# Patient Record
Sex: Female | Born: 1965 | Race: White | Hispanic: No | Marital: Married | Smoking: Former smoker
Health system: Southern US, Community
[De-identification: ages and names within clinical notes are randomized; demographics above are authoritative.]

## PROBLEM LIST (undated history)

## (undated) DIAGNOSIS — I2510 Atherosclerotic heart disease of native coronary artery without angina pectoris: Secondary | ICD-10-CM

## (undated) DIAGNOSIS — B019 Varicella without complication: Secondary | ICD-10-CM

## (undated) DIAGNOSIS — F329 Major depressive disorder, single episode, unspecified: Secondary | ICD-10-CM

## (undated) DIAGNOSIS — R6882 Decreased libido: Secondary | ICD-10-CM

## (undated) DIAGNOSIS — E041 Nontoxic single thyroid nodule: Secondary | ICD-10-CM

## (undated) DIAGNOSIS — C4490 Unspecified malignant neoplasm of skin, unspecified: Secondary | ICD-10-CM

## (undated) DIAGNOSIS — T8859XA Other complications of anesthesia, initial encounter: Secondary | ICD-10-CM

## (undated) DIAGNOSIS — N83202 Unspecified ovarian cyst, left side: Secondary | ICD-10-CM

## (undated) DIAGNOSIS — F419 Anxiety disorder, unspecified: Secondary | ICD-10-CM

## (undated) DIAGNOSIS — E785 Hyperlipidemia, unspecified: Secondary | ICD-10-CM

## (undated) DIAGNOSIS — K219 Gastro-esophageal reflux disease without esophagitis: Secondary | ICD-10-CM

## (undated) DIAGNOSIS — R87810 Cervical high risk human papillomavirus (HPV) DNA test positive: Secondary | ICD-10-CM

## (undated) DIAGNOSIS — G8929 Other chronic pain: Secondary | ICD-10-CM

## (undated) DIAGNOSIS — R87610 Atypical squamous cells of undetermined significance on cytologic smear of cervix (ASC-US): Secondary | ICD-10-CM

## (undated) DIAGNOSIS — Z8719 Personal history of other diseases of the digestive system: Secondary | ICD-10-CM

## (undated) DIAGNOSIS — F32A Depression, unspecified: Secondary | ICD-10-CM

## (undated) HISTORY — PX: WISDOM TOOTH EXTRACTION: SHX21

## (undated) HISTORY — DX: Major depressive disorder, single episode, unspecified: F32.9

## (undated) HISTORY — DX: Decreased libido: R68.82

## (undated) HISTORY — DX: Atypical squamous cells of undetermined significance on cytologic smear of cervix (ASC-US): R87.810

## (undated) HISTORY — DX: Atypical squamous cells of undetermined significance on cytologic smear of cervix (ASC-US): R87.610

## (undated) HISTORY — DX: Nontoxic single thyroid nodule: E04.1

## (undated) HISTORY — PX: BLADDER SURGERY: SHX569

## (undated) HISTORY — DX: Varicella without complication: B01.9

## (undated) HISTORY — DX: Other chronic pain: G89.29

## (undated) HISTORY — DX: Anxiety disorder, unspecified: F41.9

## (undated) HISTORY — PX: OOPHORECTOMY: SHX86

## (undated) HISTORY — PX: ABDOMINAL HYSTERECTOMY: SHX81

## (undated) HISTORY — DX: Gastro-esophageal reflux disease without esophagitis: K21.9

## (undated) HISTORY — DX: Unspecified ovarian cyst, left side: N83.202

## (undated) HISTORY — DX: Atherosclerotic heart disease of native coronary artery without angina pectoris: I25.10

## (undated) HISTORY — PX: COLONOSCOPY: SHX174

## (undated) HISTORY — DX: Depression, unspecified: F32.A

## (undated) HISTORY — DX: Personal history of other diseases of the digestive system: Z87.19

## (undated) HISTORY — DX: Unspecified malignant neoplasm of skin, unspecified: C44.90

## (undated) HISTORY — DX: Hyperlipidemia, unspecified: E78.5

## (undated) HISTORY — PX: BREAST BIOPSY: SHX20

---

## 2002-07-31 HISTORY — PX: APPENDECTOMY: SHX54

## 2004-07-27 ENCOUNTER — Ambulatory Visit: Payer: Self-pay | Admitting: Internal Medicine

## 2004-09-05 ENCOUNTER — Ambulatory Visit: Payer: Self-pay | Admitting: Internal Medicine

## 2004-09-16 ENCOUNTER — Ambulatory Visit: Payer: Self-pay | Admitting: Internal Medicine

## 2005-03-16 ENCOUNTER — Ambulatory Visit: Payer: Self-pay | Admitting: Unknown Physician Specialty

## 2005-04-20 ENCOUNTER — Ambulatory Visit: Payer: Self-pay | Admitting: Unknown Physician Specialty

## 2005-05-04 ENCOUNTER — Encounter: Payer: Self-pay | Admitting: Unknown Physician Specialty

## 2005-05-31 ENCOUNTER — Encounter: Payer: Self-pay | Admitting: Unknown Physician Specialty

## 2005-06-16 ENCOUNTER — Encounter: Payer: Self-pay | Admitting: Unknown Physician Specialty

## 2005-06-30 ENCOUNTER — Encounter: Payer: Self-pay | Admitting: Unknown Physician Specialty

## 2005-07-31 ENCOUNTER — Encounter: Payer: Self-pay | Admitting: Unknown Physician Specialty

## 2005-08-30 ENCOUNTER — Ambulatory Visit: Payer: Self-pay | Admitting: Unknown Physician Specialty

## 2005-08-31 ENCOUNTER — Encounter: Payer: Self-pay | Admitting: Unknown Physician Specialty

## 2005-09-28 ENCOUNTER — Encounter: Payer: Self-pay | Admitting: Unknown Physician Specialty

## 2006-01-11 ENCOUNTER — Ambulatory Visit: Payer: Self-pay | Admitting: Urology

## 2006-02-01 ENCOUNTER — Ambulatory Visit: Payer: Self-pay | Admitting: Unknown Physician Specialty

## 2006-02-12 ENCOUNTER — Ambulatory Visit: Payer: Self-pay | Admitting: Unknown Physician Specialty

## 2006-06-10 ENCOUNTER — Other Ambulatory Visit: Payer: Self-pay

## 2006-06-10 ENCOUNTER — Inpatient Hospital Stay: Payer: Self-pay | Admitting: Internal Medicine

## 2006-07-15 ENCOUNTER — Ambulatory Visit: Payer: Self-pay | Admitting: Internal Medicine

## 2006-10-04 ENCOUNTER — Encounter: Payer: Self-pay | Admitting: Unknown Physician Specialty

## 2006-10-30 ENCOUNTER — Encounter: Payer: Self-pay | Admitting: Unknown Physician Specialty

## 2006-11-20 ENCOUNTER — Ambulatory Visit: Payer: Self-pay | Admitting: Pain Medicine

## 2006-11-26 ENCOUNTER — Ambulatory Visit: Payer: Self-pay | Admitting: Pain Medicine

## 2006-11-29 ENCOUNTER — Encounter: Payer: Self-pay | Admitting: Unknown Physician Specialty

## 2006-12-25 ENCOUNTER — Ambulatory Visit: Payer: Self-pay | Admitting: Pain Medicine

## 2006-12-31 ENCOUNTER — Ambulatory Visit: Payer: Self-pay | Admitting: Pain Medicine

## 2007-01-31 ENCOUNTER — Ambulatory Visit: Payer: Self-pay | Admitting: Pain Medicine

## 2007-02-04 ENCOUNTER — Ambulatory Visit: Payer: Self-pay | Admitting: Pain Medicine

## 2007-02-22 ENCOUNTER — Emergency Department: Payer: Self-pay | Admitting: Emergency Medicine

## 2007-03-07 ENCOUNTER — Ambulatory Visit: Payer: Self-pay | Admitting: Unknown Physician Specialty

## 2007-03-08 ENCOUNTER — Ambulatory Visit: Payer: Self-pay | Admitting: Unknown Physician Specialty

## 2007-03-14 ENCOUNTER — Ambulatory Visit: Payer: Self-pay | Admitting: Pain Medicine

## 2007-03-18 ENCOUNTER — Ambulatory Visit: Payer: Self-pay | Admitting: Pain Medicine

## 2008-04-14 ENCOUNTER — Ambulatory Visit: Payer: Self-pay | Admitting: Unknown Physician Specialty

## 2008-04-17 ENCOUNTER — Ambulatory Visit: Payer: Self-pay | Admitting: Unknown Physician Specialty

## 2008-11-17 ENCOUNTER — Ambulatory Visit: Payer: Self-pay | Admitting: Unknown Physician Specialty

## 2009-04-01 ENCOUNTER — Ambulatory Visit: Payer: Self-pay | Admitting: Pain Medicine

## 2009-04-12 ENCOUNTER — Ambulatory Visit: Payer: Self-pay | Admitting: Pain Medicine

## 2009-05-13 ENCOUNTER — Ambulatory Visit: Payer: Self-pay | Admitting: Pain Medicine

## 2009-05-17 ENCOUNTER — Ambulatory Visit: Payer: Self-pay | Admitting: Pain Medicine

## 2009-06-10 ENCOUNTER — Ambulatory Visit: Payer: Self-pay | Admitting: Pain Medicine

## 2009-06-14 ENCOUNTER — Ambulatory Visit: Payer: Self-pay | Admitting: Internal Medicine

## 2009-06-16 ENCOUNTER — Ambulatory Visit: Payer: Self-pay | Admitting: Unknown Physician Specialty

## 2009-06-18 ENCOUNTER — Ambulatory Visit: Payer: Self-pay | Admitting: Unknown Physician Specialty

## 2009-06-30 ENCOUNTER — Encounter: Admission: RE | Admit: 2009-06-30 | Discharge: 2009-06-30 | Payer: Self-pay | Admitting: Unknown Physician Specialty

## 2009-06-30 ENCOUNTER — Ambulatory Visit: Payer: Self-pay | Admitting: Pain Medicine

## 2009-07-06 ENCOUNTER — Encounter: Admission: RE | Admit: 2009-07-06 | Discharge: 2009-07-06 | Payer: Self-pay | Admitting: Unknown Physician Specialty

## 2009-07-14 ENCOUNTER — Encounter: Admission: RE | Admit: 2009-07-14 | Discharge: 2009-07-14 | Payer: Self-pay | Admitting: Unknown Physician Specialty

## 2009-08-09 ENCOUNTER — Ambulatory Visit: Payer: Self-pay | Admitting: Pain Medicine

## 2009-09-02 ENCOUNTER — Ambulatory Visit: Payer: Self-pay | Admitting: Pain Medicine

## 2009-09-08 ENCOUNTER — Ambulatory Visit: Payer: Self-pay | Admitting: Pain Medicine

## 2009-10-05 ENCOUNTER — Ambulatory Visit: Payer: Self-pay | Admitting: Pain Medicine

## 2009-11-04 ENCOUNTER — Ambulatory Visit: Payer: Self-pay | Admitting: Pain Medicine

## 2009-11-24 ENCOUNTER — Ambulatory Visit: Payer: Self-pay | Admitting: Pain Medicine

## 2009-12-23 ENCOUNTER — Ambulatory Visit: Payer: Self-pay | Admitting: Pain Medicine

## 2010-02-01 ENCOUNTER — Ambulatory Visit: Payer: Self-pay | Admitting: Pain Medicine

## 2010-02-21 ENCOUNTER — Ambulatory Visit: Payer: Self-pay | Admitting: Pain Medicine

## 2010-03-24 ENCOUNTER — Ambulatory Visit: Payer: Self-pay | Admitting: Pain Medicine

## 2010-04-18 ENCOUNTER — Ambulatory Visit: Payer: Self-pay | Admitting: Pain Medicine

## 2010-05-31 ENCOUNTER — Ambulatory Visit: Payer: Self-pay | Admitting: Pain Medicine

## 2010-06-20 ENCOUNTER — Ambulatory Visit: Payer: Self-pay | Admitting: Unknown Physician Specialty

## 2010-07-20 ENCOUNTER — Ambulatory Visit: Payer: Self-pay | Admitting: Pain Medicine

## 2011-07-18 ENCOUNTER — Ambulatory Visit: Payer: Self-pay | Admitting: Internal Medicine

## 2011-07-19 ENCOUNTER — Ambulatory Visit: Payer: Self-pay | Admitting: Internal Medicine

## 2011-07-26 ENCOUNTER — Other Ambulatory Visit: Payer: Self-pay | Admitting: Internal Medicine

## 2011-07-28 ENCOUNTER — Other Ambulatory Visit: Payer: Self-pay | Admitting: Internal Medicine

## 2011-07-28 DIAGNOSIS — R928 Other abnormal and inconclusive findings on diagnostic imaging of breast: Secondary | ICD-10-CM

## 2011-08-07 ENCOUNTER — Ambulatory Visit
Admission: RE | Admit: 2011-08-07 | Discharge: 2011-08-07 | Disposition: A | Discharge status: Home / Self Care | Payer: 59 | Source: Ambulatory Visit | Attending: Internal Medicine | Admitting: Internal Medicine

## 2011-08-07 ENCOUNTER — Other Ambulatory Visit: Payer: Self-pay | Admitting: Internal Medicine

## 2011-08-07 DIAGNOSIS — R928 Other abnormal and inconclusive findings on diagnostic imaging of breast: Secondary | ICD-10-CM

## 2011-11-07 ENCOUNTER — Encounter: Payer: Self-pay | Admitting: Anesthesiology

## 2011-11-29 ENCOUNTER — Encounter: Payer: Self-pay | Admitting: Anesthesiology

## 2012-01-23 DIAGNOSIS — K5090 Crohn's disease, unspecified, without complications: Secondary | ICD-10-CM | POA: Insufficient documentation

## 2012-07-12 DIAGNOSIS — M25559 Pain in unspecified hip: Secondary | ICD-10-CM | POA: Insufficient documentation

## 2012-07-12 DIAGNOSIS — M7150 Other bursitis, not elsewhere classified, unspecified site: Secondary | ICD-10-CM | POA: Insufficient documentation

## 2012-07-12 HISTORY — DX: Pain in unspecified hip: M25.559

## 2012-07-22 ENCOUNTER — Emergency Department: Payer: Self-pay | Admitting: Emergency Medicine

## 2012-07-22 LAB — ACETAMINOPHEN LEVEL: Acetaminophen: 6 ug/mL — ABNORMAL LOW

## 2012-07-22 LAB — CBC WITH DIFFERENTIAL/PLATELET
Basophil #: 0 10*3/uL (ref 0.0–0.1)
Basophil %: 0.4 %
Eosinophil %: 0.9 %
HCT: 40.1 % (ref 35.0–47.0)
HGB: 13.2 g/dL (ref 12.0–16.0)
Lymphocyte #: 1.4 10*3/uL (ref 1.0–3.6)
Lymphocyte %: 17.5 %
MCV: 91 fL (ref 80–100)
Monocyte %: 7.6 %
Neutrophil #: 5.7 10*3/uL (ref 1.4–6.5)
RDW: 13.1 % (ref 11.5–14.5)
WBC: 7.8 10*3/uL (ref 3.6–11.0)

## 2012-07-22 LAB — COMPREHENSIVE METABOLIC PANEL
Albumin: 4.1 g/dL (ref 3.4–5.0)
Alkaline Phosphatase: 77 U/L (ref 50–136)
Anion Gap: 7 (ref 7–16)
BUN: 14 mg/dL (ref 7–18)
Calcium, Total: 8.9 mg/dL (ref 8.5–10.1)
EGFR (Non-African Amer.): >60
Glucose: 96 mg/dL (ref 65–99)
Osmolality: 274 (ref 275–301)
Potassium: 3.8 mmol/L (ref 3.5–5.1)
SGOT(AST): 27 U/L (ref 15–37)
Sodium: 137 mmol/L (ref 136–145)

## 2012-07-22 LAB — SALICYLATE LEVEL: Salicylates, Serum: <1.7 mg/dL

## 2012-07-22 LAB — DRUG SCREEN, URINE
Barbiturates, Ur Screen: NEGATIVE (ref ?–200)
Cannabinoid 50 Ng, Ur ~~LOC~~: NEGATIVE (ref ?–50)
Cocaine Metabolite,Ur ~~LOC~~: NEGATIVE (ref ?–300)
Opiate, Ur Screen: POSITIVE (ref ?–300)
Phencyclidine (PCP) Ur S: NEGATIVE (ref ?–25)
Tricyclic, Ur Screen: NEGATIVE (ref ?–1000)

## 2012-07-22 LAB — ETHANOL: Ethanol: <3 mg/dL

## 2012-08-12 ENCOUNTER — Other Ambulatory Visit: Payer: Self-pay | Admitting: Internal Medicine

## 2012-08-12 DIAGNOSIS — Z1231 Encounter for screening mammogram for malignant neoplasm of breast: Secondary | ICD-10-CM

## 2012-08-22 ENCOUNTER — Ambulatory Visit: Payer: Self-pay | Admitting: Cardiology

## 2012-09-05 ENCOUNTER — Ambulatory Visit
Admission: RE | Admit: 2012-09-05 | Discharge: 2012-09-05 | Disposition: A | Discharge status: Home / Self Care | Payer: 59 | Source: Ambulatory Visit | Attending: Internal Medicine | Admitting: Internal Medicine

## 2012-09-05 DIAGNOSIS — Z1231 Encounter for screening mammogram for malignant neoplasm of breast: Secondary | ICD-10-CM

## 2012-09-25 ENCOUNTER — Ambulatory Visit: Payer: Self-pay | Admitting: Internal Medicine

## 2012-09-26 ENCOUNTER — Ambulatory Visit: Payer: Self-pay | Admitting: Internal Medicine

## 2012-10-22 ENCOUNTER — Ambulatory Visit: Payer: Self-pay | Admitting: Internal Medicine

## 2013-07-18 DIAGNOSIS — G8929 Other chronic pain: Secondary | ICD-10-CM | POA: Insufficient documentation

## 2013-07-18 DIAGNOSIS — M533 Sacrococcygeal disorders, not elsewhere classified: Secondary | ICD-10-CM | POA: Insufficient documentation

## 2013-12-12 ENCOUNTER — Other Ambulatory Visit: Payer: Self-pay

## 2013-12-12 DIAGNOSIS — Z1231 Encounter for screening mammogram for malignant neoplasm of breast: Secondary | ICD-10-CM

## 2013-12-29 ENCOUNTER — Encounter (INDEPENDENT_AMBULATORY_CARE_PROVIDER_SITE_OTHER): Payer: Self-pay

## 2013-12-29 ENCOUNTER — Ambulatory Visit
Admission: RE | Admit: 2013-12-29 | Discharge: 2013-12-29 | Disposition: A | Discharge status: Home / Self Care | Payer: 59 | Source: Ambulatory Visit

## 2013-12-29 DIAGNOSIS — Z1231 Encounter for screening mammogram for malignant neoplasm of breast: Secondary | ICD-10-CM

## 2014-02-25 ENCOUNTER — Ambulatory Visit: Payer: Self-pay | Admitting: Unknown Physician Specialty

## 2014-03-30 DIAGNOSIS — Z8371 Family history of colonic polyps: Secondary | ICD-10-CM | POA: Insufficient documentation

## 2014-03-30 DIAGNOSIS — K5000 Crohn's disease of small intestine without complications: Secondary | ICD-10-CM | POA: Insufficient documentation

## 2014-05-19 ENCOUNTER — Inpatient Hospital Stay: Payer: Self-pay | Admitting: Internal Medicine

## 2014-05-19 LAB — BASIC METABOLIC PANEL
Anion Gap: 4 — ABNORMAL LOW (ref 7–16)
BUN: 12 mg/dL (ref 7–18)
CALCIUM: 7.2 mg/dL — AB (ref 8.5–10.1)
CHLORIDE: 114 mmol/L — AB (ref 98–107)
CREATININE: 0.79 mg/dL (ref 0.60–1.30)
Co2: 27 mmol/L (ref 21–32)
EGFR (African American): >60
EGFR (Non-African Amer.): >60
GLUCOSE: 90 mg/dL (ref 65–99)
OSMOLALITY: 288 (ref 275–301)
POTASSIUM: 3.8 mmol/L (ref 3.5–5.1)
Sodium: 145 mmol/L (ref 136–145)

## 2014-05-19 LAB — URINALYSIS, COMPLETE
BILIRUBIN, UR: NEGATIVE
Bacteria: NONE SEEN
Blood: NEGATIVE
Glucose,UR: NEGATIVE mg/dL (ref 0–75)
KETONE: NEGATIVE
LEUKOCYTE ESTERASE: NEGATIVE
NITRITE: NEGATIVE
PH: 5 (ref 4.5–8.0)
PROTEIN: NEGATIVE
Specific Gravity: 1.029 (ref 1.003–1.030)
WBC UR: 1 /HPF (ref 0–5)

## 2014-05-19 LAB — CBC WITH DIFFERENTIAL/PLATELET
Basophil #: 0 10*3/uL (ref 0.0–0.1)
Basophil %: 0.2 %
EOS ABS: 0 10*3/uL (ref 0.0–0.7)
Eosinophil %: 0.2 %
HCT: 36.9 % (ref 35.0–47.0)
HGB: 12.1 g/dL (ref 12.0–16.0)
LYMPHS ABS: 1.8 10*3/uL (ref 1.0–3.6)
Lymphocyte %: 12.5 %
MCH: 30.3 pg (ref 26.0–34.0)
MCHC: 32.9 g/dL (ref 32.0–36.0)
MCV: 92 fL (ref 80–100)
MONO ABS: 0.6 x10 3/mm (ref 0.2–0.9)
Monocyte %: 4.3 %
Neutrophil #: 11.8 10*3/uL — ABNORMAL HIGH (ref 1.4–6.5)
Neutrophil %: 82.8 %
Platelet: 261 10*3/uL (ref 150–440)
RBC: 4.01 10*6/uL (ref 3.80–5.20)
RDW: 12.3 % (ref 11.5–14.5)
WBC: 14.3 10*3/uL — ABNORMAL HIGH (ref 3.6–11.0)

## 2014-05-19 LAB — PROTIME-INR
INR: 1.1
Prothrombin Time: 14 secs (ref 11.5–14.7)

## 2014-05-19 LAB — D-DIMER(ARMC): D-DIMER: 512 ng/mL

## 2014-05-19 LAB — TROPONIN I
TROPONIN-I: 6.5 ng/mL — AB
Troponin-I: 2.6 ng/mL — ABNORMAL HIGH
Troponin-I: 6.3 ng/mL — ABNORMAL HIGH

## 2014-05-19 LAB — CK-MB
CK-MB: 12.8 ng/mL — AB (ref 0.5–3.6)
CK-MB: 13.1 ng/mL — ABNORMAL HIGH (ref 0.5–3.6)

## 2014-05-19 LAB — APTT: ACTIVATED PTT: 29.5 s (ref 23.6–35.9)

## 2014-05-19 LAB — CK TOTAL AND CKMB (NOT AT ARMC)
CK, TOTAL: 84 U/L
CK-MB: 4.6 ng/mL — ABNORMAL HIGH (ref 0.5–3.6)

## 2014-05-20 LAB — TROPONIN I: Troponin-I: 4.2 ng/mL — ABNORMAL HIGH

## 2014-05-20 LAB — CBC WITH DIFFERENTIAL/PLATELET
BASOS PCT: 0.3 %
Basophil #: 0 10*3/uL (ref 0.0–0.1)
EOS ABS: 0 10*3/uL (ref 0.0–0.7)
Eosinophil %: 0.4 %
HCT: 36.1 % (ref 35.0–47.0)
HGB: 11.8 g/dL — AB (ref 12.0–16.0)
Lymphocyte #: 2.1 10*3/uL (ref 1.0–3.6)
Lymphocyte %: 18.1 %
MCH: 30.3 pg (ref 26.0–34.0)
MCHC: 32.7 g/dL (ref 32.0–36.0)
MCV: 93 fL (ref 80–100)
MONO ABS: 0.9 x10 3/mm (ref 0.2–0.9)
MONOS PCT: 7.3 %
NEUTROS ABS: 8.6 10*3/uL — AB (ref 1.4–6.5)
Neutrophil %: 73.9 %
PLATELETS: 238 10*3/uL (ref 150–440)
RBC: 3.9 10*6/uL (ref 3.80–5.20)
RDW: 12.8 % (ref 11.5–14.5)
WBC: 11.6 10*3/uL — AB (ref 3.6–11.0)

## 2014-05-20 LAB — LIPID PANEL
CHOLESTEROL: 161 mg/dL (ref 0–200)
HDL: 32 mg/dL — AB (ref 40–60)
Ldl Cholesterol, Calc: 95 mg/dL (ref 0–100)
TRIGLYCERIDES: 171 mg/dL (ref 0–200)
VLDL Cholesterol, Calc: 34 mg/dL (ref 5–40)

## 2014-08-17 DIAGNOSIS — M65911 Unspecified synovitis and tenosynovitis, right shoulder: Secondary | ICD-10-CM

## 2014-08-17 DIAGNOSIS — M65811 Other synovitis and tenosynovitis, right shoulder: Secondary | ICD-10-CM

## 2014-08-17 HISTORY — DX: Other synovitis and tenosynovitis, right shoulder: M65.811

## 2014-08-17 HISTORY — DX: Unspecified synovitis and tenosynovitis, right shoulder: M65.911

## 2014-11-21 NOTE — Discharge Summary (Signed)
PATIENT NAME:  Carolyn Esparza, Carolyn Esparza MR#:  [id number] DATE OF BIRTH:  [birth year]  DATE OF ADMISSION:  05/19/2014 DATE OF DISCHARGE:  05/20/2014  DISCHARGE DIAGNOSES: 1.  Acute non-ST elevation myocardial infarction.  2.  Hyperlipidemia.  3.  Hypotension following colonoscopy.   CHIEF COMPLAINT:  Hypotension.   HOSPITAL COURSE:  Carolyn Esparza is a 49 year old female with a history of hyperlipidemia and previous Crohn disease, who underwent a colonoscopy yesterday, but after colonoscopy, the patient developed significant hypotension with a blood pressure of 80/50 and was given IV fluids. EKG did show ST segment depression of 2 mm. The patient denied any chest pain, palpitations, orthopnea, or PND. With fluids, blood pressure did come up. Subsequent cardiac catheterization done by Dr. 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 Coronary circulation was right dominant. Mid LAD showed luminal irregularities. The mid circumflex showed minor luminal irregularities, and mid ramus intermedius showed minor luminal irregularities. EF by contrast ventriculography was 55%. The patient did have elevated cardiac enzymes with troponin of 2.6, 6.3, and 6.5 respectively. She did have some urinary retention and needed in and out catheterization. However, she remained chest pain-free, blood pressure was stable, and she was discharged in stable condition on the following medications.   DISCHARGE MEDICATIONS:  Pantoprazole 20 mg once a day, Plavix 75 mg once a day, aspirin 325 mg a day, rosuvastatin 10 mg once a day, Wellbutrin XL 150 mg every 24 hours, fentanyl patch at bedtime, Minivelle patch 0.075 mg every 24 hours transdermal 2 times a week on Wednesdays and Saturdays, Colace 100 mg p.o. b.i.d. as needed. The patient is advised to discontinue her Celebrex completely and advised to call back with any questions or concerns.   FOLLOWUP:  She has been advised to follow up with Dr. Laurelyn Sickle office in 1 to 2 weeks' time.    TOTAL TIME SPENT ON DISCHARGING THE PATIENT:  35 minutes.    ____________________________ Tracie Harrier, MD vh:nb D: 05/20/2014 13:24:04 ET T: 05/21/2014 04:54:48 ET JOB#: [id number]  cc: Tracie Harrier, MD, <Dictator> Tracie Harrier MD ELECTRONICALLY SIGNED 05/26/2014 15:58

## 2014-11-21 NOTE — H&P (Signed)
PATIENT NAME:  Carolyn Esparza, Carolyn Esparza MR#:  [id number] DATE OF BIRTH:  07-28-1966  DATE OF ADMISSION:  05/19/2014  PRIMARY CARE PHYSICIAN:  Dr. Ginette Pitman.    REFERRING PHYSICIAN:  Dr. Humphrey Rolls, cardiologist.    CHIEF COMPLAINT:  Hypotension.   HISTORY OF PRESENT ILLNESS: This is a 49 year old Caucasian female with a history of hyperlipidemia, was sent by GI physician due to hypotension. The patient is alert, awake, oriented. According to Dr. Humphrey Rolls and patient, the patient was scheduled to have a colonoscopy today, but after colonoscopy the patient developed hypotension at 80/50. The patient was given IV fluid support. EKG was done which shows ST depression about 2 mm, but the patient denied any chest pain, palpitation. No orthopnea, nocturnal dyspnea. The patient was started on wide open IV fluid and was then transferred to the ED for possible cardiac catheterization.  The patient denies any other symptoms.   PAST MEDICAL HISTORY: Hyperlipidemia.   SOCIAL HISTORY: Quit smoking years ago. No alcohol drinking or illicit drugs.   FAMILY HISTORY: Diabetes and heart disease.   HOME MEDICATIONS: Pantoprazole 20 mg p.o. daily, MiraLax p.o. powder 17 grams p.o. daily, fentanyl patch 25 mcg per hour transdermal film 1 patch every 3 days, Cymbalta 30 mg p.o. b.i.d., Colace 100 mg p.o. b.i.d., Celebrex 200 mg p.o. b.i.d., Percocet 325 mg/10 mg p.o. every 6 hours p.r.n.   REVIEW OF SYSTEMS:    CONSTITUTIONAL: The patient denies any fever or chills. No headache or dizziness. No weakness.  EYES: No double vision or blurry vision.  EARS, NOSE, AND THROAT: No postnasal drip, slurred speech, or dysphagia. No headache, but has had mild dizziness. No weakness.  CARDIOVASCULAR: No chest pain, palpitation, orthopnea, nocturnal dyspnea. No leg edema.  PULMONARY: No cough, sputum, shortness of breath, or hemoptysis.  GASTROINTESTINAL: No abdominal pain, nausea, vomiting, diarrhea. No melena or bloody stool.  GENITOURINARY: No  dysuria, hematuria, or incontinence.  SKIN: No rash or jaundice.  NEUROLOGIC: No syncope, loss of consciousness, or seizure.  ENDOCRINE: No polyuria, polydipsia, heat or cold intolerance.  HEMATOLOGY: No easy bruising or bleeding.   PHYSICAL EXAMINATION:  VITAL SIGNS:  Blood pressure 99/50, pulse 103, respirations 18.  GENERAL: The patient is alert, awake, oriented, in no acute distress.  HEENT: Pupils round, equal, and reactive to light and accommodation. Moist oral mucosa. Clear oropharynx.  NECK: Supple. No JVD or carotid bruits. No lymphadenopathy.  No thyromegaly.  CARDIOVASCULAR: S1, S2 regular rate and rhythm. No murmurs or gallops.  PULMONARY: Bilateral air entry. No wheezing or rales.  No use of accessory muscle to breathe.  ABDOMEN: Soft. No distention or tenderness. No organomegaly. Bowel sounds present.  EXTREMITIES: No edema, clubbing, or cyanosis. No calf tenderness. Bilateral pedal pulses present.  SKIN: No rash or jaundice.  NEUROLOGIC: A and O x 3. No focal defects. Power 5 out of 5. Sensation intact.   LABORATORY DATA: Laboratories were ordered, no laboratories so far.   IMPRESSION:  1.  Hypotension.    2.  Abnormal electrocardiogram with ST depression possibly due to temporary ischemia due to hypotension.  3.  Hyperlipidemia.   PLAN OF TREATMENT:  1.  The patient is going to get cardiac catheterization by Dr. Humphrey Rolls now. We will follow up with Dr. Humphrey Rolls for further recommendation and we will get routine laboratories. We will follow up laboratories including CBC and BMP. In addition we will follow up the troponin level.  2.  I discussed the patient's condition and plan of treatment  with the patient and the patient's husband, in addition discussed with Dr. Humphrey Rolls.    CODE STATUS:  The patient is a full code.   TIME SPENT: About 46 minutes.    ____________________________ Demetrios Loll, MD qc:bu D: 05/19/2014 13:06:20 ET T: 05/19/2014 13:25:33 ET JOB#: [id number]  cc: Demetrios Loll, MD, <Dictator> Demetrios Loll MD ELECTRONICALLY SIGNED 05/19/2014 15:14

## 2014-11-21 NOTE — Consult Note (Signed)
PATIENT NAME:  Carolyn Esparza, Carolyn Esparza MR#:  [id number] DATE OF BIRTH:  April 01, 1966  DATE OF CONSULTATION:  05/19/2014  REFERRING PHYSICIAN:   CONSULTING PHYSICIAN:  Dionisio David, MD  HISTORY OF PRESENT ILLNESS: This is a 49 year old white female who was having colonoscopy at [facility] in [place]. She developed some hypotension post colonoscopy. Blood pressure dropped to 80/50. She was given IV fluids. EKG was done and EKG showed ST depression, about 2 mm, in II, III and aVF as well as V4 to V6 and questionable 0.5 mm ST elevation in I and aVL. She was not having any chest pain. She was started on wide-open IV fluid and transferred to Emergency Room for possible cardiac catheterization. Throughout she remained alert and oriented. There was no chest pain.   PAST MEDICAL HISTORY: History of hyperlipidemia. No history of hypertension or diabetes.   REVIEW OF SYSTEMS: When she was taking GoLYTELY last night for preparation for colonoscopy, she had some nausea and vomiting and some abdominal pain, but no chest pain.   PHYSICAL EXAMINATION: VITAL SIGNS: Right now her blood pressure is 99/50, pulse is 103 and respirations 18.  NECK: No JVD.  LUNGS: Clear.  HEART: Regular rate and rhythm. Normal S1, S2. No audible murmur.  ABDOMEN: Soft, nontender, positive bowel sounds.  EXTREMITIES: No pedal edema.  NEUROLOGIC: She appears to be intact.   DIAGNOSTIC DATA: EKG shows, as mentioned, sinus tachycardia, inferolateral ischemic ST-T changes.   ASSESSMENT AND RECOMMENDATIONS: Advise labs as well as doing cardiac catheterization, labs with troponins and cardiac enzymes. Will discuss with either Dr. Clayborn Bigness or Dr. Saralyn Pilar for possible angioplasty if needed.  ____________________________ Dionisio David, MD sak:sb D: 05/19/2014 11:19:57 ET T: 05/19/2014 11:33:46 ET JOB#: [id number]  cc: Dionisio David, MD, <Dictator> Dionisio David MD ELECTRONICALLY SIGNED 05/22/2014 12:50

## 2015-02-05 DIAGNOSIS — I2510 Atherosclerotic heart disease of native coronary artery without angina pectoris: Secondary | ICD-10-CM | POA: Insufficient documentation

## 2015-03-25 DIAGNOSIS — Z0289 Encounter for other administrative examinations: Secondary | ICD-10-CM | POA: Insufficient documentation

## 2015-05-26 LAB — HM MAMMOGRAPHY

## 2015-12-01 ENCOUNTER — Ambulatory Visit: Payer: Self-pay | Admitting: Family Medicine

## 2015-12-09 ENCOUNTER — Ambulatory Visit: Payer: Self-pay | Admitting: Family Medicine

## 2015-12-13 ENCOUNTER — Encounter: Payer: Self-pay | Admitting: Family Medicine

## 2015-12-13 ENCOUNTER — Ambulatory Visit (INDEPENDENT_AMBULATORY_CARE_PROVIDER_SITE_OTHER): Payer: 59 | Admitting: Family Medicine

## 2015-12-13 VITALS — BP 122/84 | HR 75 | Temp 98.1°F | Ht 62.5 in | Wt 156.0 lb

## 2015-12-13 DIAGNOSIS — G8929 Other chronic pain: Secondary | ICD-10-CM

## 2015-12-13 DIAGNOSIS — R6889 Other general symptoms and signs: Secondary | ICD-10-CM | POA: Diagnosis not present

## 2015-12-13 DIAGNOSIS — E785 Hyperlipidemia, unspecified: Secondary | ICD-10-CM

## 2015-12-13 DIAGNOSIS — F329 Major depressive disorder, single episode, unspecified: Secondary | ICD-10-CM

## 2015-12-13 DIAGNOSIS — I2510 Atherosclerotic heart disease of native coronary artery without angina pectoris: Secondary | ICD-10-CM | POA: Diagnosis not present

## 2015-12-13 DIAGNOSIS — Z0001 Encounter for general adult medical examination with abnormal findings: Secondary | ICD-10-CM | POA: Diagnosis not present

## 2015-12-13 DIAGNOSIS — F32A Depression, unspecified: Secondary | ICD-10-CM

## 2015-12-13 NOTE — Patient Instructions (Signed)
It was nice to see you today.  Consider aspirin 81 mg daily (enteric coated).  Consider taking your lipitor daily.  Continue all of your other medications.   Follow up:  6 months - 1 year.  Take care  Dr. Lacinda Axon  Health Maintenance, Female Adopting a healthy lifestyle and getting preventive care can go a long way to promote health and wellness. Talk with your health care provider about what schedule of regular examinations is right for you. This is a good chance for you to check in with your provider about disease prevention and staying healthy. In between checkups, there are plenty of things you can do on your own. Experts have done a lot of research about which lifestyle changes and preventive measures are most likely to keep you healthy. Ask your health care provider for more information. WEIGHT AND DIET  Eat a healthy diet  Be sure to include plenty of vegetables, fruits, low-fat dairy products, and lean protein.  Do not eat a lot of foods high in solid fats, added sugars, or salt.  Get regular exercise. This is one of the most important things you can do for your health.  Most adults should exercise for at least 150 minutes each week. The exercise should increase your heart rate and make you sweat (moderate-intensity exercise).  Most adults should also do strengthening exercises at least twice a week. This is in addition to the moderate-intensity exercise.  Maintain a healthy weight  Body mass index (BMI) is a measurement that can be used to identify possible weight problems. It estimates body fat based on height and weight. Your health care provider can help determine your BMI and help you achieve or maintain a healthy weight.  For females 80 years of age and older:   A BMI below 18.5 is considered underweight.  A BMI of 18.5 to 24.9 is normal.  A BMI of 25 to 29.9 is considered overweight.  A BMI of 30 and above is considered obese.  Watch levels of cholesterol and  blood lipids  You should start having your blood tested for lipids and cholesterol at 50 years of age, then have this test every 5 years.  You may need to have your cholesterol levels checked more often if:  Your lipid or cholesterol levels are high.  You are older than 50 years of age.  You are at high risk for heart disease.  CANCER SCREENING   Lung Cancer  Lung cancer screening is recommended for adults 12-33 years old who are at high risk for lung cancer because of a history of smoking.  A yearly low-dose CT scan of the lungs is recommended for people who:  Currently smoke.  Have quit within the past 15 years.  Have at least a 30-pack-year history of smoking. A pack year is smoking an average of one pack of cigarettes a day for 1 year.  Yearly screening should continue until it has been 15 years since you quit.  Yearly screening should stop if you develop a health problem that would prevent you from having lung cancer treatment.  Breast Cancer  Practice breast self-awareness. This means understanding how your breasts normally appear and feel.  It also means doing regular breast self-exams. Let your health care provider know about any changes, no matter how small.  If you are in your 20s or 30s, you should have a clinical breast exam (CBE) by a health care provider every 1-3 years as part of a regular  health exam.  If you are 40 or older, have a CBE every year. Also consider having a breast X-ray (mammogram) every year.  If you have a family history of breast cancer, talk to your health care provider about genetic screening.  If you are at high risk for breast cancer, talk to your health care provider about having an MRI and a mammogram every year.  Breast cancer gene (BRCA) assessment is recommended for women who have family members with BRCA-related cancers. BRCA-related cancers include:  Breast.  Ovarian.  Tubal.  Peritoneal cancers.  Results of the  assessment will determine the need for genetic counseling and BRCA1 and BRCA2 testing. Cervical Cancer Your health care provider may recommend that you be screened regularly for cancer of the pelvic organs (ovaries, uterus, and vagina). This screening involves a pelvic examination, including checking for microscopic changes to the surface of your cervix (Pap test). You may be encouraged to have this screening done every 3 years, beginning at age 27.  For women ages 70-65, health care providers may recommend pelvic exams and Pap testing every 3 years, or they may recommend the Pap and pelvic exam, combined with testing for human papilloma virus (HPV), every 5 years. Some types of HPV increase your risk of cervical cancer. Testing for HPV may also be done on women of any age with unclear Pap test results.  Other health care providers may not recommend any screening for nonpregnant women who are considered low risk for pelvic cancer and who do not have symptoms. Ask your health care provider if a screening pelvic exam is right for you.  If you have had past treatment for cervical cancer or a condition that could lead to cancer, you need Pap tests and screening for cancer for at least 20 years after your treatment. If Pap tests have been discontinued, your risk factors (such as having a new sexual partner) need to be reassessed to determine if screening should resume. Some women have medical problems that increase the chance of getting cervical cancer. In these cases, your health care provider may recommend more frequent screening and Pap tests. Colorectal Cancer  This type of cancer can be detected and often prevented.  Routine colorectal cancer screening usually begins at 50 years of age and continues through 50 years of age.  Your health care provider may recommend screening at an earlier age if you have risk factors for colon cancer.  Your health care provider may also recommend using home test kits  to check for hidden blood in the stool.  A small camera at the end of a tube can be used to examine your colon directly (sigmoidoscopy or colonoscopy). This is done to check for the earliest forms of colorectal cancer.  Routine screening usually begins at age 78.  Direct examination of the colon should be repeated every 5-10 years through 50 years of age. However, you may need to be screened more often if early forms of precancerous polyps or small growths are found. Skin Cancer  Check your skin from head to toe regularly.  Tell your health care provider about any new moles or changes in moles, especially if there is a change in a mole's shape or color.  Also tell your health care provider if you have a mole that is larger than the size of a pencil eraser.  Always use sunscreen. Apply sunscreen liberally and repeatedly throughout the day.  Protect yourself by wearing long sleeves, pants, a wide-brimmed hat, and  sunglasses whenever you are outside. HEART DISEASE, DIABETES, AND HIGH BLOOD PRESSURE   High blood pressure causes heart disease and increases the risk of stroke. High blood pressure is more likely to develop in:  People who have blood pressure in the high end of the normal range (130-139/85-89 mm Hg).  People who are overweight or obese.  People who are African American.  If you are 71-77 years of age, have your blood pressure checked every 3-5 years. If you are 17 years of age or older, have your blood pressure checked every year. You should have your blood pressure measured twice--once when you are at a hospital or clinic, and once when you are not at a hospital or clinic. Record the average of the two measurements. To check your blood pressure when you are not at a hospital or clinic, you can use:  An automated blood pressure machine at a pharmacy.  A home blood pressure monitor.  If you are between 67 years and 74 years old, ask your health care provider if you should  take aspirin to prevent strokes.  Have regular diabetes screenings. This involves taking a blood sample to check your fasting blood sugar level.  If you are at a normal weight and have a low risk for diabetes, have this test once every three years after 50 years of age.  If you are overweight and have a high risk for diabetes, consider being tested at a younger age or more often. PREVENTING INFECTION  Hepatitis B  If you have a higher risk for hepatitis B, you should be screened for this virus. You are considered at high risk for hepatitis B if:  You were born in a country where hepatitis B is common. Ask your health care provider which countries are considered high risk.  Your parents were born in a high-risk country, and you have not been immunized against hepatitis B (hepatitis B vaccine).  You have HIV or AIDS.  You use needles to inject street drugs.  You live with someone who has hepatitis B.  You have had sex with someone who has hepatitis B.  You get hemodialysis treatment.  You take certain medicines for conditions, including cancer, organ transplantation, and autoimmune conditions. Hepatitis C  Blood testing is recommended for:  Everyone born from [birth year] through 1965.  Anyone with known risk factors for hepatitis C. Sexually transmitted infections (STIs)  You should be screened for sexually transmitted infections (STIs) including gonorrhea and chlamydia if:  You are sexually active and are younger than 50 years of age.  You are older than 50 years of age and your health care provider tells you that you are at risk for this type of infection.  Your sexual activity has changed since you were last screened and you are at an increased risk for chlamydia or gonorrhea. Ask your health care provider if you are at risk.  If you do not have HIV, but are at risk, it may be recommended that you take a prescription medicine daily to prevent HIV infection. This is called  pre-exposure prophylaxis (PrEP). You are considered at risk if:  You are sexually active and do not regularly use condoms or know the HIV status of your partner(s).  You take drugs by injection.  You are sexually active with a partner who has HIV. Talk with your health care provider about whether you are at high risk of being infected with HIV. If you choose to begin PrEP, you should first  be tested for HIV. You should then be tested every 3 months for as long as you are taking PrEP.  PREGNANCY   If you are premenopausal and you may become pregnant, ask your health care provider about preconception counseling.  If you may become pregnant, take 400 to 800 micrograms (mcg) of folic acid every day.  If you want to prevent pregnancy, talk to your health care provider about birth control (contraception). OSTEOPOROSIS AND MENOPAUSE   Osteoporosis is a disease in which the bones lose minerals and strength with aging. This can result in serious bone fractures. Your risk for osteoporosis can be identified using a bone density scan.  If you are 42 years of age or older, or if you are at risk for osteoporosis and fractures, ask your health care provider if you should be screened.  Ask your health care provider whether you should take a calcium or vitamin D supplement to lower your risk for osteoporosis.  Menopause may have certain physical symptoms and risks.  Hormone replacement therapy may reduce some of these symptoms and risks. Talk to your health care provider about whether hormone replacement therapy is right for you.  HOME CARE INSTRUCTIONS   Schedule regular health, dental, and eye exams.  Stay current with your immunizations.   Do not use any tobacco products including cigarettes, chewing tobacco, or electronic cigarettes.  If you are pregnant, do not drink alcohol.  If you are breastfeeding, limit how much and how often you drink alcohol.  Limit alcohol intake to no more than 1  drink per day for nonpregnant women. One drink equals 12 ounces of beer, 5 ounces of wine, or 1 ounces of hard liquor.  Do not use street drugs.  Do not share needles.  Ask your health care provider for help if you need support or information about quitting drugs.  Tell your health care provider if you often feel depressed.  Tell your health care provider if you have ever been abused or do not feel safe at home.   This information is not intended to replace advice given to you by your health care provider. Make sure you discuss any questions you have with your health care provider.   Document Released: 01/30/2011 Document Revised: 08/07/2014 Document Reviewed: 06/18/2013 Elsevier Interactive Patient Education [employer].

## 2015-12-13 NOTE — Progress Notes (Signed)
Pre visit review using our clinic review tool, if applicable. No additional management support is needed unless otherwise documented below in the visit note. 

## 2015-12-15 ENCOUNTER — Encounter: Payer: Self-pay | Admitting: Family Medicine

## 2015-12-15 DIAGNOSIS — E785 Hyperlipidemia, unspecified: Secondary | ICD-10-CM | POA: Insufficient documentation

## 2015-12-15 DIAGNOSIS — Z8669 Personal history of other diseases of the nervous system and sense organs: Secondary | ICD-10-CM | POA: Insufficient documentation

## 2015-12-15 DIAGNOSIS — Z0001 Encounter for general adult medical examination with abnormal findings: Secondary | ICD-10-CM | POA: Insufficient documentation

## 2015-12-15 DIAGNOSIS — E041 Nontoxic single thyroid nodule: Secondary | ICD-10-CM | POA: Insufficient documentation

## 2015-12-15 DIAGNOSIS — K219 Gastro-esophageal reflux disease without esophagitis: Secondary | ICD-10-CM | POA: Insufficient documentation

## 2015-12-15 HISTORY — DX: Personal history of other diseases of the nervous system and sense organs: Z86.69

## 2015-12-15 NOTE — Assessment & Plan Note (Signed)
Advised daily use of Lipitor and consideration for increasing dose.

## 2015-12-15 NOTE — Progress Notes (Signed)
Subjective:  Patient ID: Carolyn Esparza, female    DOB: 10/19/1965  Age: 50 y.o. MRN: [id number]  CC: Establish care  HPI Carolyn Esparza is a 50 y.o. female presents to the clinic today to establish care. No concerns today but wants "a fresh set of eyes" regarding her medical problems.  Preventative Healthcare  Pap smear: Up to date. (03/2015).  Mammogram: Up to date. Will need later this year.   Colonoscopy: Reports that this is also up to date (2015). Has history of crohn's but is not on treatment at this time.   Immunizations  Tetanus - In need of. Declines today.   Pneumococcal - In need of. Declines today.  Flu - N/A at this time.  Zoster - N/A.  Hepatitis C screening - N/A.  Labs: Had labs earlier this year.   Alcohol use: See below.  Smoking/tobacco use: Former smoker.  Wears seat belt: Yes.  Chronic pain  Followed by pain management.  Stable at this time.  CAD  Hx of nonobstructive CAD.  No current therapy other than Lipitor.  Asymptomatic. Stable at this time.  HLD  Last LDL (earlier this year was 104).  On lipitor.  Will discuss increase today.  Depression  Stable on Wellbutrin.  PMH, Surgical Hx, Family Hx, Social History reviewed and updated as below.  Past Medical History  Diagnosis Date  . Depression   . Chicken pox   . CAD (coronary artery disease)   . GERD (gastroesophageal reflux disease)   . Hyperlipidemia   . Thyroid nodule   . History of Crohn's disease    Past Surgical History  Procedure Laterality Date  . Appendectomy  2004  . Bladder surgery    . Abdominal hysterectomy    . Breast biopsy      x 2  . Wisdom tooth extraction     Family History  Problem Relation Age of Onset  . Alcohol abuse Mother   . Hyperlipidemia Mother   . Heart disease Mother   . Hypertension Mother   . Diabetes Mother    Social History  Substance Use Topics  . Smoking status: Former Smoker    Quit date: 08/29/2000  . Smokeless  tobacco: Never Used  . Alcohol Use: No   Review of Systems  Gastrointestinal: Positive for nausea, abdominal pain and constipation.  Endocrine:       Increased thirst.   Genitourinary: Positive for frequency.  Skin:       Changing mole; followed by Derm.  Psychiatric/Behavioral:       Sadness/anxiety.  All other systems reviewed and are negative.  Objective:   Today's Vitals: BP 122/84 mmHg  Pulse 75  Temp(Src) 98.1 F (36.7 C) (Oral)  Ht 5' 2.5" (1.588 m)  Wt 156 lb (70.761 kg)  BMI 28.06 kg/m2  SpO2 97%  Physical Exam  Constitutional: She is oriented to person, place, and time. She appears well-developed and well-nourished. No distress.  HENT:  Head: Normocephalic and atraumatic.  Nose: Nose normal.  Mouth/Throat: Oropharynx is clear and moist. No oropharyngeal exudate.  Normal TM's bilaterally.   Eyes: Conjunctivae are normal. No scleral icterus.  Neck: Neck supple. No thyromegaly present.  Cardiovascular: Normal rate and regular rhythm.   No murmur heard. Pulmonary/Chest: Effort normal and breath sounds normal. She has no wheezes. She has no rales.  Abdominal: Soft. She exhibits no distension. There is no tenderness. There is no rebound and no guarding.  Musculoskeletal: Normal range of motion. She  exhibits no edema.  Lymphadenopathy:    She has no cervical adenopathy.  Neurological: She is alert and oriented to person, place, and time.  Skin: Skin is warm and dry. No rash noted.  Psychiatric: She has a normal mood and affect.  Vitals reviewed.  Assessment & Plan:   Problem List Items Addressed This Visit    Hyperlipidemia    Advised daily use of Lipitor and consideration for increasing dose.      Relevant Medications   atorvastatin (LIPITOR) 10 MG tablet   Encounter for preventative adult health care exam with abnormal findings - Primary    Declines immunizations today.  Pap  Smear and mammogram up-to-date. History of crohn's; needs to see GI for regular  colonoscopy screening. Has had recent labs.       Depression    Stable on Wellbutrin.      Relevant Medications   buPROPion (WELLBUTRIN XL) 300 MG 24 hr tablet   Chronic pain    Stable. Followed by pain management.      Relevant Medications   buPROPion (WELLBUTRIN XL) 300 MG 24 hr tablet   fentaNYL (DURAGESIC - DOSED MCG/HR) 25 MCG/HR patch   HYDROcodone-acetaminophen (NORCO) 10-325 MG tablet   CAD in native artery    History of nonobstructive CAD. Advised daily aspirin. Advised her to take her Lipitor daily. Advised to consider increase to 40 mg.      Relevant Medications   atorvastatin (LIPITOR) 10 MG tablet      Outpatient Encounter Prescriptions as of 12/13/2015  Medication Sig  . Ascorbic Acid (VITAMIN C) 1000 MG tablet Take by mouth.  Marland Kitchen atorvastatin (LIPITOR) 10 MG tablet   . buPROPion (WELLBUTRIN XL) 300 MG 24 hr tablet   . docusate sodium (DOK) 100 MG capsule   . estradiol (CLIMARA - DOSED IN MG/24 HR) 0.05 mg/24hr patch   . fentaNYL (DURAGESIC - DOSED MCG/HR) 25 MCG/HR patch Place onto the skin.  Marland Kitchen HYDROcodone-acetaminophen (NORCO) 10-325 MG tablet   . lidocaine (LIDODERM) 5 % Place onto the skin.  . Multiple Vitamin (MULTI-VITAMINS) TABS Take by mouth.  . Omega-3 Fatty Acids (FISH OIL PO) Take by mouth.  Marland Kitchen omeprazole (PRILOSEC) 20 MG capsule   . promethazine (PHENERGAN) 25 MG tablet   . [DISCONTINUED] omeprazole (PRILOSEC) 20 MG capsule Take by mouth.   No facility-administered encounter medications on file as of 12/13/2015.    Follow-up: 6 months - 1 year  [place]

## 2015-12-15 NOTE — Assessment & Plan Note (Signed)
Stable on Wellbutrin

## 2015-12-15 NOTE — Assessment & Plan Note (Signed)
Declines immunizations today.  Pap  Smear and mammogram up-to-date. History of crohn's; needs to see GI for regular colonoscopy screening. Has had recent labs.

## 2015-12-15 NOTE — Assessment & Plan Note (Signed)
Stable. Followed by pain management.

## 2015-12-15 NOTE — Assessment & Plan Note (Signed)
History of nonobstructive CAD. Advised daily aspirin. Advised her to take her Lipitor daily. Advised to consider increase to 40 mg.

## 2015-12-28 ENCOUNTER — Ambulatory Visit (INDEPENDENT_AMBULATORY_CARE_PROVIDER_SITE_OTHER): Payer: 59 | Admitting: Family Medicine

## 2015-12-28 ENCOUNTER — Encounter: Payer: Self-pay | Admitting: Family Medicine

## 2015-12-28 VITALS — BP 112/76 | HR 67 | Temp 98.0°F | Ht 62.5 in | Wt 155.4 lb

## 2015-12-28 DIAGNOSIS — F419 Anxiety disorder, unspecified: Secondary | ICD-10-CM

## 2015-12-28 DIAGNOSIS — F329 Major depressive disorder, single episode, unspecified: Secondary | ICD-10-CM | POA: Insufficient documentation

## 2015-12-28 DIAGNOSIS — F418 Other specified anxiety disorders: Secondary | ICD-10-CM | POA: Diagnosis not present

## 2015-12-28 DIAGNOSIS — F32A Depression, unspecified: Secondary | ICD-10-CM

## 2015-12-28 MED ORDER — DULOXETINE HCL 30 MG PO CPEP: 30.0000 mg | ORAL_CAPSULE | Freq: Every day | ORAL | Status: DC

## 2015-12-28 NOTE — Progress Notes (Signed)
Pre visit review using our clinic review tool, if applicable. No additional management support is needed unless otherwise documented below in the visit note. 

## 2015-12-28 NOTE — Progress Notes (Signed)
Subjective:  Patient ID: Carolyn Esparza, female    DOB: [birth year]  Age: 50 y.o. MRN: [id number]  CC: Side effect from Wellbutrin  HPI:  50 year old female with depression/anxiety presents with the above complaints.  Patient states that she is currently taking 450 mg of Wellbutrin. She states that she often feels jittery and anxious. She also states that she feels nervous/irritable. When she decreased the dose substantially she experiences symptoms of depression (particularly mood fluctuation/crying). She would like to discuss changes in her therapy at this time. No known new stressors or exacerbating factors. She states that she's been on numerous medications in the past: Lexapro, Cymbalta, Prozac, Paxil. She states that she did not do well on Lexapro. She does not remember how well she did on Prozac or Paxil. She states that she did well on Cymbalta particularly because it aided her pain but she gained a significant amount of weight. Patient states that she is sure that this is a medication side effect.  Social Hx   Social History   Social History  . Marital Status: Married    Spouse Name: N/A  . Number of Children: N/A  . Years of Education: N/A   Social History Main Topics  . Smoking status: Former Smoker    Quit date: 08/29/2000  . Smokeless tobacco: Never Used  . Alcohol Use: No  . Drug Use: No  . Sexual Activity: Not Asked   Other Topics Concern  . None   Social History Narrative   Review of Systems  Constitutional: Negative.   Psychiatric/Behavioral: The patient is nervous/anxious.    Objective:  BP 112/76 mmHg  Pulse 67  Temp(Src) 98 F (36.7 C) (Oral)  Ht 5' 2.5" (1.588 m)  Wt 155 lb 6 oz (70.478 kg)  BMI 27.95 kg/m2  SpO2 97%  BP/Weight 12/28/2015 [id number]  Systolic BP [id number]  Diastolic BP 76 84  Wt. (Lbs) 155.38 156  BMI 27.95 28.06   Physical Exam  Constitutional: She is oriented to person, place, and time. She appears well-developed. No  distress.  HENT:  Head: Normocephalic and atraumatic.  Pulmonary/Chest: Effort normal.  Neurological: She is alert and oriented to person, place, and time.  Psychiatric:  Normal mood and affect.  Vitals reviewed.  Lab Results  Component Value Date   WBC 11.6* 05/20/2014   HGB 11.8* 05/20/2014   HCT 36.1 05/20/2014   PLT 238 05/20/2014   GLUCOSE 90 05/19/2014   CHOL 161 05/20/2014   TRIG 171 05/20/2014   HDL 32* 05/20/2014   LDLCALC 95 05/20/2014   ALT 29 07/22/2012   AST 27 07/22/2012   NA 145 05/19/2014   K 3.8 05/19/2014   CL 114* 05/19/2014   CREATININE 0.79 05/19/2014   BUN 12 05/19/2014   CO2 27 05/19/2014   INR 1.1 05/19/2014   Assessment & Plan:   Problem List Items Addressed This Visit    Anxiety and depression - Primary    Established problem, worsening. Patient not doing well on Wellbutrin at this time. Had a long discussion about therapeutic options. We elected to decrease the dose of Wellbutrin and add Cymbalta (low dose). Follow up in 6 weeks.         Meds ordered this encounter  Medications  . DULoxetine (CYMBALTA) 30 MG capsule    Sig: Take 1 capsule (30 mg total) by mouth daily.    Dispense:  30 capsule    Refill:  3    Follow-up: Return  in about 6 weeks (around 02/08/2016).  Between

## 2015-12-28 NOTE — Assessment & Plan Note (Signed)
Established problem, worsening. Patient not doing well on Wellbutrin at this time. Had a long discussion about therapeutic options. We elected to decrease the dose of Wellbutrin and add Cymbalta (low dose). Follow up in 6 weeks.

## 2015-12-28 NOTE — Patient Instructions (Signed)
Start the cymbalta.  Continue the Wellbutrin (at decreased dose of 300 mg daily).  Follow-up in 6 weeks.  Take care  Dr. Lacinda Axon

## 2015-12-29 ENCOUNTER — Other Ambulatory Visit: Payer: Self-pay | Admitting: Obstetrics & Gynecology

## 2016-02-08 ENCOUNTER — Ambulatory Visit (INDEPENDENT_AMBULATORY_CARE_PROVIDER_SITE_OTHER): Payer: 59 | Admitting: Family Medicine

## 2016-02-08 ENCOUNTER — Encounter: Payer: Self-pay | Admitting: Family Medicine

## 2016-02-08 VITALS — BP 136/76 | HR 73 | Temp 98.3°F | Wt 156.0 lb

## 2016-02-08 DIAGNOSIS — F419 Anxiety disorder, unspecified: Secondary | ICD-10-CM

## 2016-02-08 DIAGNOSIS — F32A Depression, unspecified: Secondary | ICD-10-CM

## 2016-02-08 DIAGNOSIS — F329 Major depressive disorder, single episode, unspecified: Secondary | ICD-10-CM

## 2016-02-08 DIAGNOSIS — F418 Other specified anxiety disorders: Secondary | ICD-10-CM

## 2016-02-08 MED ORDER — PROMETHAZINE HCL 25 MG PO TABS: 25.0000 mg | ORAL_TABLET | Freq: Three times a day (TID) | ORAL | Status: DC | PRN

## 2016-02-08 MED ORDER — OMEPRAZOLE 20 MG PO CPDR: 20.0000 mg | DELAYED_RELEASE_CAPSULE | Freq: Every day | ORAL | Status: DC

## 2016-02-08 MED ORDER — DULOXETINE HCL 20 MG PO CPEP: 20.0000 mg | ORAL_CAPSULE | Freq: Every day | ORAL | Status: DC

## 2016-02-08 NOTE — Progress Notes (Signed)
Subjective:  Patient ID: Carolyn Esparza, female    DOB: 02-04-1966  Age: 50 y.o. MRN: [id number]  CC: Follow up anxiety and depression  HPI:  50 year old female presents for follow-up regarding anxiety and depression.  At our last visit, we decreased the dose of Wellbutrin and added Cymbalta. Patient presents today for follow-up. Patient states she's doing well. She's noticed a significant improvement in her anxiety is well controlled. She would like to discuss decreasing the Cymbalta dose as she had weight gain with this in the past. No reports of changes in mood. No other complaints this time.  Social Hx   Social History   Social History  . Marital Status: Married    Spouse Name: N/A  . Number of Children: N/A  . Years of Education: N/A   Social History Main Topics  . Smoking status: Former Smoker    Quit date: 08/29/2000  . Smokeless tobacco: Never Used  . Alcohol Use: No  . Drug Use: No  . Sexual Activity: Not Asked   Other Topics Concern  . None   Social History Narrative   Review of Systems  Constitutional: Negative.   Psychiatric/Behavioral:       Anxiety/mood improved.   Objective:  BP 136/76 mmHg  Pulse 73  Temp(Src) 98.3 F (36.8 C) (Oral)  Wt 156 lb (70.761 kg)  SpO2 96%  BP/Weight 02/08/2016 12/28/2015 [id number]  Systolic BP [id number]  Diastolic BP 76 76 84  Wt. (Lbs) 156 155.38 156  BMI 28.06 27.95 28.06   Physical Exam  Constitutional: She is oriented to person, place, and time. She appears well-developed. No distress.  Cardiovascular: Normal rate and regular rhythm.   Pulmonary/Chest: Effort normal. She has no wheezes. She has no rales.  Neurological: She is alert and oriented to person, place, and time.  Psychiatric: She has a normal mood and affect.  Vitals reviewed.  Lab Results  Component Value Date   WBC 11.6* 05/20/2014   HGB 11.8* 05/20/2014   HCT 36.1 05/20/2014   PLT 238 05/20/2014   GLUCOSE 90 05/19/2014   CHOL 161  05/20/2014   TRIG 171 05/20/2014   HDL 32* 05/20/2014   LDLCALC 95 05/20/2014   ALT 29 07/22/2012   AST 27 07/22/2012   NA 145 05/19/2014   K 3.8 05/19/2014   CL 114* 05/19/2014   CREATININE 0.79 05/19/2014   BUN 12 05/19/2014   CO2 27 05/19/2014   INR 1.1 05/19/2014   Assessment & Plan:   Problem List Items Addressed This Visit    Anxiety and depression - Primary    Established problem, improving. Decreased Cymbalta per patient request (to 20 mg). Rx sent.        Meds ordered this encounter  Medications  . omeprazole (PRILOSEC) 20 MG capsule    Sig: Take 1 capsule (20 mg total) by mouth daily.    Dispense:  90 capsule    Refill:  3  . promethazine (PHENERGAN) 25 MG tablet    Sig: Take 1 tablet (25 mg total) by mouth every 8 (eight) hours as needed for nausea or vomiting.    Dispense:  30 tablet    Refill:  2  . DULoxetine (CYMBALTA) 20 MG capsule    Sig: Take 1 capsule (20 mg total) by mouth daily.    Dispense:  30 capsule    Refill:  0   Follow-up: Return in about 6 months (around 08/10/2016).  [facility]  [employer]

## 2016-02-08 NOTE — Assessment & Plan Note (Signed)
Established problem, improving. Decreased Cymbalta per patient request (to 20 mg). Rx sent.

## 2016-02-08 NOTE — Patient Instructions (Signed)
Send me a my chart message regarding the Cymbalta.  Follow up in 6 months to 1 year.  Take care  Dr. Lacinda Axon

## 2016-02-14 ENCOUNTER — Encounter: Payer: Self-pay | Admitting: Family Medicine

## 2016-03-02 ENCOUNTER — Emergency Department: Payer: 59

## 2016-03-02 ENCOUNTER — Emergency Department
Admission: EM | Admit: 2016-03-02 | Discharge: 2016-03-02 | Disposition: A | Discharge status: Home / Self Care | Payer: 59 | Attending: Emergency Medicine | Admitting: Emergency Medicine

## 2016-03-02 ENCOUNTER — Encounter: Payer: Self-pay | Admitting: Emergency Medicine

## 2016-03-02 DIAGNOSIS — Z87891 Personal history of nicotine dependence: Secondary | ICD-10-CM | POA: Diagnosis not present

## 2016-03-02 DIAGNOSIS — Z79891 Long term (current) use of opiate analgesic: Secondary | ICD-10-CM | POA: Insufficient documentation

## 2016-03-02 DIAGNOSIS — Z79899 Other long term (current) drug therapy: Secondary | ICD-10-CM | POA: Insufficient documentation

## 2016-03-02 DIAGNOSIS — K5732 Diverticulitis of large intestine without perforation or abscess without bleeding: Secondary | ICD-10-CM | POA: Diagnosis not present

## 2016-03-02 DIAGNOSIS — I2510 Atherosclerotic heart disease of native coronary artery without angina pectoris: Secondary | ICD-10-CM | POA: Insufficient documentation

## 2016-03-02 DIAGNOSIS — K529 Noninfective gastroenteritis and colitis, unspecified: Secondary | ICD-10-CM

## 2016-03-02 DIAGNOSIS — K6389 Other specified diseases of intestine: Secondary | ICD-10-CM

## 2016-03-02 DIAGNOSIS — Z7982 Long term (current) use of aspirin: Secondary | ICD-10-CM | POA: Diagnosis not present

## 2016-03-02 DIAGNOSIS — R1030 Lower abdominal pain, unspecified: Secondary | ICD-10-CM | POA: Diagnosis present

## 2016-03-02 LAB — COMPREHENSIVE METABOLIC PANEL WITH GFR
ALT: 41 U/L (ref 14–54)
AST: 37 U/L (ref 15–41)
Albumin: 4.3 g/dL (ref 3.5–5.0)
Alkaline Phosphatase: 86 U/L (ref 38–126)
Anion gap: 7 (ref 5–15)
BUN: 17 mg/dL (ref 6–20)
CO2: 30 mmol/L (ref 22–32)
Calcium: 9.3 mg/dL (ref 8.9–10.3)
Chloride: 101 mmol/L (ref 101–111)
Creatinine, Ser: 0.88 mg/dL (ref 0.44–1.00)
GFR calc Af Amer: >60 mL/min
GFR calc non Af Amer: >60 mL/min
Glucose, Bld: 90 mg/dL (ref 65–99)
Potassium: 3.9 mmol/L (ref 3.5–5.1)
Sodium: 138 mmol/L (ref 135–145)
Total Bilirubin: 0.6 mg/dL (ref 0.3–1.2)
Total Protein: 7.8 g/dL (ref 6.5–8.1)

## 2016-03-02 LAB — URINALYSIS COMPLETE WITH MICROSCOPIC (ARMC ONLY)
Bacteria, UA: NONE SEEN
Bilirubin Urine: NEGATIVE
Glucose, UA: NEGATIVE mg/dL
Hgb urine dipstick: NEGATIVE
Ketones, ur: NEGATIVE mg/dL
Leukocytes, UA: NEGATIVE
Nitrite: NEGATIVE
Protein, ur: NEGATIVE mg/dL
Specific Gravity, Urine: 1.011 (ref 1.005–1.030)
WBC, UA: NONE SEEN WBC/hpf (ref 0–5)
pH: 6 (ref 5.0–8.0)

## 2016-03-02 LAB — CBC WITH DIFFERENTIAL/PLATELET
BASOS ABS: 0 10*3/uL (ref 0–0.1)
BASOS PCT: 0 %
EOS ABS: 0.2 10*3/uL (ref 0–0.7)
Eosinophils Relative: 1 %
HEMATOCRIT: 40.8 % (ref 35.0–47.0)
HEMOGLOBIN: 14 g/dL (ref 12.0–16.0)
Lymphocytes Relative: 26 %
Lymphs Abs: 2.9 10*3/uL (ref 1.0–3.6)
MCH: 31.8 pg (ref 26.0–34.0)
MCHC: 34.4 g/dL (ref 32.0–36.0)
MCV: 92.4 fL (ref 80.0–100.0)
MONOS PCT: 7 %
Monocytes Absolute: 0.8 10*3/uL (ref 0.2–0.9)
NEUTROS ABS: 7.6 10*3/uL — AB (ref 1.4–6.5)
NEUTROS PCT: 66 %
Platelets: 264 10*3/uL (ref 150–440)
RBC: 4.42 MIL/uL (ref 3.80–5.20)
RDW: 12.9 % (ref 11.5–14.5)
WBC: 11.5 10*3/uL — AB (ref 3.6–11.0)

## 2016-03-02 LAB — LACTIC ACID, PLASMA: Lactic Acid, Venous: 1.2 mmol/L (ref 0.5–1.9)

## 2016-03-02 LAB — LIPASE, BLOOD: LIPASE: 24 U/L (ref 11–51)

## 2016-03-02 MED ORDER — METRONIDAZOLE 500 MG PO TABS: 500.0000 mg | ORAL_TABLET | Freq: Two times a day (BID) | ORAL | 0 refills | Status: DC

## 2016-03-02 MED ORDER — ONDANSETRON HCL 4 MG/2ML IJ SOLN
4.0000 mg | Freq: Once | INTRAMUSCULAR | Status: AC
  Administered 2016-03-02: 4 mg via INTRAVENOUS
  Filled 2016-03-02: qty 2

## 2016-03-02 MED ORDER — IOPAMIDOL (ISOVUE-300) INJECTION 61%
100.0000 mL | Freq: Once | INTRAVENOUS | Status: AC | PRN
  Administered 2016-03-02: 100 mL via INTRAVENOUS

## 2016-03-02 MED ORDER — CIPROFLOXACIN HCL 500 MG PO TABS
500.0000 mg | ORAL_TABLET | Freq: Once | ORAL | Status: AC
  Administered 2016-03-02: 500 mg via ORAL
  Filled 2016-03-02: qty 1

## 2016-03-02 MED ORDER — SODIUM CHLORIDE 0.9 % IV BOLUS (SEPSIS)
1000.0000 mL | Freq: Once | INTRAVENOUS | Status: AC
  Administered 2016-03-02: 1000 mL via INTRAVENOUS

## 2016-03-02 MED ORDER — CIPROFLOXACIN HCL 500 MG PO TABS: 500.0000 mg | ORAL_TABLET | Freq: Two times a day (BID) | ORAL | 0 refills | Status: AC

## 2016-03-02 MED ORDER — MORPHINE SULFATE (PF) 2 MG/ML IV SOLN
2.0000 mg | Freq: Once | INTRAVENOUS | Status: AC
  Administered 2016-03-02: 2 mg via INTRAVENOUS
  Filled 2016-03-02: qty 1

## 2016-03-02 MED ORDER — DIATRIZOATE MEGLUMINE & SODIUM 66-10 % PO SOLN
15.0000 mL | ORAL | Status: AC
  Administered 2016-03-02: 15 mL via ORAL

## 2016-03-02 MED ORDER — MORPHINE SULFATE (PF) 4 MG/ML IV SOLN
INTRAVENOUS | Status: AC
  Administered 2016-03-02: 4 mg via INTRAVENOUS
  Filled 2016-03-02: qty 1

## 2016-03-02 MED ORDER — METRONIDAZOLE 500 MG PO TABS
500.0000 mg | ORAL_TABLET | Freq: Once | ORAL | Status: AC
  Administered 2016-03-02: 500 mg via ORAL
  Filled 2016-03-02: qty 1

## 2016-03-02 MED ORDER — MORPHINE SULFATE (PF) 4 MG/ML IV SOLN
4.0000 mg | Freq: Once | INTRAVENOUS | Status: AC
  Administered 2016-03-02: 4 mg via INTRAVENOUS

## 2016-03-02 NOTE — ED Triage Notes (Signed)
Pt sent from [facility] with c/o lower abd pain that radiates all the way around into the back since Tuesday.. Denies N/V/D.Marland Kitchen

## 2016-03-02 NOTE — ED Notes (Signed)
Pt to CT scan.

## 2016-03-02 NOTE — ED Provider Notes (Signed)
[facility] Department Provider Note ____________________________________________  Time seen: Approximately 9:50am I have reviewed the triage vital signs and the triage nursing note.  HISTORY  Chief Complaint Abdominal Pain   Historian Patient and husband  HPI Carolyn Esparza is a 50 y.o. female here for evaluation of lower abdominal pain which started on Tuesday and has progressed and been worse since then. When she lies still it is not that bad when she moves around it hurts to lie in the pelvic area and into her lower back and up to her umbilicus. No fever. No vomiting. No black or bloody stools. She states that she was previously told that she had Crohn's, but on her most recent colonoscopy in 2015 she states that there was no evidence of Crohn's. She denies bowel changes. She denies dysuria. She has had an appendectomy. She has had a hysterectomy, has 1 ovary still. She reports a history of prior endometriosis.  Pain when moving is moderate to severe, at rest mild. Movement makes it worse.    Past Medical History:  Diagnosis Date  . CAD (coronary artery disease)   . Chicken pox   . Depression   . GERD (gastroesophageal reflux disease)   . History of Crohn's disease   . Hyperlipidemia   . Thyroid nodule     Patient Active Problem List   Diagnosis Date Noted  . Anxiety and depression 12/28/2015  . History of obstructive sleep apnea 12/15/2015  . Thyroid nodule 12/15/2015  . GERD (gastroesophageal reflux disease) 12/15/2015  . Hyperlipidemia 12/15/2015  . Encounter for preventative adult health care exam with abnormal findings 12/15/2015  . CAD in native artery 02/05/2015  . Chronic pain 07/18/2013  . Crohn's disease ([place]) 01/23/2012    Past Surgical History:  Procedure Laterality Date  . ABDOMINAL HYSTERECTOMY    . APPENDECTOMY  2004  . BLADDER SURGERY    . BREAST BIOPSY     x 2  . WISDOM TOOTH EXTRACTION      Prior to  Admission medications   Medication Sig Start Date End Date Taking? Authorizing Provider  Ascorbic Acid (VITAMIN C) 1000 MG tablet Take 1,000 mg by mouth daily.    Yes Historical Provider, MD  aspirin EC 81 MG tablet Take 81 mg by mouth daily.   Yes Historical Provider, MD  atorvastatin (LIPITOR) 10 MG tablet Take 10 mg by mouth daily at 6 PM.  08/10/15  Yes Historical Provider, MD  buPROPion (WELLBUTRIN XL) 300 MG 24 hr tablet Take 300 mg by mouth daily.  10/29/15  Yes Historical Provider, MD  DULoxetine (CYMBALTA) 20 MG capsule Take 1 capsule (20 mg total) by mouth daily. 02/08/16  Yes Coral Spikes, DO  estradiol (CLIMARA - DOSED IN MG/24 HR) 0.05 mg/24hr patch Place 0.05 mg onto the skin once a week.  09/06/15  Yes Historical Provider, MD  fentaNYL (DURAGESIC - DOSED MCG/HR) 25 MCG/HR patch Place 25 mcg onto the skin every 3 (three) days.    Yes Historical Provider, MD  HYDROcodone-acetaminophen (NORCO) 10-325 MG tablet Take 1 tablet by mouth every 6 (six) hours as needed.  11/13/15  Yes Historical Provider, MD  lidocaine (LIDODERM) 5 % Place 2 patches onto the skin daily.    Yes Historical Provider, MD  Multiple Vitamin (MULTI-VITAMINS) TABS Take 1 tablet by mouth daily.    Yes Historical Provider, MD  Omega-3 Fatty Acids (FISH OIL PO) Take 1 capsule by mouth daily.    Yes Historical  Provider, MD  omeprazole (PRILOSEC) 20 MG capsule Take 1 capsule (20 mg total) by mouth daily. 02/08/16  Yes Coral Spikes, DO  promethazine (PHENERGAN) 25 MG tablet Take 1 tablet (25 mg total) by mouth every 8 (eight) hours as needed for nausea or vomiting. 02/08/16  Yes Coral Spikes, DO  ciprofloxacin (CIPRO) 500 MG tablet Take 1 tablet (500 mg total) by mouth 2 (two) times daily. 03/02/16 03/09/16  Lisa Roca, MD  metroNIDAZOLE (FLAGYL) 500 MG tablet Take 1 tablet (500 mg total) by mouth 2 (two) times daily. 03/02/16   Lisa Roca, MD    Allergies  Allergen Reactions  . Propofol Palpitations    BP drops and EKG changes,  bladder retention  . Mesalamine Other (See Comments)    Chest pain.  . Nsaids Other (See Comments)    Flare of Crohn's.    Family History  Problem Relation Age of Onset  . Alcohol abuse Mother   . Hyperlipidemia Mother   . Heart disease Mother   . Hypertension Mother   . Diabetes Mother     Social History Social History  Substance Use Topics  . Smoking status: Former Smoker    Quit date: 08/29/2000  . Smokeless tobacco: Never Used  . Alcohol use No    Review of Systems  Constitutional: Negative for fever. Eyes: Negative for visual changes. ENT: Negative for sore throat. Cardiovascular: Negative for chest pain. Respiratory: Negative for shortness of breath. Gastrointestinal: Negative for vomiting and diarrhea. Genitourinary: Negative for dysuria. Musculoskeletal: Mild back pain. Skin: Negative for rash. Neurological: Negative for headache. 10 point Review of Systems otherwise negative ____________________________________________   PHYSICAL EXAM:  VITAL SIGNS: ED Triage Vitals  Enc Vitals Group     BP 03/02/16 0909 (!) 127/92     Pulse Rate 03/02/16 0909 74     Resp 03/02/16 0909 15     Temp 03/02/16 0909 98.5 F (36.9 C)     Temp Source 03/02/16 0909 Oral     SpO2 03/02/16 0909 100 %     Weight 03/02/16 0909 155 lb (70.3 kg)     Height 03/02/16 0909 5\' 3"  (1.6 m)     Head Circumference --      Peak Flow --      Pain Score 03/02/16 0904 8     Pain Loc --      Pain Edu? --      Excl. in [place]? --      Constitutional: Alert and oriented. Well appearing and in no distress. HEENT   Head: Normocephalic and atraumatic.      Eyes: Conjunctivae are normal. PERRL. Normal extraocular movements.      Ears:         Nose: No congestion/rhinnorhea.   Mouth/Throat: Mucous membranes are moist.   Neck: No stridor. Cardiovascular/Chest: Normal rate, regular rhythm.  No murmurs, rubs, or gallops. Respiratory: Normal respiratory effort without tachypnea nor  retractions. Breath sounds are clear and equal bilaterally. No wheezes/rales/rhonchi. Gastrointestinal: Soft. No distention, no guarding, no rebound. Moderate tenderness in the suprapubic right lower quadrant and left lower quadrant Genitourinary/rectal:Deferred Musculoskeletal: Nontender with normal range of motion in all extremities. No joint effusions.  No lower extremity tenderness.  No edema. Neurologic:  Normal speech and language. No gross or focal neurologic deficits are appreciated. Skin:  Skin is warm, dry and intact. No rash noted. Psychiatric: Mood and affect are normal. Speech and behavior are normal. Patient exhibits appropriate insight and judgment.  ____________________________________________   EKG I, Lisa Roca, MD, the attending physician have personally viewed and interpreted all ECGs.  None ____________________________________________  LABS (pertinent positives/negatives)  Labs Reviewed  CBC WITH DIFFERENTIAL/PLATELET - Abnormal; Notable for the following:       Result Value   WBC 11.5 (*)    Neutro Abs 7.6 (*)    All other components within normal limits  URINALYSIS COMPLETEWITH MICROSCOPIC ([facility] ONLY) - Abnormal; Notable for the following:    Color, Urine YELLOW (*)    APPearance CLEAR (*)    Squamous Epithelial / LPF 0-5 (*)    All other components within normal limits  COMPREHENSIVE METABOLIC PANEL  LIPASE, BLOOD  LACTIC ACID, PLASMA  LACTIC ACID, PLASMA    ____________________________________________  RADIOLOGY All Xrays were viewed by me. Imaging interpreted by Radiologist.  CT abdomen and pelvis with contrast:  IMPRESSION: Inflammation at the level of the splenic flexure along the anterior margin of proximal descending colon suspicious for epiploic appendagitis. Although there are diverticula near the inflammation, diverticulitis is felt to be a secondary less likely consideration as is Crohn's disease.  Posterior inferior aspect right  lobe of liver lesion has increased over time presently measuring 3.3 x 2.2 x 2.9 cm. On 2015 exam, this measured 2.8 x 2.4 x 2.6 cm. There is peripheral puddling type enhancement which is most consistent with a hemangioma.  Degenerative changes L4-5 and L5-S1.  Per history patient is post hysterectomy. Within the vaginal cuff, there is a 2 cm cystic structure of indeterminate etiology.   Electronically Signed   By: Genia Del M.D.   On: 03/02/2016 13:25 __________________________________________  PROCEDURES  Procedure(s) performed: None  Critical Care performed: None  ____________________________________________   ED COURSE / ASSESSMENT AND PLAN  Pertinent labs & imaging results that were available during my care of the patient were reviewed by me and considered in my medical decision making (see chart for details).   This patient is here for almost 2 days of abdominal pain, by history sounds concerning for possible diverticulitis, endometriosis, Crohn's flare. No acute abdomen on exam. Vitals are stable.  I spoke with her about risks versus benefit of CT imaging for diagnostic purposes and we chose to proceed.   CT scan shows epiploic appendagitis, versus possible diverticulitis or Crohn's.  I spoke with this patient, and we will go ahead and cover her for possible diverticulitis with Cipro and Flagyl. She is under pain management contract and will not discharge with any additional pain medications.    CONSULTATIONS:   None   Patient / Family / Caregiver informed of clinical course, medical decision-making process, and agree with plan.   I discussed return precautions, follow-up instructions, and discharged instructions with patient and/or family.   ___________________________________________   FINAL CLINICAL IMPRESSION(S) / ED DIAGNOSES   Final diagnoses:  Epiploic appendagitis  Diverticulitis of large intestine without perforation or abscess without  bleeding              Note: This dictation was prepared with Dragon dictation. Any transcriptional errors that result from this process are unintentional    Lisa Roca, MD 03/02/16 1506

## 2016-03-02 NOTE — Discharge Instructions (Signed)
Return to the emergency department for any worsening abdominal pain, fever, black or bloody stool, dizziness or passing out, or any other symptoms concerning to you.

## 2016-03-05 ENCOUNTER — Other Ambulatory Visit: Payer: Self-pay | Admitting: Family Medicine

## 2016-03-06 ENCOUNTER — Other Ambulatory Visit: Payer: Self-pay | Admitting: Family Medicine

## 2016-03-06 MED ORDER — DULOXETINE HCL 20 MG PO CPEP: 20.0000 mg | ORAL_CAPSULE | Freq: Every day | ORAL | 2 refills | Status: DC

## 2016-03-06 NOTE — Telephone Encounter (Signed)
PCP gave okay for refill.

## 2016-03-10 ENCOUNTER — Encounter: Payer: Self-pay | Admitting: Family Medicine

## 2016-03-10 ENCOUNTER — Ambulatory Visit (INDEPENDENT_AMBULATORY_CARE_PROVIDER_SITE_OTHER): Payer: 59 | Admitting: Family Medicine

## 2016-03-10 DIAGNOSIS — R1030 Lower abdominal pain, unspecified: Secondary | ICD-10-CM | POA: Diagnosis not present

## 2016-03-10 MED ORDER — PREDNISONE 10 MG PO TABS: ORAL_TABLET | ORAL | 0 refills | Status: DC

## 2016-03-10 NOTE — Progress Notes (Signed)
Pre visit review using our clinic review tool, if applicable. No additional management support is needed unless otherwise documented below in the visit note. 

## 2016-03-10 NOTE — Patient Instructions (Signed)
Take the medication as prescribed.  I will call next week to check in.  Take care  Dr. Lacinda Axon

## 2016-03-12 DIAGNOSIS — R109 Unspecified abdominal pain: Secondary | ICD-10-CM

## 2016-03-12 HISTORY — DX: Unspecified abdominal pain: R10.9

## 2016-03-12 NOTE — Assessment & Plan Note (Signed)
New acute problem. Given lack of resolution, I'm concerned about Crohn's flare. Treating with prednisone.

## 2016-03-12 NOTE — Progress Notes (Signed)
Subjective:  Patient ID: Carolyn Esparza, female    DOB: 22-May-1966  Age: 50 y.o. MRN: [id number]  CC: Abdominal pain  HPI:  50 year old female with anxiety/depression, CAD, chronic pain, and a history of Crohn's disease presents with complaints of abdominal pain.  Patient was recently seen in the ED on 8/3. She presented with lower abdominal pain. Laboratory studies revealed a mildly elevated white count. CT abdomen and pelvis was obtained and revealed inflammation at the splenic flexure and anterior margin of the proximal descending colon suspicious for epiploic appendagitis. Thought less likely to be diverticulitis or Crohn's. Patient was treated with Cipro and Flagyl and discharged home in stable condition.  Patient resents today with continued complaints of lower abdominal pain. She reports that she is loaded as well. She states that she had some improvement with antibiotics but no resolution. She is concerned about her symptoms. No reports of nausea or vomiting. No hematochezia or melena. No known exacerbating factors. No other complaint at this time.  Social Hx   Social History   Social History  . Marital status: Married    Spouse name: N/A  . Number of children: N/A  . Years of education: N/A   Social History Main Topics  . Smoking status: Former Smoker    Quit date: 08/29/2000  . Smokeless tobacco: Never Used  . Alcohol use No  . Drug use: No  . Sexual activity: Not Asked   Other Topics Concern  . None   Social History Narrative  . None   Review of Systems  Constitutional: Negative.   Gastrointestinal: Positive for abdominal pain.   Objective:  BP 134/86 (BP Location: Right Arm, Patient Position: Sitting, Cuff Size: Normal)   Pulse 71   Temp 97.9 F (36.6 C) (Oral)   Wt 162 lb 6 oz (73.7 kg)   SpO2 96%   BMI 28.76 kg/m   BP/Weight 03/10/2016 03/02/2016 Q000111Q  Systolic BP Q000111Q [id number]  Diastolic BP 86 78 76  Wt. (Lbs) 162.38 155 156  BMI 28.76 27.46  28.06    Physical Exam  Constitutional: She is oriented to person, place, and time. She appears well-developed. No distress.  Cardiovascular: Normal rate and regular rhythm.   Pulmonary/Chest: Effort normal. She has no wheezes. She has no rales.  Abdominal: Soft. She exhibits no distension. There is no rebound and no guarding.  Tender to palpation in the left lower quadrant and left periumbilical region.  Neurological: She is alert and oriented to person, place, and time.  Psychiatric: She has a normal mood and affect.  Vitals reviewed.  Lab Results  Component Value Date   WBC 11.5 (H) 03/02/2016   HGB 14.0 03/02/2016   HCT 40.8 03/02/2016   PLT 264 03/02/2016   GLUCOSE 90 03/02/2016   CHOL 161 05/20/2014   TRIG 171 05/20/2014   HDL 32 (L) 05/20/2014   LDLCALC 95 05/20/2014   ALT 41 03/02/2016   AST 37 03/02/2016   NA 138 03/02/2016   K 3.9 03/02/2016   CL 101 03/02/2016   CREATININE 0.88 03/02/2016   BUN 17 03/02/2016   CO2 30 03/02/2016   INR 1.1 05/19/2014   Assessment & Plan:   Problem List Items Addressed This Visit    Lower abdominal pain    New acute problem. Given lack of resolution, I'm concerned about Crohn's flare. Treating with prednisone.       Other Visit Diagnoses   None.     Meds ordered this  encounter  Medications  . predniSONE (DELTASONE) 10 MG tablet    Sig: 50 mg (5 tablets) daily x 3 days, then 40 mg (4 tablets) daily x 3 days, then 30 mg (3 tablets) daily x 3 days, then 20 mg (2 tablets) daily x 3 days, then 10 mg (1 tablet) daily x 3 days.    Dispense:  45 tablet    Refill:  0    Follow-up: PRN  [place]

## 2016-03-31 ENCOUNTER — Other Ambulatory Visit: Payer: Self-pay | Admitting: Family Medicine

## 2016-03-31 ENCOUNTER — Encounter: Payer: Self-pay | Admitting: Family Medicine

## 2016-03-31 DIAGNOSIS — K50919 Crohn's disease, unspecified, with unspecified complications: Secondary | ICD-10-CM

## 2016-04-05 ENCOUNTER — Encounter: Payer: Self-pay | Admitting: Gastroenterology

## 2016-05-15 ENCOUNTER — Other Ambulatory Visit: Payer: Self-pay | Admitting: Family Medicine

## 2016-05-15 MED ORDER — ATORVASTATIN CALCIUM 10 MG PO TABS: 10.0000 mg | ORAL_TABLET | Freq: Every day | ORAL | 0 refills | Status: DC

## 2016-05-15 NOTE — Telephone Encounter (Signed)
Historical medication. Pt last seen 12/13/15. Pt had labs on 05/20/14. Please advise?

## 2016-05-26 ENCOUNTER — Other Ambulatory Visit: Payer: Self-pay | Admitting: Obstetrics & Gynecology

## 2016-05-26 DIAGNOSIS — Z1231 Encounter for screening mammogram for malignant neoplasm of breast: Secondary | ICD-10-CM

## 2016-05-30 ENCOUNTER — Ambulatory Visit (INDEPENDENT_AMBULATORY_CARE_PROVIDER_SITE_OTHER): Payer: 59 | Admitting: Family Medicine

## 2016-05-30 ENCOUNTER — Encounter: Payer: Self-pay | Admitting: Family Medicine

## 2016-05-30 VITALS — BP 124/80 | HR 74 | Temp 98.1°F | Resp 14 | Wt 172.2 lb

## 2016-05-30 DIAGNOSIS — F418 Other specified anxiety disorders: Secondary | ICD-10-CM

## 2016-05-30 DIAGNOSIS — M545 Low back pain: Secondary | ICD-10-CM

## 2016-05-30 DIAGNOSIS — R1030 Lower abdominal pain, unspecified: Secondary | ICD-10-CM

## 2016-05-30 DIAGNOSIS — G8929 Other chronic pain: Secondary | ICD-10-CM

## 2016-05-30 DIAGNOSIS — F329 Major depressive disorder, single episode, unspecified: Secondary | ICD-10-CM

## 2016-05-30 DIAGNOSIS — F419 Anxiety disorder, unspecified: Secondary | ICD-10-CM

## 2016-05-30 DIAGNOSIS — M549 Dorsalgia, unspecified: Secondary | ICD-10-CM

## 2016-05-30 DIAGNOSIS — F32A Depression, unspecified: Secondary | ICD-10-CM

## 2016-05-30 HISTORY — DX: Other chronic pain: G89.29

## 2016-05-30 NOTE — Assessment & Plan Note (Signed)
Established problem, persistent. Unclear etiology. ? From Crohn's. Patient inquired about MRI the abdomen. I informed her that this is not likely to be helpful. She should follow-up with GI. She has an upcoming appointment.

## 2016-05-30 NOTE — Assessment & Plan Note (Signed)
Stable.  Patient should be switching to Cymbalta. Advised to taper off (20 mg every other day x 1 week then discontinue).  Continue Wellbutrin.

## 2016-05-30 NOTE — Progress Notes (Signed)
Subjective:  Patient ID: Carolyn Esparza, female    DOB: 12-24-1965  Age: 50 y.o. MRN: [id number]  CC: Abdominal pain, back pain, discuss meds for anxiety/depression  HPI:  50 year old female With a past medical history of Crohn's disease, depression/anxiety presents with the above complaints.  Lower abdominal pain  Ongoing issue.  Had improvement with steroid taper but pain persists.  Located in the lower abdomen bilaterally.  No bowel changes.   No fever, chills.  No hematochezia or melena.   Would like to discuss imaging today.  Back pain  Long standing problem.  Followed by pain management.   Has not had imaging of spine in years and would like to discuss repeat imaging today.   Anxiety/depression  Has had weight gain and attributes this to cymbalta.  Would like to discuss discontinuation/changing therapy today.  Anxiety and depression are well controlled at this time.  Social Hx   Social History   Social History  . Marital status: Married    Spouse name: N/A  . Number of children: N/A  . Years of education: N/A   Social History Main Topics  . Smoking status: Former Smoker    Quit date: 08/29/2000  . Smokeless tobacco: Never Used  . Alcohol use No  . Drug use: No  . Sexual activity: Not Asked   Other Topics Concern  . None   Social History Narrative  . None    Review of Systems  Gastrointestinal: Positive for abdominal pain.  Musculoskeletal: Positive for back pain.    Objective:  BP 124/80 (BP Location: Right Arm, Patient Position: Sitting, Cuff Size: Normal)   Pulse 74   Temp 98.1 F (36.7 C) (Oral)   Resp 14   Wt 172 lb 4 oz (78.1 kg)   SpO2 96%   BMI 30.51 kg/m   BP/Weight 05/30/2016 A999333 A999333  Systolic BP A999333 Q000111Q [id number]  Diastolic BP 80 86 78  Wt. (Lbs) 172.25 162.38 155  BMI 30.51 28.76 27.46    Physical Exam  Constitutional: She is oriented to person, place, and time. She appears well-developed. No distress.    Cardiovascular: Normal rate and regular rhythm.   Pulmonary/Chest: Effort normal and breath sounds normal.  Abdominal: Soft. She exhibits no distension.  Tender to palpation in the RLQ and LLQ.   Neurological: She is alert and oriented to person, place, and time.  Psychiatric: She has a normal mood and affect.  Vitals reviewed.   Lab Results  Component Value Date   WBC 11.5 (H) 03/02/2016   HGB 14.0 03/02/2016   HCT 40.8 03/02/2016   PLT 264 03/02/2016   GLUCOSE 90 03/02/2016   CHOL 161 05/20/2014   TRIG 171 05/20/2014   HDL 32 (L) 05/20/2014   LDLCALC 95 05/20/2014   ALT 41 03/02/2016   AST 37 03/02/2016   NA 138 03/02/2016   K 3.9 03/02/2016   CL 101 03/02/2016   CREATININE 0.88 03/02/2016   BUN 17 03/02/2016   CO2 30 03/02/2016   INR 1.1 05/19/2014    Assessment & Plan:   Problem List Items Addressed This Visit    Lower abdominal pain - Primary    Established problem, persistent. Unclear etiology. ? From Crohn's. Patient inquired about MRI the abdomen. I informed her that this is not likely to be helpful. She should follow-up with GI. She has an upcoming appointment.      Chronic back pain    Chronic, severe. Stable at this  time. On chronic narcotics and follow-up pain management. Patient requesting MRI for re-evaluation. Will arrange.       Relevant Orders   MR Lumbar Spine Wo Contrast   Anxiety and depression    Stable.  Patient should be switching to Cymbalta. Advised to taper off (20 mg every other day x 1 week then discontinue).  Continue Wellbutrin.       Other Visit Diagnoses   None.    Follow-up: Return in about 6 months (around 11/27/2016).  [place]

## 2016-05-30 NOTE — Assessment & Plan Note (Signed)
Chronic, severe. Stable at this time. On chronic narcotics and follow-up pain management. Patient requesting MRI for re-evaluation. Will arrange.

## 2016-05-30 NOTE — Patient Instructions (Signed)
Taper off the cymbalta as we discussed.   Be sure to see GI.  We will call regarding the MRI.  Take care  Dr. Lacinda Axon

## 2016-06-06 ENCOUNTER — Other Ambulatory Visit: Payer: Self-pay | Admitting: Family Medicine

## 2016-06-07 LAB — LIPID PANEL W/O CHOL/HDL RATIO
Cholesterol, Total: 187 mg/dL (ref 100–199)
HDL: 46 mg/dL (ref >39)
LDL CALC: 116 mg/dL — AB (ref 0–99)
TRIGLYCERIDES: 127 mg/dL (ref 0–149)
VLDL CHOLESTEROL CAL: 25 mg/dL (ref 5–40)

## 2016-06-11 ENCOUNTER — Encounter: Payer: Self-pay | Admitting: Family Medicine

## 2016-06-14 ENCOUNTER — Ambulatory Visit
Admission: RE | Admit: 2016-06-14 | Discharge: 2016-06-14 | Disposition: A | Discharge status: Home / Self Care | Payer: 59 | Source: Ambulatory Visit | Attending: Family Medicine | Admitting: Family Medicine

## 2016-06-14 DIAGNOSIS — M47817 Spondylosis without myelopathy or radiculopathy, lumbosacral region: Secondary | ICD-10-CM | POA: Diagnosis not present

## 2016-06-14 DIAGNOSIS — M545 Low back pain: Secondary | ICD-10-CM | POA: Diagnosis present

## 2016-06-14 DIAGNOSIS — M1288 Other specific arthropathies, not elsewhere classified, other specified site: Secondary | ICD-10-CM | POA: Diagnosis not present

## 2016-06-14 DIAGNOSIS — M5127 Other intervertebral disc displacement, lumbosacral region: Secondary | ICD-10-CM | POA: Diagnosis not present

## 2016-06-14 DIAGNOSIS — M2578 Osteophyte, vertebrae: Secondary | ICD-10-CM | POA: Diagnosis not present

## 2016-06-14 DIAGNOSIS — G8929 Other chronic pain: Secondary | ICD-10-CM | POA: Diagnosis present

## 2016-06-14 DIAGNOSIS — M5126 Other intervertebral disc displacement, lumbar region: Secondary | ICD-10-CM | POA: Insufficient documentation

## 2016-06-15 ENCOUNTER — Encounter: Payer: Self-pay | Admitting: Family Medicine

## 2016-06-20 ENCOUNTER — Encounter: Payer: Self-pay | Admitting: Gastroenterology

## 2016-06-20 ENCOUNTER — Other Ambulatory Visit: Payer: Self-pay | Admitting: Gastroenterology

## 2016-06-20 ENCOUNTER — Ambulatory Visit
Admission: RE | Admit: 2016-06-20 | Discharge: 2016-06-20 | Disposition: A | Discharge status: Home / Self Care | Payer: 59 | Source: Ambulatory Visit | Attending: Obstetrics & Gynecology | Admitting: Obstetrics & Gynecology

## 2016-06-20 ENCOUNTER — Ambulatory Visit (INDEPENDENT_AMBULATORY_CARE_PROVIDER_SITE_OTHER): Payer: 59 | Admitting: Gastroenterology

## 2016-06-20 VITALS — BP 130/88 | HR 76 | Ht 62.5 in | Wt 174.2 lb

## 2016-06-20 DIAGNOSIS — M545 Low back pain: Secondary | ICD-10-CM

## 2016-06-20 DIAGNOSIS — R109 Unspecified abdominal pain: Secondary | ICD-10-CM | POA: Diagnosis not present

## 2016-06-20 DIAGNOSIS — Z8719 Personal history of other diseases of the digestive system: Secondary | ICD-10-CM

## 2016-06-20 DIAGNOSIS — K50918 Crohn's disease, unspecified, with other complication: Secondary | ICD-10-CM

## 2016-06-20 DIAGNOSIS — Z1231 Encounter for screening mammogram for malignant neoplasm of breast: Secondary | ICD-10-CM

## 2016-06-20 NOTE — Progress Notes (Signed)
Carolyn Esparza    [id number]    10-19-1965  Primary Care Physician:Jayce Bing Neighbors, DO  Referring Physician: Coral Spikes, DO [address] [place], [place] [address]  Chief complaint:  Lower abdpmen pain HPI: 50 year-old female here to establish care with complaints of chronic lower abdominal pain radiating from her back. Past records of GI workup unavailable during this office visit, per patient she was diagnosed with Crohn's disease in 2004 when she underwent colonoscopy for evaluation of abdominal pain, is noted to have ulcerations in the colon and at the time she was taking Advil excessively for pain control. Subsequent colonoscopy  a year later showed improvement in healing of the colonic ulcers. Most recent colonoscopy with biopsies in 05/19/2014 did not show any evidence of Crohn's disease. She has stopped taking NSAID's, and avoids processed meat. She is on chronic narcotics with history of constipation, currently is taking Colace along with senna once or twice a week, she uses additional MiraLAX or Dulcolax as needed.  Denies any diarrhea, nausea, vomiting, dysphagia, odynophagia, melena or blood per rectum She had an episode of acute right-sided abdominal pain in August 2017, presented to the ER, CT abdomen and pelvis showed findings suggestive of possible epiploic appendicitis or diverticulitis. She was treated with Cipro and Flagyl for a week with some improvement, subsequently was given a short course of steroids which provided significant relief of her pain transiently and after she completed the steroid course pain has recurred since    Outpatient Encounter Prescriptions as of 06/20/2016  Medication Sig  . Ascorbic Acid (VITAMIN C) 1000 MG tablet Take 1,000 mg by mouth daily.   Marland Kitchen aspirin EC 81 MG tablet Take 81 mg by mouth daily.  Marland Kitchen atorvastatin (LIPITOR) 10 MG tablet Take 1 tablet (10 mg total) by mouth daily at 6 PM.  . buPROPion (WELLBUTRIN XL) 300 MG  24 hr tablet Take 300 mg by mouth daily.   Marland Kitchen estradiol (CLIMARA - DOSED IN MG/24 HR) 0.05 mg/24hr patch Place 0.05 mg onto the skin once a week.   . fentaNYL (DURAGESIC - DOSED MCG/HR) 25 MCG/HR patch Place 25 mcg onto the skin every 3 (three) days.   Marland Kitchen HYDROcodone-acetaminophen (NORCO) 10-325 MG tablet Take 1 tablet by mouth every 6 (six) hours as needed.   . lidocaine (LIDODERM) 5 % Place 2 patches onto the skin daily.   . Multiple Vitamin (MULTI-VITAMINS) TABS Take 1 tablet by mouth daily.   . Omega-3 Fatty Acids (FISH OIL PO) Take 1 capsule by mouth daily.   Marland Kitchen omeprazole (PRILOSEC) 20 MG capsule Take 1 capsule (20 mg total) by mouth daily.  . promethazine (PHENERGAN) 25 MG tablet Take 1 tablet (25 mg total) by mouth every 8 (eight) hours as needed for nausea or vomiting.  . [DISCONTINUED] DULoxetine (CYMBALTA) 20 MG capsule Take 1 capsule (20 mg total) by mouth daily.   No facility-administered encounter medications on file as of 06/20/2016.     Allergies as of 06/20/2016 - Review Complete 06/20/2016  Allergen Reaction Noted  . Propofol Palpitations 12/13/2015  . Nsaids Other (See Comments) 12/13/2015  . Pentasa [mesalamine] Other (See Comments) 12/13/2015    Past Medical History:  Diagnosis Date  . Anxiety   . CAD (coronary artery disease)   . Chicken pox   . Depression   . GERD (gastroesophageal reflux disease)   . History of Crohn's disease   . Hyperlipidemia   .  Thyroid nodule     Past Surgical History:  Procedure Laterality Date  . ABDOMINAL HYSTERECTOMY    . APPENDECTOMY  2004  . BLADDER SURGERY    . BREAST BIOPSY     x 2  . WISDOM TOOTH EXTRACTION      Family History  Problem Relation Age of Onset  . Alcohol abuse Mother   . Hyperlipidemia Mother   . Heart disease Mother   . Hypertension Mother   . Diabetes Mother   . Crohn's disease Sister     1/2 sister  . Leukemia Paternal Grandmother   . Cancer Paternal Aunt     blood, type unknown    Social  History   Social History  . Marital status: Married    Spouse name: N/A  . Number of children: 0  . 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 Smoking status: Former Smoker    Quit date: 08/29/2000  . Smokeless tobacco: Never Used  . Alcohol use No  . Drug use: No  . Sexual activity: Not on file   Other Topics Concern  . Not on file   Social History Narrative  . No narrative on file      Review of systems: Review of Systems  Constitutional: Negative for fever and chills.  HENT: Negative.   Eyes: Negative for blurred vision.  Respiratory: Negative for cough, shortness of breath and wheezing.   Cardiovascular: Negative for chest pain and palpitations.  Gastrointestinal: as per HPI Genitourinary: Negative for dysuria, urgency, frequency and hematuria.  Musculoskeletal: Positive for myalgias, back pain and joint pain.  Skin: Negative for itching and rash.  Neurological: Negative for dizziness, tremors, focal weakness, seizures and loss of consciousness.  Endo/Heme/Allergies: Positive for seasonal allergies.  Psychiatric/Behavioral: Negative for depression, suicidal ideas and hallucinations.  All other systems reviewed and are negative.   Physical Exam: Vitals:   06/20/16 0820  BP: 130/88  Pulse: 76   Body mass index is 31.36 kg/m. Gen:      No acute distress HEENT:  EOMI, sclera anicteric Neck:     No masses; no thyromegaly Lungs:    Clear to auscultation bilaterally; normal respiratory effort CV:         Regular rate and rhythm; no murmurs Abd:      + bowel sounds; soft, non-tender; no palpable masses, no distension Ext:    No edema; adequate peripheral perfusion Skin:      Warm and dry; no rash Neuro: alert and oriented x 3 Psych: normal mood and affect  Data Reviewed:  Reviewed labs, radiology imaging, old records and pertinent past GI work up  CT abdomen and pelvis with contrast 03/02/16 Inflammation  at the level of the splenic flexure along the anterior margin of proximal descending colon suspicious for epiploic appendagitis. Although there are diverticula near the inflammation, diverticulitis is felt to be a secondary less likely consideration as is Crohn's disease.  Posterior inferior aspect right lobe of liver lesion has increased over time presently measuring 3.3 x 2.2 x 2.9 cm. On 2015 exam, this measured 2.8 x 2.4 x 2.6 cm. There is peripheral puddling type enhancement which is most consistent with a hemangioma.  Degenerative changes L4-5 and L5-S1.   MRI lumbar spine without contrast 06/14/16 Newly seen degenerative change at L2-3 with bulging of the disc and mild facet arthropathy. No compressive stenosis.  Newly seen left posterior lateral disc  herniation at L4-5 with a 1 cm fragment in the left lateral recess, likely to compress left L5 nerve root.  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 Narrowing of the lateral recesses left more than right. Some potential to affect the S1 nerve roots, particularly the left. Mild foraminal narrowing bilaterally.  Assessment and Plan/Recommendations: 50 year old female with history of chronic lower abdominal pain here for evaluation Based on MRI lumbar spine patient has significant L4-L5 and L5-S1 disease with nerve root compression and endplate osteophytes Based on what patient has said it seems that most likely she does not have IBD and most recent CT abdomen pelvis in August 2017 did not show any evidence of colitis, she does have question of epiploic appendicitis versus diverticulitis Obtain IBD serology as there is a question of Crohn's based on her prior colonoscopy, if negative very unlikely that patient has Crohn's disease Lower abdominal pain likely referred pain or secondary to nerve root compression Patient also gave a history of  urinary retention especially post procedures and urinary urgency /frequent urination. She has tingling in lower extremities occasionally We will refer to Surgery spine clinic for evaluation Patient may benefit from repeat colonoscopy given recent history of questionable diverticulitis We'll obtain records of prior colonoscopy and workup  45 minutes was spent face-to-face with the patient. Greater than 50% of the time used for counseling as well as treatment plan and follow-up. She had multiple questions which were answered to her satisfaction  K. Denzil Magnuson , MD [phone] Mon-Fri 8a-5p [phone] after 5p, weekends, holidays  CC: Coral Spikes, DO

## 2016-06-20 NOTE — Patient Instructions (Addendum)
We have given you the orders for your labs to take to [facility] please fax results att to Shirlean Mylar at [phone]  We will refer you to [facility] and contact you with that appointment   Follow up with Dr Silverio Decamp on 08/16/2016 Wed at 9:45am  It has been recommended to you by your physician that you have a(n) Colonoscopy completed. Per your request, we did not schedule the procedure(s) today. Please contact our office at [phone] should you decide to have the procedure completed.

## 2016-06-22 LAB — IBD EXPANDED PANEL
ACCA: 36 units (ref 0–90)
ALCA: 27 units (ref 0–60)
AMCA: 59 U (ref 0–100)
Atypical pANCA: NEGATIVE
GASCA: 37 U (ref 0–50)

## 2016-06-27 ENCOUNTER — Telehealth: Payer: Self-pay | Admitting: *Deleted

## 2016-06-27 ENCOUNTER — Other Ambulatory Visit: Payer: Self-pay | Admitting: Gastroenterology

## 2016-06-27 NOTE — Telephone Encounter (Signed)
[facility] are faxing over referral form today

## 2016-06-29 LAB — H. PYLORI ANTIGEN, STOOL: H PYLORI AG STL: NEGATIVE

## 2016-07-03 ENCOUNTER — Encounter: Payer: Self-pay | Admitting: Gastroenterology

## 2016-07-10 ENCOUNTER — Other Ambulatory Visit: Payer: Self-pay | Admitting: Family Medicine

## 2016-07-11 NOTE — Telephone Encounter (Signed)
refilled 02/08/16. Pt last seen 05/30/16. Please advise?

## 2016-07-18 LAB — HM PAP SMEAR: HM Pap smear: NEGATIVE

## 2016-07-26 LAB — HM PAP SMEAR: HM Pap smear: POSITIVE

## 2016-08-09 ENCOUNTER — Other Ambulatory Visit: Payer: Self-pay | Admitting: Family Medicine

## 2016-08-09 NOTE — Telephone Encounter (Signed)
Pt last refilled medication on 04/2016. Pts last labs 11/20017 she was told to increase medication to 40 mg. I called pt to make sure she is taking that amount or if she is taking it in all. A voicemail was left for her to callback.

## 2016-08-10 ENCOUNTER — Other Ambulatory Visit: Payer: Self-pay | Admitting: Family Medicine

## 2016-08-10 ENCOUNTER — Telehealth: Payer: Self-pay | Admitting: *Deleted

## 2016-08-10 ENCOUNTER — Ambulatory Visit: Payer: 59 | Admitting: Family Medicine

## 2016-08-10 MED ORDER — BUPROPION HCL ER (XL) 300 MG PO TB24: 300.0000 mg | ORAL_TABLET | Freq: Every day | ORAL | 1 refills | Status: DC

## 2016-08-10 MED ORDER — ROSUVASTATIN CALCIUM 10 MG PO TABS: 10.0000 mg | ORAL_TABLET | Freq: Every day | ORAL | 3 refills | Status: DC

## 2016-08-10 MED ORDER — ROSUVASTATIN CALCIUM 10 MG PO TABS: 10.0000 mg | ORAL_TABLET | Freq: Every day | ORAL | 0 refills | Status: DC

## 2016-08-10 NOTE — Telephone Encounter (Signed)
Pt called and information is on refill request.

## 2016-08-10 NOTE — Telephone Encounter (Signed)
Pt requested a returned call in reference to her med refill Pt contact  [phone]

## 2016-08-10 NOTE — Telephone Encounter (Signed)
Pt called back in regard to atorvastatin. She has read reviews on it in regards to diabetes. Pt has only been taking it every other day or less han that. Pt was asked if she'd like to switch she was okay with that. She would like it sent to [facility] for 30-days to see if she can tolerate.

## 2016-08-10 NOTE — Telephone Encounter (Signed)
rx sent to [facility] was canceled.

## 2016-08-10 NOTE — Telephone Encounter (Signed)
Rx sent. Please cancel Rx sent to [facility] by accident.

## 2016-08-16 ENCOUNTER — Ambulatory Visit: Payer: 59 | Admitting: Gastroenterology

## 2016-08-21 ENCOUNTER — Other Ambulatory Visit: Payer: Self-pay | Admitting: Family Medicine

## 2016-08-21 MED ORDER — PROMETHAZINE HCL 25 MG PO TABS: 25.0000 mg | ORAL_TABLET | Freq: Three times a day (TID) | ORAL | 2 refills | Status: DC | PRN

## 2016-08-21 NOTE — Telephone Encounter (Signed)
refilled 06/2016. Pt last seen 05/30/16. Please advise?

## 2016-08-23 ENCOUNTER — Encounter: Payer: Self-pay | Admitting: Family Medicine

## 2016-08-24 NOTE — Telephone Encounter (Signed)
Patient was seen on 07-11-16, they are faxing over the office note to [place] to be scanned in

## 2016-10-23 ENCOUNTER — Encounter: Payer: Self-pay | Admitting: Family Medicine

## 2016-10-24 ENCOUNTER — Other Ambulatory Visit: Payer: Self-pay | Admitting: Family Medicine

## 2016-10-24 ENCOUNTER — Other Ambulatory Visit: Payer: Self-pay

## 2016-10-24 MED ORDER — ROSUVASTATIN CALCIUM 10 MG PO TABS: 10.0000 mg | ORAL_TABLET | Freq: Every day | ORAL | 0 refills | Status: DC

## 2016-10-24 MED ORDER — ATORVASTATIN CALCIUM 10 MG PO TABS: 10.0000 mg | ORAL_TABLET | Freq: Every day | ORAL | 3 refills | Status: DC

## 2016-10-24 MED ORDER — ATORVASTATIN CALCIUM 10 MG PO TABS: 10.0000 mg | ORAL_TABLET | Freq: Every day | ORAL | 0 refills | Status: DC

## 2016-10-24 NOTE — Telephone Encounter (Signed)
Last lab indicated lipitor increased to 40 mg on 06/12/16 not on med list  Ok to send in script for Lipitor 40 mg please advise? Cancelled appt 08/10/16

## 2016-11-14 ENCOUNTER — Encounter: Payer: Self-pay | Admitting: Family Medicine

## 2016-11-14 ENCOUNTER — Other Ambulatory Visit: Payer: Self-pay | Admitting: Family Medicine

## 2016-11-14 MED ORDER — ATORVASTATIN CALCIUM 10 MG PO TABS: 10.0000 mg | ORAL_TABLET | Freq: Every day | ORAL | 3 refills | Status: DC

## 2016-12-08 ENCOUNTER — Ambulatory Visit: Payer: 59 | Admitting: Family Medicine

## 2016-12-11 ENCOUNTER — Encounter: Payer: Self-pay | Admitting: Family Medicine

## 2016-12-11 ENCOUNTER — Encounter: Payer: Self-pay | Admitting: Gastroenterology

## 2016-12-11 ENCOUNTER — Ambulatory Visit (INDEPENDENT_AMBULATORY_CARE_PROVIDER_SITE_OTHER): Payer: 59 | Admitting: Family Medicine

## 2016-12-11 DIAGNOSIS — R1030 Lower abdominal pain, unspecified: Secondary | ICD-10-CM

## 2016-12-11 MED ORDER — PREDNISONE 10 MG PO TABS: ORAL_TABLET | ORAL | 0 refills | Status: DC

## 2016-12-11 NOTE — Progress Notes (Signed)
Subjective:  Patient ID: Carolyn Esparza, female    DOB: 1965-11-18  Age: 51 y.o. MRN: [id number]  CC: Abdominal pain  HPI:  52 year old female with a remote history of Crohn's disease and chronic back pain presents with lower abdominal pain.  Lower abdominal pain  Patient has had this previously. Prior CT revealed epiploic appendagitis.  She has been treated previously with antibiotics and steroids with resolution.  Over the past 2 weeks she's had severe lower abdominal pain.  Located bilaterally.  She states it is worse with movement.  She has associated constipation. Constipation is chronic secondary to chronic narcotics.  No hematochezia or melena.  No nausea or vomiting.  No known relieving factors.  She saw GI in November of last year. At that time her prior symptoms or not to be of abdominal origin. Physician thought this may be secondary to her chronic back pain.  No other complaints or concerns at this time.  Social Hx   Social History   Social History  . Marital status: Married    Spouse name: N/A  . Number of children: 0  . 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 Smoking status: Former Smoker    Quit date: 08/29/2000  . Smokeless tobacco: Never Used  . Alcohol use No  . Drug use: No  . Sexual activity: Not Asked   Other Topics Concern  . None   Social History Narrative  . None    Review of Systems  Gastrointestinal: Positive for abdominal pain. Negative for blood in stool, nausea and vomiting.  Musculoskeletal: Positive for back pain.   Objective:  BP 138/80   Pulse 70   Temp 97.7 F (36.5 C) (Oral)   Wt 172 lb 4 oz (78.1 kg)   SpO2 98%   BMI 31.00 kg/m   BP/Weight 12/11/2016 06/20/2016 40/98/1191  Systolic BP 478 295 621  Diastolic BP 80 88 80  Wt. (Lbs) 172.25 174.25 172.25  BMI 31 31.36 30.51    Physical Exam  Constitutional: She is oriented to person, place, and  time. She appears well-developed. No distress.  Cardiovascular: Normal rate and regular rhythm.   Pulmonary/Chest: Effort normal and breath sounds normal.  Abdominal:  Soft, nondistended. Mildly tender to palpation in the right lower quadrant and left lower quadrant.  Neurological: She is alert and oriented to person, place, and time.  Psychiatric: She has a normal mood and affect.  Vitals reviewed.   Lab Results  Component Value Date   WBC 11.5 (H) 03/02/2016   HGB 14.0 03/02/2016   HCT 40.8 03/02/2016   PLT 264 03/02/2016   GLUCOSE 90 03/02/2016   CHOL 187 06/06/2016   TRIG 127 06/06/2016   HDL 46 06/06/2016   LDLCALC 116 (H) 06/06/2016   ALT 41 03/02/2016   AST 37 03/02/2016   NA 138 03/02/2016   K 3.9 03/02/2016   CL 101 03/02/2016   CREATININE 0.88 03/02/2016   BUN 17 03/02/2016   CO2 30 03/02/2016   INR 1.1 05/19/2014    Assessment & Plan:   Problem List Items Addressed This Visit    Lower abdominal pain    Acute on chronic. Etiology is unclear to me at this time. Given her history of Crohn's, I'm treating her with a prednisone taper. Advised her to see her GI physician yet again and discuss the merits of colonoscopy. No indication for imaging or antibiotics  at this time.          Meds ordered this encounter  Medications  . docusate sodium (DOK) 100 MG capsule    Sig: TAKE 1 CAPSULE(100 MG) BY MOUTH TWICE DAILY  . DISCONTD: valACYclovir (VALTREX) 1000 MG tablet  . Turmeric Curcumin 500 MG CAPS    Sig: Take by mouth.  Marland Kitchen glucosamine-chondroitin 500-400 MG tablet    Sig: Take by mouth.  . valACYclovir (VALTREX) 1000 MG tablet    Refill:  0  . predniSONE (DELTASONE) 10 MG tablet    Sig: 50 mg x 2 days, 40 mg x 2 days, 30 mg x 2 days, 20 mg x 2 days, 10 mg x 2 days.    Dispense:  30 tablet    Refill:  0     Follow-up: PRN  [place]

## 2016-12-11 NOTE — Patient Instructions (Signed)
Prednisone as prescribed.  Consider seeing GI again and having a colonoscopy.  Take care  Dr. Lacinda Axon

## 2016-12-11 NOTE — Assessment & Plan Note (Signed)
Acute on chronic. Etiology is unclear to me at this time. Given her history of Crohn's, I'm treating her with a prednisone taper. Advised her to see her GI physician yet again and discuss the merits of colonoscopy. No indication for imaging or antibiotics at this time.

## 2016-12-16 DIAGNOSIS — R5383 Other fatigue: Secondary | ICD-10-CM | POA: Insufficient documentation

## 2016-12-16 HISTORY — DX: Other fatigue: R53.83

## 2016-12-17 ENCOUNTER — Encounter: Payer: Self-pay | Admitting: Family Medicine

## 2016-12-29 ENCOUNTER — Other Ambulatory Visit: Payer: Self-pay | Admitting: Family Medicine

## 2016-12-29 ENCOUNTER — Telehealth: Payer: Self-pay | Admitting: Family Medicine

## 2016-12-29 DIAGNOSIS — R109 Unspecified abdominal pain: Secondary | ICD-10-CM

## 2016-12-29 NOTE — Telephone Encounter (Signed)
Placing orders for labs.   [place]

## 2017-01-01 ENCOUNTER — Other Ambulatory Visit: Payer: Self-pay

## 2017-01-01 MED ORDER — BUPROPION HCL ER (XL) 300 MG PO TB24: 300.0000 mg | ORAL_TABLET | Freq: Every day | ORAL | 1 refills | Status: DC

## 2017-01-03 LAB — LEAD, BLOOD (ADULT >= 16 YRS): LEAD-WHOLE BLOOD: NOT DETECTED ug/dL (ref 0–19)

## 2017-01-03 LAB — HEAVY METALS, BLOOD
Arsenic: 17 ug/L (ref 2–23)
Lead, Blood: NOT DETECTED ug/dL (ref 0–19)
Mercury: NOT DETECTED ug/L (ref 0.0–14.9)

## 2017-02-08 ENCOUNTER — Other Ambulatory Visit: Payer: Self-pay | Admitting: Family Medicine

## 2017-03-04 ENCOUNTER — Other Ambulatory Visit: Payer: Self-pay | Admitting: Family Medicine

## 2017-03-05 NOTE — Telephone Encounter (Signed)
Last refilled 08/21/16 for #30 with 2 refills, please advise for refill today, thanks

## 2017-04-09 ENCOUNTER — Encounter: Payer: Self-pay | Admitting: Family Medicine

## 2017-04-09 ENCOUNTER — Ambulatory Visit (INDEPENDENT_AMBULATORY_CARE_PROVIDER_SITE_OTHER): Payer: 59 | Admitting: Family Medicine

## 2017-04-09 VITALS — BP 118/84 | HR 74 | Temp 98.7°F | Wt 169.1 lb

## 2017-04-09 DIAGNOSIS — K50919 Crohn's disease, unspecified, with unspecified complications: Secondary | ICD-10-CM

## 2017-04-09 DIAGNOSIS — G4733 Obstructive sleep apnea (adult) (pediatric): Secondary | ICD-10-CM | POA: Diagnosis not present

## 2017-04-09 DIAGNOSIS — Z0000 Encounter for general adult medical examination without abnormal findings: Secondary | ICD-10-CM

## 2017-04-09 MED ORDER — OMEPRAZOLE 20 MG PO CPDR: 20.0000 mg | DELAYED_RELEASE_CAPSULE | Freq: Every day | ORAL | 1 refills | Status: DC

## 2017-04-09 NOTE — Patient Instructions (Signed)
Follow up annually.  Take care  Dr.    Health Maintenance, Female Adopting a healthy lifestyle and getting preventive care can go a long way to promote health and wellness. Talk with your health care provider about what schedule of regular examinations is right for you. This is a good chance for you to check in with your provider about disease prevention and staying healthy. In between checkups, there are plenty of things you can do on your own. Experts have done a lot of research about which lifestyle changes and preventive measures are most likely to keep you healthy. Ask your health care provider for more information. Weight and diet Eat a healthy diet  Be sure to include plenty of vegetables, fruits, low-fat dairy products, and lean protein.  Do not eat a lot of foods high in solid fats, added sugars, or salt.  Get regular exercise. This is one of the most important things you can do for your health. ? Most adults should exercise for at least 150 minutes each week. The exercise should increase your heart rate and make you sweat (moderate-intensity exercise). ? Most adults should also do strengthening exercises at least twice a week. This is in addition to the moderate-intensity exercise.  Maintain a healthy weight  Body mass index (BMI) is a measurement that can be used to identify possible weight problems. It estimates body fat based on height and weight. Your health care provider can help determine your BMI and help you achieve or maintain a healthy weight.  For females 20 years of age and older: ? A BMI below 18.5 is considered underweight. ? A BMI of 18.5 to 24.9 is normal. ? A BMI of 25 to 29.9 is considered overweight. ? A BMI of 30 and above is considered obese.  Watch levels of cholesterol and blood lipids  You should start having your blood tested for lipids and cholesterol at 51 years of age, then have this test every 5 years.  You may need to have your cholesterol  levels checked more often if: ? Your lipid or cholesterol levels are high. ? You are older than 50 years of age. ? You are at high risk for heart disease.  Cancer screening Lung Cancer  Lung cancer screening is recommended for adults 55-80 years old who are at high risk for lung cancer because of a history of smoking.  A yearly low-dose CT scan of the lungs is recommended for people who: ? Currently smoke. ? Have quit within the past 15 years. ? Have at least a 30-pack-year history of smoking. A pack year is smoking an average of one pack of cigarettes a day for 1 year.  Yearly screening should continue until it has been 15 years since you quit.  Yearly screening should stop if you develop a health problem that would prevent you from having lung cancer treatment.  Breast Cancer  Practice breast self-awareness. This means understanding how your breasts normally appear and feel.  It also means doing regular breast self-exams. Let your health care provider know about any changes, no matter how small.  If you are in your 20s or 30s, you should have a clinical breast exam (CBE) by a health care provider every 1-3 years as part of a regular health exam.  If you are 40 or older, have a CBE every year. Also consider having a breast X-ray (mammogram) every year.  If you have a family history of breast cancer, talk to your health care   provider about genetic screening.  If you are at high risk for breast cancer, talk to your health care provider about having an MRI and a mammogram every year.  Breast cancer gene (BRCA) assessment is recommended for women who have family members with BRCA-related cancers. BRCA-related cancers include: ? Breast. ? Ovarian. ? Tubal. ? Peritoneal cancers.  Results of the assessment will determine the need for genetic counseling and BRCA1 and BRCA2 testing.  Cervical Cancer Your health care provider may recommend that you be screened regularly for cancer of  the pelvic organs (ovaries, uterus, and vagina). This screening involves a pelvic examination, including checking for microscopic changes to the surface of your cervix (Pap test). You may be encouraged to have this screening done every 3 years, beginning at age 21.  For women ages 33-65, health care providers may recommend pelvic exams and Pap testing every 3 years, or they may recommend the Pap and pelvic exam, combined with testing for human papilloma virus (HPV), every 5 years. Some types of HPV increase your risk of cervical cancer. Testing for HPV may also be done on women of any age with unclear Pap test results.  Other health care providers may not recommend any screening for nonpregnant women who are considered low risk for pelvic cancer and who do not have symptoms. Ask your health care provider if a screening pelvic exam is right for you.  If you have had past treatment for cervical cancer or a condition that could lead to cancer, you need Pap tests and screening for cancer for at least 20 years after your treatment. If Pap tests have been discontinued, your risk factors (such as having a new sexual partner) need to be reassessed to determine if screening should resume. Some women have medical problems that increase the chance of getting cervical cancer. In these cases, your health care provider may recommend more frequent screening and Pap tests.  Colorectal Cancer  This type of cancer can be detected and often prevented.  Routine colorectal cancer screening usually begins at 51 years of age and continues through 51 years of age.  Your health care provider may recommend screening at an earlier age if you have risk factors for colon cancer.  Your health care provider may also recommend using home test kits to check for hidden blood in the stool.  A small camera at the end of a tube can be used to examine your colon directly (sigmoidoscopy or colonoscopy). This is done to check for the  earliest forms of colorectal cancer.  Routine screening usually begins at age 24.  Direct examination of the colon should be repeated every 5-10 years through 51 years of age. However, you may need to be screened more often if early forms of precancerous polyps or small growths are found.  Skin Cancer  Check your skin from head to toe regularly.  Tell your health care provider about any new moles or changes in moles, especially if there is a change in a mole's shape or color.  Also tell your health care provider if you have a mole that is larger than the size of a pencil eraser.  Always use sunscreen. Apply sunscreen liberally and repeatedly throughout the day.  Protect yourself by wearing long sleeves, pants, a wide-brimmed hat, and sunglasses whenever you are outside.  Heart disease, diabetes, and high blood pressure  High blood pressure causes heart disease and increases the risk of stroke. High blood pressure is more likely to develop in: ?  People who have blood pressure in the high end of the normal range (130-139/85-89 mm Hg). ? People who are overweight or obese. ? People who are African American.  If you are 53-65 years of age, have your blood pressure checked every 3-5 years. If you are 68 years of age or older, have your blood pressure checked every year. You should have your blood pressure measured twice-once when you are at a hospital or clinic, and once when you are not at a hospital or clinic. Record the average of the two measurements. To check your blood pressure when you are not at a hospital or clinic, you can use: ? An automated blood pressure machine at a pharmacy. ? A home blood pressure monitor.  If you are between 11 years and 76 years old, ask your health care provider if you should take aspirin to prevent strokes.  Have regular diabetes screenings. This involves taking a blood sample to check your fasting blood sugar level. ? If you are at a normal weight and  have a low risk for diabetes, have this test once every three years after 51 years of age. ? If you are overweight and have a high risk for diabetes, consider being tested at a younger age or more often. Preventing infection Hepatitis B  If you have a higher risk for hepatitis B, you should be screened for this virus. You are considered at high risk for hepatitis B if: ? You were born in a country where hepatitis B is common. Ask your health care provider which countries are considered high risk. ? Your parents were born in a high-risk country, and you have not been immunized against hepatitis B (hepatitis B vaccine). ? You have HIV or AIDS. ? You use needles to inject street drugs. ? You live with someone who has hepatitis B. ? You have had sex with someone who has hepatitis B. ? You get hemodialysis treatment. ? You take certain medicines for conditions, including cancer, organ transplantation, and autoimmune conditions.  Hepatitis C  Blood testing is recommended for: ? Everyone born from [birth year] through 1965. ? Anyone with known risk factors for hepatitis C.  Sexually transmitted infections (STIs)  You should be screened for sexually transmitted infections (STIs) including gonorrhea and chlamydia if: ? You are sexually active and are younger than 51 years of age. ? You are older than 51 years of age and your health care provider tells you that you are at risk for this type of infection. ? Your sexual activity has changed since you were last screened and you are at an increased risk for chlamydia or gonorrhea. Ask your health care provider if you are at risk.  If you do not have HIV, but are at risk, it may be recommended that you take a prescription medicine daily to prevent HIV infection. This is called pre-exposure prophylaxis (PrEP). You are considered at risk if: ? You are sexually active and do not regularly use condoms or know the HIV status of your partner(s). ? You take drugs by  injection. ? You are sexually active with a partner who has HIV.  Talk with your health care provider about whether you are at high risk of being infected with HIV. If you choose to begin PrEP, you should first be tested for HIV. You should then be tested every 3 months for as long as you are taking PrEP. Pregnancy  If you are premenopausal and you may become pregnant, ask your health  care provider about preconception counseling.  If you may become pregnant, take 400 to 800 micrograms (mcg) of folic acid every day.  If you want to prevent pregnancy, talk to your health care provider about birth control (contraception). Osteoporosis and menopause  Osteoporosis is a disease in which the bones lose minerals and strength with aging. This can result in serious bone fractures. Your risk for osteoporosis can be identified using a bone density scan.  If you are 50 years of age or older, or if you are at risk for osteoporosis and fractures, ask your health care provider if you should be screened.  Ask your health care provider whether you should take a calcium or vitamin D supplement to lower your risk for osteoporosis.  Menopause may have certain physical symptoms and risks.  Hormone replacement therapy may reduce some of these symptoms and risks. Talk to your health care provider about whether hormone replacement therapy is right for you. Follow these instructions at home:  Schedule regular health, dental, and eye exams.  Stay current with your immunizations.  Do not use any tobacco products including cigarettes, chewing tobacco, or electronic cigarettes.  If you are pregnant, do not drink alcohol.  If you are breastfeeding, limit how much and how often you drink alcohol.  Limit alcohol intake to no more than 1 drink per day for nonpregnant women. One drink equals 12 ounces of beer, 5 ounces of wine, or 1 ounces of hard liquor.  Do not use street drugs.  Do not share needles.  Ask  your health care provider for help if you need support or information about quitting drugs.  Tell your health care provider if you often feel depressed.  Tell your health care provider if you have ever been abused or do not feel safe at home. This information is not intended to replace advice given to you by your health care provider. Make sure you discuss any questions you have with your health care provider. Document Released: 01/30/2011 Document Revised: 12/23/2015 Document Reviewed: 04/20/2015 Elsevier Interactive Patient Education  [employer].

## 2017-04-10 DIAGNOSIS — Z0000 Encounter for general adult medical examination without abnormal findings: Secondary | ICD-10-CM

## 2017-04-10 HISTORY — DX: Encounter for general adult medical examination without abnormal findings: Z00.00

## 2017-04-10 MED ORDER — OMEPRAZOLE 20 MG PO CPDR: 20.0000 mg | DELAYED_RELEASE_CAPSULE | Freq: Every day | ORAL | 1 refills | Status: DC

## 2017-04-10 NOTE — Progress Notes (Addendum)
Subjective:  Patient ID: Carolyn Esparza, female    DOB: Aug 02, 1965  Age: 51 y.o. MRN: [id number]  CC: Annual physical  HPI Carolyn Esparza is a 51 y.o. female presents to the clinic today for an annual physical.  Preventative Healthcare  Pap smear: Up to date.   Mammogram: Due for later this year (Nov).  Colonoscopy: Up to date.  Immunizations  Tetanus - Declines.  Flu - Declines.  Labs: Labs today.  Exercise: Recently started exercising to lose weight.  Alcohol use: No.  Smoking/tobacco use: Former.   STD/HIV testing: Declines.  PMH, Surgical Hx, Family Hx, Social History reviewed and updated as below.  Past Medical History:  Diagnosis Date  . Anxiety   . CAD (coronary artery disease)   . Chicken pox   . Depression   . GERD (gastroesophageal reflux disease)   . History of Crohn's disease   . Hyperlipidemia   . Thyroid nodule    Past Surgical History:  Procedure Laterality Date  . ABDOMINAL HYSTERECTOMY    . APPENDECTOMY  2004  . BLADDER SURGERY    . BREAST BIOPSY     x 2  . WISDOM TOOTH EXTRACTION     Family History  Problem Relation Age of Onset  . Alcohol abuse Mother   . Hyperlipidemia Mother   . Heart disease Mother   . Hypertension Mother   . Diabetes Mother   . Crohn's disease Sister        1/2 sister  . Leukemia Paternal Grandmother   . Cancer Paternal Aunt        blood, type unknown   Social History  Substance Use Topics  . Smoking status: Former Smoker    Quit date: 08/29/2000  . Smokeless tobacco: Never Used  . Alcohol use No   Review of Systems  Constitutional: Positive for fatigue.  Gastrointestinal: Negative.   All other systems reviewed and are negative.   Objective:   Today's Vitals: BP 118/84 (BP Location: Left Arm, Patient Position: Sitting, Cuff Size: Normal)   Pulse 74   Temp 98.7 F (37.1 C) (Oral)   Wt 169 lb 2 oz (76.7 kg)   SpO2 98%   BMI 30.44 kg/m   Physical Exam  Constitutional: She is oriented to  person, place, and time. She appears well-developed and well-nourished. No distress.  HENT:  Head: Normocephalic and atraumatic.  Nose: Nose normal.  Mouth/Throat: Oropharynx is clear and moist. No oropharyngeal exudate.  Normal TM's bilaterally.   Eyes: Conjunctivae are normal. No scleral icterus.  Neck: Neck supple.  Cardiovascular: Normal rate and regular rhythm.   No murmur heard. Pulmonary/Chest: Effort normal and breath sounds normal. She has no wheezes. She has no rales.  Abdominal: Soft. She exhibits no distension. There is no tenderness. There is no rebound and no guarding.  Musculoskeletal: Normal range of motion. She exhibits no edema.  Lymphadenopathy:    She has no cervical adenopathy.  Neurological: She is alert and oriented to person, place, and time.  Skin: Skin is warm and dry. No rash noted.  Psychiatric: She has a normal mood and affect.  Vitals reviewed.  Assessment & Plan:   Problem List Items Addressed This Visit    Annual physical exam - Primary    Doing well. Labs today. Advised to get mammogram later this year.  Declines vaccines today. Advised weight loss.       Crohn's disease ([place])   Relevant Orders   CBC   Comprehensive  metabolic panel   OSA (obstructive sleep apnea)    Experiencing fatigue and has not been using CPAP. Wants to restart. Needs new supplies.         Meds ordered this encounter  Medications  . DISCONTD: omeprazole (PRILOSEC) 20 MG capsule    Sig: Take 1 capsule (20 mg total) by mouth daily.    Dispense:  90 capsule    Refill:  1   Follow-up: Annually  Reader

## 2017-04-10 NOTE — Assessment & Plan Note (Signed)
Doing well. Labs today. Advised to get mammogram later this year.  Declines vaccines today. Advised weight loss.

## 2017-04-16 ENCOUNTER — Encounter: Payer: Self-pay | Admitting: Family Medicine

## 2017-04-18 DIAGNOSIS — G4733 Obstructive sleep apnea (adult) (pediatric): Secondary | ICD-10-CM | POA: Insufficient documentation

## 2017-04-18 NOTE — Assessment & Plan Note (Signed)
Experiencing fatigue and has not been using CPAP. Wants to restart. Needs new supplies.

## 2017-04-24 LAB — COMPREHENSIVE METABOLIC PANEL
ALT: 25 IU/L (ref 0–32)
AST: 25 IU/L (ref 0–40)
Albumin/Globulin Ratio: 1.8 (ref 1.2–2.2)
Albumin: 4.5 g/dL (ref 3.5–5.5)
Alkaline Phosphatase: 71 IU/L (ref 39–117)
BUN / CREAT RATIO: 14 (ref 9–23)
BUN: 15 mg/dL (ref 6–24)
Bilirubin Total: 0.3 mg/dL (ref 0.0–1.2)
CALCIUM: 9.7 mg/dL (ref 8.7–10.2)
CO2: 25 mmol/L (ref 20–29)
CREATININE: 1.06 mg/dL — AB (ref 0.57–1.00)
Chloride: 100 mmol/L (ref 96–106)
GFR, EST AFRICAN AMERICAN: 70 mL/min/{1.73_m2} (ref >59)
GFR, EST NON AFRICAN AMERICAN: 61 mL/min/{1.73_m2} (ref >59)
GLOBULIN, TOTAL: 2.5 g/dL (ref 1.5–4.5)
Glucose: 77 mg/dL (ref 65–99)
POTASSIUM: 3.7 mmol/L (ref 3.5–5.2)
SODIUM: 141 mmol/L (ref 134–144)
Total Protein: 7 g/dL (ref 6.0–8.5)

## 2017-04-24 LAB — CBC
HEMATOCRIT: 42.8 % (ref 34.0–46.6)
Hemoglobin: 14.1 g/dL (ref 11.1–15.9)
MCH: 30.3 pg (ref 26.6–33.0)
MCHC: 32.9 g/dL (ref 31.5–35.7)
MCV: 92 fL (ref 79–97)
Platelets: 287 10*3/uL (ref 150–379)
RBC: 4.65 x10E6/uL (ref 3.77–5.28)
RDW: 12.6 % (ref 12.3–15.4)
WBC: 10.5 10*3/uL (ref 3.4–10.8)

## 2017-04-25 NOTE — Telephone Encounter (Signed)
Eric [phone] from [facility] will be refaxing the order form which needs to be signed by doctor. Fax to [phone]. Thank you!

## 2017-04-26 ENCOUNTER — Encounter: Payer: Self-pay | Admitting: Family Medicine

## 2017-04-26 DIAGNOSIS — N179 Acute kidney failure, unspecified: Secondary | ICD-10-CM

## 2017-04-27 NOTE — Telephone Encounter (Signed)
Spoke with Carolyn Esparza at [facility] they will be faxing over order for you to sign in regards to CPAP supplies .   Order wasn't received prior to Dr Lacinda Axon leaving.

## 2017-04-29 ENCOUNTER — Encounter: Payer: Self-pay | Admitting: Family Medicine

## 2017-04-30 ENCOUNTER — Telehealth: Payer: Self-pay | Admitting: *Deleted

## 2017-04-30 NOTE — Telephone Encounter (Signed)
Pt requested to know if the form was received in regards to her Cpap equipment  Pt contact [phone]

## 2017-05-01 NOTE — Telephone Encounter (Signed)
Can you complete form for CPAP supplies , since she was established with Dr Lacinda Axon? Patient plans to establish with new PCP Dr Aundra Dubin Form placed in your folder for completion

## 2017-05-02 NOTE — Telephone Encounter (Signed)
Form received and given to Dr Caryl Bis to be completed. Patient aware.

## 2017-05-03 NOTE — Telephone Encounter (Signed)
Advised patient still awaiting signature

## 2017-05-03 NOTE — Telephone Encounter (Signed)
Pt has requested an update on this form Pt contact [phone]

## 2017-05-07 ENCOUNTER — Other Ambulatory Visit: Payer: Self-pay | Admitting: Obstetrics & Gynecology

## 2017-05-07 DIAGNOSIS — Z1231 Encounter for screening mammogram for malignant neoplasm of breast: Secondary | ICD-10-CM

## 2017-05-24 ENCOUNTER — Other Ambulatory Visit: Payer: Self-pay | Admitting: Family Medicine

## 2017-05-24 NOTE — Telephone Encounter (Signed)
Pt called and wanted to schedule this appt. No orders in. Please advise, thank you!

## 2017-05-25 NOTE — Telephone Encounter (Signed)
Please see what she takes this medication for as it appears that this is an as needed medication. Thanks.

## 2017-05-25 NOTE — Telephone Encounter (Signed)
Last OV Was 04/09/17, Last refill was 03/05/17, #30 with no refills, no up coming appt.  Please advise, thanks

## 2017-05-26 NOTE — Telephone Encounter (Signed)
Order placed

## 2017-05-28 ENCOUNTER — Ambulatory Visit (INDEPENDENT_AMBULATORY_CARE_PROVIDER_SITE_OTHER): Payer: 59

## 2017-05-28 ENCOUNTER — Other Ambulatory Visit (INDEPENDENT_AMBULATORY_CARE_PROVIDER_SITE_OTHER): Payer: 59

## 2017-05-28 DIAGNOSIS — N179 Acute kidney failure, unspecified: Secondary | ICD-10-CM

## 2017-05-28 DIAGNOSIS — Z23 Encounter for immunization: Secondary | ICD-10-CM | POA: Diagnosis not present

## 2017-05-28 NOTE — Telephone Encounter (Signed)
Sent to pharmacy. Please see why she has nausea that requires Phenergan. Is at this related to her Crohn's or some other issue. Please also see if she received her CPAP supplies that she previously requested paperwork for. Thanks.

## 2017-05-28 NOTE — Telephone Encounter (Signed)
Spoke with the patient, she takes it for nausea, not everyday per the patient.  Thanks Please advise,

## 2017-05-29 ENCOUNTER — Other Ambulatory Visit: Payer: 59

## 2017-05-29 LAB — CREATININE, SERUM
CREATININE: 0.88 mg/dL (ref 0.57–1.00)
GFR calc Af Amer: 88 mL/min/{1.73_m2} (ref >59)
GFR calc non Af Amer: 76 mL/min/{1.73_m2} (ref >59)

## 2017-05-29 NOTE — Telephone Encounter (Signed)
Medication related nausea not directly related to the Crohn's.  Has received the Cpap Supplies and appreciated all the hard work into that. thanks

## 2017-05-30 NOTE — Telephone Encounter (Signed)
Paperwork completed and fa

## 2017-05-30 NOTE — Telephone Encounter (Signed)
Form faxed

## 2017-05-30 NOTE — Telephone Encounter (Signed)
Paperwork signed by Dr Caryl Bis and faxed.

## 2017-06-02 ENCOUNTER — Encounter: Payer: Self-pay | Admitting: Family Medicine

## 2017-06-04 ENCOUNTER — Other Ambulatory Visit: Payer: Self-pay | Admitting: *Deleted

## 2017-06-04 MED ORDER — BUPROPION HCL ER (XL) 300 MG PO TB24: 300.0000 mg | ORAL_TABLET | Freq: Every day | ORAL | 0 refills | Status: DC

## 2017-06-04 NOTE — Progress Notes (Unsigned)
Refilled 30 day supply 

## 2017-06-08 ENCOUNTER — Telehealth: Payer: Self-pay | Admitting: Family Medicine

## 2017-06-08 NOTE — Telephone Encounter (Signed)
Copied from [place] [id number]. Topic: Quick Communication - See Telephone Encounter >> Jun 08, 2017  3:33 PM Ebony Hail wrote: CRM for notification. See Telephone encounter for:  [facility] calling for rx refill buPROPion (WELLBUTRIN XL) 300 MG 24 hr tablet [[id number]]///sent by fax twice// last attempt for refill approval 06/08/17.  CB#: [phone]// Ref. #: [id number]

## 2017-06-08 NOTE — Telephone Encounter (Signed)
Refill sent to [facility]. Pt aware.

## 2017-06-25 ENCOUNTER — Ambulatory Visit: Payer: 59 | Admitting: Internal Medicine

## 2017-06-25 ENCOUNTER — Encounter: Payer: Self-pay | Admitting: Internal Medicine

## 2017-06-25 VITALS — BP 110/84 | HR 66 | Ht 63.0 in | Wt 167.4 lb

## 2017-06-25 DIAGNOSIS — F419 Anxiety disorder, unspecified: Secondary | ICD-10-CM

## 2017-06-25 DIAGNOSIS — G8929 Other chronic pain: Secondary | ICD-10-CM | POA: Diagnosis not present

## 2017-06-25 DIAGNOSIS — F329 Major depressive disorder, single episode, unspecified: Secondary | ICD-10-CM | POA: Diagnosis not present

## 2017-06-25 DIAGNOSIS — E041 Nontoxic single thyroid nodule: Secondary | ICD-10-CM

## 2017-06-25 DIAGNOSIS — R1030 Lower abdominal pain, unspecified: Secondary | ICD-10-CM | POA: Diagnosis not present

## 2017-06-25 DIAGNOSIS — R110 Nausea: Secondary | ICD-10-CM | POA: Insufficient documentation

## 2017-06-25 DIAGNOSIS — Z8669 Personal history of other diseases of the nervous system and sense organs: Secondary | ICD-10-CM | POA: Diagnosis not present

## 2017-06-25 DIAGNOSIS — M545 Low back pain: Secondary | ICD-10-CM | POA: Diagnosis not present

## 2017-06-25 DIAGNOSIS — F32A Depression, unspecified: Secondary | ICD-10-CM

## 2017-06-25 DIAGNOSIS — M25551 Pain in right hip: Secondary | ICD-10-CM

## 2017-06-25 HISTORY — DX: Nausea: R11.0

## 2017-06-25 HISTORY — DX: Other chronic pain: G89.29

## 2017-06-25 MED ORDER — SERTRALINE HCL 50 MG PO TABS: 50.0000 mg | ORAL_TABLET | Freq: Every day | ORAL | 2 refills | Status: DC

## 2017-06-25 NOTE — Progress Notes (Signed)
Chief Complaint  Patient presents with  . Follow-up   F/u  1. Depression>anxiety uncontrolled very emotional with crying spells. She also worries about her health and the fact her husband is 51 y.o and older than her and may die before she will. Depression ongoing for a while and has been on and off depression meds. She was supposed to take Wellbutrin XL 300 mg daily but has been taking 1 and 1/4th pill. When she takes that she feels like it is too much and causes nausea and abdominal pain. At the max 450 XL Wellbutrin was too much.  She has tried Cymbalta in the past but did not like 2/2 wt gain. Wellbutrin has helped her quit smoking and quit in 20s and also again in 2002. She denies any family members who have been tx'ed with antidepressants in her family that she is aware At one time she was on Paxil and it worked.  She has been to therapy in the past as well.  Denies SI. PHQ 2 0 and PHQ 9 13 today. She also reports increased sleep with mood worsening.  Another trigger to mood is fact she is on chronic pain meds. She states.   2. Ab pain and nausea. Ab pain is 3/10 worse with movement and lower abdomen. She reports she has been told before it could be MSK.  She takes phenergan prn for nausea. She denies diarrhea. She had colonoscopy 8527 and had complications with propofol had "mild MI" and is scared to have them done in the future and does not want to go back to Dr. Harl Bowie though she thinks she is great She reports FH Crohns and with that [facility] they thought her sx's were Crohns related b/c her 1/2 sister has crohns   3. She c/o right hip pain last xray years ago. Hip pain radiates down right thigh. She does not want to w/u at this time and has chronic back pain and intersted in platelet rich plasma or cord blood derived stem cell transplant will check to see if ortho spine does this in [place]. She learned about this from a chiropractor in [facility]  4. OSA on cpap she feels tired in am and  waking up throughout the night and like she is breaking the seal. She feels like the settings need to be checked.  Last sleep study 2014 she has copy. She had a [facility] in [place]. She does report she was off CPAP 1.5-2 years then resumed and went through [facility] to get equip in [facility].    5. H/o thyroid nodule noted years ago at [facility].       Review of Systems  Constitutional: Negative for weight loss.  Respiratory: Negative for shortness of breath.   Cardiovascular: Negative for chest pain.  Gastrointestinal: Positive for abdominal pain, constipation and nausea. Negative for diarrhea and vomiting.  Musculoskeletal: Positive for joint pain.  Psychiatric/Behavioral: Positive for depression. Negative for suicidal ideas. The patient is nervous/anxious.    Past Medical History:  Diagnosis Date  . Anxiety   . CAD (coronary artery disease)   . Chicken pox   . Depression   . GERD (gastroesophageal reflux disease)   . History of Crohn's disease   . Hyperlipidemia   . Thyroid nodule    Past Surgical History:  Procedure Laterality Date  . ABDOMINAL HYSTERECTOMY    . APPENDECTOMY  2004  . BLADDER SURGERY    . BREAST BIOPSY  x 2  . WISDOM TOOTH EXTRACTION     Family History  Problem Relation Age of Onset  . Alcohol abuse Mother   . Hyperlipidemia Mother   . Heart disease Mother   . Hypertension Mother   . Diabetes Mother   . Crohn's disease Sister        1/2 sister  . Leukemia Paternal Grandmother   . Cancer Paternal Aunt        blood, type unknown   Social History   Socioeconomic History  . Marital status: Married    Spouse name: Not on file  . Number of children: 0  . Years of education: Not on file  . Highest education level: Not on file  Social Needs  . Financial resource strain: Not on file  . Food insecurity - worry: Not on file  . Food insecurity - inability: Not on file  . Transportation needs - medical: Not on file  . Transportation needs -  non-medical: Not on file  Occupational History  . Occupation: Public relations account executive  Tobacco Use  . Smoking status: Former Smoker    Last attempt to quit: 08/29/2000    Years since quitting: 16.8  . Smokeless tobacco: Never Used  Substance and Sexual Activity  . Alcohol use: No    Alcohol/week: 0.0 oz  . Drug use: No  . Sexual activity: Not on file  Other Topics Concern  . Not on file  Social History Narrative   Married    Works [employer]    Current Meds  Medication Sig  . Ascorbic Acid (VITAMIN C) 1000 MG tablet Take 1,000 mg by mouth daily.   Marland Kitchen aspirin EC 81 MG tablet Take 81 mg by mouth daily.  Marland Kitchen atorvastatin (LIPITOR) 10 MG tablet Take 1 tablet (10 mg total) by mouth daily.  Marland Kitchen buPROPion (WELLBUTRIN XL) 300 MG 24 hr tablet Take 1 tablet (300 mg total) daily by mouth. (Patient taking differently: Take 300 mg by mouth daily. )  . docusate sodium (DOK) 100 MG capsule TAKE 1 CAPSULE(100 MG) BY MOUTH TWICE DAILY  . estradiol (CLIMARA - DOSED IN MG/24 HR) 0.05 mg/24hr patch Place 0.05 mg onto the skin once a week.   . fentaNYL (DURAGESIC - DOSED MCG/HR) 25 MCG/HR patch Place 25 mcg onto the skin every 3 (three) days.   Marland Kitchen HYDROcodone-acetaminophen (NORCO) 10-325 MG tablet Take 1 tablet by mouth every 6 (six) hours as needed.   . lidocaine (LIDODERM) 5 % Place 2 patches onto the skin daily.   . Multiple Vitamin (MULTI-VITAMINS) TABS Take 1 tablet by mouth daily.   . Omega-3 Fatty Acids (FISH OIL PO) Take 1 capsule by mouth daily.   Marland Kitchen omeprazole (PRILOSEC) 20 MG capsule Take 1 capsule (20 mg total) by mouth daily.  . promethazine (PHENERGAN) 25 MG tablet TAKE 1 TABLET(25 MG) BY MOUTH EVERY 8 HOURS AS NEEDED FOR NAUSEA OR VOMITING  . Turmeric Curcumin 500 MG CAPS Take by mouth.  . valACYclovir (VALTREX) 1000 MG tablet    Allergies  Allergen Reactions  . Propofol Palpitations    BP drops and EKG changes, bladder retention  . Nsaids Other (See Comments)    Flare of Crohn's.  . Pentasa  [Mesalamine] Other (See Comments)    Chest pain.   Recent Results (from the past 2160 hour(s))  CBC     Status: None   Collection Time: 04/23/17  7:49 AM  Result Value Ref Range   WBC 10.5 3.4 - 10.8  x10E3/uL   RBC 4.65 3.77 - 5.28 x10E6/uL   Hemoglobin 14.1 11.1 - 15.9 g/dL   Hematocrit 42.8 34.0 - 46.6 %   MCV 92 79 - 97 fL   MCH 30.3 26.6 - 33.0 pg   MCHC 32.9 31.5 - 35.7 g/dL   RDW 12.6 12.3 - 15.4 %   Platelets 287 150 - 379 x10E3/uL  Comprehensive metabolic panel     Status: Abnormal   Collection Time: 04/23/17  7:49 AM  Result Value Ref Range   Glucose 77 65 - 99 mg/dL   BUN 15 6 - 24 mg/dL   Creatinine, Ser 1.06 (H) 0.57 - 1.00 mg/dL   GFR calc non Af Amer 61 >59 mL/min/1.73   GFR calc Af Amer 70 >59 mL/min/1.73   BUN/Creatinine Ratio 14 9 - 23   Sodium 141 134 - 144 mmol/L   Potassium 3.7 3.5 - 5.2 mmol/L   Chloride 100 96 - 106 mmol/L   CO2 25 20 - 29 mmol/L   Calcium 9.7 8.7 - 10.2 mg/dL   Total Protein 7.0 6.0 - 8.5 g/dL   Albumin 4.5 3.5 - 5.5 g/dL   Globulin, Total 2.5 1.5 - 4.5 g/dL   Albumin/Globulin Ratio 1.8 1.2 - 2.2   Bilirubin Total 0.3 0.0 - 1.2 mg/dL   Alkaline Phosphatase 71 39 - 117 IU/L   AST 25 0 - 40 IU/L   ALT 25 0 - 32 IU/L  Creatinine     Status: None   Collection Time: 05/28/17  2:56 PM  Result Value Ref Range   Creatinine, Ser 0.88 0.57 - 1.00 mg/dL   GFR calc non Af Amer 76 >59 mL/min/1.73   GFR calc Af Amer 88 >59 mL/min/1.73   Objective  Body mass index is 29.65 kg/m. Wt Readings from Last 3 Encounters:  06/25/17 167 lb 6 oz (75.9 kg)  04/09/17 169 lb 2 oz (76.7 kg)  12/11/16 172 lb 4 oz (78.1 kg)   Temp Readings from Last 3 Encounters:  04/09/17 98.7 F (37.1 C) (Oral)  12/11/16 97.7 F (36.5 C) (Oral)  05/30/16 98.1 F (36.7 C) (Oral)   BP Readings from Last 3 Encounters:  06/25/17 110/84  04/09/17 118/84  12/11/16 138/80   Pulse Readings from Last 3 Encounters:  06/25/17 66  04/09/17 74  12/11/16 70    Physical Exam  Constitutional: She is oriented to person, place, and time and well-developed, well-nourished, and in no distress.  HENT:  Head: Normocephalic and atraumatic.  Mouth/Throat: Oropharynx is clear and moist and mucous membranes are normal.  Eyes: Conjunctivae are normal. Pupils are equal, round, and reactive to light.  Cardiovascular: Normal rate and regular rhythm.  No murmur heard. Pulmonary/Chest: Effort normal and breath sounds normal.  Abdominal: Soft. Bowel sounds are normal. There is tenderness in the right lower quadrant and left lower quadrant. There is no rebound and no guarding.  Neurological: She is alert and oriented to person, place, and time. She has normal motor skills. Gait normal.  Skin: Skin is warm, dry and intact.  Psychiatric: Memory, affect and judgment normal. She exhibits a depressed mood.  Tearful on exam   Nursing note and vitals reviewed.  Assessment   1. Depression uncontrolled>anxiety  2. Chronic back pain and right hip pain  3. Abdominal pain and nausea ddx ? H/o crohns  Vs liver hemangioma noted 03/02/16 increasing in size vs constipation 2/2 pain meds vs other I.e H pylori 02/05/15 IgM + 11.1  4.  H/o thyroid nodule noted in overview 12/15/15 0.6 cm left stable  5. OSA on cpap  6. HM Plan  1.  Continue Wellbutrin 300 mg XL add Zoloft 50 mg qd checked DDI no interactions. Prev been on paxil but causes DDI  cymbalta caused wt gain so did not like  Will refer to [facility] in [place]  2.  Continue to f/u pain clinic in [facility] to do Xray right hip pt wants to wait for now  Pt interested in who can do platelet rich plasma inj cpt code 0232T $750 or cord blood derived stem cell transplant cpt S2142 $6550 for chronic back pain  Will check with ortho spine to see if they do this  3. Offered to further w/u pt wants to hold for now  Would rec that she f/u with GI in future as well for noted above last colonoscopy  was 2015. She is established with [facility]  Consider check H pylori serum in future after reviewing prev labs  4.  Check TSH, free T4 per pt had scan with [place] in past  Consider thyroid US in future to further follow up in future cant seem to find US thyroid in care everywhere  5.  Will need to call [facility] to see if someone can look at her machine vs does she qualify for repeat sleep study  6.  Had flu shot  Tdap due 04/10/18  Consider shingrix in futuer  Check hep B status with labs as well as lipid, UA, TSH, free T4 to to be done [facility] filled out requisition today and given to pt.   mammo sch tomorrow  Colonoscopy had 2015 see #3 (per pt had 2 or 3 colonoscopies) Pap appt with Dr. 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 Olivia Mackie [name]

## 2017-06-25 NOTE — Patient Instructions (Addendum)
Please call [facility] so they can check settings on cpap [phone]   Try Zoloft 50 mg in am follow up in 4-6 weeks we will refer you to [place] in [place]    Tdap Vaccine (Tetanus, Diphtheria and Pertussis): What You Need to Know 1. Why get vaccinated? Tetanus, diphtheria and pertussis are very serious diseases. Tdap vaccine can protect [place] from these diseases. And, Tdap vaccine given to pregnant women can protect newborn babies against pertussis. TETANUS (Lockjaw) is rare in the [place] today. It causes painful muscle tightening and stiffness, usually all over the body.  It can lead to tightening of muscles in the head and neck so you can't open your mouth, swallow, or sometimes even breathe. Tetanus kills about 1 out of 10 people who are infected even after receiving the best medical care.  DIPHTHERIA is also rare in the [place] today. It can cause a thick coating to form in the back of the throat.  It can lead to breathing problems, heart failure, paralysis, and death.  PERTUSSIS (Whooping Cough) causes severe coughing spells, which can cause difficulty breathing, vomiting and disturbed sleep.  It can also lead to weight loss, incontinence, and rib fractures. Up to 2 in 100 adolescents and 5 in 100 adults with pertussis are hospitalized or have complications, which could include pneumonia or death.  These diseases are caused by bacteria. Diphtheria and pertussis are spread from person to person through secretions from coughing or sneezing. Tetanus enters the body through cuts, scratches, or wounds. Before vaccines, as many as 200,000 cases of diphtheria, 200,000 cases of pertussis, and hundreds of cases of tetanus, were reported in the [place] each year. Since vaccination began, reports of cases for tetanus and diphtheria have dropped by about 99% and for pertussis by about 80%. 2. Tdap vaccine Tdap vaccine can protect adolescents and adults from  tetanus, diphtheria, and pertussis. One dose of Tdap is routinely given at age 43 or 54. People who did not get Tdap at that age should get it as soon as possible. Tdap is especially important for healthcare professionals and anyone having close contact with a baby younger than 12 months. Pregnant women should get a dose of Tdap during every pregnancy, to protect the newborn from pertussis. Infants are most at risk for severe, life-threatening complications from pertussis. Another vaccine, called Td, protects against tetanus and diphtheria, but not pertussis. A Td booster should be given every 10 years. Tdap may be given as one of these boosters if you have never gotten Tdap before. Tdap may also be given after a severe cut or burn to prevent tetanus infection. Your doctor or the person giving you the vaccine can give you more information. Tdap may safely be given at the same time as other vaccines. 3. Some people should not get this vaccine  A person who has ever had a life-threatening allergic reaction after a previous dose of any diphtheria, tetanus or pertussis containing vaccine, OR has a severe allergy to any part of this vaccine, should not get Tdap vaccine. Tell the person giving the vaccine about any severe allergies.  Anyone who had coma or long repeated seizures within 7 days after a childhood dose of DTP or DTaP, or a previous dose of Tdap, should not get Tdap, unless a cause other than the vaccine was found. They can still get Td.  Talk to your doctor if you: ? have seizures or another nervous system problem, ?  had severe pain or swelling after any vaccine containing diphtheria, tetanus or pertussis, ? ever had a condition called Guillain-Barr Syndrome (GBS), ? aren't feeling well on the day the shot is scheduled. 4. Risks With any medicine, including vaccines, there is a chance of side effects. These are usually mild and go away on their own. Serious reactions are also possible but  are rare. Most people who get Tdap vaccine do not have any problems with it. Mild problems following Tdap: (Did not interfere with activities)  Pain where the shot was given (about 3 in 4 adolescents or 2 in 3 adults)  Redness or swelling where the shot was given (about 1 person in 5)  Mild fever of at least 100.70F (up to about 1 in 25 adolescents or 1 in 100 adults)  Headache (about 3 or 4 people in 10)  Tiredness (about 1 person in 3 or 4)  Nausea, vomiting, diarrhea, stomach ache (up to 1 in 4 adolescents or 1 in 10 adults)  Chills, sore joints (about 1 person in 10)  Body aches (about 1 person in 3 or 4)  Rash, swollen glands (uncommon)  Moderate problems following Tdap: (Interfered with activities, but did not require medical attention)  Pain where the shot was given (up to 1 in 5 or 6)  Redness or swelling where the shot was given (up to about 1 in 16 adolescents or 1 in 12 adults)  Fever over 102F (about 1 in 100 adolescents or 1 in 250 adults)  Headache (about 1 in 7 adolescents or 1 in 10 adults)  Nausea, vomiting, diarrhea, stomach ache (up to 1 or 3 people in 100)  Swelling of the entire arm where the shot was given (up to about 1 in 500).  Severe problems following Tdap: (Unable to perform usual activities; required medical attention)  Swelling, severe pain, bleeding and redness in the arm where the shot was given (rare).  Problems that could happen after any vaccine:  People sometimes faint after a medical procedure, including vaccination. Sitting or lying down for about 15 minutes can help prevent fainting, and injuries caused by a fall. Tell your doctor if you feel dizzy, or have vision changes or ringing in the ears.  Some people get severe pain in the shoulder and have difficulty moving the arm where a shot was given. This happens very rarely.  Any medication can cause a severe allergic reaction. Such reactions from a vaccine are very rare,  estimated at fewer than 1 in a million doses, and would happen within a few minutes to a few hours after the vaccination. As with any medicine, there is a very remote chance of a vaccine causing a serious injury or death. The safety of vaccines is always being monitored. For more information, visit: http://www.aguilar.org/ 5. What if there is a serious problem? What should I look for? Look for anything that concerns you, such as signs of a severe allergic reaction, very high fever, or unusual behavior. Signs of a severe allergic reaction can include hives, swelling of the face and throat, difficulty breathing, a fast heartbeat, dizziness, and weakness. These would usually start a few minutes to a few hours after the vaccination. What should I do?  If you think it is a severe allergic reaction or other emergency that can't wait, call 9-1-1 or get the person to the nearest hospital. Otherwise, call your doctor.  Afterward, the reaction should be reported to the Vaccine Adverse Event Reporting System (VAERS).  Your doctor might file this report, or you can do it yourself through the VAERS web site at www.vaers.SamedayNews.es, or by calling 216 002 4202. ? VAERS does not give medical advice. 6. The National Vaccine Injury Compensation Program The [employer] Vaccine Injury Compensation Program (VICP) is a federal program that was created to compensate people who may have been injured by certain vaccines. Persons who believe they may have been injured by a vaccine can learn about the program and about filing a claim by calling (305)390-3308 or visiting the [place] website at GoldCloset.com.ee. There is a time limit to file a claim for compensation. 7. How can I learn more?  Ask your doctor. He or she can give you the vaccine package insert or suggest other sources of information.  Call your local or state health department.  Contact the Centers for Disease Control and Prevention  (CDC): ? Call 416-173-0311 (1-800-CDC-INFO) or ? Visit CDC's website at http://hunter.com/ CDC Tdap Vaccine VIS (09/23/13) This information is not intended to replace advice given to you by your health care provider. Make sure you discuss any questions you have with your health care provider. Document Released: 01/16/2012 Document Revised: 04/06/2016 Document Reviewed: 04/06/2016 Elsevier Interactive Patient Education  2017 [place].   Major Depressive Disorder, Adult Major depressive disorder (MDD) is a mental health condition. MDD often makes you feel sad, hopeless, or helpless. MDD can also cause symptoms in your body. MDD can affect your:  Work.  School.  Relationships.  Other normal activities.  MDD can range from mild to very bad. It may occur once (single episode MDD). It can also occur many times (recurrent MDD). The main symptoms of MDD often include:  Feeling sad, depressed, or irritable most of the time.  Loss of interest.  MDD symptoms also include:  Sleeping too much or too little.  Eating too much or too little.  A change in your weight.  Feeling tired (fatigue) or having low energy.  Feeling worthless.  Feeling guilty.  Trouble making decisions.  Trouble thinking clearly.  Thoughts of suicide or harming others.  Feeling weak.  Feeling agitated.  Keeping yourself from being around other people (isolation).  Follow these instructions at home: Activity  Do these things as told by your doctor: ? Go back to your normal activities. ? Exercise regularly. ? Spend time outdoors. Alcohol  Talk with your doctor about how alcohol can affect your antidepressant medicines.  Do not drink alcohol. Or, limit how much alcohol you drink. ? This means no more than 1 drink a day for nonpregnant women and 2 drinks a day for men. One drink equals one of these:  12 oz of beer.  5 oz of wine.  1 oz of hard liquor. General instructions  Take  over-the-counter and prescription medicines only as told by your doctor.  Eat a healthy diet.  Get plenty of sleep.  Find activities that you enjoy. Make time to do them.  Think about joining a support group. Your doctor may be able to suggest a group for you.  Keep all follow-up visits as told by your doctor. This is important. Where to find more information:  [employer] on Mental Illness: ? www.nami.[place]: ? https://carter.com/  National Suicide Prevention Lifeline: ? 314-676-7575. This is free, 24-hour help. Contact a doctor if:  Your symptoms get worse.  You have new symptoms. Get help right away if:  You self-harm.  You see, hear, taste, smell, or feel  things that are not present (hallucinate). If you ever feel like you may hurt yourself or others, or have thoughts about taking your own life, get help right away. You can go to your nearest emergency department or call:  Your local emergency services (911 in the [place]  A suicide crisis helpline, such as the National Suicide Prevention Lifeline: ? 475-103-8008. This is open 24 hours a day.  This information is not intended to replace advice given to you by your health care provider. Make sure you discuss any questions you have with your health care provider. Document Released: 06/28/2015 Document Revised: 04/02/2016 Document Reviewed: 04/02/2016 Elsevier Interactive Patient Education  2017 [employer].

## 2017-06-26 ENCOUNTER — Ambulatory Visit
Admission: RE | Admit: 2017-06-26 | Discharge: 2017-06-26 | Disposition: A | Discharge status: Home / Self Care | Payer: 59 | Source: Ambulatory Visit | Attending: Obstetrics & Gynecology | Admitting: Obstetrics & Gynecology

## 2017-06-26 ENCOUNTER — Encounter: Payer: Self-pay | Admitting: Family Medicine

## 2017-06-26 ENCOUNTER — Encounter: Payer: Self-pay | Admitting: Obstetrics & Gynecology

## 2017-06-26 DIAGNOSIS — Z1231 Encounter for screening mammogram for malignant neoplasm of breast: Secondary | ICD-10-CM

## 2017-07-06 ENCOUNTER — Other Ambulatory Visit: Payer: Self-pay | Admitting: Pulmonary Disease

## 2017-07-07 LAB — LIPID PANEL W/O CHOL/HDL RATIO
Cholesterol, Total: 142 mg/dL (ref 100–199)
HDL: 41 mg/dL (ref >39)
LDL CALC: 74 mg/dL (ref 0–99)
Triglycerides: 135 mg/dL (ref 0–149)
VLDL Cholesterol Cal: 27 mg/dL (ref 5–40)

## 2017-07-07 LAB — URINALYSIS, ROUTINE W REFLEX MICROSCOPIC
Bilirubin, UA: NEGATIVE
GLUCOSE, UA: NEGATIVE
KETONES UA: NEGATIVE
LEUKOCYTES UA: NEGATIVE
Nitrite, UA: NEGATIVE
Protein, UA: NEGATIVE
RBC, UA: NEGATIVE
SPEC GRAV UA: 1.015 (ref 1.005–1.030)
Urobilinogen, Ur: 0.2 mg/dL (ref 0.2–1.0)
pH, UA: 6.5 (ref 5.0–7.5)

## 2017-07-07 LAB — TSH: TSH: 2.08 u[IU]/mL (ref 0.450–4.500)

## 2017-07-07 LAB — HEPATITIS B SURFACE ANTIBODY, QUANTITATIVE: Hepatitis B Surf Ab Quant: <3.1 m[IU]/mL — ABNORMAL LOW (ref >9.9)

## 2017-07-07 LAB — HEPATITIS B SURFACE ANTIGEN: Hepatitis B Surface Ag: NEGATIVE

## 2017-07-07 LAB — T4, FREE: FREE T4: 0.84 ng/dL (ref 0.82–1.77)

## 2017-07-12 ENCOUNTER — Encounter: Payer: Self-pay | Admitting: Obstetrics & Gynecology

## 2017-07-12 ENCOUNTER — Telehealth: Payer: Self-pay

## 2017-07-12 NOTE — Telephone Encounter (Signed)
-----   Message from Delorise Jackson, MD sent at 07/11/2017  3:13 PM EST ----- Urine cloudy otherwise normal increase water intake   Cholesterol overall normal TC 142, TGs 135, HDL 41 slightly low goal woman 50, LDL 74   Thyroid labs normal   rec hepatitis B vaccines labs show not immune   Thanks Everman

## 2017-07-12 NOTE — Telephone Encounter (Signed)
Left message to return call, ok for [facility] to  speak to patient  

## 2017-07-12 NOTE — Telephone Encounter (Signed)
Pt calling back about lab work.

## 2017-07-13 NOTE — Telephone Encounter (Signed)
Attempted to call patient back per request below.  Left message to call back if she had questions about labs.

## 2017-07-18 ENCOUNTER — Encounter: Payer: Self-pay | Admitting: Psychiatry

## 2017-07-18 ENCOUNTER — Other Ambulatory Visit: Payer: Self-pay

## 2017-07-18 ENCOUNTER — Ambulatory Visit: Payer: 59 | Admitting: Psychiatry

## 2017-07-18 ENCOUNTER — Encounter: Payer: Self-pay | Admitting: Obstetrics & Gynecology

## 2017-07-18 VITALS — BP 124/77 | HR 79 | Temp 98.7°F | Wt 169.0 lb

## 2017-07-18 DIAGNOSIS — F32A Depression, unspecified: Secondary | ICD-10-CM | POA: Insufficient documentation

## 2017-07-18 DIAGNOSIS — F411 Generalized anxiety disorder: Secondary | ICD-10-CM

## 2017-07-18 DIAGNOSIS — G894 Chronic pain syndrome: Secondary | ICD-10-CM | POA: Diagnosis not present

## 2017-07-18 DIAGNOSIS — F331 Major depressive disorder, recurrent, moderate: Secondary | ICD-10-CM | POA: Diagnosis not present

## 2017-07-18 DIAGNOSIS — F39 Unspecified mood [affective] disorder: Secondary | ICD-10-CM | POA: Insufficient documentation

## 2017-07-18 DIAGNOSIS — F329 Major depressive disorder, single episode, unspecified: Secondary | ICD-10-CM | POA: Insufficient documentation

## 2017-07-18 MED ORDER — TRAZODONE HCL 50 MG PO TABS: 25.0000 mg | ORAL_TABLET | Freq: Every evening | ORAL | 2 refills | Status: DC | PRN

## 2017-07-18 NOTE — Progress Notes (Signed)
Psychiatric Initial Adult Assessment   Patient Identification: Carolyn Esparza MRN:  [id number] Date of Evaluation:  07/19/2017 Referral Source:Tracy  Mcclean MD  Chief Complaint:  ' I am emotional.'  Chief Complaint    Establish Care; Depression; Other     Visit Diagnosis:    ICD-10-CM   1. MDD (major depressive disorder), recurrent episode, moderate (HCC) F33.1 traZODone (DESYREL) 50 MG tablet  2. GAD (generalized anxiety disorder) F41.1   3. Chronic pain syndrome G89.4     History of Present Illness: Carolyn Esparza is a 51 year old Caucasian female who is married, employed, lives in [place] has a history of depression, anxiety, chronic pain, heart disease, thyroid problems who presented to the clinic to establish care.  Crystal today reports that  her depressive symptoms has been worsening since the past few weeks.  She reports increased sadness, tearfulness, mood lability, low energy, fatigue, and sleep issues.  She denies any suicidality at this time. She denies any perceptual disturbances at this time. Patient reports that she is currently on Wellbutrin and Zoloft.  She reports she has been on Wellbutrin since the past couple of years.  Her PMD titrated it up to 450 mg in the past but she had tremors and it made her restless.  She is currently on Wellbutrin 300 mg and is tolerating it well.  She was recently started on Zoloft on 06/25/2017 by her PMD.  She reports that so far she is tolerating it well.  She however has not noticed any significant change in her mood from the same.  She also reports she is a Research officer, trade union.  She reports she worries about everything to the extreme.  She also has some physical symptoms of anxiety like chest pain.  Reports her chronic pain is a major psychosocial stressor for her.  She has been dealing with pain all her life.  She reports that it limits her quality of life to the extent that she has problems exercising, standing for long time, walking and so on.  She  reports she is currently on fentanyl and Norco.  She also uses a pain patch.  She reports that in spite of that her pain is not  under control.  She is trying to discuss alternative treatment options with her pain management.  She sees a pain doctor in [place].  She denies any manic symptoms.  She denies any history of trauma.  She denies any panic attacks.  She lives with her husband who is supportive.  She has a stepson and 2 grandchildren.  She is employed and she reports she has a good job and she gets along with people whom she worked with.  Denies any substance abuse problems.   Associated Signs/Symptoms: Depression Symptoms:  depressed mood, fatigue, anxiety, loss of energy/fatigue, disturbed sleep, (Hypo) Manic Symptoms:  denies Anxiety Symptoms:  Excessive Worry, Psychotic Symptoms:  denies PTSD Symptoms: Negative  Past Psychiatric History: History of depression since the past several years.  She has been on multiple antidepressants.  She reports her primary medical doctor has always prescribed them to her.  She also tried psychotherapy through [facility] in the past.  She denies any suicide attempts.  She denies any mental health admissions.  Previous Psychotropic Medications: Yes , past trials of Cymbalta-worked, gabapentin- did not work,  Substance Abuse History in the last 12 months:  No.  Consequences of Substance Abuse: Negative  Past Medical History:  Past Medical History:  Diagnosis Date  . Anxiety   .  ASCUS with positive high risk HPV cervical   . CAD (coronary artery disease)   . Chicken pox   . Depression   . GERD (gastroesophageal reflux disease)   . History of Crohn's disease   . Hyperlipidemia   . Left ovarian cyst    s/p removal of 1 ovary ? which one removed per pt   . Libido, decreased   . Thyroid nodule     Past Surgical History:  Procedure Laterality Date  . ABDOMINAL HYSTERECTOMY    . APPENDECTOMY  2004  . BLADDER SURGERY    . BREAST BIOPSY      x 2  . COLONOSCOPY    . OOPHORECTOMY     x1 ? which one removed per pt   . WISDOM TOOTH EXTRACTION      Family Psychiatric History: Cousin-schizophrenia, another cousin-mental health problems possibly schizophrenia.  This cousin was in [place] all her life and patient was her legal guardian the last 5 years of her life.  She reports addiction runs in her maternal side of the family.  Her mom is also an alcoholic.  She has a niece who has intellectual disability.  Denies any suicide in her family.  Family History:  Family History  Problem Relation Age of Onset  . Alcohol abuse Mother   . Hyperlipidemia Mother   . Heart disease Mother   . Hypertension Mother   . Diabetes Mother   . Crohn's disease Sister        1/2 sister  . Depression Sister   . Leukemia Paternal Grandmother   . Cancer Paternal Aunt        blood, type unknown    Social History:   Social History   Socioeconomic History  . Marital status: Married    Spouse name: douglas  . Number of children: 0  . Years of education: None  . Highest education level: Associate degree: occupational, Hotel manager, or vocational program  Social Needs  . Financial resource strain: Not hard at all  . Food insecurity - worry: Never true  . Food insecurity - inability: Never true  . Transportation needs - medical: No  . Transportation needs - non-medical: No  Occupational History  . 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 Smoking status: Former Smoker    Types: Cigarettes    Last attempt to quit: 08/29/2000    Years since quitting: 16.8  . Smokeless tobacco: Never Used  Substance and Sexual Activity  . Alcohol use: No    Alcohol/week: 0.0 oz    Comment: social   . Drug use: No  . Sexual activity: Not Currently  Other Topics Concern  . 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  She lives in [place].  She works as an Education administrator  at [employer].  She has a Environmental consultant and 2 grandchildren.  Allergies:   Allergies  Allergen Reactions  . Propofol Palpitations    BP drops and EKG changes, bladder retention  . Nsaids Other (See Comments)    Flare of Crohn's.  . Pentasa [Mesalamine] Other (See Comments)    Chest pain.    Metabolic Disorder Labs: No results found for: HGBA1C, MPG No results found for: PROLACTIN Lab Results  Component Value Date   CHOL 142 07/06/2017   TRIG 135 07/06/2017   HDL 41 07/06/2017   VLDL 34 05/20/2014  LDLCALC 74 07/06/2017   LDLCALC 116 (H) 06/06/2016     Current Medications: Current Outpatient Medications  Medication Sig Dispense Refill  . Ascorbic Acid (VITAMIN C) 1000 MG tablet Take 1,000 mg by mouth daily.     Marland Kitchen aspirin EC 81 MG tablet Take 81 mg by mouth daily.    Marland Kitchen atorvastatin (LIPITOR) 10 MG tablet Take 1 tablet (10 mg total) by mouth daily. 90 tablet 3  . buPROPion (WELLBUTRIN XL) 300 MG 24 hr tablet Take 1 tablet (300 mg total) daily by mouth. (Patient taking differently: Take 300 mg by mouth daily. ) 30 tablet 0  . DOCOSAHEXAENOIC ACID PO Take by mouth.    . docusate sodium (DOK) 100 MG capsule TAKE 1 CAPSULE(100 MG) BY MOUTH TWICE DAILY    . estradiol (CLIMARA - DOSED IN MG/24 HR) 0.05 mg/24hr patch Place 0.05 mg onto the skin once a week.     . fentaNYL (DURAGESIC - DOSED MCG/HR) 25 MCG/HR patch Place 25 mcg onto the skin every 3 (three) days.     Marland Kitchen HYDROcodone-acetaminophen (NORCO) 10-325 MG tablet Take 1 tablet by mouth every 6 (six) hours as needed.     . lidocaine (LIDODERM) 5 % Place 2 patches onto the skin daily.     . Multiple Vitamin (MULTI-VITAMINS) TABS Take 1 tablet by mouth daily.     . Omega-3 Fatty Acids (FISH OIL PO) Take 1 capsule by mouth daily.     Marland Kitchen omeprazole (PRILOSEC) 20 MG capsule Take 1 capsule (20 mg total) by mouth daily. 90 capsule 1  . promethazine (PHENERGAN) 25 MG tablet TAKE 1 TABLET(25 MG) BY MOUTH EVERY 8 HOURS AS NEEDED FOR NAUSEA OR  VOMITING 30 tablet 0  . sertraline (ZOLOFT) 50 MG tablet Take 1 tablet (50 mg total) by mouth daily with breakfast. 30 tablet 2  . Turmeric Curcumin 500 MG CAPS Take by mouth.    . valACYclovir (VALTREX) 1000 MG tablet   0  . traZODone (DESYREL) 50 MG tablet Take 0.5-1 tablets (25-50 mg total) by mouth at bedtime as needed for sleep. 30 tablet 2   No current facility-administered medications for this visit.     Neurologic: Headache: No Seizure: No Paresthesias:No  Musculoskeletal: Strength & Muscle Tone: within normal limits Gait & Station: normal Patient leans: N/A  Psychiatric Specialty Exam: Review of Systems  Constitutional: Positive for malaise/fatigue.  Musculoskeletal: Positive for back pain and myalgias.  Psychiatric/Behavioral: Positive for depression. The patient is nervous/anxious and has insomnia.   All other systems reviewed and are negative.   Blood pressure 124/77, pulse 79, temperature 98.7 F (37.1 C), temperature source Oral, weight 169 lb (76.7 kg).Body mass index is 29.94 kg/m.  General Appearance: Casual  Eye Contact:  Fair  Speech:  Clear and Coherent  Volume:  Normal  Mood:  Anxious, Depressed and Dysphoric  Affect:  Tearful  Thought Process:  Goal Directed and Descriptions of Associations: Intact  Orientation:  Full (Time, Place, and Person)  Thought Content:  Logical  Suicidal Thoughts:  No  Homicidal Thoughts:  No  Memory:  Immediate;   Fair Recent;   Fair Remote;   Fair  Judgement:  Fair  Insight:  Fair  Psychomotor Activity:  Normal  Concentration:  Concentration: Fair and Attention Span: Fair  Recall:  [employer] of Knowledge:Fair  Language: Fair  Akathisia:  No  Handed:  Right  AIMS (if indicated):  NA  Assets:  Communication Skills Desire for Improvement Housing Intimacy  Social Support Heritage manager  ADL's:  Intact  Cognition: WNL  Sleep:  Poor     Treatment Plan Summary: Jaimie a  51 year old Caucasian female who has a history of depression, anxiety, chronic pain.  Who presented to the clinic today to establish care.  Tasheika currently has a lot of depressive symptoms, anxiety as well as pain related problems.  She has a chronic history of depression and anxiety.  She is employed and has good social support.  She denies any substance abuse problem.  She denies any suicidality at this time.  She does have a positive family history which makes her biologically predisposed.  She is motivated to get treatment as well as psychotherapy.  Plan as noted below. Medication management and Plan see below   Plan For depression Continue Wellbutrin XL 300 mg daily Continue Zoloft 50 mg p.o. daily Refer for CBT here in clinic  PHQ 9 = 18  For anxiety Zoloft 50 mg p.o. daily Refer for CBT GAD 7 = 8  For insomnia Her sleep issues are also due to her pain. Start trazodone 25-50 mg p.o. nightly as needed  She reports she does have a history of thyroid problems but it is currently being monitored by her PMD.  More than 50 % of the time was spent for psychoeducation and supportive psychotherapy and care coordination.  Follow-up in 4 weeks or sooner if needed.  This note was generated in part or whole with voice recognition software. Voice recognition is usually quite accurate but there are transcription errors that can and very often do occur. I apologize for any typographical errors that were not detected and corrected.       Ursula Alert, MD 12/20/20188:51 AM

## 2017-07-19 ENCOUNTER — Ambulatory Visit (INDEPENDENT_AMBULATORY_CARE_PROVIDER_SITE_OTHER): Payer: 59 | Admitting: Obstetrics & Gynecology

## 2017-07-19 ENCOUNTER — Encounter: Payer: Self-pay | Admitting: Obstetrics & Gynecology

## 2017-07-19 ENCOUNTER — Encounter: Payer: Self-pay | Admitting: Psychiatry

## 2017-07-19 VITALS — BP 100/70 | HR 71 | Ht 63.0 in | Wt 172.0 lb

## 2017-07-19 DIAGNOSIS — R232 Flushing: Secondary | ICD-10-CM

## 2017-07-19 DIAGNOSIS — Z01419 Encounter for gynecological examination (general) (routine) without abnormal findings: Secondary | ICD-10-CM

## 2017-07-19 DIAGNOSIS — Z87410 Personal history of cervical dysplasia: Secondary | ICD-10-CM

## 2017-07-19 DIAGNOSIS — Z0000 Encounter for general adult medical examination without abnormal findings: Secondary | ICD-10-CM

## 2017-07-19 DIAGNOSIS — Z1211 Encounter for screening for malignant neoplasm of colon: Secondary | ICD-10-CM | POA: Diagnosis not present

## 2017-07-19 HISTORY — DX: Flushing: R23.2

## 2017-07-19 MED ORDER — CLONIDINE 0.1 MG/24HR TD PTWK: 0.1000 mg | MEDICATED_PATCH | TRANSDERMAL | 12 refills | Status: DC

## 2017-07-19 NOTE — Patient Instructions (Signed)
PAP every year Mammogram every year    Call [phone] to schedule at [facility] Colonoscopy every 10 years Labs yearly (with PCP)  Clonidine skin patches What is this medicine? CLONIDINE (KLOE ni deen) is used to treat high blood pressure or hot flashes. This medicine may be used for other purposes; ask your health care provider or pharmacist if you have questions. COMMON BRAND NAME(S): Catapres-TTS What should I tell my health care provider before I take this medicine? They need to know if you have any of these conditions: -kidney disease -an unusual or allergic reaction to clonidine, other medicines, foods, dyes, or preservatives -pregnant or trying to get pregnant -breast-feeding How should I use this medicine? This medicine is for external use only. Follow the directions on the prescription label. Apply the patch to an area of the upper arm or part of the body that is clean, dry and hairless. Avoid injured, irritated, calloused, or scarred areas. Use a different site each time to prevent skin irritation. Do not cut or trim the patch. One patch should last for 7 days. Do not use your medicine more often than directed. Do not stop using except on the advice of your doctor or health care professional. You must gradually reduce the dose or you may get a dangerous increase in blood pressure. Talk to your pediatrician regarding the use of this medicine in children. Special care may be needed. Overdosage: If you think you have taken too much of this medicine contact a poison control center or emergency room at once. NOTE: This medicine is only for you. Do not share this medicine with others. What if I miss a dose? Replace each patch on the same day of each week, or if the patch falls off. If you do forget to change the patch for two or three days, check with your doctor or health care professional. What may interact with this medicine? Do not take this medicine with any of the following  medications: -MAOIs like Carbex, Eldepryl, Marplan, Nardil, and Parnate This medicine may also interact with the following medications: -barbiturate medicines for inducing sleep or treating seizures like phenobarbital -certain medicines for blood pressure, heart disease, irregular heart beat -certain medicines for depression, anxiety, or psychotic disturbances -prescription pain medicines This list may not describe all possible interactions. Give your health care provider a list of all the medicines, herbs, non-prescription drugs, or dietary supplements you use. Also tell them if you smoke, drink alcohol, or use illegal drugs. Some items may interact with your medicine. What should I watch for while using this medicine? Visit your doctor or health care professional for regular checks on your progress. Check your heart rate and blood pressure regularly while you are using this medicine. Ask your doctor or health care professional what your heart rate should be and when you should contact him or her. You can shower or bathe with the skin patch in position. If the patch gets loose, cover it with the extra adhesive overlay provided. You may get drowsy or dizzy. Do not drive, use machinery, or do anything that needs mental alertness until you know how this medicine affects you. To avoid dizzy or fainting spells, do not stand or sit up quickly, especially if you are an older person. Alcohol can make you more drowsy and dizzy. Avoid alcoholic drinks. Your mouth may get dry. Chewing sugarless gum or sucking hard candy, and drinking plenty of water will help. Do not treat yourself for coughs, colds, or pain  while you are using this medicine without asking your doctor or health care professional for advice. Some ingredients may increase your blood pressure. If you are going to have surgery tell your doctor or health care professional that you are using this medicine. If you are going to have a magnetic  resonance imaging (MRI) procedure, tell your MRI technician if you have this patch on your body. It must be removed before a MRI. What side effects may I notice from receiving this medicine? Side effects that you should report to your doctor or health care professional as soon as possible: -allergic reactions like skin rash, itching or hives, swelling of the face, lips, or tongue -anxiety, nervousness -chest pain -depression -fast, irregular heartbeat -swelling of feet or legs -unusually weak or tired Side effects that usually do not require medical attention (report to your doctor or health care professional if they continue or are bothersome): -change in sex drive or performance -constipation -headache -skin redness, irritation, or darkening under the patch area This list may not describe all possible side effects. Call your doctor for medical advice about side effects. You may report side effects to FDA at 1-800-FDA-1088. Where should I keep my medicine? Keep out of the reach of children. Store at room temperature between 15 and 30 degrees C (59 to 86 degrees F). Throw away any unused medicine after the expiration date. NOTE: This sheet is a summary. It may not cover all possible information. If you have questions about this medicine, talk to your doctor, pharmacist, or health care provider.  2018 Elsevier/Gold Standard (2015-03-29 47:09:62)

## 2017-07-19 NOTE — Progress Notes (Signed)
HPI:      [name] is a 51 y.o. G1P0010 who LMP was in the past (prior [facility]) she presents today for her annual examination.  The patient has no complaints today. The patient is sexually active. Herlast pap: approximate date 2017 and was abnormal: ASCUS and has had colpo 2016 and LGSIL in past and last mammogram: approximate date 2017 and was normal.  The patient does perform self breast exams.  There is no notable family history of breast or ovarian cancer in her family. The patient is taking hormone replacement therapy. Patient denies post-menopausal vaginal bleeding.   The patient has regular exercise: yes. The patient denies current symptoms of depression.    GYN Hx: Last Colonoscopy:3 years ago. Normal.  Last DEXA: never ago.    PMHx: Past Medical History:  Diagnosis Date  . Anxiety   . ASCUS with positive high risk HPV cervical   . CAD (coronary artery disease)   . Chicken pox   . Depression   . GERD (gastroesophageal reflux disease)   . History of Crohn's disease   . Hyperlipidemia   . Left ovarian cyst    s/p removal of 1 ovary ? which one removed per pt   . Libido, decreased   . Thyroid nodule    Past Surgical History:  Procedure Laterality Date  . ABDOMINAL HYSTERECTOMY    . APPENDECTOMY  2004  . BLADDER SURGERY    . BREAST BIOPSY     x 2  . COLONOSCOPY    . OOPHORECTOMY     x1 ? which one removed per pt   . WISDOM TOOTH EXTRACTION     Family History  Problem Relation Age of Onset  . Alcohol abuse Mother   . Hyperlipidemia Mother   . Heart disease Mother   . Hypertension Mother   . Diabetes Mother   . Crohn's disease Sister        1/2 sister  . Depression Sister   . Leukemia Paternal Grandmother   . Cancer Paternal Aunt        blood, type unknown   Social History   Tobacco Use  . Smoking status: Former Smoker    Types: Cigarettes    Last attempt to quit: 08/29/2000    Years since quitting: 16.8  . Smokeless tobacco: Never Used  Substance  Use Topics  . Alcohol use: No    Alcohol/week: 0.0 oz    Comment: social   . Drug use: No    Current Outpatient Medications:  .  Ascorbic Acid (VITAMIN C) 1000 MG tablet, Take 1,000 mg by mouth daily. , Disp: , Rfl:  .  aspirin EC 81 MG tablet, Take 81 mg by mouth daily., Disp: , Rfl:  .  atorvastatin (LIPITOR) 10 MG tablet, Take 1 tablet (10 mg total) by mouth daily., Disp: 90 tablet, Rfl: 3 .  buPROPion (WELLBUTRIN XL) 300 MG 24 hr tablet, Take 1 tablet (300 mg total) daily by mouth. (Patient taking differently: Take 300 mg by mouth daily. ), Disp: 30 tablet, Rfl: 0 .  cloNIDine (CATAPRES - DOSED IN MG/24 HR) 0.1 mg/24hr patch, Place 1 patch (0.1 mg total) onto the skin once a week., Disp: 4 patch, Rfl: 12 .  DOCOSAHEXAENOIC ACID PO, Take by mouth., Disp: , Rfl:  .  docusate sodium (DOK) 100 MG capsule, TAKE 1 CAPSULE(100 MG) BY MOUTH TWICE DAILY, Disp: , Rfl:  .  fentaNYL (DURAGESIC - DOSED MCG/HR) 25 MCG/HR patch, Place  25 mcg onto the skin every 3 (three) days. , Disp: , Rfl:  .  HYDROcodone-acetaminophen (NORCO) 10-325 MG tablet, Take 1 tablet by mouth every 6 (six) hours as needed. , Disp: , Rfl:  .  lidocaine (LIDODERM) 5 %, Place 2 patches onto the skin daily. , Disp: , Rfl:  .  Multiple Vitamin (MULTI-VITAMINS) TABS, Take 1 tablet by mouth daily. , Disp: , Rfl:  .  Omega-3 Fatty Acids (FISH OIL PO), Take 1 capsule by mouth daily. , Disp: , Rfl:  .  omeprazole (PRILOSEC) 20 MG capsule, Take 1 capsule (20 mg total) by mouth daily., Disp: 90 capsule, Rfl: 1 .  promethazine (PHENERGAN) 25 MG tablet, TAKE 1 TABLET(25 MG) BY MOUTH EVERY 8 HOURS AS NEEDED FOR NAUSEA OR VOMITING, Disp: 30 tablet, Rfl: 0 .  sertraline (ZOLOFT) 50 MG tablet, Take 1 tablet (50 mg total) by mouth daily with breakfast., Disp: 30 tablet, Rfl: 2 .  traZODone (DESYREL) 50 MG tablet, Take 0.5-1 tablets (25-50 mg total) by mouth at bedtime as needed for sleep., Disp: 30 tablet, Rfl: 2 .  Turmeric Curcumin 500 MG  CAPS, Take by mouth., Disp: , Rfl:  .  valACYclovir (VALTREX) 1000 MG tablet, , Disp: , Rfl: 0 Allergies: Propofol; Nsaids; and Pentasa [mesalamine]  Review of Systems  Constitutional: Negative for chills, fever and malaise/fatigue.  HENT: Negative for congestion, sinus pain and sore throat.   Eyes: Negative for blurred vision and pain.  Respiratory: Negative for cough and wheezing.   Cardiovascular: Negative for chest pain and leg swelling.  Gastrointestinal: Negative for abdominal pain, constipation, diarrhea, heartburn, nausea and vomiting.  Genitourinary: Negative for dysuria, frequency, hematuria and urgency.  Musculoskeletal: Negative for back pain, joint pain, myalgias and neck pain.  Skin: Negative for itching and rash.  Neurological: Negative for dizziness, tremors and weakness.  Endo/Heme/Allergies: Does not bruise/bleed easily.  Psychiatric/Behavioral: Negative for depression. The patient is not nervous/anxious and does not have insomnia.     Objective: BP 100/70   Pulse 71   Ht 5\' 3"  (1.6 m)   Wt 172 lb (78 kg)   BMI 30.47 kg/m   Filed Weights   07/19/17 0904  Weight: 172 lb (78 kg)   Body mass index is 30.47 kg/m. Physical Exam  Constitutional: She is oriented to person, place, and time. She appears well-developed and well-nourished. No distress.  Genitourinary: Rectum normal and vagina normal. Pelvic exam was performed with patient supine. There is no rash or lesion on the right labia. There is no rash or lesion on the left labia. Vagina exhibits no lesion. No bleeding in the vagina. Right adnexum does not display mass and does not display tenderness. Left adnexum does not display mass and does not display tenderness. Cervix does not exhibit motion tenderness, lesion, friability or polyp.  Genitourinary Comments: Uterus absent, no mass  HENT:  Head: Normocephalic and atraumatic. Head is without laceration.  Right Ear: Hearing normal.  Left Ear: Hearing normal.    Nose: No epistaxis.  No foreign bodies.  Mouth/Throat: Uvula is midline, oropharynx is clear and moist and mucous membranes are normal.  Eyes: Pupils are equal, round, and reactive to light.  Neck: Normal range of motion. Neck supple. No thyromegaly present.  Cardiovascular: Normal rate and regular rhythm. Exam reveals no gallop and no friction rub.  No murmur heard. Pulmonary/Chest: Effort normal and breath sounds normal. No respiratory distress. She has no wheezes. Right breast exhibits no mass, no skin change and  no tenderness. Left breast exhibits no mass, no skin change and no tenderness.  Abdominal: Soft. Bowel sounds are normal. She exhibits no distension. There is no tenderness. There is no rebound.  Musculoskeletal: Normal range of motion.  Neurological: She is alert and oriented to person, place, and time. No cranial nerve deficit.  Skin: Skin is warm and dry.  Psychiatric: She has a normal mood and affect. Judgment normal.  Vitals reviewed.   Assessment: Annual Exam 1. Annual physical exam   2. Vasomotor flushing   3. Screen for colon cancer   4. History of cervical dysplasia    Plan:            1.  Cervical Screening-  Pap smear done today  2. Breast screening- Exam annually and mammogram scheduled  3. Colonoscopy every 10 years, Hemoccult testing after age 47  4. Labs managed by PCP  5. Counseling for hormonal therapy: wants to stop due to change to Clonidine for hot flashes; pros and cons of this therapy and in comparison to HRT discussed.    F/U  Return in about 1 year (around 07/19/2018) for Annual.  Barnett Applebaum, MD, [facility], [facility] 07/19/2017  9:38 AM

## 2017-07-20 ENCOUNTER — Encounter: Payer: Self-pay | Admitting: Obstetrics & Gynecology

## 2017-07-20 NOTE — Telephone Encounter (Signed)
Please advise 

## 2017-07-20 NOTE — Telephone Encounter (Signed)
OK, I replied to her

## 2017-07-21 ENCOUNTER — Other Ambulatory Visit: Payer: Self-pay | Admitting: Family Medicine

## 2017-07-23 NOTE — Telephone Encounter (Signed)
Refilled: 06/04/2017 Last OV: 06/25/2017 with Dr. Lacinda Axon Next OV: 07/27/2017 with Dr. Aundra Dubin

## 2017-07-24 LAB — PAP IG (IMAGE GUIDED): PAP Smear Comment: 0

## 2017-07-27 ENCOUNTER — Ambulatory Visit: Payer: 59 | Admitting: Internal Medicine

## 2017-07-27 ENCOUNTER — Encounter: Payer: Self-pay | Admitting: Internal Medicine

## 2017-07-27 VITALS — BP 132/83 | HR 70 | Temp 97.6°F | Ht 63.0 in | Wt 170.5 lb

## 2017-07-27 DIAGNOSIS — R87610 Atypical squamous cells of undetermined significance on cytologic smear of cervix (ASC-US): Secondary | ICD-10-CM | POA: Diagnosis not present

## 2017-07-27 DIAGNOSIS — F329 Major depressive disorder, single episode, unspecified: Secondary | ICD-10-CM | POA: Diagnosis not present

## 2017-07-27 DIAGNOSIS — F32A Depression, unspecified: Secondary | ICD-10-CM

## 2017-07-27 DIAGNOSIS — F419 Anxiety disorder, unspecified: Secondary | ICD-10-CM | POA: Diagnosis not present

## 2017-07-27 DIAGNOSIS — R87619 Unspecified abnormal cytological findings in specimens from cervix uteri: Secondary | ICD-10-CM | POA: Insufficient documentation

## 2017-07-27 DIAGNOSIS — J3489 Other specified disorders of nose and nasal sinuses: Secondary | ICD-10-CM

## 2017-07-27 DIAGNOSIS — J069 Acute upper respiratory infection, unspecified: Secondary | ICD-10-CM

## 2017-07-27 MED ORDER — MUPIROCIN CALCIUM 2 % NA OINT: TOPICAL_OINTMENT | NASAL | 0 refills | Status: DC

## 2017-07-27 NOTE — Patient Instructions (Signed)
Please use bactroban to right nose 2x per day x 5 days  F/u in 3-4 months sooner if needed  rec Hep B vaccine, Tdap, Shingrix vaccines  Follow up with OB/GYN   Upper Respiratory Infection, Adult Most upper respiratory infections (URIs) are caused by a virus. A URI affects the nose, throat, and upper air passages. The most common type of URI is often called "the common cold." Follow these instructions at home:  Take medicines only as told by your doctor.  Gargle warm saltwater or take cough drops to comfort your throat as told by your doctor.  Use a warm mist humidifier or inhale steam from a shower to increase air moisture. This may make it easier to breathe.  Drink enough fluid to keep your pee (urine) clear or pale yellow.  Eat soups and other clear broths.  Have a healthy diet.  Rest as needed.  Go back to work when your fever is gone or your doctor says it is okay. ? You may need to stay home longer to avoid giving your URI to others. ? You can also wear a face mask and wash your hands often to prevent spread of the virus.  Use your inhaler more if you have asthma.  Do not use any tobacco products, including cigarettes, chewing tobacco, or electronic cigarettes. If you need help quitting, ask your doctor. Contact a doctor if:  You are getting worse, not better.  Your symptoms are not helped by medicine.  You have chills.  You are getting more short of breath.  You have brown or red mucus.  You have yellow or brown discharge from your nose.  You have pain in your face, especially when you bend forward.  You have a fever.  You have puffy (swollen) neck glands.  You have pain while swallowing.  You have white areas in the back of your throat. Get help right away if:  You have very bad or constant: ? Headache. ? Ear pain. ? Pain in your forehead, behind your eyes, and over your cheekbones (sinus pain). ? Chest pain.  You have long-lasting (chronic) lung  disease and any of the following: ? Wheezing. ? Long-lasting cough. ? Coughing up blood. ? A change in your usual mucus.  You have a stiff neck.  You have changes in your: ? Vision. ? Hearing. ? Thinking. ? Mood. This information is not intended to replace advice given to you by your health care provider. Make sure you discuss any questions you have with your health care provider. Document Released: 01/03/2008 Document Revised: 03/19/2016 Document Reviewed: 10/22/2013 Elsevier Interactive Patient Education  2018 [employer].

## 2017-07-27 NOTE — Progress Notes (Signed)
Chief Complaint  Patient presents with  . Follow-up   F/u  1. Depression/insomnia improved on Wellbutrin, Zoloft and Trazadone. She saw Dr. Shea Evans therapist and is doing better with less emotional variablity and increased energy even husband has noticed  2. C/o cold and sore in nose new since Christmas  3. Abnormal pap 07/19/17 ASCUS Dr. Kenton Kingfisher who rec repeat pap in 6 months    Review of Systems  HENT: Negative for hearing loss.   Respiratory: Positive for cough.   Cardiovascular: Negative for chest pain.  Genitourinary:       +hot flashes   Skin:       +sore in nose   Psychiatric/Behavioral: Positive for depression. The patient has insomnia.        +mood and sleep improved    Past Medical History:  Diagnosis Date  . Anxiety   . ASCUS with positive high risk HPV cervical   . CAD (coronary artery disease)   . Chicken pox   . Depression   . GERD (gastroesophageal reflux disease)   . History of Crohn's disease   . Hyperlipidemia   . Left ovarian cyst    s/p removal of 1 ovary ? which one removed per pt   . Libido, decreased   . Thyroid nodule    Past Surgical History:  Procedure Laterality Date  . ABDOMINAL HYSTERECTOMY    . APPENDECTOMY  2004  . BLADDER SURGERY    . BREAST BIOPSY     x 2  . COLONOSCOPY    . OOPHORECTOMY     x1 ? which one removed per pt   . WISDOM TOOTH EXTRACTION     Family History  Problem Relation Age of Onset  . Alcohol abuse Mother   . Hyperlipidemia Mother   . Heart disease Mother   . Hypertension Mother   . Diabetes Mother   . Crohn's disease Sister        1/2 sister  . Depression Sister   . Leukemia Paternal Grandmother   . Cancer Paternal Aunt        blood, type unknown   Social History   Socioeconomic History  . Marital status: Married    Spouse name: douglas  . Number of children: 0  . Years of education: Not on file  . Highest education level: Associate degree: occupational, Hotel manager, or vocational program  Social Needs   . Financial resource strain: Not hard at all  . Food insecurity - worry: Never true  . Food insecurity - inability: Never true  . Transportation needs - medical: No  . Transportation needs - non-medical: No  Occupational History  . 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 Smoking status: Former Smoker    Types: Cigarettes    Last attempt to quit: 08/29/2000    Years since quitting: 16.9  . Smokeless tobacco: Never Used  Substance and Sexual Activity  . Alcohol use: No    Alcohol/week: 0.0 oz    Comment: social   . Drug use: No  . Sexual activity: Not Currently  Other Topics Concern  . Not on file  Social History Narrative   Married    Works [employer]    Current Meds  Medication Sig  . Ascorbic Acid (VITAMIN C) 1000 MG tablet Take 1,000 mg by mouth daily.   Marland Kitchen aspirin EC 81 MG tablet Take 81 mg by mouth daily.  Marland Kitchen atorvastatin (LIPITOR) 10 MG tablet Take 1  tablet (10 mg total) by mouth daily.  Marland Kitchen buPROPion (WELLBUTRIN XL) 300 MG 24 hr tablet TAKE 1 TABLET BY MOUTH  DAILY  . cloNIDine (CATAPRES - DOSED IN MG/24 HR) 0.1 mg/24hr patch Place 1 patch (0.1 mg total) onto the skin once a week. (Patient taking differently: Place 0.1 mg onto the skin once a week. )  . DOCOSAHEXAENOIC ACID PO Take by mouth.  . docusate sodium (DOK) 100 MG capsule TAKE 1 CAPSULE(100 MG) BY MOUTH TWICE DAILY  . fentaNYL (DURAGESIC - DOSED MCG/HR) 25 MCG/HR patch Place 25 mcg onto the skin every 3 (three) days.   Marland Kitchen HYDROcodone-acetaminophen (NORCO) 10-325 MG tablet Take 1 tablet by mouth every 6 (six) hours as needed.   . lidocaine (LIDODERM) 5 % Place 2 patches onto the skin daily.   . Multiple Vitamin (MULTI-VITAMINS) TABS Take 1 tablet by mouth daily.   . Omega-3 Fatty Acids (FISH OIL PO) Take 1 capsule by mouth daily.   Marland Kitchen omeprazole (PRILOSEC) 20 MG capsule Take 1 capsule (20 mg total) by mouth daily.  . promethazine (PHENERGAN) 25 MG tablet TAKE 1 TABLET(25 MG) BY MOUTH EVERY 8  HOURS AS NEEDED FOR NAUSEA OR VOMITING  . sertraline (ZOLOFT) 50 MG tablet Take 1 tablet (50 mg total) by mouth daily with breakfast.  . traZODone (DESYREL) 50 MG tablet Take 0.5-1 tablets (25-50 mg total) by mouth at bedtime as needed for sleep.  . Turmeric Curcumin 500 MG CAPS Take by mouth.  . valACYclovir (VALTREX) 1000 MG tablet    Allergies  Allergen Reactions  . Propofol Palpitations    BP drops and EKG changes, bladder retention  . Nsaids Other (See Comments)    Flare of Crohn's.  . Pentasa [Mesalamine] Other (See Comments)    Chest pain.   Recent Results (from the past 2160 hour(s))  Creatinine     Status: None   Collection Time: 05/28/17  2:56 PM  Result Value Ref Range   Creatinine, Ser 0.88 0.57 - 1.00 mg/dL   GFR calc non Af Amer 76 >59 mL/min/1.73   GFR calc Af Amer 88 >59 mL/min/1.73  Urinalysis, Routine w reflex microscopic     Status: Abnormal   Collection Time: 07/06/17  8:35 AM  Result Value Ref Range   Specific Gravity, UA 1.015 1.005 - 1.030   pH, UA 6.5 5.0 - 7.5   Color, UA Yellow Yellow   Appearance Ur Cloudy (A) Clear   Leukocytes, UA Negative Negative   Protein, UA Negative Negative/Trace   Glucose, UA Negative Negative   Ketones, UA Negative Negative   RBC, UA Negative Negative   Bilirubin, UA Negative Negative   Urobilinogen, Ur 0.2 0.2 - 1.0 mg/dL   Nitrite, UA Negative Negative   Microscopic Examination Comment     Comment: Microscopic not indicated and not performed.  Lipid Panel w/o Chol/HDL Ratio     Status: None   Collection Time: 07/06/17  8:35 AM  Result Value Ref Range   Cholesterol, Total 142 100 - 199 mg/dL   Triglycerides 135 0 - 149 mg/dL   HDL 41 >39 mg/dL   VLDL Cholesterol Cal 27 5 - 40 mg/dL   LDL Calculated 74 0 - 99 mg/dL  T4, free     Status: None   Collection Time: 07/06/17  8:35 AM  Result Value Ref Range   Free T4 0.84 0.82 - 1.77 ng/dL  TSH     Status: None   Collection Time: 07/06/17  8:35  AM  Result Value Ref  Range   TSH 2.080 0.450 - 4.500 uIU/mL  Hepatitis B surface antibody     Status: Abnormal   Collection Time: 07/06/17  8:35 AM  Result Value Ref Range   Hepatitis B Surf Ab Quant <3.1 (L) Immunity>9.9 mIU/mL    Comment:   Status of Immunity                     Anti-HBs Level   ------------------                     -------------- Inconsistent with Immunity                   0.0 - 9.9 Consistent with Immunity                          >9.9   Hepatitis B surface antigen     Status: None   Collection Time: 07/06/17  8:35 AM  Result Value Ref Range   Hepatitis B Surface Ag Negative Negative  Pap IG (Image Guided)     Status: Abnormal   Collection Time: 07/19/17  9:43 AM  Result Value Ref Range   DIAGNOSIS: Comment (A)     Comment: EPITHELIAL CELL ABNORMALITY. ATYPICAL SQUAMOUS CELLS OF UNDETERMINED SIGNIFICANCE.    Specimen adequacy: Comment     Comment: Satisfactory for evaluation. Endocervical and/or squamous metaplastic cells (endocervical component) are present.    Clinician Provided ICD10 Comment     Comment: Z87.410   Performed by: Comment     Comment: Charlynne Cousins, Cytotechnologist (ASCP)   Electronically signed by: Comment     Comment: Hoy Finlay, MD, Pathologist   PAP Smear Comment .    PATHOLOGIST PROVIDED ICD10: Comment     Comment: R87.610   Note: Comment     Comment: The Pap smear is a screening test designed to aid in the detection of premalignant and malignant conditions of the uterine cervix.  It is not a diagnostic procedure and should not be used as the sole means of detecting cervical cancer.  Both false-positive and false-negative reports do occur.    Test Methodology Comment     Comment: This liquid based ThinPrep(R) pap test was screened with the use of an image guided system.    Objective  Body mass index is 30.2 kg/m. Wt Readings from Last 3 Encounters:  07/27/17 170 lb 8 oz (77.3 kg)  07/19/17 172 lb (78 kg)  06/25/17 167 lb 6 oz (75.9 kg)    Temp Readings from Last 3 Encounters:  07/27/17 97.6 F (36.4 C) (Oral)  04/09/17 98.7 F (37.1 C) (Oral)  12/11/16 97.7 F (36.5 C) (Oral)   BP Readings from Last 3 Encounters:  07/27/17 132/83  07/19/17 100/70  06/25/17 110/84   Pulse Readings from Last 3 Encounters:  07/27/17 70  07/19/17 71  06/25/17 66   O2 sat 96%   Physical Exam  Constitutional: She is oriented to person, place, and time and well-developed, well-nourished, and in no distress. Vital signs are normal.  HENT:  Head: Normocephalic and atraumatic.  Mouth/Throat: Oropharynx is clear and moist and mucous membranes are normal.    Eyes: Conjunctivae are normal. Pupils are equal, round, and reactive to light.  Cardiovascular: Normal rate, regular rhythm and normal heart sounds.  Pulmonary/Chest: Effort normal and breath sounds normal. She has no wheezes.  Neurological: She is alert and  oriented to person, place, and time. Gait normal. Gait normal.  Skin: Skin is warm, dry and intact.  Psychiatric: Mood, memory, affect and judgment normal.  Nursing note and vitals reviewed.   Assessment   1. Depression/insomnia  2. URI with sore in right nare  3. Recent ASCUS pap 07/19/17 Dr. Kenton Kingfisher  4. HM Plan  1.improved cont meds and therapy Dr. Shea Evans  2. OTC supportive care bactroban nasal bid x 5 dasy  3. F/u with Ob/gyn in 6 months Dr. Kenton Kingfisher Of note she has not started using Clonidine patch 0.1 for hot flashes and tried a trial off Estradiol today   4. Disc and given Rx hep B vaccine, Tdap, shingrix today  Had flu shot   Pap see above  Pt wants to hold on seeing GI for chronic abdominal issues for now since bad experience in 2015  mammo neg 06/27/17   Of note she did talk to Sleep Med about equip check   Provider: Dr. Olivia Mackie [name]-[facility]

## 2017-08-03 ENCOUNTER — Encounter: Payer: Self-pay | Admitting: Obstetrics & Gynecology

## 2017-08-06 ENCOUNTER — Ambulatory Visit
Admission: RE | Admit: 2017-08-06 | Discharge: 2017-08-06 | Disposition: A | Discharge status: Home / Self Care | Payer: Managed Care, Other (non HMO) | Source: Ambulatory Visit | Attending: Physician Assistant | Admitting: Physician Assistant

## 2017-08-06 ENCOUNTER — Other Ambulatory Visit: Payer: Self-pay | Admitting: Physician Assistant

## 2017-08-06 DIAGNOSIS — R52 Pain, unspecified: Secondary | ICD-10-CM

## 2017-08-12 IMAGING — CT CT ABD-PELV W/ CM
2 of 5 series · 15 of 46 positions shown, 17 images · IV contrast (APPLIED)
Comparison: 02/25/2014 and 09/05/2004 CT abdomen and pelvis.

CLINICAL DATA: 50-year-old female with lower abdominal pain
extending to back for the past 3 days. Prior hysterectomy,
appendectomy and bladder stretching. History of Crohn's disease.
Initial encounter.

EXAM:
CT ABDOMEN AND PELVIS WITH CONTRAST
TECHNIQUE: Multidetector CT imaging of the abdomen and pelvis was performed
using the standard protocol following bolus administration of
intravenous contrast.
CONTRAST:  100mL [id number] IOPAMIDOL ([id number]-9II) INJECTION 61%

[Series 2: axial st · axial · 0.80mm/px · z∈[-1153,-753]mm · 12 of 90 slices shown, 14 images]
[im 5/90  soft-tissue]
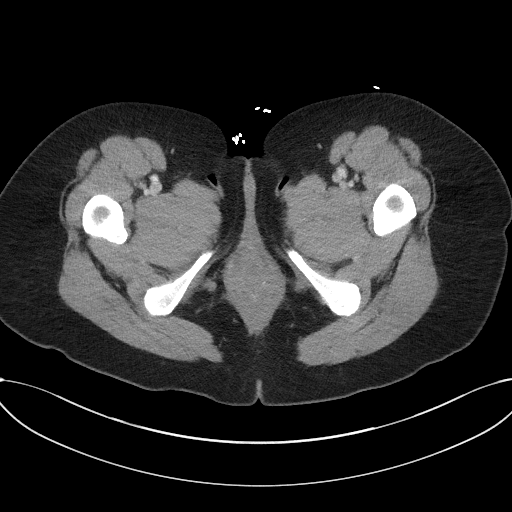
[im 5/90  bone]
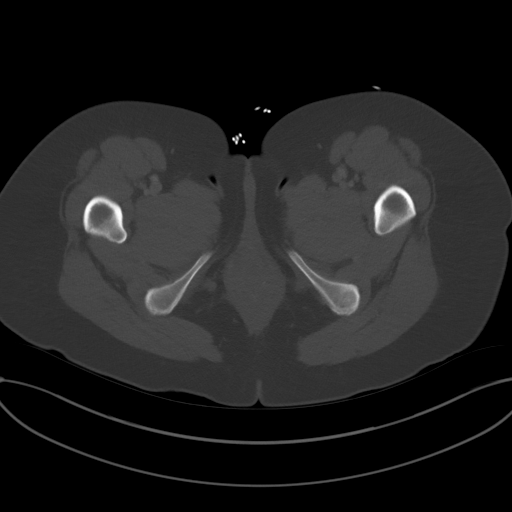
[im 15/90  soft-tissue]
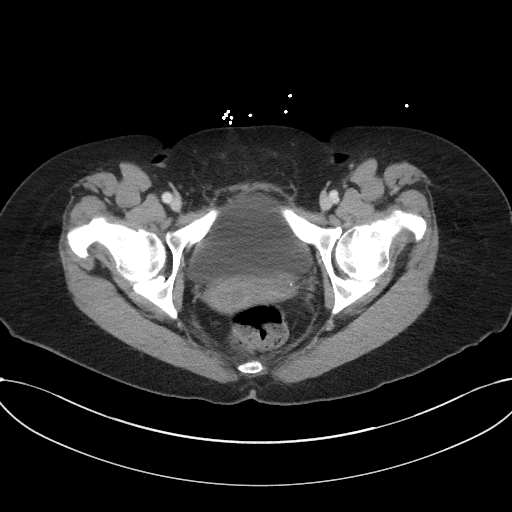
[im 19/90  soft-tissue]
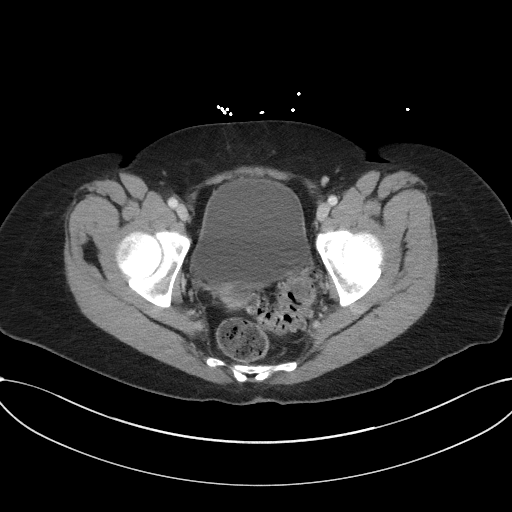
[im 29/90  soft-tissue]
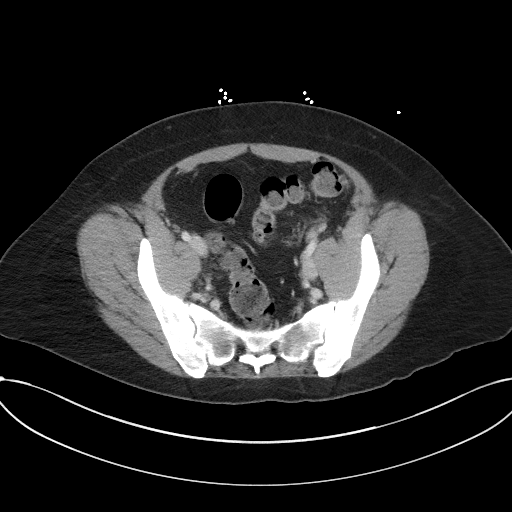
[im 33/90  soft-tissue]
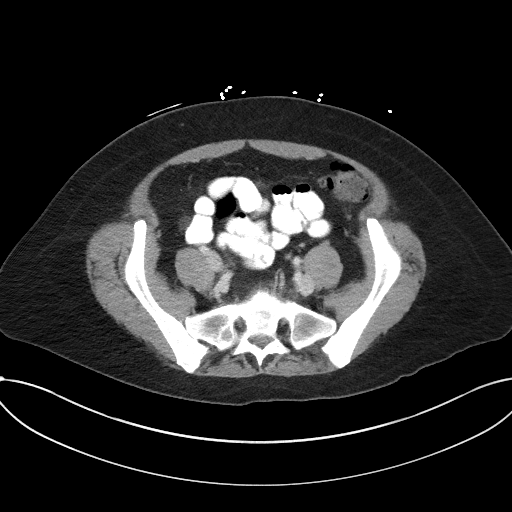
[im 43/90  soft-tissue]
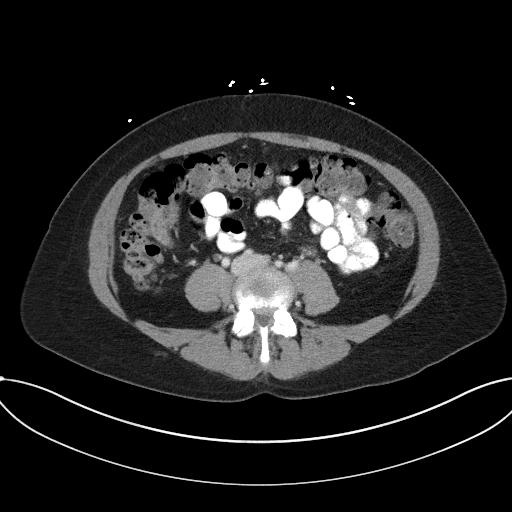
[im 47/90  soft-tissue]
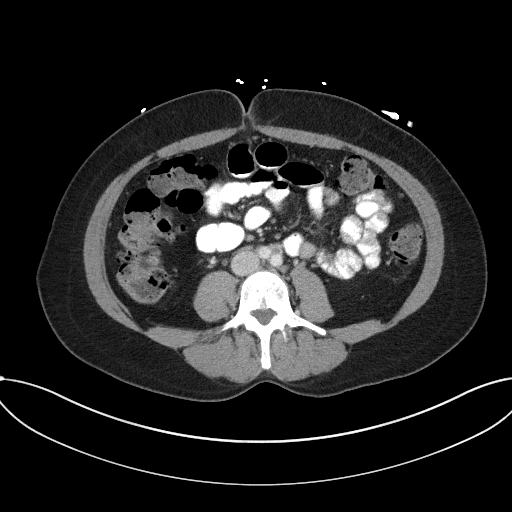
[im 57/90  soft-tissue]
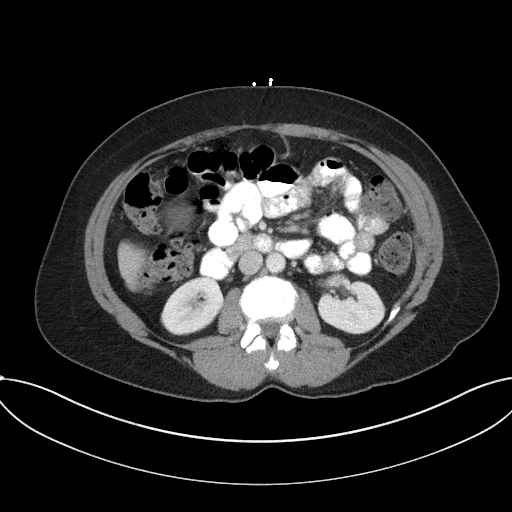
[im 61/90  soft-tissue]
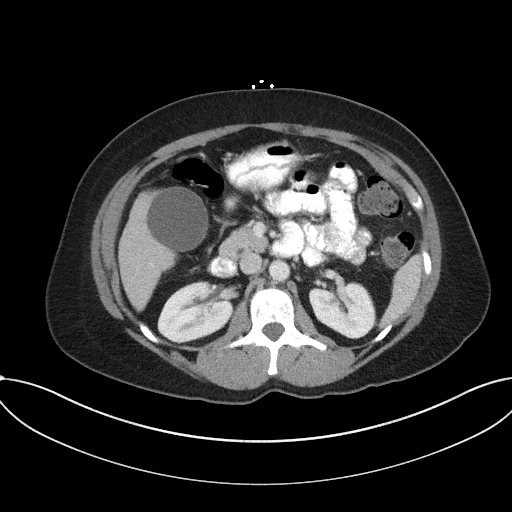
[im 61/90  bone]
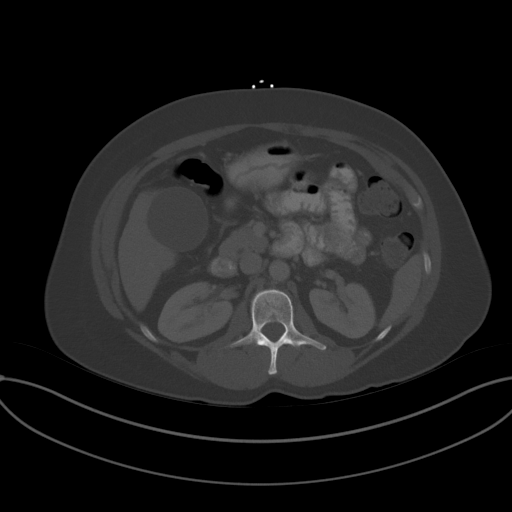
[im 71/90  soft-tissue]
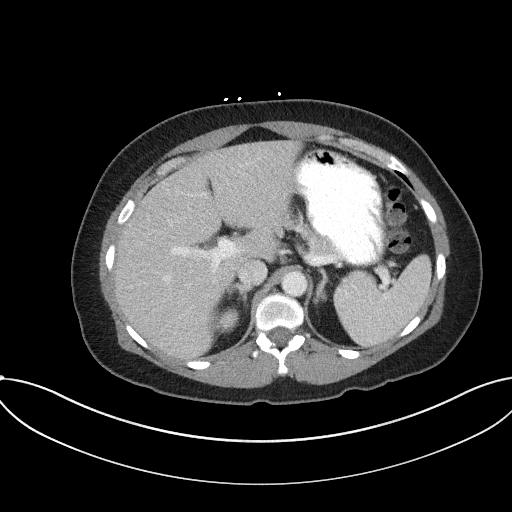
[im 75/90  soft-tissue]
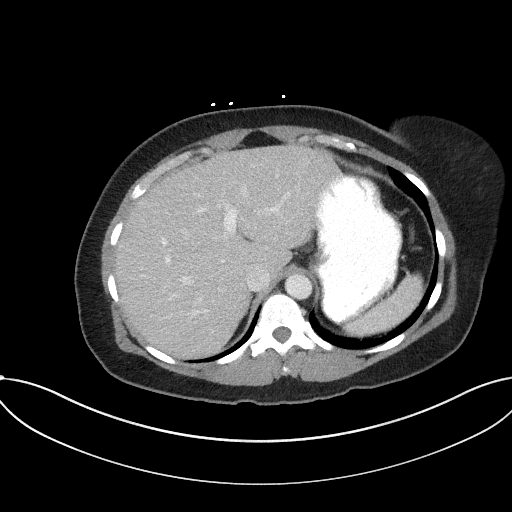
[im 85/90  soft-tissue]
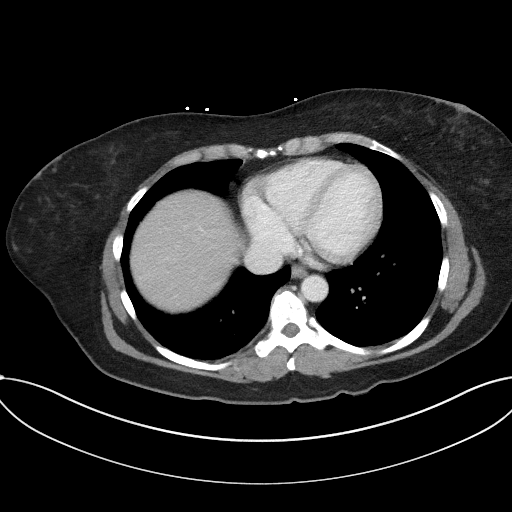

[Series 6: coronal st · coronal · 0.73mm/px · 3 of 87 slices shown]
[im 29/87  soft-tissue]
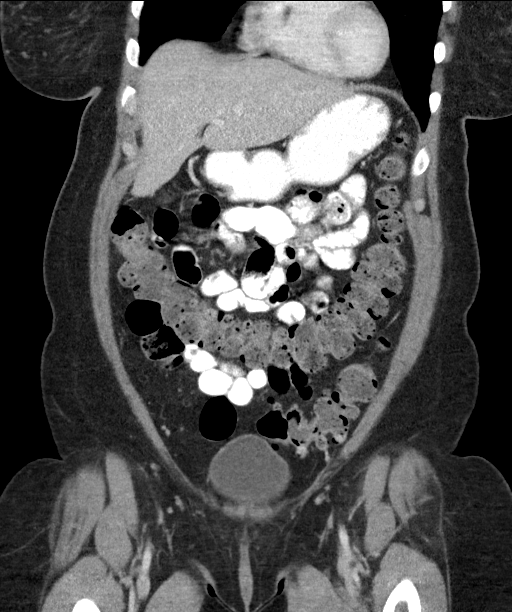
[im 39/87  soft-tissue]
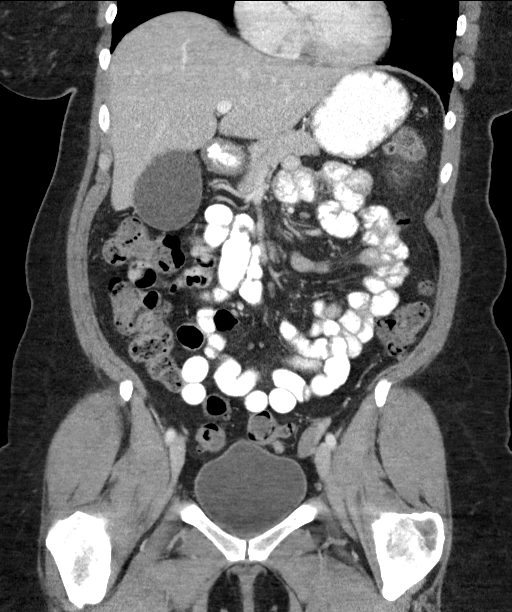
[im 48/87  soft-tissue]
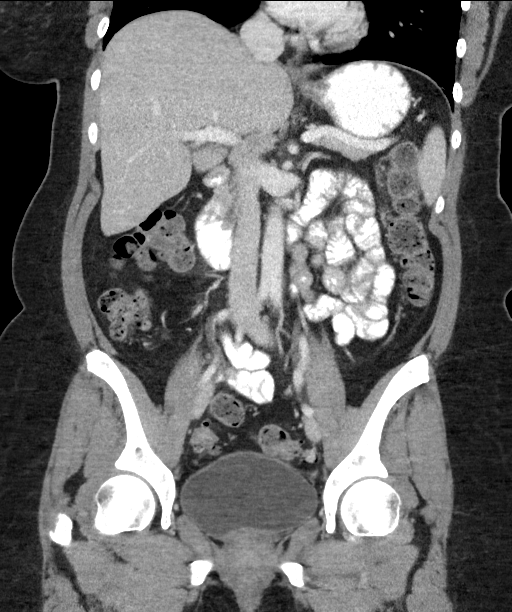

[15 of 46 positions shown; findings below may reference images not displayed]

FINDINGS: Lower chest:  Clear.

Hepatobiliary: Posterior inferior aspect right lobe of liver lesion
has increased over time presently measuring 3.3 x 2.2 x 2.9 cm. On
1148 exam, this measured 2.8 x 2.4 x 2.6 cm. There is peripheral
puddling type enhancement which is most consistent with a
hemangioma. This partially fills in on slightly delayed imaging
obtained. No calcified gallstones. Gallbladder is full.

Pancreas: No mass or inflammation.

Spleen: Normal size without mass.

Adrenals/Urinary Tract: No renal or adrenal mass. No hydronephrosis.
Noncontrast filled views of the urinary bladder unremarkable.

Stomach/Bowel: Inflammation at the level of the splenic flexure
along the anterior margin of proximal descending colon. This
inflammation surrounds a fatty structure suspicious for epiploic
appendagitis. There are scattered diverticula throughout the colon
most notable descending colon and sigmoid colon. Although there are
diverticula near the inflammation of the splenic flexure,
diverticulitis is felt to be a secondary less likely consideration
as is Crohn's disease. No abnormal fluid collection or free
intraperitoneal air.

Vascular/Lymphatic: No abdominal aortic aneurysm.  No adenopathy.

Reproductive: Per history patient is post hysterectomy. Within the
vaginal cuff, there is a 2 cm cystic structure of indeterminate
etiology.

Other: Minimal nodularity breast parenchyma with coarse
calcification on left.

Musculoskeletal: Mild curvature lumbar spine. Degenerative changes
most notable L5-S1 with prominent bilateral foraminal narrowing.
L4-5 protrusion greater to left.
IMPRESSION: Inflammation at the level of the splenic flexure along the anterior
margin of proximal descending colon suspicious for epiploic
appendagitis. Although there are diverticula near the inflammation,
diverticulitis is felt to be a secondary less likely consideration
as is Crohn's disease.

Posterior inferior aspect right lobe of liver lesion has increased
over time presently measuring 3.3 x 2.2 x 2.9 cm. On 1148 exam, this
measured 2.8 x 2.4 x 2.6 cm. There is peripheral puddling type
enhancement which is most consistent with a hemangioma.

Degenerative changes L4-5 and L5-S1.

Per history patient is post hysterectomy. Within the vaginal cuff,
there is a 2 cm cystic structure of indeterminate etiology.

## 2017-08-13 ENCOUNTER — Encounter: Payer: Self-pay | Admitting: Family Medicine

## 2017-08-13 ENCOUNTER — Ambulatory Visit: Payer: 59 | Admitting: Licensed Clinical Social Worker

## 2017-08-13 DIAGNOSIS — F411 Generalized anxiety disorder: Secondary | ICD-10-CM

## 2017-08-13 DIAGNOSIS — F329 Major depressive disorder, single episode, unspecified: Secondary | ICD-10-CM

## 2017-08-13 DIAGNOSIS — F331 Major depressive disorder, recurrent, moderate: Secondary | ICD-10-CM | POA: Diagnosis not present

## 2017-08-13 DIAGNOSIS — G894 Chronic pain syndrome: Secondary | ICD-10-CM

## 2017-08-13 DIAGNOSIS — F32A Depression, unspecified: Secondary | ICD-10-CM

## 2017-08-13 NOTE — Progress Notes (Signed)
Comprehensive Clinical Assessment (CCA) Note  08/13/2017 Carolyn Esparza [id number]  Visit Diagnosis:      ICD-10-CM   1. MDD (major depressive disorder), recurrent episode, moderate (HCC) F33.1   2. GAD (generalized anxiety disorder) F41.1   3. Chronic pain syndrome G89.4       CCA Part One  Part One has been completed on paper by the patient.  (See scanned document in Chart Review)  CCA Part Two A  Intake/Chief Complaint:  CCA Intake With Chief Complaint CCA Part Two Date: 08/13/17 CCA Part Two Time: 1512 Chief Complaint/Presenting Problem: My primary care referred me to Dr. Shea Evans and then she referred me to you.  Patients Currently Reported Symptoms/Problems: Patient reports that she has had depression most of her adult life.  Starting around age 52.  Reports that she is down in the dumps when she should not be.  Reports that she does not like her job.  Reports a good husband and a great place to live.  Reports that she gets about 14 hours of sleep daily.  Reports that she worries alot.  Reports chronic pain. Reports that she has been going to pain clinic for 15 years. Reports problems with hips in addition to back.  Reports that she has a CPap machine.  Denies having friends.  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  Has a friend for the past 30 years and they talk seldom.  Has half siblings.  Individual's Strengths: determined, perserverance, self sufficient Individual's Preferences: job Individual's Abilities: communicates well Type of Services Patient Feels Are Needed: therapy, medication management  Mental Health Symptoms Depression:  Depression: Sleep (too much or little), Irritability, Difficulty Concentrating, Change in energy/activity, Fatigue, Tearfulness  Mania:  Mania: N/A  Anxiety:   Anxiety: Worrying, Tension, Fatigue  Psychosis:  Psychosis: N/A  Trauma:  Trauma: (death of father)  Obsessions:  Obsessions: N/A  Compulsions:  Compulsions: N/A  Inattention:  Inattention: N/A   Hyperactivity/Impulsivity:  Hyperactivity/Impulsivity: N/A  Oppositional/Defiant Behaviors:  Oppositional/Defiant Behaviors: N/A  Borderline Personality:  Emotional Irregularity: N/A  Other Mood/Personality Symptoms:      Mental Status Exam Appearance and self-care  Stature:  Stature: Average  Weight:  Weight: Average weight  Clothing:  Clothing: Neat/clean  Grooming:  Grooming: Normal  Cosmetic use:  Cosmetic Use: None  Posture/gait:  Posture/Gait: Normal  Motor activity:  Motor Activity: Not Remarkable  Sensorium  Attention:  Attention: Normal  Concentration:  Concentration: Normal  Orientation:  Orientation: X5  Recall/memory:  Recall/Memory: Normal  Affect and Mood  Affect:  Affect: Appropriate  Mood:  Mood: Depressed  Relating  Eye contact:  Eye Contact: Normal  Facial expression:  Facial Expression: Responsive  Attitude toward examiner:  Attitude Toward Examiner: Cooperative  Thought and Language  Speech flow: Speech Flow: Normal  Thought content:  Thought Content: Appropriate to mood and circumstances  Preoccupation:     Hallucinations:     Organization:     Transport planner of Knowledge:  Fund of Knowledge: Average  Intelligence:  Intelligence: Average  Abstraction:  Abstraction: Normal  Judgement:  Judgement: Normal  Reality Testing:  Reality Testing: Adequate  Insight:  Insight: Good  Decision Making:  Decision Making: Normal  Social Functioning  Social Maturity:  Social Maturity: Isolates  Social Judgement:  Social Judgement: Normal  Stress  Stressors:  Stressors: Illness, Grief/losses  Coping Ability:     Skill Deficits:     Supports:      Family and Psychosocial History: Family  history Marital status: Married Number of Years Married: 23 What types of issues is patient dealing with in the relationship?: denies Are you sexually active?: Yes What is your sexual orientation?: heterosexual Does patient have children?: Yes How many children?:  0(1 stepson) How is patient's relationship with their children?: Reports that she loves her stepson but he has no responsibility as a 52 year old. Reports that he has a 52 year old [place].   Childhood History:  Childhood History By whom was/is the patient raised?: Father Additional childhood history information: Born in [place].  parents divorced when she was 39 or 7.  reports never got over it. Description of patient's relationship with caregiver when they were a child: Mother: she was an alcoholic.  She took off and left me with him.  She took everything out of the home. Father: reports a good relationship.  Typical kids stuff I did not listen sometimes but we had a good relationship. Patient's description of current relationship with people who raised him/her: Both parents deceased How were you disciplined when you got in trouble as a child/adolescent?: "I wasn't" Does patient have siblings?: Yes Number of Siblings: 2 Description of patient's current relationship with siblings: has half older siblings, 65 & 8 year difference.  minimal relationship. reports distant relationship. Did patient suffer any verbal/emotional/physical/sexual abuse as a child?: No Did patient suffer from severe childhood neglect?: No Has patient ever been sexually abused/assaulted/raped as an adolescent or adult?: No Was the patient ever a victim of a crime or a disaster?: No Witnessed domestic violence?: No Has patient been effected by domestic violence as an adult?: No  CCA Part Two B  Employment/Work Situation: Employment / Work Copywriter, advertising Employment situation: Employed Where is patient currently employed?: [employer] long has patient been employed?: 18 Patient's job has been impacted by current illness: Yes Describe how patient's job has been impacted: dread to go to work, works from home most days What is the longest time patient has a held a job?: 18years Where was the patient employed at that  time?: [employer] Has patient ever been in the [employer]?: No  Education: Education Name of [place]: [employer] Did Teacher, adult education From [employer]?: Yes Did [place]?: Yes What Type of College Degree Do you Have?: Associates 1997-1999 Did [place]?: No 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 Did You Have An Individualized Education Program (IIEP): No Did You Have Any Difficulty At [employer]?: No  Religion: Religion/Spirituality Are You A Religious Person?: No  Leisure/Recreation: Leisure / Recreation Leisure and Hobbies: watch tv, movies, playing with dog, interacting with husband, taking trips  Exercise/Diet: Exercise/Diet Do You Exercise?: (attempts to but only able to get on treadmill about 66min) Have You Gained or Lost A Significant Amount of Weight in the Past Six Months?: No Do You Follow a Special Diet?: No Do You Have Any Trouble Sleeping?: No  CCA Part Two C  Alcohol/Drug Use: Alcohol / Drug Use Pain Medications: vicadin, fentanyl patch, lidocaine patches Prescriptions: wellbutrin, zoloft,  trazadone, cholesterol, nausea,  Over the Counter: advil, vitamin c, multivitamin, fish oil History of alcohol / drug use?: No history of alcohol / drug abuse                      CCA Part Three  ASAM's:  Six Dimensions of Multidimensional Assessment  Dimension 1:  Acute Intoxication and/or Withdrawal Potential:     Dimension 2:  Biomedical Conditions and Complications:  Dimension 3:  Emotional, Behavioral, or Cognitive Conditions and Complications:     Dimension 4:  Readiness to Change:     Dimension 5:  Relapse, Continued use, or Continued Problem Potential:     Dimension 6:  Recovery/Living Environment:      Substance use Disorder (SUD)    Social Function:  Social Functioning Social Maturity: Isolates Social Judgement: Normal  Stress:  Stress Stressors: Illness, Grief/losses Patient Takes Medications The Way The Doctor Instructed?:  Yes Priority Risk: Low Acuity  Risk Assessment- Self-Harm Potential: Risk Assessment For Self-Harm Potential Thoughts of Self-Harm: No current thoughts Method: No plan Availability of Means: No access/NA  Risk Assessment -Dangerous to Others Potential: Risk Assessment For Dangerous to Others Potential Method: No Plan Availability of Means: No access or NA Intent: Vague intent or NA Notification Required: No need or identified person  DSM5 Diagnoses: Patient Active Problem List   Diagnosis Date Noted  . Abnormal Pap smear of cervix 07/27/2017  . Vasomotor flushing 07/19/2017  . Depression 07/18/2017  . Nausea 06/25/2017  . Hip pain, right 06/25/2017  . OSA (obstructive sleep apnea) 04/18/2017  . Annual physical exam 04/10/2017  . Fatigue 12/16/2016  . Chronic back pain 05/30/2016  . Abdominal pain 03/12/2016  . Anxiety and depression 12/28/2015  . History of obstructive sleep apnea 12/15/2015  . Thyroid nodule 12/15/2015  . GERD (gastroesophageal reflux disease) 12/15/2015  . Hyperlipidemia 12/15/2015  . Pain medication agreement signed 03/25/2015  . CAD in native artery 02/05/2015  . Right supraspinatus tenosynovitis 08/17/2014  . Crohn's disease involving terminal ileum ([place]) 03/30/2014  . FH: colon polyps 03/30/2014  . 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 Other bursitis disorders 07/12/2012  . Pain in joint, pelvic region and thigh 07/12/2012  . Crohn's disease ([place]) 01/23/2012    Patient Centered Plan: Patient is on the following Treatment Plan(s):  Anxiety and Depression  Recommendations for Services/Supports/Treatments: Recommendations for Services/Supports/Treatments Recommendations For Services/Supports/Treatments: Individual Therapy, Medication Management  Treatment Plan Summary:    Referrals to Alternative Service(s): Referred to Alternative Service(s):   Place:   Date:   Time:    Referred to Alternative Service(s):   Place:   Date:   Time:     Referred to Alternative Service(s):   Place:   Date:   Time:    Referred to Alternative Service(s):   Place:   Date:   Time:     [place]

## 2017-08-14 MED ORDER — SERTRALINE HCL 50 MG PO TABS: 50.0000 mg | ORAL_TABLET | Freq: Every day | ORAL | 1 refills | Status: DC

## 2017-08-14 NOTE — Telephone Encounter (Signed)
Refills have been done

## 2017-08-15 ENCOUNTER — Other Ambulatory Visit: Payer: Self-pay

## 2017-08-15 ENCOUNTER — Ambulatory Visit: Payer: 59 | Admitting: Psychiatry

## 2017-08-15 ENCOUNTER — Encounter: Payer: Self-pay | Admitting: Psychiatry

## 2017-08-15 VITALS — BP 125/81 | HR 67 | Temp 98.3°F | Wt 168.8 lb

## 2017-08-15 DIAGNOSIS — F331 Major depressive disorder, recurrent, moderate: Secondary | ICD-10-CM

## 2017-08-15 DIAGNOSIS — G894 Chronic pain syndrome: Secondary | ICD-10-CM

## 2017-08-15 DIAGNOSIS — F411 Generalized anxiety disorder: Secondary | ICD-10-CM | POA: Diagnosis not present

## 2017-08-15 DIAGNOSIS — Z9989 Dependence on other enabling machines and devices: Secondary | ICD-10-CM

## 2017-08-15 DIAGNOSIS — G4733 Obstructive sleep apnea (adult) (pediatric): Secondary | ICD-10-CM

## 2017-08-15 LAB — FECAL OCCULT BLOOD, IMMUNOCHEMICAL: FECAL OCCULT BLD: NEGATIVE

## 2017-08-15 NOTE — Progress Notes (Signed)
[facility] MD OP Progress Note  08/15/2017 4:53 PM Carolyn Esparza  MRN:  [id number]  Chief Complaint: ' I am still in pain."  Chief Complaint    Follow-up; Medication Refill     HPI: Carolyn Esparza a 52 year old Caucasian female who is married, employed, lives in [place], has a history of depression, anxiety, chronic pain, heart disease, thyroid problems, OSA on CPAP, presented to the clinic today for a follow-up visit.  Carolyn Esparza today reports that she continues to struggle with pain which is a major problem for her.  Patient reports that her pain has moved down from her back to her hip and her lower extremities.  She reports that she continues to struggle with it at bed night.  And this also affects her sleep.  She reports she is recently had a consultation with pain specialist in [place] and she is going to have a second appointment with him in February.  She reports she is most likely going to get injections and is also looking into experimental treatment for pain management like PRP, stem cell injections and so on.  She reports she is looking forward to that so that she can get off of the narcotic pain medications and get help.  She continues to struggle with sleep.  She also has OSA and has restarted using her CPAP.  She reports her CPAP mask leaks air in the middle of the night and that also wakes her up.  She also has a lot of pain and has to change her sleep position throughout the night.  She reports the trazodone helps to fall asleep but her sleep is constantly disrupted due to her pain as well as her CPAP mask.  She reports her insurance might let her do another sleep study soon and her primary medical doctor is aware of this.  She also feels once her pain is more under control she will be able to rest better.  She reports that Wellbutrin and Zoloft as keeping her mood better.  She wants to stay on the Zoloft at 50 mg at this time and give it more time.  She denies any suicidality.  She denies abusing  any drugs or alcohol.  She reports she may also be struggling with menopausal symptoms.  And her primary medical doctor recently took her off of the estrogen patch and has started clonidine patch for her.  She reports she has not started it yet .  She reports she continues to be employed.  Her husband he needs to be supportive.   Visit Diagnosis:    ICD-10-CM   1. MDD (major depressive disorder), recurrent episode, moderate (HCC) F33.1   2. GAD (generalized anxiety disorder) F41.1   3. OSA on CPAP G47.33    Z99.89   4. Chronic pain syndrome G89.4     Past Psychiatric History: History of depression since the past several years.  She has been on multiple antidepressants.  She reports her primary medical doctor has always prescribed them to her.  She also tried psychotherapy through [facility] in the past.  She denies any suicide attempts.  She denies mental health admissions.  Past trials of Cymbalta-worked, gabapentin-did not work  Past Medical History:  Past Medical History:  Diagnosis Date  . Anxiety   . ASCUS with positive high risk HPV cervical   . CAD (coronary artery disease)   . Chicken pox   . Depression   . GERD (gastroesophageal reflux disease)   . History of Crohn's disease   .  Hyperlipidemia   . Left ovarian cyst    s/p removal of 1 ovary ? which one removed per pt   . Libido, decreased   . Thyroid nodule     Past Surgical History:  Procedure Laterality Date  . ABDOMINAL HYSTERECTOMY    . APPENDECTOMY  2004  . BLADDER SURGERY    . BREAST BIOPSY     x 2  . COLONOSCOPY    . OOPHORECTOMY     x1 ? which one removed per pt   . WISDOM TOOTH EXTRACTION      Family Psychiatric History: Cousin-schizophrenia, another cousin-mental health problems possibly schizophrenia.  This cousin was in [place] all her life and patient was her legal guardian the last 5 years of her life.  She reports addiction runs in her maternal side of the family.  Her mom is also an alcoholic.  She has  a niece who has intellectual disability.  Denies any suicide in her family.  Family History:  Family History  Problem Relation Age of Onset  . Alcohol abuse Mother   . Hyperlipidemia Mother   . Heart disease Mother   . Hypertension Mother   . Diabetes Mother   . Crohn's disease Sister        1/2 sister  . Depression Sister   . Leukemia Paternal Grandmother   . Cancer Paternal Aunt        blood, type unknown   Substance abuse history: Denies  Social History: She is married.  She lives in [place].  She works as an Education administrator at [employer].  She has a Environmental consultant and 2 grandchildren Social History   Socioeconomic History  . Marital status: Married    Spouse name: douglas  . Number of children: 0  . Years of education: None  . Highest education level: Associate degree: occupational, Hotel manager, or vocational program  Social Needs  . Financial resource strain: Not hard at all  . Food insecurity - worry: Never true  . Food insecurity - inability: Never true  . Transportation needs - medical: No  . Transportation needs - non-medical: No  Occupational History  . 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 Smoking status: Former Smoker    Types: Cigarettes    Last attempt to quit: 08/29/2000    Years since quitting: 16.9  . Smokeless tobacco: Never Used  Substance and Sexual Activity  . Alcohol use: No    Alcohol/week: 0.0 oz    Comment: social   . Drug use: No  . Sexual activity: Not Currently  Other Topics Concern  . 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 Propofol Palpitations    BP drops and EKG changes, bladder retention  . Nsaids Other (See Comments)    Flare of Crohn's.  . Pentasa [Mesalamine] Other (See Comments)    Chest pain.    Metabolic Disorder Labs: No results found for: HGBA1C, MPG No results found for: PROLACTIN Lab Results  Component Value Date   CHOL 142  07/06/2017   TRIG 135 07/06/2017   HDL 41 07/06/2017   VLDL 34 05/20/2014   LDLCALC 74 07/06/2017   LDLCALC 116 (H) 06/06/2016   Lab Results  Component Value Date   TSH 2.080 07/06/2017    Therapeutic Level Labs: No results found for: LITHIUM No results found for: VALPROATE No components found  for:  CBMZ  Current Medications: Current Outpatient Medications  Medication Sig Dispense Refill  . Ascorbic Acid (VITAMIN C) 1000 MG tablet Take 1,000 mg by mouth daily.     Marland Kitchen aspirin EC 81 MG tablet Take 81 mg by mouth daily.    Marland Kitchen atorvastatin (LIPITOR) 10 MG tablet Take 1 tablet (10 mg total) by mouth daily. 90 tablet 3  . buPROPion (WELLBUTRIN XL) 300 MG 24 hr tablet TAKE 1 TABLET BY MOUTH  DAILY 90 tablet 1  . cloNIDine (CATAPRES - DOSED IN MG/24 HR) 0.1 mg/24hr patch Place 1 patch (0.1 mg total) onto the skin once a week. 4 patch 12  . DOCOSAHEXAENOIC ACID PO Take by mouth.    . docusate sodium (DOK) 100 MG capsule TAKE 1 CAPSULE(100 MG) BY MOUTH TWICE DAILY    . fentaNYL (DURAGESIC - DOSED MCG/HR) 25 MCG/HR patch Place 25 mcg onto the skin every 3 (three) days.     Marland Kitchen HYDROcodone-acetaminophen (NORCO) 10-325 MG tablet Take 1 tablet by mouth every 6 (six) hours as needed.     . lidocaine (LIDODERM) 5 % Place 2 patches onto the skin daily.     . Multiple Vitamin (MULTI-VITAMINS) TABS Take 1 tablet by mouth daily.     . mupirocin nasal ointment (BACTROBAN NASAL) 2 % Bid nose right x 5 days 10 g 0  . Omega-3 Fatty Acids (FISH OIL PO) Take 1 capsule by mouth daily.     Marland Kitchen omeprazole (PRILOSEC) 20 MG capsule Take 1 capsule (20 mg total) by mouth daily. 90 capsule 1  . promethazine (PHENERGAN) 25 MG tablet TAKE 1 TABLET(25 MG) BY MOUTH EVERY 8 HOURS AS NEEDED FOR NAUSEA OR VOMITING 30 tablet 0  . sertraline (ZOLOFT) 50 MG tablet Take 1 tablet (50 mg total) by mouth daily with breakfast. 90 tablet 1  . traZODone (DESYREL) 50 MG tablet Take 0.5-1 tablets (25-50 mg total) by mouth at bedtime as  needed for sleep. 30 tablet 2  . Turmeric Curcumin 500 MG CAPS Take by mouth.    . valACYclovir (VALTREX) 1000 MG tablet   0   No current facility-administered medications for this visit.      Musculoskeletal: Strength & Muscle Tone: within normal limits Gait & Station: normal Patient leans: N/A  Psychiatric Specialty Exam: Review of Systems  Musculoskeletal: Positive for back pain and joint pain.  Psychiatric/Behavioral: Positive for depression. The patient is nervous/anxious and has insomnia.   All other systems reviewed and are negative.   Blood pressure 125/81, pulse 67, temperature 98.3 F (36.8 C), temperature source Oral, weight 168 lb 12.8 oz (76.6 kg).Body mass index is 29.9 kg/m.  General Appearance: Casual  Eye Contact:  Fair  Speech:  Clear and Coherent  Volume:  Normal  Mood:  Dysphoric  Affect:  Congruent  Thought Process:  Goal Directed and Descriptions of Associations: Intact  Orientation:  Full (Time, Place, and Person)  Thought Content: Logical   Suicidal Thoughts:  No  Homicidal Thoughts:  No  Memory:  Immediate;   Fair Recent;   Fair Remote;   Fair  Judgement:  Fair  Insight:  Fair  Psychomotor Activity:  Normal  Concentration:  Concentration: Fair and Attention Span: Fair  Recall:  [employer] of Knowledge: Fair  Language: Fair  Akathisia:  No  Handed:  Right  AIMS (if indicated): na  Assets:  Communication Skills Desire for [place] Talents/Skills Transportation Vocational/Educational  ADL's:  Intact  Cognition:  WNL  Sleep:  restless   Screenings: PHQ2-9     Office Visit from 06/25/2017 in [facility]  PHQ-2 Total Score  3  PHQ-9 Total Score  14       Assessment and Plan: Carolyn Esparza is a 52 year old Caucasian female who has a history of depression, anxiety, chronic pain as well as OSA on CPAP who presented to the clinic today for a follow-up visit.  Carolyn Esparza continues to struggle with  chronic pain and is currently pursuing further treatment for the same.  She continues to struggle with sleep which is also restless due to her pain issues.  She reports her mood symptoms are currently stable on the current medications.  She does have positive family history which also makes her biologically predisposed.  She continues to benefit from psychotherapy here in clinic.  Plan as noted below.  Plan For depression Continue Wellbutrin XL 300 mg p.o. daily Continue Zoloft 50 mg p.o. daily Continue CBT with Ms. Royal Piedra  For anxiety sx Zoloft 50 mg p.o. daily Continue CBT  For insomnia Continue trazodone 25-50 mg p.o. nightly as needed She also has OSA currently on CPAP.  Chronic pain She will follow-up with this new pain specialist in [place] and has an appointment scheduled for alternative therapy.   Follow-up in clinic in 2 months or sooner if needed.  More than 50 % of the time was spent for psychoeducation and supportive psychotherapy and care coordination.  This note was generated in part or whole with voice recognition software. Voice recognition is usually quite accurate but there are transcription errors that can and very often do occur. I apologize for any typographical errors that were not detected and corrected.       Ursula Alert, MD 08/15/2017, 4:53 PM

## 2017-09-03 ENCOUNTER — Telehealth: Payer: Self-pay

## 2017-09-03 NOTE — Telephone Encounter (Signed)
appt 09/07/17

## 2017-09-03 NOTE — Telephone Encounter (Signed)
Copied from [place] [id number]. Topic: Inquiry >> Sep 03, 2017  2:44 PM Mot[place], Claiborne [place], [place] wrote: Reason for CRM: Patient calling to make sure that her medical records were received in our office from the [facility] clinic, Dr. Ginette Pitman.If someone could give her a call just to let her know if you have gotten them or not at [phone]

## 2017-09-07 ENCOUNTER — Encounter: Payer: Self-pay | Admitting: Internal Medicine

## 2017-09-07 ENCOUNTER — Ambulatory Visit: Payer: Managed Care, Other (non HMO) | Admitting: Internal Medicine

## 2017-09-07 ENCOUNTER — Ambulatory Visit (INDEPENDENT_AMBULATORY_CARE_PROVIDER_SITE_OTHER): Payer: Managed Care, Other (non HMO)

## 2017-09-07 ENCOUNTER — Encounter: Payer: Self-pay | Admitting: Obstetrics & Gynecology

## 2017-09-07 VITALS — BP 126/58 | HR 81 | Temp 98.3°F | Ht 67.0 in | Wt 171.8 lb

## 2017-09-07 DIAGNOSIS — F32A Depression, unspecified: Secondary | ICD-10-CM

## 2017-09-07 DIAGNOSIS — M25552 Pain in left hip: Secondary | ICD-10-CM

## 2017-09-07 DIAGNOSIS — M2550 Pain in unspecified joint: Secondary | ICD-10-CM

## 2017-09-07 DIAGNOSIS — M542 Cervicalgia: Secondary | ICD-10-CM

## 2017-09-07 DIAGNOSIS — M545 Low back pain: Secondary | ICD-10-CM | POA: Diagnosis not present

## 2017-09-07 DIAGNOSIS — M25551 Pain in right hip: Secondary | ICD-10-CM | POA: Diagnosis not present

## 2017-09-07 DIAGNOSIS — F329 Major depressive disorder, single episode, unspecified: Secondary | ICD-10-CM | POA: Diagnosis not present

## 2017-09-07 DIAGNOSIS — G8929 Other chronic pain: Secondary | ICD-10-CM

## 2017-09-07 NOTE — Progress Notes (Signed)
Pre visit review using our clinic review tool, if applicable. No additional management support is needed unless otherwise documented below in the visit note. 

## 2017-09-07 NOTE — Patient Instructions (Signed)
Try Salonpas over the counter for hip pain comes in gel and patch  We will let you know about your Xrays  F/u in 6-8 weeks sooner if needed  Take care   Cervical Sprain A cervical sprain is a stretch or tear in one or more of the tough, cord-like tissues that connect bones (ligaments) in the neck. Cervical sprains can range from mild to severe. Severe cervical sprains can cause the spinal bones (vertebrae) in the neck to be unstable. This can lead to spinal cord damage and can result in serious nervous system problems. The amount of time that it takes for a cervical sprain to get better depends on the cause and extent of the injury. Most cervical sprains heal in 4-6 weeks. What are the causes? Cervical sprains may be caused by an injury (trauma), such as from a motor vehicle accident, a fall, or sudden forward and backward whipping movement of the head and neck (whiplash injury). Mild cervical sprains may be caused by wear and tear over time, such as from poor posture, sitting in a chair that does not provide support, or looking up or down for long periods of time. What increases the risk? The following factors may make you more likely to develop this condition:  Participating in activities that have a high risk of trauma to the neck. These include contact sports, auto racing, gymnastics, and diving.  Taking risks when driving or riding in a motor vehicle, such as speeding.  Having osteoarthritis of the spine.  Having poor strength and flexibility of the neck.  A previous neck injury.  Having poor posture.  Spending a lot of time in certain positions that put stress on the neck, such as sitting at a computer for long periods of time.  What are the signs or symptoms? Symptoms of this condition include:  Pain, soreness, stiffness, tenderness, swelling, or a burning sensation in the front, back, or sides of the neck.  Sudden tightening of neck muscles that you cannot control (muscle  spasms).  Pain in the shoulders or upper back.  Limited ability to move the neck.  Headache.  Dizziness.  Nausea.  Vomiting.  Weakness, numbness, or tingling in a hand or an arm.  Symptoms may develop right away after injury, or they may develop over a few days. In some cases, symptoms may go away with treatment and return (recur) over time. How is this diagnosed? This condition may be diagnosed based on:  Your medical history.  Your symptoms.  Any recent injuries or known neck problems that you have, such as arthritis in the neck.  A physical exam.  Imaging tests, such as: ? X-rays. ? MRI. ? CT scan.  How is this treated? This condition is treated by resting and icing the injured area and doing physical therapy exercises. Depending on the severity of your condition, treatment may also include:  Keeping your neck in place (immobilized) for periods of time. This may be done using: ? A cervical collar. This supports your chin and the back of your head. ? A cervical traction device. This is a sling that holds up your head. This removes weight and pressure from your neck, and it may help to relieve pain.  Medicines that help to relieve pain and inflammation.  Medicines that help to relax your muscles (muscle relaxants).  Surgery. This is rare.  Follow these instructions at home: If you have a cervical collar:  Wear it as told by your health care provider.  Do not remove the collar unless instructed by your health care provider.  Ask your health care provider before you make any adjustments to your collar.  If you have long hair, keep it outside of the collar.  Ask your health care provider if you can remove the collar for cleaning and bathing. If you are allowed to remove the collar for cleaning or bathing: ? Follow instructions from your health care provider about how to remove the collar safely. ? Clean the collar by wiping it with mild soap and water and drying  it completely. ? If your collar has removable pads, remove them every 1-2 days and wash them by hand with soap and water. Let them air-dry completely before you put them back in the collar. ? Check your skin under the collar for irritation or sores. If you see any, tell your health care provider. Managing pain, stiffness, and swelling  If directed, use a cervical traction device as told by your health care provider.  If directed, apply heat to the affected area before you do your physical therapy or as often as told by your health care provider. Use the heat source that your health care provider recommends, such as a moist heat pack or a heating pad. ? Place a towel between your skin and the heat source. ? Leave the heat on for 20-30 minutes. ? Remove the heat if your skin turns bright red. This is especially important if you are unable to feel pain, heat, or cold. You may have a greater risk of getting burned.  If directed, put ice on the affected area: ? Put ice in a plastic bag. ? Place a towel between your skin and the bag. ? Leave the ice on for 20 minutes, 2-3 times a day. Activity  Do not drive while wearing a cervical collar. If you do not have a cervical collar, ask your health care provider if it is safe to drive while your neck heals.  Do not drive or use heavy machinery while taking prescription pain medicine or muscle relaxants, unless your health care provider approves.  Do not lift anything that is heavier than 10 lb (4.5 kg) until your health care provider tells you that it is safe.  Rest as directed by your health care provider. Avoid positions and activities that make your symptoms worse. Ask your health care provider what activities are safe for you.  If physical therapy was prescribed, do exercises as told by your health care provider or physical therapist. General instructions  Take over-the-counter and prescription medicines only as told by your health care  provider.  Do not use any products that contain nicotine or tobacco, such as cigarettes and e-cigarettes. These can delay healing. If you need help quitting, ask your health care provider.  Keep all follow-up visits as told by your health care provider or physical therapist. This is important. How is this prevented? To prevent a cervical sprain from happening again:  Use and maintain good posture. Make any needed adjustments to your workstation to help you use good posture.  Exercise regularly as directed by your health care provider or physical therapist.  Avoid risky activities that may cause a cervical sprain.  Contact a health care provider if:  You have symptoms that get worse or do not get better after 2 weeks of treatment.  You have pain that gets worse or does not get better with medicine.  You develop new, unexplained symptoms.  You have sores or  irritated skin on your neck from wearing your cervical collar. Get help right away if:  You have severe pain.  You develop numbness, tingling, or weakness in any part of your body.  You cannot move a part of your body (you have paralysis).  You have neck pain along with: ? Severe dizziness. ? Headache. Summary  A cervical sprain is a stretch or tear in one or more of the tough, cord-like tissues that connect bones (ligaments) in the neck.  Cervical sprains may be caused by an injury (trauma), such as from a motor vehicle accident, a fall, or sudden forward and backward whipping movement of the head and neck (whiplash injury).  Symptoms may develop right away after injury, or they may develop over a few days.  This condition is treated by resting and icing the injured area and doing physical therapy exercises. This information is not intended to replace advice given to you by your health care provider. Make sure you discuss any questions you have with your health care provider. Document Released: 05/14/2007 Document  Revised: 03/15/2016 Document Reviewed: 03/15/2016 Elsevier Interactive Patient Education  2018 [employer].   Hip Pain The hip is the joint between the upper legs and the lower pelvis. The bones, cartilage, tendons, and muscles of your hip joint support your body and allow you to move around. Hip pain can range from a minor ache to severe pain in one or both of your hips. The pain may be felt on the inside of the hip joint near the groin, or the outside near the buttocks and upper thigh. You may also have swelling or stiffness. Follow these instructions at home: Managing pain, stiffness, and swelling  If directed, apply ice to the injured area. ? Put ice in a plastic bag. ? Place a towel between your skin and the bag. ? Leave the ice on for 20 minutes, 2-3 times a day  Sleep with a pillow between your legs on your most comfortable side.  Avoid any activities that cause pain. General instructions  Take over-the-counter and prescription medicines only as told by your health care provider.  Do any exercises as told by your health care provider.  Record the following: ? How often you have hip pain. ? The location of your pain. ? What the pain feels like. ? What makes the pain worse.  Keep all follow-up visits as told by your health care provider. This is important. Contact a health care provider if:  You cannot put weight on your leg.  Your pain or swelling continues or gets worse after one week.  It gets harder to walk.  You have a fever. Get help right away if:  You fall.  You have a sudden increase in pain and swelling in your hip.  Your hip is red or swollen or very tender to touch. Summary  Hip pain can range from a minor ache to severe pain in one or both of your hips.  The pain may be felt on the inside of the hip joint near the groin, or the outside near the buttocks and upper thigh.  Avoid any activities that cause pain.  Record how often you have hip  pain, the location of the pain, what makes it worse and what it feels like. This information is not intended to replace advice given to you by your health care provider. Make sure you discuss any questions you have with your health care provider. Document Released: 01/04/2010 Document Revised: 06/19/2016  Document Reviewed: 06/19/2016 Elsevier Interactive Patient Education  [employer].

## 2017-09-08 ENCOUNTER — Encounter: Payer: Self-pay | Admitting: Internal Medicine

## 2017-09-09 ENCOUNTER — Encounter: Payer: Self-pay | Admitting: Internal Medicine

## 2017-09-09 NOTE — Progress Notes (Signed)
Chief Complaint  Patient presents with  . Hip Pain  . Neck Pain   Follow up chronic pain and menopause  1. She c/o neck pain and popping, stiffness, and tense sensation in neck especially tense on the right with normal ROM  2. She c/o b/l hip pain waking her up at night and right leg +numbness. Right hip pain R>L hip pain. Numbness is worse after sitting. For both pain issues she has tried Aspercream, Bengay, heat and Lidocaine patch w/o relief.   3. She has chronic back pain and follows with pain clinic on norco 10-325 and Fentanyl. Reviewed MRI lumbar 05/2016. She is s/p SI imaging b/l negative and considering stem cell jt injections these images were needed prior to these stem cell jt injections. She follows with [facility] pain clinic and has pending facet injections last SI injections were 5 years ago per pt and did not work. She has also seen Dr. Electa Sniff NS in the past a while ago on 2 different occassions. She has had chronic back pain x 15 years.   4. Menopausal hot flashes she has not started taking clonidine patch Rx by Dr. Kenton Kingfisher yet and is trying to get off estradiol.   5. Mood is better on Zoloft 50 mg qd     Review of Systems  Constitutional: Negative for weight loss.  HENT: Negative for hearing loss.   Eyes:       No vision changes   Respiratory: Negative for shortness of breath.   Cardiovascular: Negative for chest pain.  Gastrointestinal: Positive for abdominal pain.       +chroncic abdominal pain  Genitourinary:       +hot flashes   Musculoskeletal: Positive for back pain, joint pain and neck pain.  Skin: Negative for rash.  Psychiatric/Behavioral: Negative for depression.   Past Medical History:  Diagnosis Date  . Anxiety   . ASCUS with positive high risk HPV cervical   . CAD (coronary artery disease)   . Chicken pox   . Chronic pain    neck, back, b/l hips   . Depression   . GERD (gastroesophageal reflux disease)   . History of Crohn's disease   .  Hyperlipidemia   . Left ovarian cyst    s/p removal of 1 ovary ? which one removed per pt   . Libido, decreased   . Thyroid nodule    Past Surgical History:  Procedure Laterality Date  . ABDOMINAL HYSTERECTOMY    . APPENDECTOMY  2004  . BLADDER SURGERY    . BREAST BIOPSY     x 2  . COLONOSCOPY    . OOPHORECTOMY     x1 ? which one removed per pt   . WISDOM TOOTH EXTRACTION     Family History  Problem Relation Age of Onset  . Alcohol abuse Mother   . Hyperlipidemia Mother   . Heart disease Mother   . Hypertension Mother   . Diabetes Mother   . Crohn's disease Sister        1/2 sister  . Depression Sister   . Leukemia Paternal Grandmother   . Cancer Paternal Aunt        blood, type unknown   Social History   Socioeconomic History  . Marital status: Married    Spouse name: douglas  . Number of children: 0  . Years of education: Not on file  . Highest education level: Associate degree: occupational, Hotel manager, or vocational program  Social Needs  .  Financial resource strain: Not hard at all  . Food insecurity - worry: Never true  . Food insecurity - inability: Never true  . Transportation needs - medical: No  . Transportation needs - non-medical: No  Occupational History  . 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 Smoking status: Former Smoker    Types: Cigarettes    Last attempt to quit: 08/29/2000    Years since quitting: 17.0  . Smokeless tobacco: Never Used  Substance and Sexual Activity  . Alcohol use: No    Alcohol/week: 0.0 oz    Comment: social   . Drug use: No  . Sexual activity: Not Currently  Other Topics Concern  . Not on file  Social History Narrative   Married    Works [employer]    Current Meds  Medication Sig  . Ascorbic Acid (VITAMIN C) 1000 MG tablet Take 1,000 mg by mouth daily.   Marland Kitchen aspirin EC 81 MG tablet Take 81 mg by mouth daily.  Marland Kitchen atorvastatin (LIPITOR) 10 MG tablet Take 1 tablet (10 mg total) by mouth  daily.  Marland Kitchen buPROPion (WELLBUTRIN XL) 300 MG 24 hr tablet TAKE 1 TABLET BY MOUTH  DAILY  . cloNIDine (CATAPRES - DOSED IN MG/24 HR) 0.1 mg/24hr patch Place 1 patch (0.1 mg total) onto the skin once a week.  . DOCOSAHEXAENOIC ACID PO Take by mouth.  . docusate sodium (DOK) 100 MG capsule TAKE 1 CAPSULE(100 MG) BY MOUTH TWICE DAILY  . fentaNYL (DURAGESIC - DOSED MCG/HR) 25 MCG/HR patch Place 25 mcg onto the skin every 3 (three) days.   Marland Kitchen HYDROcodone-acetaminophen (NORCO) 10-325 MG tablet Take 1 tablet by mouth every 6 (six) hours as needed.   . lidocaine (LIDODERM) 5 % Place 2 patches onto the skin daily.   . Multiple Vitamin (MULTI-VITAMINS) TABS Take 1 tablet by mouth daily.   . mupirocin nasal ointment (BACTROBAN NASAL) 2 % Bid nose right x 5 days  . Omega-3 Fatty Acids (FISH OIL PO) Take 1 capsule by mouth daily.   Marland Kitchen omeprazole (PRILOSEC) 20 MG capsule Take 1 capsule (20 mg total) by mouth daily.  . promethazine (PHENERGAN) 25 MG tablet TAKE 1 TABLET(25 MG) BY MOUTH EVERY 8 HOURS AS NEEDED FOR NAUSEA OR VOMITING  . sertraline (ZOLOFT) 50 MG tablet Take 1 tablet (50 mg total) by mouth daily with breakfast.  . traZODone (DESYREL) 50 MG tablet Take 0.5-1 tablets (25-50 mg total) by mouth at bedtime as needed for sleep.  . Turmeric Curcumin 500 MG CAPS Take by mouth.  . valACYclovir (VALTREX) 1000 MG tablet    Allergies  Allergen Reactions  . Propofol Palpitations    BP drops and EKG changes, bladder retention  . Nsaids Other (See Comments)    Flare of Crohn's.  . Pentasa [Mesalamine] Other (See Comments)    Chest pain.   Recent Results (from the past 2160 hour(s))  Urinalysis, Routine w reflex microscopic     Status: Abnormal   Collection Time: 07/06/17  8:35 AM  Result Value Ref Range   Specific Gravity, UA 1.015 1.005 - 1.030   pH, UA 6.5 5.0 - 7.5   Color, UA Yellow Yellow   Appearance Ur Cloudy (A) Clear   Leukocytes, UA Negative Negative   Protein, UA Negative Negative/Trace    Glucose, UA Negative Negative   Ketones, UA Negative Negative   RBC, UA Negative Negative   Bilirubin, UA Negative Negative   Urobilinogen,  Ur 0.2 0.2 - 1.0 mg/dL   Nitrite, UA Negative Negative   Microscopic Examination Comment     Comment: Microscopic not indicated and not performed.  Lipid Panel w/o Chol/HDL Ratio     Status: None   Collection Time: 07/06/17  8:35 AM  Result Value Ref Range   Cholesterol, Total 142 100 - 199 mg/dL   Triglycerides 135 0 - 149 mg/dL   HDL 41 >39 mg/dL   VLDL Cholesterol Cal 27 5 - 40 mg/dL   LDL Calculated 74 0 - 99 mg/dL  T4, free     Status: None   Collection Time: 07/06/17  8:35 AM  Result Value Ref Range   Free T4 0.84 0.82 - 1.77 ng/dL  TSH     Status: None   Collection Time: 07/06/17  8:35 AM  Result Value Ref Range   TSH 2.080 0.450 - 4.500 uIU/mL  Hepatitis B surface antibody     Status: Abnormal   Collection Time: 07/06/17  8:35 AM  Result Value Ref Range   Hepatitis B Surf Ab Quant <3.1 (L) Immunity>9.9 mIU/mL    Comment:   Status of Immunity                     Anti-HBs Level   ------------------                     -------------- Inconsistent with Immunity                   0.0 - 9.9 Consistent with Immunity                          >9.9   Hepatitis B surface antigen     Status: None   Collection Time: 07/06/17  8:35 AM  Result Value Ref Range   Hepatitis B Surface Ag Negative Negative  Pap IG (Image Guided)     Status: Abnormal   Collection Time: 07/19/17  9:43 AM  Result Value Ref Range   DIAGNOSIS: Comment (A)     Comment: EPITHELIAL CELL ABNORMALITY. ATYPICAL SQUAMOUS CELLS OF UNDETERMINED SIGNIFICANCE.    Specimen adequacy: Comment     Comment: Satisfactory for evaluation. Endocervical and/or squamous metaplastic cells (endocervical component) are present.    Clinician Provided ICD10 Comment     Comment: Z87.410   Performed by: Comment     Comment: Charlynne Cousins, Cytotechnologist (ASCP)   Electronically signed by:  Comment     Comment: Hoy Finlay, MD, Pathologist   PAP Smear Comment .    PATHOLOGIST PROVIDED ICD10: Comment     Comment: R87.610   Note: Comment     Comment: The Pap smear is a screening test designed to aid in the detection of premalignant and malignant conditions of the uterine cervix.  It is not a diagnostic procedure and should not be used as the sole means of detecting cervical cancer.  Both false-positive and false-negative reports do occur.    Test Methodology Comment     Comment: This liquid based ThinPrep(R) pap test was screened with the use of an image guided system.   Fecal occult blood, imunochemical     Status: None   Collection Time: 08/10/17 12:00 AM  Result Value Ref Range   Fecal Occult Bld Negative Negative  Antinuclear Antib (ANA)     Status: None   Collection Time: 09/07/17 11:10 AM  Result Value Ref Range  Anit Nuclear Antibody(ANA) Negative Negative  Rheumatoid Factor     Status: None   Collection Time: 09/07/17 11:10 AM  Result Value Ref Range   Rhuematoid fact SerPl-aCnc <10.0 0.0 - 13.9 IU/mL  Sedimentation rate     Status: None   Collection Time: 09/07/17 11:10 AM  Result Value Ref Range   Sed Rate 23 0 - 40 mm/hr  C-reactive protein     Status: Abnormal   Collection Time: 09/07/17 11:10 AM  Result Value Ref Range   CRP 8.5 (H) 0.0 - 4.9 mg/L  CYCLIC CITRUL PEPTIDE ANTIBODY, IGG/IGA     Status: None (Preliminary result)   Collection Time: 09/07/17 11:10 AM  Result Value Ref Range   Cyclic Citrullin Peptide Ab WILL FOLLOW    Objective  Body mass index is 26.91 kg/m. Wt Readings from Last 3 Encounters:  09/07/17 171 lb 12.8 oz (77.9 kg)  07/27/17 170 lb 8 oz (77.3 kg)  07/19/17 172 lb (78 kg)   Temp Readings from Last 3 Encounters:  09/07/17 98.3 F (36.8 C) (Oral)  07/27/17 97.6 F (36.4 C) (Oral)  04/09/17 98.7 F (37.1 C) (Oral)   BP Readings from Last 3 Encounters:  09/07/17 (!) 126/58  07/27/17 132/83  07/19/17 100/70    Pulse Readings from Last 3 Encounters:  09/07/17 81  07/27/17 70  07/19/17 71    Physical Exam  Constitutional: She is oriented to person, place, and time and well-developed, well-nourished, and in no distress. Vital signs are normal.  HENT:  Head: Normocephalic and atraumatic.  Mouth/Throat: Oropharynx is clear and moist and mucous membranes are normal.  Eyes: Conjunctivae are normal. Pupils are equal, round, and reactive to light.  Cardiovascular: Normal rate, regular rhythm and normal heart sounds.  Pulmonary/Chest: Effort normal and breath sounds normal.  Musculoskeletal:       Right hip: She exhibits tenderness.       Left hip: She exhibits tenderness.       Cervical back: She exhibits tenderness. She exhibits normal range of motion.       Lumbar back: She exhibits tenderness.  R>L mild to mod ttp hips  Right neck tense   Neurological: She is alert and oriented to person, place, and time. Gait normal. Gait normal.  Skin: Skin is warm, dry and intact.  Psychiatric: Mood, memory, affect and judgment normal.  Nursing note and vitals reviewed.   Assessment   1. Cervicalgia 2. Chronic Low back pain MRI lumbar 05/2016 with multiple levels of disc degeneration, mild bulging L2-3, mild facet OA, mild formainal narrowing b/l w/o visible compression. New degenerative change L2/3 with bulging of disc and mild facet arthropathy, new left posterior disc herniation L4//5 with 1 cm fragment in the left lateral recess likely to compress left L5 nerve root. Chronic spondylosis L5-S1 with osteophytes and protrusion of disc more left. Mild foraminal narrowing b/l  3. B/l hip pain R>L 4. Depression improved  5. Menopausal hot flashes 6. HM  Plan   1. Xray neck today  Check RA/autoimmune w/u  2. NTD f/u pain clinic if worsening f/u Dr. Electa Sniff pt to let me know  Chronic chronic pain meds  Disc Salonpas  3. Xrays b/l hips and pelvis today  Consider MRI if negative vs referral to  SM/ortho other ddx bursitis which would not show on Xray  4. Cont zoloft 50 mg qd  5. Pt still weaning off estradiol has not started taking Clonidine patch per Dr. Kenton Kingfisher  6.  Had flu shot  [facility]ap [facility]  Consider hep B vaccine in future pt getting [facility] today  Consider shingrix in future she is on the waiting list   Sp partial hysterectomy. H/o Pap ASCUS 07/19/17 f/u with Dr. Kenton Kingfisher he wants to f/u in 1 year  Colonoscopy pt wants to hold as disc prev visits will keep encouraging given h/o Crohns per hx, FH polpys  Mammogram 06/26/17 neg  Dermatology saw recently Dr. Kellie Moor 07/2017 f/u in 1 year    Of note look in right nose at f/u to make sure sore resolved.  Provider: Dr. Olivia Mackie [name]-Internal Medicine

## 2017-09-10 LAB — SEDIMENTATION RATE: SED RATE: 23 mm/h (ref 0–40)

## 2017-09-10 LAB — RHEUMATOID FACTOR: Rhuematoid fact SerPl-aCnc: <10 IU/mL (ref 0.0–13.9)

## 2017-09-10 LAB — CYCLIC CITRUL PEPTIDE ANTIBODY, IGG/IGA: Cyclic Citrullin Peptide Ab: 5 units (ref 0–19)

## 2017-09-10 LAB — ANA: Anti Nuclear Antibody(ANA): NEGATIVE

## 2017-09-10 LAB — C-REACTIVE PROTEIN: CRP: 8.5 mg/L — AB (ref 0.0–4.9)

## 2017-09-13 ENCOUNTER — Other Ambulatory Visit: Payer: Self-pay | Admitting: Family Medicine

## 2017-09-13 NOTE — Telephone Encounter (Signed)
Last OV 09/07/17 last filled by Dr.Cook 04/10/17 90 1rf

## 2017-09-14 ENCOUNTER — Telehealth: Payer: Self-pay | Admitting: Internal Medicine

## 2017-09-14 NOTE — Telephone Encounter (Signed)
Copied from [place]. Topic: Referral - Status >> Sep 14, 2017  1:20 PM Arletha Grippe wrote: Reason for CRM: pt calling about referral to ger MRI and Physical therapy.  Pleas call pt back with update at [phone]

## 2017-09-14 NOTE — Telephone Encounter (Signed)
Please advise 

## 2017-09-18 ENCOUNTER — Other Ambulatory Visit: Payer: Self-pay | Admitting: Internal Medicine

## 2017-09-18 DIAGNOSIS — M25551 Pain in right hip: Secondary | ICD-10-CM

## 2017-09-18 DIAGNOSIS — M25552 Pain in left hip: Principal | ICD-10-CM

## 2017-09-18 DIAGNOSIS — M542 Cervicalgia: Secondary | ICD-10-CM

## 2017-09-18 NOTE — Progress Notes (Signed)
Ordered MRI b/l hips, MRI C spine  Will refer to Charyl Dancer b/l hip pain I believe they come to [facility]  [place]

## 2017-09-24 ENCOUNTER — Encounter: Payer: Self-pay | Admitting: Internal Medicine

## 2017-09-26 ENCOUNTER — Encounter: Payer: Self-pay | Admitting: Internal Medicine

## 2017-10-01 ENCOUNTER — Other Ambulatory Visit: Payer: Self-pay | Admitting: Internal Medicine

## 2017-10-01 DIAGNOSIS — M542 Cervicalgia: Secondary | ICD-10-CM

## 2017-10-01 DIAGNOSIS — M25551 Pain in right hip: Secondary | ICD-10-CM

## 2017-10-01 DIAGNOSIS — M25552 Pain in left hip: Secondary | ICD-10-CM

## 2017-10-01 DIAGNOSIS — M545 Low back pain: Secondary | ICD-10-CM

## 2017-10-01 NOTE — Telephone Encounter (Signed)
Pt still waiting for a response about referral. Please advise.  Thanks

## 2017-10-02 ENCOUNTER — Ambulatory Visit: Payer: 59 | Admitting: Psychiatry

## 2017-10-02 ENCOUNTER — Ambulatory Visit: Payer: Self-pay | Admitting: Licensed Clinical Social Worker

## 2017-10-09 ENCOUNTER — Other Ambulatory Visit: Payer: Self-pay

## 2017-10-09 ENCOUNTER — Emergency Department: Payer: Managed Care, Other (non HMO)

## 2017-10-09 ENCOUNTER — Emergency Department
Admission: EM | Admit: 2017-10-09 | Discharge: 2017-10-09 | Disposition: A | Discharge status: Home / Self Care | Payer: Managed Care, Other (non HMO) | Attending: Emergency Medicine | Admitting: Emergency Medicine

## 2017-10-09 ENCOUNTER — Encounter: Payer: Self-pay | Admitting: Emergency Medicine

## 2017-10-09 ENCOUNTER — Ambulatory Visit: Payer: Managed Care, Other (non HMO)

## 2017-10-09 DIAGNOSIS — R1010 Upper abdominal pain, unspecified: Secondary | ICD-10-CM | POA: Diagnosis present

## 2017-10-09 DIAGNOSIS — Z87891 Personal history of nicotine dependence: Secondary | ICD-10-CM | POA: Insufficient documentation

## 2017-10-09 DIAGNOSIS — Z79899 Other long term (current) drug therapy: Secondary | ICD-10-CM | POA: Diagnosis not present

## 2017-10-09 DIAGNOSIS — I2510 Atherosclerotic heart disease of native coronary artery without angina pectoris: Secondary | ICD-10-CM | POA: Insufficient documentation

## 2017-10-09 DIAGNOSIS — Z7982 Long term (current) use of aspirin: Secondary | ICD-10-CM | POA: Insufficient documentation

## 2017-10-09 LAB — COMPREHENSIVE METABOLIC PANEL
ALK PHOS: 111 U/L (ref 38–126)
ALT: 106 U/L — AB (ref 14–54)
AST: 98 U/L — ABNORMAL HIGH (ref 15–41)
Albumin: 4.2 g/dL (ref 3.5–5.0)
Anion gap: 9 (ref 5–15)
BILIRUBIN TOTAL: 0.7 mg/dL (ref 0.3–1.2)
BUN: 15 mg/dL (ref 6–20)
CALCIUM: 9.3 mg/dL (ref 8.9–10.3)
CHLORIDE: 103 mmol/L (ref 101–111)
CO2: 27 mmol/L (ref 22–32)
CREATININE: 1.03 mg/dL — AB (ref 0.44–1.00)
Glucose, Bld: 90 mg/dL (ref 65–99)
Potassium: 4.3 mmol/L (ref 3.5–5.1)
Sodium: 139 mmol/L (ref 135–145)
Total Protein: 8.2 g/dL — ABNORMAL HIGH (ref 6.5–8.1)

## 2017-10-09 LAB — URINALYSIS, COMPLETE (UACMP) WITH MICROSCOPIC
Bacteria, UA: NONE SEEN
Bilirubin Urine: NEGATIVE
GLUCOSE, UA: NEGATIVE mg/dL
HGB URINE DIPSTICK: NEGATIVE
KETONES UR: NEGATIVE mg/dL
LEUKOCYTES UA: NEGATIVE
Nitrite: NEGATIVE
PH: 6 (ref 5.0–8.0)
Protein, ur: NEGATIVE mg/dL
Specific Gravity, Urine: 1.004 — ABNORMAL LOW (ref 1.005–1.030)

## 2017-10-09 LAB — CBC
HCT: 40.2 % (ref 35.0–47.0)
Hemoglobin: 13.4 g/dL (ref 12.0–16.0)
MCH: 30.9 pg (ref 26.0–34.0)
MCHC: 33.3 g/dL (ref 32.0–36.0)
MCV: 92.8 fL (ref 80.0–100.0)
PLATELETS: 238 10*3/uL (ref 150–440)
RBC: 4.33 MIL/uL (ref 3.80–5.20)
RDW: 13.4 % (ref 11.5–14.5)
WBC: 9.3 10*3/uL (ref 3.6–11.0)

## 2017-10-09 LAB — LIPASE, BLOOD: Lipase: 43 U/L (ref 11–51)

## 2017-10-09 MED ORDER — FAMOTIDINE 20 MG PO TABS: 20.0000 mg | ORAL_TABLET | Freq: Two times a day (BID) | ORAL | 0 refills | Status: DC

## 2017-10-09 MED ORDER — ONDANSETRON 4 MG PO TBDP: 4.0000 mg | ORAL_TABLET | Freq: Three times a day (TID) | ORAL | 0 refills | Status: DC | PRN

## 2017-10-09 MED ORDER — ALUMINUM-MAGNESIUM-SIMETHICONE 200-200-20 MG/5ML PO SUSP: 30.0000 mL | Freq: Three times a day (TID) | ORAL | 0 refills | Status: DC

## 2017-10-09 NOTE — ED Notes (Signed)
Patient transported to Ultrasound 

## 2017-10-09 NOTE — ED Provider Notes (Signed)
[facility] Emergency Department Provider Note  ____________________________________________  Time seen: Approximately 3:31 PM  I have reviewed the triage vital signs and the nursing notes.   HISTORY  Chief Complaint Abdominal Pain    HPI Carolyn Esparza is a 52 y.o. female who complains of right upper quadrant pain that started yesterday after eating greasy foods. He has been constant but waxing and waning. Not particularly postprandial. No vomiting or diarrhea but she does have nausea. She also had a nonproductive cough when waking up in the morning after lying supine. Denies fevers or chills, no radiation of the pain. Moderate intensity, aching and burning.     Past Medical History:  Diagnosis Date  . Anxiety   . ASCUS with positive high risk HPV cervical   . CAD (coronary artery disease)   . Chicken pox   . Chronic pain    neck, back, b/l hips   . Depression   . GERD (gastroesophageal reflux disease)   . History of Crohn's disease   . Hyperlipidemia   . Left ovarian cyst    s/p removal of 1 ovary ? which one removed per pt   . Libido, decreased   . Thyroid nodule      Patient Active Problem List   Diagnosis Date Noted  . Abnormal Pap smear of cervix 07/27/2017  . Vasomotor flushing 07/19/2017  . Depression 07/18/2017  . Nausea 06/25/2017  . Hip pain, right 06/25/2017  . OSA (obstructive sleep apnea) 04/18/2017  . Annual physical exam 04/10/2017  . Fatigue 12/16/2016  . Chronic back pain 05/30/2016  . Abdominal pain 03/12/2016  . Anxiety and depression 12/28/2015  . History of obstructive sleep apnea 12/15/2015  . Thyroid nodule 12/15/2015  . GERD (gastroesophageal reflux disease) 12/15/2015  . Hyperlipidemia 12/15/2015  . Pain medication agreement signed 03/25/2015  . CAD in native artery 02/05/2015  . Right supraspinatus tenosynovitis 08/17/2014  . Crohn's disease involving terminal ileum ([place]) 03/30/2014  . FH: colon polyps  03/30/2014  . 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 Other bursitis disorders 07/12/2012  . Pain in joint, pelvic region and thigh 07/12/2012  . Crohn's disease ([place]) 01/23/2012     Past Surgical History:  Procedure Laterality Date  . ABDOMINAL HYSTERECTOMY    . APPENDECTOMY  2004  . BLADDER SURGERY    . BREAST BIOPSY     x 2  . COLONOSCOPY    . OOPHORECTOMY     x1 ? which one removed per pt   . WISDOM TOOTH EXTRACTION       Prior to Admission medications   Medication Sig Start Date End Date Taking? Authorizing Provider  aluminum-magnesium hydroxide-simethicone (MAALOX) [phone] MG/5ML SUSP Take 30 mLs by mouth 4 (four) times daily -  before meals and at bedtime. 10/09/17   Carrie Mew, MD  Ascorbic Acid (VITAMIN C) 1000 MG tablet Take 1,000 mg by mouth daily.     [provider]  aspirin EC 81 MG tablet Take 81 mg by mouth daily.    [provider]  atorvastatin (LIPITOR) 10 MG tablet Take 1 tablet (10 mg total) by mouth daily. 11/14/16   Coral Spikes, DO  buPROPion (WELLBUTRIN XL) 300 MG 24 hr tablet TAKE 1 TABLET BY MOUTH  DAILY 07/23/17   [name], [name], MD  cloNIDine (CATAPRES - DOSED IN MG/24 HR) 0.1 mg/24hr patch Place 1 patch (0.1 mg total) onto the skin once a week. 07/19/17   Gae Dry, MD  DOCOSAHEXAENOIC ACID PO Take by mouth.    [provider]  docusate sodium (DOK) 100 MG capsule TAKE 1 CAPSULE(100 MG) BY MOUTH TWICE DAILY 09/11/16   [provider]  famotidine (PEPCID) 20 MG tablet Take 1 tablet (20 mg total) by mouth 2 (two) times daily. 10/09/17   Carrie Mew, MD  fentaNYL (DURAGESIC - DOSED MCG/HR) 25 MCG/HR patch Place 25 mcg onto the skin every 3 (three) days.     [provider]  HYDROcodone-acetaminophen (NORCO) 10-325 MG tablet Take 1 tablet by mouth every 6 (six) hours as needed.  11/13/15   [provider]  lidocaine (LIDODERM) 5 % Place 2 patches onto the skin daily.      [provider]  Multiple Vitamin (MULTI-VITAMINS) TABS Take 1 tablet by mouth daily.     [provider]  mupirocin nasal ointment (BACTROBAN NASAL) 2 % Bid nose right x 5 days 07/27/17   [name], [name], MD  Omega-3 Fatty Acids (FISH OIL PO) Take 1 capsule by mouth daily.     [provider]  omeprazole (PRILOSEC) 20 MG capsule Take 1 capsule (20 mg total) by mouth daily. In am before food 30 minutes 09/13/17   [name], [name], MD  ondansetron (ZOFRAN ODT) 4 MG disintegrating tablet Take 1 tablet (4 mg total) by mouth every 8 (eight) hours as needed for nausea or vomiting. 10/09/17   Carrie Mew, MD  promethazine (PHENERGAN) 25 MG tablet TAKE 1 TABLET(25 MG) BY MOUTH EVERY 8 HOURS AS NEEDED FOR NAUSEA OR VOMITING 05/28/17   Leone Haven, MD  sertraline (ZOLOFT) 50 MG tablet Take 1 tablet (50 mg total) by mouth daily with breakfast. 08/14/17   [name], [name], MD  traZODone (DESYREL) 50 MG tablet Take 0.5-1 tablets (25-50 mg total) by mouth at bedtime as needed for sleep. 07/18/17   Ursula Alert, MD  Turmeric Curcumin 500 MG CAPS Take by mouth.    [provider]  valACYclovir (VALTREX) 1000 MG tablet  10/15/16   [provider]     Allergies Propofol; Nsaids; and Pentasa [mesalamine]   Family History  Problem Relation Age of Onset  . Alcohol abuse Mother   . Hyperlipidemia Mother   . Heart disease Mother   . Hypertension Mother   . Diabetes Mother   . Crohn's disease Sister        1/2 sister  . Depression Sister   . Leukemia Paternal Grandmother   . Cancer Paternal Aunt        blood, type unknown    Social History Social History   Tobacco Use  . Smoking status: Former Smoker    Types: Cigarettes    Last attempt to quit: 08/29/2000    Years since quitting: 17.1  . Smokeless tobacco: Never Used  Substance Use Topics  . Alcohol use: No    Alcohol/week: 0.0 oz    Comment: social   . Drug use:  No    Review of Systems  Constitutional:   No fever or chills.  ENT:   No sore throat. No rhinorrhea. Cardiovascular:   No chest pain or syncope. Respiratory:   No dyspnea or cough. Gastrointestinal:  Positive as above for abdominal pain without vomiting or diarrhea  Musculoskeletal:   Negative for focal pain or swelling All other systems reviewed and are negative except as documented above in ROS and HPI.  ____________________________________________   PHYSICAL EXAM:  VITAL SIGNS: ED Triage Vitals  Enc Vitals Group  BP 10/09/17 1203 128/67     Pulse Rate 10/09/17 1203 73     Resp 10/09/17 1203 18     Temp 10/09/17 1203 98.6 F (37 C)     Temp Source 10/09/17 1203 Oral     SpO2 10/09/17 1203 98 %     Weight 10/09/17 1203 172 lb (78 kg)     Height 10/09/17 1203 5\' 3"  (1.6 m)     Head Circumference --      Peak Flow --      Pain Score 10/09/17 1207 6     Pain Loc --      Pain Edu? --      Excl. in [place]? --     Vital signs reviewed, nursing assessments reviewed.   Constitutional:   Alert and oriented. Well appearing and in no distress. Eyes:   No scleral icterus.  EOMI. No nystagmus. No conjunctival pallor. PERRL. ENT   Head:   Normocephalic and atraumatic.   Nose:   No congestion/rhinnorhea.    Mouth/Throat:   MMM, no pharyngeal erythema. No peritonsillar mass.    Neck:   No meningismus. Full ROM. Hematological/Lymphatic/Immunilogical:   No cervical lymphadenopathy. Cardiovascular:   RRR. Symmetric bilateral radial and DP pulses.  No murmurs.  Respiratory:   Normal respiratory effort without tachypnea/retractions. Breath sounds are clear and equal bilaterally. No wheezes/rales/rhonchi. Gastrointestinal:   Soft with right upper quadrant tenderness. Negative Murphy sign.. Non distended. There is no CVA tenderness.  No rebound, rigidity, or guarding. Genitourinary:   deferred Musculoskeletal:   Normal range of motion in all extremities. No joint effusions.   No lower extremity tenderness.  No edema. Neurologic:   Normal speech and language.  Motor grossly intact. No acute focal neurologic deficits are appreciated.  Skin:    Skin is warm, dry and intact. No rash noted.  No petechiae, purpura, or bullae.  ____________________________________________    LABS (pertinent positives/negatives) (all labs ordered are listed, but only abnormal results are displayed) Labs Reviewed  COMPREHENSIVE METABOLIC PANEL - Abnormal; Notable for the following components:      Result Value   Creatinine, Ser 1.03 (*)    Total Protein 8.2 (*)    AST 98 (*)    ALT 106 (*)    All other components within normal limits  URINALYSIS, COMPLETE (UACMP) WITH MICROSCOPIC - Abnormal; Notable for the following components:   Color, Urine STRAW (*)    APPearance CLEAR (*)    Specific Gravity, Urine 1.004 (*)    Squamous Epithelial / LPF 0-5 (*)    All other components within normal limits  LIPASE, BLOOD  CBC   ____________________________________________   EKG    ____________________________________________    RADIOLOGY  US Abdomen Limited Ruq  Result Date: 10/09/2017 CLINICAL DATA:  Right upper quadrant pain and nausea EXAM: ULTRASOUND ABDOMEN LIMITED RIGHT UPPER QUADRANT COMPARISON:  CT abdomen and pelvis March 02, 2016 FINDINGS: Gallbladder: There is mild mobile sludge in the gallbladder. No gallstones or wall thickening visualized. No pericholecystic fluid. No sonographic Murphy sign noted by sonographer. Common bile duct: Diameter: 3 mm. No intrahepatic or extrahepatic biliary duct dilatation. Liver: There is an echogenic lesion in the posterior segment of the right lobe of the liver near the hepatorenal fossa measuring 2.2 x 3.4 x 2.8 cm. There is a hypoechoic mass in the anterior segments of the right lobe of the liver near the junction with the left lobe measuring 1.1 x 0.8 x 0.9 cm. There is  a hypoechoic mass in the medial segment of the left lobe of the  liver measuring 1.1 x 0.8 x 1.1 cm. Within normal limits in parenchymal echogenicity. Portal vein is patent on color Doppler imaging with normal direction of blood flow towards the liver. IMPRESSION: 1. Mild sludge in gallbladder. Gallbladder otherwise appears unremarkable. 2. There are 3 focal liver lesions evident. There is a hyperechoic lesion near the hepatorenal fossa which has an appearance typical of hemangioma. Smaller lesions elsewhere are hypoechoic and do not have classic hemangioma appearance. Given this circumstance, a follow-up ultrasound of the liver in approximately 1 year would be advised to further evaluate. If more aggressive evaluation of the liver is felt to be warranted, CT or MR of the liver pre and serial post contrast would be advisable in this regard. The studies could be performed nonemergently on an outpatient basis. Electronically Signed   By: Lowella Grip III M.D.   On: 10/09/2017 15:44    ____________________________________________   PROCEDURES Procedures  ____________________________________________    CLINICAL IMPRESSION / ASSESSMENT AND PLAN / ED COURSE  Pertinent labs & imaging results that were available during my care of the patient were reviewed by me and considered in my medical decision making (see chart for details).   Patient well-appearing no acute distress, normal vital signs, presents with right upper quadrant tenderness in the setting of postprandial pain. Concern for cholelithiasis versus cholecystitis. Labs reveal a slight elevation in transaminases. I will obtain an ultrasound of the right upper quadrant for further evaluation. If there is no evidence of cholecystitis, patient will be suitable for discharge home with symptomatic management of biliary colic for follow-up with surgery clinic.   ----------------------------------------- 4:48 PM on 10/09/2017 -----------------------------------------  Ultrasound negative except for slight  biliary sludge. No gallstones, no evidence of cholecystitis. Labs are unremarkable. No evidence of pancreatitis, normal white blood cell count. Suitable for discharge home. At this point, most likely cause of her symptoms is GERD and gastritis. Her do antiacids and Zofran for symptom control, follow up with primary care.Considering the patient's symptoms, medical history, and physical examination today, I have low suspicion for cholecystitis or biliary pathology, pancreatitis, perforation or bowel obstruction, hernia, intra-abdominal abscess, AAA or dissection, volvulus or intussusception, mesenteric ischemia, or appendicitis.  Low suspicion for pneumonia and pneumothorax or PE, especially with her normal vital signs.     ____________________________________________   FINAL CLINICAL IMPRESSION(S) / ED DIAGNOSES    Final diagnoses:  Pain of upper abdomen     ED Discharge Orders        Ordered    aluminum-magnesium hydroxide-simethicone (MAALOX) [phone] MG/5ML SUSP  3 times daily before meals & bedtime     10/09/17 1646    famotidine (PEPCID) 20 MG tablet  2 times daily     10/09/17 1646    ondansetron (ZOFRAN ODT) 4 MG disintegrating tablet  Every 8 hours PRN     10/09/17 1646      Portions of this note were generated with dragon dictation software. Dictation errors may occur despite best attempts at proofreading.    Carrie Mew, MD 10/09/17 [phone]

## 2017-10-09 NOTE — ED Notes (Signed)
Lab results reviewed. Awaiting room for MD eval.  

## 2017-10-09 NOTE — Discharge Instructions (Signed)
Her labs today were unremarkable, and your ultrasound of the gallbladder did not show any gallstones. Your pain may be due to gastritis, which can be improved with antacid medication. Follow up with your doctor within 1 week for continued monitoring of your symptoms.

## 2017-10-09 NOTE — ED Triage Notes (Signed)
Pt in via POV with complaints of RUQ abdominal pain x 2 days, reports nausea, denies any vomiting, diarrhea.  Vitals WDL, NAD noted at this time.

## 2017-10-09 NOTE — ED Triage Notes (Addendum)
First nurse note: Patient brought by [facility] to ER for c/o RUQ that began yesterday after eating greasy foods. Patient in no acute distress at this time.

## 2017-10-10 NOTE — Telephone Encounter (Signed)
Pt has an appt on 10/15/17 with PT.

## 2017-10-15 ENCOUNTER — Ambulatory Visit: Payer: Managed Care, Other (non HMO) | Attending: Internal Medicine

## 2017-10-15 DIAGNOSIS — G8929 Other chronic pain: Secondary | ICD-10-CM | POA: Insufficient documentation

## 2017-10-15 DIAGNOSIS — M25552 Pain in left hip: Secondary | ICD-10-CM | POA: Diagnosis present

## 2017-10-15 DIAGNOSIS — M25551 Pain in right hip: Secondary | ICD-10-CM | POA: Insufficient documentation

## 2017-10-15 DIAGNOSIS — M542 Cervicalgia: Secondary | ICD-10-CM | POA: Insufficient documentation

## 2017-10-15 DIAGNOSIS — M545 Low back pain: Secondary | ICD-10-CM | POA: Insufficient documentation

## 2017-10-15 DIAGNOSIS — M6281 Muscle weakness (generalized): Secondary | ICD-10-CM | POA: Insufficient documentation

## 2017-10-15 NOTE — Therapy (Signed)
[facility] [address], [place], [address] Phone: [phone]   Fax:  [phone]  Physical Therapy Evaluation  Patient Details  Name: Carolyn Esparza MRN: [id number] Date of Birth: May 22, 1966 Referring Provider: Orland Mustard, MD   Encounter Date: 10/15/2017  PT End of Session - 10/15/17 0931    Visit Number  1    Number of Visits  17    Date for PT Re-Evaluation  12/13/17    PT Start Time  0932    PT Stop Time  1038    PT Time Calculation (min)  66 min    Activity Tolerance  Patient tolerated treatment well    Behavior During Therapy  [facility] for tasks assessed/performed       Past Medical History:  Diagnosis Date  . Anxiety   . ASCUS with positive high risk HPV cervical   . CAD (coronary artery disease)   . Chicken pox   . Chronic pain    neck, back, b/l hips   . Depression   . GERD (gastroesophageal reflux disease)   . History of Crohn's disease   . Hyperlipidemia   . Left ovarian cyst    s/p removal of 1 ovary ? which one removed per pt   . Libido, decreased   . Thyroid nodule     Past Surgical History:  Procedure Laterality Date  . ABDOMINAL HYSTERECTOMY    . APPENDECTOMY  2004  . BLADDER SURGERY    . BREAST BIOPSY     x 2  . COLONOSCOPY    . OOPHORECTOMY     x1 ? which one removed per pt   . WISDOM TOOTH EXTRACTION      There were no vitals filed for this visit.   Subjective Assessment - 10/15/17 0936    Subjective  R hip pain: 5/10 currently (pt sitting), 4/10 at best for the past 2 months, 8/10 at worst after being in bed all night.  Neck: 6/10 currently, 4/10 at best, 8/10 at worst (for the past 2 months).   Back pain: 3/10 currently (pt sitting, also had steroid injection February 2019), and at best; 8/10 at worst for the past 2 months.     Pertinent History  Had facet shots in her low back last month (steriod) which pt states that it put a band aid on it. Had back  problems for about 15 years. Currenlty has a difficult time standing up from sitting. Also has neck pain since 3-4 months ago, gradual onset. 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  Pt sits in front of her computer all day. Adjusted computer monitor to eye level. Feels like stress and use of the phone (Iphone 8, uses her R ear predominantly but now uses an ear pod on L ear after having neck pain which helps with the talking on the phone) might play a factor.  Tried neck stretches such as cervical flexion, UT stretch and does not know if they help.  Pt states having L hand numbness when using her computer mouse. 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  When L hand gets numb she feels cramping. Has to shake her L hand to get the feeling back (possible ulnar nerve distribution but pt unsure).  Neck and both hips bother her  more currently since she had shots for her back recently. R hip pain bothers her more. Was told that she has bursitis. Had shots in her R hip years ago which temporarily helped. Now has a hard time laying on her R hip when sleeping. R hip pain runs laterally down her thigh to just proximal to her knee (along the ITband/L5 distribution). Has been using lidocaine patches on her hip from time to time.  The shots she had in her back helped with her hip pain temporarily.   Neck and hips bothered her the most even before her low back shots.   R hip bothers her more than her neck.  Sits on a chair with a lumbar support.   Pt states back pain does not go down her legs but it goes around her torso (underneath belly button) when it gets real bad.  R low back pain > L.     Patient Stated Goals  Not to hurt. To be able to not to be able to not worry about how long she is standing, not to worry about her hip hurting while she is sleeping, for her neck not to  hurt when she looks around.     Currently in Pain?  Yes    Pain Score  6  6/10 neck, 5/10 R hip pain    Pain Location  -- neck, back R and L hip    Pain Orientation  Right;Left;Posterior;Lower    Pain Type  Chronic pain    Pain Onset  More than a month ago    Pain Frequency  Constant    Aggravating Factors   R hip: laying on her R side; neck: stressed out at work (shoulder shrug while working on the computer); Back: standing 10 minutes tolerance before her shot,     Pain Relieving Factors  R hip: standing up and walking around; standing up and typing (and not shrugging her shoulders and not looking down);          [facility] PT Assessment - 10/15/17 1000      Assessment   Medical Diagnosis  Cervicalgia, B LBP, unspecified chronicity with sciatica presence unspecified, pain of both hip joints.     Referring Provider  Orland Mustard, MD    Onset Date/Surgical Date  10/01/17 Date PT referral signed. Chronic condition.    Prior Therapy  Pt had prior PT for her forearms several years ago with good success.       Precautions   Precaution Comments  No known precautions      Restrictions   Other Position/Activity Restrictions  no known weight bearing restrictions.       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 (-) Ober's test both knee straight and bent R hip    Focus on Therapeutic Outcomes (FOTO)   65 (neck)    Neck Disability Index   30%      Posture/Postural Control   Posture Comments  R shoulder more protracted compared to L, protracted neck, slight L side bend lower cervical spine, movement preference around C6/C7, R greater trochanter slightly higher, slight L lateral shift      AROM   Overall AROM Comments  No pain with cervical rotation PROM in supine.     Cervical Flexion  [facility]  with  bilateral UT pulling (same area as neck pain)    Cervical Extension  WFL, no pain with overpressure     Cervical - Right Side [facility] with L upper trap pulling, R lateral upper cervical spine pulling on the return to neutral    Cervical - Left Side Bend  slightly limited with R upper trap tightness     Cervical - Right Rotation  WFL with R lateral neck pain on the return motion    Cervical - Left Rotation  WFL with L posterior lateral neck tightness    Lumbar Flexion  full reproducted B hip tightness with repeated flex test at end    Lumbar Extension  WFL, no bilateral hip tightness    Lumbar - Right Side [facility] with R low back pain and R lateral hip tightness    Lumbar - Left Side [facility] with R lateral hip twinge (bothers her more than R side bending)     Lumbar - Right Rotation  WFL  seated, no pain with overpressure    Lumbar - Left Rotation  WFL  seated, no pain with overpressure      Strength   Right Hip Extension  4-/5    Right Hip ABduction  4/5 with R lateral hip discomfort    Left Hip Extension  4-/5    Left Hip ABduction  4+/5      Palpation   Palpation comment  R upper trap tension. TTP R greater trochanter             Objective measurements completed on examination: See above findings.  R hand dominant.  Pt states lying flat on her back does not bother her unless she stood greater than 10-15 minutes   Ther-ex seated L hip extension isometrics 10x5 seconds for 2 sets.   Improved exercise technique, movement at target joints, use of target muscles after mod verbal, visual, tactile cues.   Pt states no R lateral hip pain after session. Just tiredness.    Patient is a 52 year old female who came to physical therapy secondary to bilateral hip (R > L), neck, and low back pain. She also presents with poor posture, reproduction of neck pain with cervical AROM, reproduction of low back and R hip pain with lumbar AROM, bilateral hip weakness, TTP, (+) special test  suggesting lumbopelvic involvement, and difficulty performing functional tasks such as working in front of her computer and difficulty tolerating positions such as standing for greater than 10 minutes and lying on her R side. Patient will benefit from continued skilled physical therapy services to address the aforementioned deficits.       PT Education - 10/15/17 1832    Education provided  Yes    Education Details  ther-ex, HEP, plan of care    Person(s) Educated  Patient    Methods  Explanation;Demonstration;Tactile cues;Handout;Verbal cues    Comprehension  Returned demonstration;Verbalized understanding          PT Long Term Goals - 10/15/17 1840      PT LONG TERM GOAL #1   Title  Patient will have a decrease in R hip pain to 4/10 or less at worst to promote ability to perform funcitonal tasks, tolerate R S/L position.     Baseline  8/10 R hip pain at worst for the past 2 months (10/15/2017)    Time  8    Period  Weeks    Status  New  Target Date  12/13/17      PT LONG TERM GOAL #2   Title  Patient will have a decrease in neck pain to 4/10 or less at worst to promote ability to look around as well as work at her desk with less pain.     Baseline  8/10 at worst (10/15/2017)    Time  8    Period  Weeks    Status  New    Target Date  12/13/17      PT LONG TERM GOAL #3   Title  Patient will have a decrease in low back pain to 4/10 or less at worst to promote ability to tolerate standing for longer periods of time.     Baseline  8/10 back pain at worst (10/15/2017)    Time  8    Period  Weeks    Status  New    Target Date  12/13/17      PT LONG TERM GOAL #4   Title  Pt will improve her neck FOTO score by at least 8 points as a demonstration of improved function.     Baseline  65 (neck, 10/15/2017)    Time  8    Period  Weeks    Status  New    Target Date  12/13/17      PT LONG TERM GOAL #5   Title  Pt will improve her NDI score by at least 12% as a demonstration of  improved function.     Baseline  30% (10/15/2017)    Time  8    Period  Weeks    Status  New    Target Date  12/13/17             Plan - 10/15/17 1254    Clinical Impression Statement  Patient is a 52 year old female who came to physical therapy secondary to bilateral hip (R > L), neck, and low back pain. She also presents with poor posture, reproduction of neck pain with cervical AROM, reproduction of low back and R hip pain with lumbar AROM, bilateral hip weakness, TTP, positive special test suggesting lumbopelvic involvement, and difficulty performing functional tasks such as working in front of her computer and difficulty tolerating positions such as standing for greater than 10 minutes and lying on her R side. Patient will benefit from continued skilled physical therapy services to address the aforementioned deficits.     History and Personal Factors relevant to plan of care:  Chronicity of condition, multiple areas of pain, difficulty tolerating positions such as prolonged standing or R S/L     Clinical Presentation  Stable    Clinical Presentation due to:  steroid shot helped with low back pain    Clinical Decision Making  Low    Rehab Potential  Fair    Clinical Impairments Affecting Rehab Potential  Chronicity of condition, multiple areas of pain, difficulty tolerating positions such as prolonged standing or R S/L     PT Frequency  2x / week    PT Duration  8 weeks    PT Treatment/Interventions  Neuromuscular re-education;Therapeutic exercise;Therapeutic activities;Manual techniques;Aquatic Therapy;Electrical Stimulation;Iontophoresis 4mg /ml Dexamethasone;Traction;Ultrasound;Patient/family education;Dry needling    PT Next Visit Plan  glute med and max strengthening, scapular strengthening, gentle extension, trunk strengthening, anterior cervical strengthening, posture, manual techniques, modalities PRN    Consulted and Agree with Plan of Care  Patient       Patient will  benefit from skilled therapeutic intervention in  order to improve the following deficits and impairments:  Pain, Postural dysfunction, Improper body mechanics, Decreased strength  Visit Diagnosis: Pain in right hip - Plan: PT plan of care cert/re-cert  Pain in left hip - Plan: PT plan of care cert/re-cert  Cervicalgia - Plan: PT plan of care cert/re-cert  Chronic bilateral low back pain, with sciatica presence unspecified - Plan: PT plan of care cert/re-cert  Muscle weakness (generalized) - Plan: PT plan of care cert/re-cert     Problem List Patient Active Problem List   Diagnosis Date Noted  . Abnormal Pap smear of cervix 07/27/2017  . Vasomotor flushing 07/19/2017  . Depression 07/18/2017  . Nausea 06/25/2017  . Hip pain, right 06/25/2017  . OSA (obstructive sleep apnea) 04/18/2017  . Annual physical exam 04/10/2017  . Fatigue 12/16/2016  . Chronic back pain 05/30/2016  . Abdominal pain 03/12/2016  . Anxiety and depression 12/28/2015  . History of obstructive sleep apnea 12/15/2015  . Thyroid nodule 12/15/2015  . GERD (gastroesophageal reflux disease) 12/15/2015  . Hyperlipidemia 12/15/2015  . Pain medication agreement signed 03/25/2015  . CAD in native artery 02/05/2015  . Right supraspinatus tenosynovitis 08/17/2014  . Crohn's disease involving terminal ileum ([place]) 03/30/2014  . FH: colon polyps 03/30/2014  . 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 Other bursitis disorders 07/12/2012  . Pain in joint, pelvic region and thigh 07/12/2012  . Crohn's disease ([place]) 01/23/2012    Joneen Boers PT, DPT   10/15/2017, 6:52 PM  [facility] [address] [place], [address] Phone: [phone]   Fax:  [phone]  Name: Carolyn Esparza MRN: [id number] Date of Birth: 25-Jun-1966

## 2017-10-15 NOTE — Patient Instructions (Signed)
Seated hip extension isometrics.   Sitting on a chair, squeeze your rear end muscle together and press your left foot onto the floor  Hold for 5 seconds  Repeat 10 times  Perform 3 sets daily.   Can stop exercise if pain resolves.

## 2017-10-18 ENCOUNTER — Ambulatory Visit: Payer: Managed Care, Other (non HMO)

## 2017-10-18 DIAGNOSIS — M25551 Pain in right hip: Secondary | ICD-10-CM

## 2017-10-18 DIAGNOSIS — M542 Cervicalgia: Secondary | ICD-10-CM

## 2017-10-18 DIAGNOSIS — M545 Low back pain: Secondary | ICD-10-CM

## 2017-10-18 DIAGNOSIS — G8929 Other chronic pain: Secondary | ICD-10-CM

## 2017-10-18 DIAGNOSIS — M6281 Muscle weakness (generalized): Secondary | ICD-10-CM

## 2017-10-18 NOTE — Patient Instructions (Addendum)
   Sitting on your bed (hips less than 90 degrees flexion)   Belt around thighs, which are shoulder width apart,    Press knees out against belt with 40 % effort for 1 minute   Rest for 1-2 minutes   Perform 5 times per set.     Do 3 sets per day. Each set to be performed a few hours apart.     Upper Cervical Flexion / Extension  Do not follow first picture  Give yourself a "double chin." Hold ___5_ seconds. Repeat _10___ times per set. Do 3____ sets per session. Do ___1_ sessions per day.  http://orth.exer.us/351   Copyright  VHI. All rights reserved.

## 2017-10-18 NOTE — Therapy (Signed)
[facility] [address], [place], [address] Phone: [phone]   Fax:  [phone]  Physical Therapy Treatment  Patient Details  Name: Carolyn Esparza MRN: [id number] Date of Birth: Aug 09, 1965 Referring Provider: Orland Mustard, MD   Encounter Date: 10/18/2017  PT End of Session - 10/18/17 0955    Visit Number  2    Number of Visits  17    Date for PT Re-Evaluation  12/13/17    PT Start Time  0955    PT Stop Time  1037    PT Time Calculation (min)  42 min    Activity Tolerance  Patient tolerated treatment well    Behavior During Therapy  [facility] for tasks assessed/performed       Past Medical History:  Diagnosis Date  . Anxiety   . ASCUS with positive high risk HPV cervical   . CAD (coronary artery disease)   . Chicken pox   . Chronic pain    neck, back, b/l hips   . Depression   . GERD (gastroesophageal reflux disease)   . History of Crohn's disease   . Hyperlipidemia   . Left ovarian cyst    s/p removal of 1 ovary ? which one removed per pt   . Libido, decreased   . Thyroid nodule     Past Surgical History:  Procedure Laterality Date  . ABDOMINAL HYSTERECTOMY    . APPENDECTOMY  2004  . BLADDER SURGERY    . BREAST BIOPSY     x 2  . COLONOSCOPY    . OOPHORECTOMY     x1 ? which one removed per pt   . WISDOM TOOTH EXTRACTION      There were no vitals filed for this visit.  Subjective Assessment - 10/18/17 0956    Subjective  R hip is not a whole lot of difference. Did her HEP yesterday. R hip is 5/10 currently 6/10 neck pain currenty (has been bothering her more within the last couple of days).  Back is bothering her more this morning. Feels like the shots are starting to wear off now, 4/10 currently.     Pertinent History  Had facet shots in her low back last month (steriod) which pt states that it put a band aid on it. Had back problems for about 15 years. Currenlty has a difficult  time standing up from sitting. Also has neck pain since 3-4 months ago, gradual onset. 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  Pt sits in front of her computer all day. Adjusted computer monitor to eye level. Feels like stress and use of the phone (Iphone 8, uses her R ear predominantly but now uses an ear pod on L ear after having neck pain which helps with the talking on the phone) might play a factor.  Tried neck stretches such as cervical flexion, UT stretch and does not know if they help.  Pt states having L hand numbness when using her computer mouse. 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  When L hand gets numb she feels cramping. Has to shake her L hand to get the feeling back (possible ulnar nerve distribution but pt unsure).  Neck and both hips bother her more currently since she had shots for her back recently. R hip  pain bothers her more. Was told that she has bursitis. Had shots in her R hip years ago which temporarily helped. Now has a hard time laying on her R hip when sleeping. R hip pain runs laterally down her thigh to just proximal to her knee (along the ITband/L5 distribution). Has been using lidocaine patches on her hip from time to time.  The shots she had in her back helped with her hip pain temporarily.   Neck and hips bothered her the most even before her low back shots.   R hip bothers her more than her neck.  Sits on a chair with a lumbar support.   Pt states back pain does not go down her legs but it goes around her torso (underneath belly button) when it gets real bad.  R low back pain > L.     Patient Stated Goals  Not to hurt. To be able to not to be able to not worry about how long she is standing, not to worry about her hip hurting while she is sleeping, for her neck not to hurt when she looks around.     Currently in Pain?  Yes     Pain Score  6     Pain Onset  More than a month ago                              PT Education - 10/18/17 1001    Education provided  Yes    Education Details  ther-ex, HEP    Person(s) Educated  Patient    Methods  Explanation;Demonstration;Tactile cues;Verbal cues;Handout    Comprehension  Returned demonstration;Verbalized understanding      Objectives   Ther-ex  seated L hip extension isometrics 10x5 seconds for 3 sets.   Sitting with lumbar towel roll   Seated clamshell isometrics 40 % effort 1 min with 1 min rest breaks x 5   Pt was recommended to have even weight distribution on each foot when standing to not place pressure to R lateral hip, femoral control with sit <> stand and stair negotation, and use of eggshell crate type mattress topper to decrease pressure on R lateral hip when sleeping. Pt verbalized understanding.   Ascending and descending 4 regular steps  Video cues for femoral control  foreward step up onto 3 inch step with R LE with L UE assist 10x, cues for femoral control and decrease L pelvic drop.   Standing L hip hike to promote  R glute med strengthening 10x5 second holds. Slight discomfort afterwards.   Chin tucks to help decrease neck pain.   Reviewed HEP. Pt demonstrated and verbalized understanding.    Improved exercise technique, movement at target joints, use of target muscles after mod verbal, visual, tactile cues.    Worked on isometric glute med strengthening and pelvic control to help decrease R lateral hip pain. Added chin tucks to promote more neutral neck posture to help decrease neck pain.     PT Long Term Goals - 10/15/17 1840      PT LONG TERM GOAL #1   Title  Patient will have a decrease in R hip pain to 4/10 or less at worst to promote ability to perform funcitonal tasks, tolerate R S/L position.     Baseline  8/10 R hip pain at worst for the past 2 months (10/15/2017)    Time  8    Period  Weeks  Status  New    Target Date  12/13/17      PT LONG TERM GOAL #2   Title  Patient will have a decrease in neck pain to 4/10 or less at worst to promote ability to look around as well as work at her desk with less pain.     Baseline  8/10 at worst (10/15/2017)    Time  8    Period  Weeks    Status  New    Target Date  12/13/17      PT LONG TERM GOAL #3   Title  Patient will have a decrease in low back pain to 4/10 or less at worst to promote ability to tolerate standing for longer periods of time.     Baseline  8/10 back pain at worst (10/15/2017)    Time  8    Period  Weeks    Status  New    Target Date  12/13/17      PT LONG TERM GOAL #4   Title  Pt will improve her neck FOTO score by at least 8 points as a demonstration of improved function.     Baseline  65 (neck, 10/15/2017)    Time  8    Period  Weeks    Status  New    Target Date  12/13/17      PT LONG TERM GOAL #5   Title  Pt will improve her NDI score by at least 12% as a demonstration of improved function.     Baseline  30% (10/15/2017)    Time  8    Period  Weeks    Status  New    Target Date  12/13/17            Plan - 10/18/17 1001    Clinical Impression Statement  Worked on isometric glute med strengthening and pelvic control to help decrease R lateral hip pain. Added chin tucks to promote more neutral neck posture to help decrease neck pain.     Rehab Potential  Fair    Clinical Impairments Affecting Rehab Potential  Chronicity of condition, multiple areas of pain, difficulty tolerating positions such as prolonged standing or R S/L     PT Frequency  2x / week    PT Duration  8 weeks    PT Treatment/Interventions  Neuromuscular re-education;Therapeutic exercise;Therapeutic activities;Manual techniques;Aquatic Therapy;Electrical Stimulation;Iontophoresis 4mg /ml Dexamethasone;Traction;Ultrasound;Patient/family education;Dry needling    PT Next Visit Plan  glute med and max strengthening, scapular strengthening,  gentle extension, trunk strengthening, anterior cervical strengthening, posture, manual techniques, modalities PRN    Consulted and Agree with Plan of Care  Patient       Patient will benefit from skilled therapeutic intervention in order to improve the following deficits and impairments:  Pain, Postural dysfunction, Improper body mechanics, Decreased strength  Visit Diagnosis: Pain in right hip  Cervicalgia  Chronic bilateral low back pain, with sciatica presence unspecified  Muscle weakness (generalized)     Problem List Patient Active Problem List   Diagnosis Date Noted  . Abnormal Pap smear of cervix 07/27/2017  . Vasomotor flushing 07/19/2017  . Depression 07/18/2017  . Nausea 06/25/2017  . Hip pain, right 06/25/2017  . OSA (obstructive sleep apnea) 04/18/2017  . Annual physical exam 04/10/2017  . Fatigue 12/16/2016  . Chronic back pain 05/30/2016  . Abdominal pain 03/12/2016  . Anxiety and depression 12/28/2015  . History of obstructive sleep apnea 12/15/2015  . Thyroid nodule 12/15/2015  .  GERD (gastroesophageal reflux disease) 12/15/2015  . Hyperlipidemia 12/15/2015  . Pain medication agreement signed 03/25/2015  . CAD in native artery 02/05/2015  . Right supraspinatus tenosynovitis 08/17/2014  . Crohn's disease involving terminal ileum ([place]) 03/30/2014  . FH: colon polyps 03/30/2014  . 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 Other bursitis disorders 07/12/2012  . Pain in joint, pelvic region and thigh 07/12/2012  . Crohn's disease ([place]) 01/23/2012    Joneen Boers PT, DPT   10/18/2017, 7:18 PM  [facility] [address] [place], [address] Phone: [phone]   Fax:  [phone]  Name: Carolyn Esparza MRN: [id number] Date of Birth: 08-Oct-1965

## 2017-10-19 ENCOUNTER — Telehealth: Payer: Self-pay

## 2017-10-19 ENCOUNTER — Ambulatory Visit: Payer: Managed Care, Other (non HMO) | Admitting: Internal Medicine

## 2017-10-19 NOTE — Telephone Encounter (Signed)
Patient's call was transferred to [place] ( referral). Copied from [place] [phone]. Topic: General - Other >> Oct 19, 2017  3:58 PM Carolyn Stare wrote:  Pt returning [place] call and would like a call back

## 2017-10-22 ENCOUNTER — Ambulatory Visit: Payer: Managed Care, Other (non HMO)

## 2017-10-22 DIAGNOSIS — M545 Low back pain: Secondary | ICD-10-CM

## 2017-10-22 DIAGNOSIS — M25551 Pain in right hip: Secondary | ICD-10-CM

## 2017-10-22 DIAGNOSIS — G8929 Other chronic pain: Secondary | ICD-10-CM

## 2017-10-22 DIAGNOSIS — M542 Cervicalgia: Secondary | ICD-10-CM

## 2017-10-22 DIAGNOSIS — M6281 Muscle weakness (generalized): Secondary | ICD-10-CM

## 2017-10-22 NOTE — Patient Instructions (Signed)
Scapular Retraction: Rowing (Eccentric) - Arms - 45 Degrees (Resistance Band)      Hold end of band in each hand. Squeeze shoulder blades together and tip shoulder blades back.   Hold for 5 seconds.    Use ____red____ resistance band. _10__ reps per set, _3__ sets per day. Copyright  [employer]. All rights reserved.

## 2017-10-22 NOTE — Therapy (Signed)
[facility] [address], [place], [address] Phone: [phone]   Fax:  [phone]  Physical Therapy Treatment  Patient Details  Name: Carolyn Esparza MRN: [id number] Date of Birth: 1966/07/12 Referring Provider: Orland Mustard, MD   Encounter Date: 10/22/2017  PT End of Session - 10/22/17 0950    Visit Number  3    Number of Visits  17    Date for PT Re-Evaluation  12/13/17    PT Start Time  0951    PT Stop Time  1041    PT Time Calculation (min)  50 min    Activity Tolerance  Patient tolerated treatment well    Behavior During Therapy  [facility] for tasks assessed/performed       Past Medical History:  Diagnosis Date  . Anxiety   . ASCUS with positive high risk HPV cervical   . CAD (coronary artery disease)   . Chicken pox   . Chronic pain    neck, back, b/l hips   . Depression   . GERD (gastroesophageal reflux disease)   . History of Crohn's disease   . Hyperlipidemia   . Left ovarian cyst    s/p removal of 1 ovary ? which one removed per pt   . Libido, decreased   . Thyroid nodule     Past Surgical History:  Procedure Laterality Date  . ABDOMINAL HYSTERECTOMY    . APPENDECTOMY  2004  . BLADDER SURGERY    . BREAST BIOPSY     x 2  . COLONOSCOPY    . OOPHORECTOMY     x1 ? which one removed per pt   . WISDOM TOOTH EXTRACTION      There were no vitals filed for this visit.  Subjective Assessment - 10/22/17 0952    Subjective  Was not able to do her neck HEP. Bothered her neck and got a headache. No headache currently. Did the hip (isometric) exercises and they were ok.   Neck feels ok at rest but bothers her when she turns her neck (R rotation pain > L rotation).  Also wonders if the pillow she is using makes a difference. Currently uses a flat king size pillow instead of her fluffier queen size pillow. Currently sleeps on her L side due to R hip pain. Also uses a sleep number bed.   5-6/10 neck pain with R cervical rotation.  R hip pain 6/10 currently (pt sitting on chair).  3-4/10 back pain currently. Pt states the shot she got for her back helped but might be wearing off.  Also put a heating pad on her back which helped.     Pertinent History  Had facet shots in her low back last month (steriod) which pt states that it put a band aid on it. Had back problems for about 15 years. Currenlty has a difficult time standing up from sitting. Also has neck pain since 3-4 months ago, gradual onset. 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  Pt sits in front of her computer all day. Adjusted computer monitor to eye level. Feels like stress and use of the phone (Iphone 8, uses her R ear predominantly but now uses an ear pod on L ear after having neck pain which helps with the talking on the phone) might play a factor.  Tried neck stretches such as cervical flexion, UT stretch and does not know if they  help.  Pt states having L hand numbness when using her computer mouse. 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  When L hand gets numb she feels cramping. Has to shake her L hand to get the feeling back (possible ulnar nerve distribution but pt unsure).  Neck and both hips bother her more currently since she had shots for her back recently. R hip pain bothers her more. Was told that she has bursitis. Had shots in her R hip years ago which temporarily helped. Now has a hard time laying on her R hip when sleeping. R hip pain runs laterally down her thigh to just proximal to her knee (along the ITband/L5 distribution). Has been using lidocaine patches on her hip from time to time.  The shots she had in her back helped with her hip pain temporarily.   Neck and hips bothered her the most even before her low back shots.   R hip bothers her more than her neck.  Sits  on a chair with a lumbar support.   Pt states back pain does not go down her legs but it goes around her torso (underneath belly button) when it gets real bad.  R low back pain > L.     Patient Stated Goals  Not to hurt. To be able to not to be able to not worry about how long she is standing, not to worry about her hip hurting while she is sleeping, for her neck not to hurt when she looks around.     Currently in Pain?  Yes    Pain Score  6     Pain Onset  More than a month ago                No data recorded               PT Education - 10/22/17 1020    Education provided  Yes    Education Details  ther-ex, HEP    Person(s) Educated  Patient    Methods  Explanation;Demonstration;Tactile cues;Verbal cues;Handout    Comprehension  Returned demonstration;Verbalized understanding        Objectives   Manual therapy   Seated STM R posterior cervical paraspinal muscle. Decreased pain with R cervical rotation.   Seated STM R upper trap muscle. Decreased pain with R cervical rotation.        Ther-ex  Pt was recommended to hold off on her chin tuck HEP. Pt verbalized understanding.   Pt was recommended to return to her Queen sized fluffier pillow and see if that makes a difference.   Seated manually resisted scapular retraction targeting lower trap muscle. 10x5 seconds for 3 sets. Decreased pain with R and L cervical rotation.   Standing bilateral scapular retraction resisting red band 10x5 seconds for 3 sets   Supine with head on deflated ball:  toungue pressed on roof of mouth: cervical rotation R and L 10x2 each way.   Improved exercise technique, movement at target joints, use of target muscles after mod verbal, visual, tactile cues.    Decreased neck pain with cervical rotation following treatment to decrease muscle tension to cervical paraspinal and upper trap muscles. Pt tolerated session well without aggravation of symptoms.         PT  Long Term Goals - 10/15/17 1840      PT  LONG TERM GOAL #1   Title  Patient will have a decrease in R hip pain to 4/10 or less at worst to promote ability to perform funcitonal tasks, tolerate R S/L position.     Baseline  8/10 R hip pain at worst for the past 2 months (10/15/2017)    Time  8    Period  Weeks    Status  New    Target Date  12/13/17      PT LONG TERM GOAL #2   Title  Patient will have a decrease in neck pain to 4/10 or less at worst to promote ability to look around as well as work at her desk with less pain.     Baseline  8/10 at worst (10/15/2017)    Time  8    Period  Weeks    Status  New    Target Date  12/13/17      PT LONG TERM GOAL #3   Title  Patient will have a decrease in low back pain to 4/10 or less at worst to promote ability to tolerate standing for longer periods of time.     Baseline  8/10 back pain at worst (10/15/2017)    Time  8    Period  Weeks    Status  New    Target Date  12/13/17      PT LONG TERM GOAL #4   Title  Pt will improve her neck FOTO score by at least 8 points as a demonstration of improved function.     Baseline  65 (neck, 10/15/2017)    Time  8    Period  Weeks    Status  New    Target Date  12/13/17      PT LONG TERM GOAL #5   Title  Pt will improve her NDI score by at least 12% as a demonstration of improved function.     Baseline  30% (10/15/2017)    Time  8    Period  Weeks    Status  New    Target Date  12/13/17            Plan - 10/22/17 1020    Clinical Impression Statement  Decreased neck pain with cervical rotation following treatment to decrease muscle tension to cervical paraspinal and upper trap muscles. Pt tolerated session well without aggravation of symptoms.     Rehab Potential  Fair    Clinical Impairments Affecting Rehab Potential  Chronicity of condition, multiple areas of pain, difficulty tolerating positions such as prolonged standing or R S/L     PT Frequency  2x / week    PT Duration  8 weeks     PT Treatment/Interventions  Neuromuscular re-education;Therapeutic exercise;Therapeutic activities;Manual techniques;Aquatic Therapy;Electrical Stimulation;Iontophoresis 4mg /ml Dexamethasone;Traction;Ultrasound;Patient/family education;Dry needling    PT Next Visit Plan  glute med and max strengthening, scapular strengthening, gentle extension, trunk strengthening, anterior cervical strengthening, posture, manual techniques, modalities PRN    Consulted and Agree with Plan of Care  Patient       Patient will benefit from skilled therapeutic intervention in order to improve the following deficits and impairments:  Pain, Postural dysfunction, Improper body mechanics, Decreased strength  Visit Diagnosis: Pain in right hip  Cervicalgia  Chronic bilateral low back pain, with sciatica presence unspecified  Muscle weakness (generalized)     Problem List Patient Active Problem List   Diagnosis Date Noted  . Abnormal Pap smear of cervix 07/27/2017  . Vasomotor  flushing 07/19/2017  . Depression 07/18/2017  . Nausea 06/25/2017  . Hip pain, right 06/25/2017  . OSA (obstructive sleep apnea) 04/18/2017  . Annual physical exam 04/10/2017  . Fatigue 12/16/2016  . Chronic back pain 05/30/2016  . Abdominal pain 03/12/2016  . Anxiety and depression 12/28/2015  . History of obstructive sleep apnea 12/15/2015  . Thyroid nodule 12/15/2015  . GERD (gastroesophageal reflux disease) 12/15/2015  . Hyperlipidemia 12/15/2015  . Pain medication agreement signed 03/25/2015  . CAD in native artery 02/05/2015  . Right supraspinatus tenosynovitis 08/17/2014  . Crohn's disease involving terminal ileum ([place]) 03/30/2014  . FH: colon polyps 03/30/2014  . 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 Other bursitis disorders 07/12/2012  . Pain in joint, pelvic region and thigh 07/12/2012  . Crohn's disease ([place]) 01/23/2012    Joneen Boers PT, DPT   10/22/2017, 11:01 AM  [place] [facility] [address] [place], [address] Phone: [phone]   Fax:  [phone]  Name: Carolyn Esparza MRN: [id number] Date of Birth: 03/22/1966

## 2017-10-24 ENCOUNTER — Ambulatory Visit: Payer: Managed Care, Other (non HMO)

## 2017-10-24 DIAGNOSIS — M542 Cervicalgia: Secondary | ICD-10-CM

## 2017-10-24 DIAGNOSIS — M25551 Pain in right hip: Secondary | ICD-10-CM | POA: Diagnosis not present

## 2017-10-24 DIAGNOSIS — M6281 Muscle weakness (generalized): Secondary | ICD-10-CM

## 2017-10-24 NOTE — Patient Instructions (Addendum)
Gave lower trap raise at wall 10x2 with 5 second holds each UE as part of her HEP. Pt demonstrated and verbalized understanding. Handout provided.    Gave supine cervical rotation and nodding 1 min x 3 with head on deflated ball as part of her HEP. Pt demonstrated and verbalized understanding

## 2017-10-24 NOTE — Therapy (Signed)
[place] [facility] [address], [place], [address] Phone: [phone]   Fax:  [phone]  Physical Therapy Treatment  Patient Details  Name: Carolyn Esparza MRN: [id number] Date of Birth: 24-Feb-1966 Referring Provider: Orland Mustard, MD   Encounter Date: 10/24/2017  PT End of Session - 10/24/17 1606    Visit Number  4    Number of Visits  17    Date for PT Re-Evaluation  12/13/17    PT Start Time  2683    PT Stop Time  1655    PT Time Calculation (min)  49 min    Activity Tolerance  Patient tolerated treatment well    Behavior During Therapy  [facility] for tasks assessed/performed       Past Medical History:  Diagnosis Date  . Anxiety   . ASCUS with positive high risk HPV cervical   . CAD (coronary artery disease)   . Chicken pox   . Chronic pain    neck, back, b/l hips   . Depression   . GERD (gastroesophageal reflux disease)   . History of Crohn's disease   . Hyperlipidemia   . Left ovarian cyst    s/p removal of 1 ovary ? which one removed per pt   . Libido, decreased   . Thyroid nodule     Past Surgical History:  Procedure Laterality Date  . ABDOMINAL HYSTERECTOMY    . APPENDECTOMY  2004  . BLADDER SURGERY    . BREAST BIOPSY     x 2  . COLONOSCOPY    . OOPHORECTOMY     x1 ? which one removed per pt   . WISDOM TOOTH EXTRACTION      There were no vitals filed for this visit.  Subjective Assessment - 10/24/17 1607    Subjective  Neck is not so good. Has been bothering since she got up this morning. Neck felt fine the rest of the day after last session. Neck bothered her again when she got up the next morning. Got it to calm down by using heat. Woke up this morning and neck felt a little worse compared to yesterday. R hip woke her up this morning. Also feels like the shot for her back is wearing off.  8/10 neck pain currently, 5/10 back pain, 7/10 R hip pain currently.     Pertinent  History  Had facet shots in her low back last month (steriod) which pt states that it put a band aid on it. Had back problems for about 15 years. Currenlty has a difficult time standing up from sitting. Also has neck pain since 3-4 months ago, gradual onset. 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  Pt sits in front of her computer all day. Adjusted computer monitor to eye level. Feels like stress and use of the phone (Iphone 8, uses her R ear predominantly but now uses an ear pod on L ear after having neck pain which helps with the talking on the phone) might play a factor.  Tried neck stretches such as cervical flexion, UT stretch and does not know if they help.  Pt states having L hand numbness when using her computer mouse. 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  When L hand gets numb she feels cramping. Has to  shake her L hand to get the feeling back (possible ulnar nerve distribution but pt unsure).  Neck and both hips bother her more currently since she had shots for her back recently. R hip pain bothers her more. Was told that she has bursitis. Had shots in her R hip years ago which temporarily helped. Now has a hard time laying on her R hip when sleeping. R hip pain runs laterally down her thigh to just proximal to her knee (along the ITband/L5 distribution). Has been using lidocaine patches on her hip from time to time.  The shots she had in her back helped with her hip pain temporarily.   Neck and hips bothered her the most even before her low back shots.   R hip bothers her more than her neck.  Sits on a chair with a lumbar support.   Pt states back pain does not go down her legs but it goes around her torso (underneath belly button) when it gets real bad.  R low back pain > L.     Patient Stated Goals  Not to hurt. To be able to not to be able to not  worry about how long she is standing, not to worry about her hip hurting while she is sleeping, for her neck not to hurt when she looks around.     Currently in Pain?  Yes    Pain Score  8     Pain Onset  More than a month ago                No data recorded               PT Education - 10/24/17 1614    Education provided  Yes    Education Details  ther-ex    [employer]) Educated  Patient    Methods  Explanation;Demonstration;Tactile cues;Verbal cues    Comprehension  Returned demonstration;Verbalized understanding         Objectives       Ther-ex  Standing L shoulder extension yellow band 10x3  Lower trap raise at the wall 10x5 seconds for 2 sets  Supine with head on deflated ball:             toungue pressed on roof of mouth: cervical nodding 1 min x 3   cervical rotation each way with chin tuck 1 min x 3  Chin tucks 10x5 seconds. No increase in pain.  Seated manually resisted scapular retraction targeting the lower trap muscles 10x 5 seconds for 2 sets each side  Try cervical extension next visit if appropriate.      Improved exercise technique, movement at target joints, use of target muscles after mod verbal, visual, tactile cues.       Manual therapy   Seated STM R and L posterior cervical paraspinal muscle.   Seated STM R upper trap muscle.      Worked on lower trap strengthening to decrease upper trap tension to neck as well as promoting comfortable cervical movement. No change in neck pain during cervical rotation following manual therapy today. Pt tolerated session well without aggravation of symptoms.              PT Long Term Goals - 10/15/17 1840      PT LONG TERM GOAL #1   Title  Patient will have a decrease in R hip pain to 4/10 or less at worst to promote ability to perform funcitonal tasks, tolerate R S/L position.  Baseline  8/10 R hip pain at worst for the past 2 months (10/15/2017)     Time  8    Period  Weeks    Status  New    Target Date  12/13/17      PT LONG TERM GOAL #2   Title  Patient will have a decrease in neck pain to 4/10 or less at worst to promote ability to look around as well as work at her desk with less pain.     Baseline  8/10 at worst (10/15/2017)    Time  8    Period  Weeks    Status  New    Target Date  12/13/17      PT LONG TERM GOAL #3   Title  Patient will have a decrease in low back pain to 4/10 or less at worst to promote ability to tolerate standing for longer periods of time.     Baseline  8/10 back pain at worst (10/15/2017)    Time  8    Period  Weeks    Status  New    Target Date  12/13/17      PT LONG TERM GOAL #4   Title  Pt will improve her neck FOTO score by at least 8 points as a demonstration of improved function.     Baseline  65 (neck, 10/15/2017)    Time  8    Period  Weeks    Status  New    Target Date  12/13/17      PT LONG TERM GOAL #5   Title  Pt will improve her NDI score by at least 12% as a demonstration of improved function.     Baseline  30% (10/15/2017)    Time  8    Period  Weeks    Status  New    Target Date  12/13/17            Plan - 10/24/17 1618    Clinical Impression Statement  Worked on lower trap strengthening to decrease upper trap tension to neck as well as promoting comfortable cervical movement. No change in neck pain during cervical rotation following manual therapy today. Pt tolerated session well without aggravation of symptoms.     Rehab Potential  Fair    Clinical Impairments Affecting Rehab Potential  Chronicity of condition, multiple areas of pain, difficulty tolerating positions such as prolonged standing or R S/L     PT Frequency  2x / week    PT Duration  8 weeks    PT Treatment/Interventions  Neuromuscular re-education;Therapeutic exercise;Therapeutic activities;Manual techniques;Aquatic Therapy;Electrical Stimulation;Iontophoresis 4mg /ml  Dexamethasone;Traction;Ultrasound;Patient/family education;Dry needling    PT Next Visit Plan  glute med and max strengthening, scapular strengthening, gentle extension, trunk strengthening, anterior cervical strengthening, posture, manual techniques, modalities PRN    Consulted and Agree with Plan of Care  Patient       Patient will benefit from skilled therapeutic intervention in order to improve the following deficits and impairments:  Pain, Postural dysfunction, Improper body mechanics, Decreased strength  Visit Diagnosis: Cervicalgia  Muscle weakness (generalized)     Problem List Patient Active Problem List   Diagnosis Date Noted  . Abnormal Pap smear of cervix 07/27/2017  . Vasomotor flushing 07/19/2017  . Depression 07/18/2017  . Nausea 06/25/2017  . Hip pain, right 06/25/2017  . OSA (obstructive sleep apnea) 04/18/2017  . Annual physical exam 04/10/2017  . Fatigue 12/16/2016  . Chronic back pain 05/30/2016  .  Abdominal pain 03/12/2016  . Anxiety and depression 12/28/2015  . History of obstructive sleep apnea 12/15/2015  . Thyroid nodule 12/15/2015  . GERD (gastroesophageal reflux disease) 12/15/2015  . Hyperlipidemia 12/15/2015  . Pain medication agreement signed 03/25/2015  . CAD in native artery 02/05/2015  . Right supraspinatus tenosynovitis 08/17/2014  . Crohn's disease involving terminal ileum ([place]) 03/30/2014  . FH: colon polyps 03/30/2014  . 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 Other bursitis disorders 07/12/2012  . Pain in joint, pelvic region and thigh 07/12/2012  . Crohn's disease ([place]) 01/23/2012    Joneen Boers PT, DPT   10/24/2017, 5:07 PM  [place] [facility] [address] [place], [address] Phone: [phone]   Fax:  [phone]  Name: Carolyn Esparza MRN: [id number] Date of Birth: [birth year]

## 2017-10-30 ENCOUNTER — Ambulatory Visit: Payer: Managed Care, Other (non HMO) | Attending: Internal Medicine

## 2017-10-30 DIAGNOSIS — M25551 Pain in right hip: Secondary | ICD-10-CM | POA: Insufficient documentation

## 2017-10-30 DIAGNOSIS — M542 Cervicalgia: Secondary | ICD-10-CM | POA: Insufficient documentation

## 2017-10-30 DIAGNOSIS — M25552 Pain in left hip: Secondary | ICD-10-CM | POA: Diagnosis present

## 2017-10-30 DIAGNOSIS — G8929 Other chronic pain: Secondary | ICD-10-CM | POA: Diagnosis present

## 2017-10-30 DIAGNOSIS — M6281 Muscle weakness (generalized): Secondary | ICD-10-CM | POA: Diagnosis present

## 2017-10-30 DIAGNOSIS — M545 Low back pain: Secondary | ICD-10-CM | POA: Insufficient documentation

## 2017-10-30 NOTE — Therapy (Signed)
[facility] [address], [place], [address] Phone: [phone]   Fax:  [phone]  Physical Therapy Treatment  Patient Details  Name: CORLETTE CIANO MRN: [id number] Date of Birth: 1966/03/09 Referring Provider: Orland Mustard, MD   Encounter Date: 10/30/2017  PT End of Session - 10/30/17 1715    Visit Number  5    Number of Visits  17    Date for PT Re-Evaluation  12/13/17    PT Start Time  2800    PT Stop Time  1808    PT Time Calculation (min)  53 min    Activity Tolerance  Patient tolerated treatment well    Behavior During Therapy  [facility] for tasks assessed/performed       Past Medical History:  Diagnosis Date  . Anxiety   . ASCUS with positive high risk HPV cervical   . CAD (coronary artery disease)   . Chicken pox   . Chronic pain    neck, back, b/l hips   . Depression   . GERD (gastroesophageal reflux disease)   . History of Crohn's disease   . Hyperlipidemia   . Left ovarian cyst    s/p removal of 1 ovary ? which one removed per pt   . Libido, decreased   . Thyroid nodule     Past Surgical History:  Procedure Laterality Date  . ABDOMINAL HYSTERECTOMY    . APPENDECTOMY  2004  . BLADDER SURGERY    . BREAST BIOPSY     x 2  . COLONOSCOPY    . OOPHORECTOMY     x1 ? which one removed per pt   . WISDOM TOOTH EXTRACTION      There were no vitals filed for this visit.  Subjective Assessment - 10/30/17 1716    Subjective  Neck is not as bad today. Was aggravated over the weekend and last night. Used heat which helped. Used the egg crate mattress topper. Hip feels better. Pt states that both lateral hips hurt when she walks briskly (fast walk). Casual walking does not bother her hips as much. Hips and back are not quite as bad. Sees some improvement in her hips.  4-5/10 neck, 3/10 back, 4/10 R hip, 3/10 L hip pain currently.     Pertinent History  Had facet shots in her low back  last month (steriod) which pt states that it put a band aid on it. Had back problems for about 15 years. Currenlty has a difficult time standing up from sitting. Also has neck pain since 3-4 months ago, gradual onset. 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  Pt sits in front of her computer all day. Adjusted computer monitor to eye level. Feels like stress and use of the phone (Iphone 8, uses her R ear predominantly but now uses an ear pod on L ear after having neck pain which helps with the talking on the phone) might play a factor.  Tried neck stretches such as cervical flexion, UT stretch and does not know if they help.  Pt states having L hand numbness when using her computer mouse. 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  When L hand gets numb she feels cramping. Has to shake her L hand to get the feeling back (possible ulnar nerve  distribution but pt unsure).  Neck and both hips bother her more currently since she had shots for her back recently. R hip pain bothers her more. Was told that she has bursitis. Had shots in her R hip years ago which temporarily helped. Now has a hard time laying on her R hip when sleeping. R hip pain runs laterally down her thigh to just proximal to her knee (along the ITband/L5 distribution). Has been using lidocaine patches on her hip from time to time.  The shots she had in her back helped with her hip pain temporarily.   Neck and hips bothered her the most even before her low back shots.   R hip bothers her more than her neck.  Sits on a chair with a lumbar support.   Pt states back pain does not go down her legs but it goes around her torso (underneath belly button) when it gets real bad.  R low back pain > L.     Patient Stated Goals  Not to hurt. To be able to not to be able to not worry about how long she is standing, not  to worry about her hip hurting while she is sleeping, for her neck not to hurt when she looks around.     Currently in Pain?  Yes    Pain Score  5  4-5/10 neck pain    Pain Onset  More than a month ago                               PT Education - 10/30/17 1753    Education provided  Yes    Education Details  ther-ex    [employer]) Educated  Patient    Methods  Explanation;Demonstration;Tactile cues;Verbal cues    Comprehension  Verbalized understanding;Returned demonstration         Objectives   Ther-ex  Seated thoracic extension over chair for 10x 5 seconds for 2 sets  Seated cervical extension 10x. No change in symptoms  OMEGA rows plate 5 for 97X4  Then plate 10 for 80X6  Lower trap raise at the wall 10x5 seconds  Standing bilateral horizontal abduction resisting yellow band 10x5 seconds for 2 sets   Seated press-ups 10x2  Seated L LE leg press resisting green band, with scapular retraction (pt holding band)   Green band 10x  Blue band 10x  Blue and green band 10x2  Seated L hip extension isometrics with L foot on 3 inch step 10x5 seconds for 2 sets  Try cervical flexion isometrics resisting yellow band (around forehead, band behind with pt taking a step forward) next visit if appropriate to help work anterior cervical muscles.     Improved exercise technique, movement at target joints, use of target muscles after min to mod verbal, visual, tactile cues.     Manual therapy    Seated STM L rhomboid muscle. No pain with R cervical rotation.   Decreased overall neck pain to 4/10  Seated STM to cervical paraspinals bilaterally. Decreased tightness.    Continued working on scapular strengthening to decrease upper trap tension, to help decrease neck pain as well as glute strengthening to help with back pain. No change in symptoms today with exercises. Decreased neck pain cervical rotation following manual therapy to decrease L  rhomboid muscle tension. Pt tolerated session well without aggravation of symptoms.     PT Long Term Goals - 10/15/17 1840  PT LONG TERM GOAL #1   Title  Patient will have a decrease in R hip pain to 4/10 or less at worst to promote ability to perform funcitonal tasks, tolerate R S/L position.     Baseline  8/10 R hip pain at worst for the past 2 months (10/15/2017)    Time  8    Period  Weeks    Status  New    Target Date  12/13/17      PT LONG TERM GOAL #2   Title  Patient will have a decrease in neck pain to 4/10 or less at worst to promote ability to look around as well as work at her desk with less pain.     Baseline  8/10 at worst (10/15/2017)    Time  8    Period  Weeks    Status  New    Target Date  12/13/17      PT LONG TERM GOAL #3   Title  Patient will have a decrease in low back pain to 4/10 or less at worst to promote ability to tolerate standing for longer periods of time.     Baseline  8/10 back pain at worst (10/15/2017)    Time  8    Period  Weeks    Status  New    Target Date  12/13/17      PT LONG TERM GOAL #4   Title  Pt will improve her neck FOTO score by at least 8 points as a demonstration of improved function.     Baseline  65 (neck, 10/15/2017)    Time  8    Period  Weeks    Status  New    Target Date  12/13/17      PT LONG TERM GOAL #5   Title  Pt will improve her NDI score by at least 12% as a demonstration of improved function.     Baseline  30% (10/15/2017)    Time  8    Period  Weeks    Status  New    Target Date  12/13/17            Plan - 10/30/17 1715    Clinical Impression Statement  Continued working on scapular strengthening to decrease upper trap tension, to help decrease neck pain as well as glute strengthening to help with back pain. No change in symptoms today with exercises. Decreased neck pain cervical rotation following manual therapy to decrease L rhomboid muscle tension. Pt tolerated session well without aggravation of  symptoms.     Rehab Potential  Fair    Clinical Impairments Affecting Rehab Potential  Chronicity of condition, multiple areas of pain, difficulty tolerating positions such as prolonged standing or R S/L     PT Frequency  2x / week    PT Duration  8 weeks    PT Treatment/Interventions  Neuromuscular re-education;Therapeutic exercise;Therapeutic activities;Manual techniques;Aquatic Therapy;Electrical Stimulation;Iontophoresis 4mg /ml Dexamethasone;Traction;Ultrasound;Patient/family education;Dry needling    PT Next Visit Plan  glute med and max strengthening, scapular strengthening, gentle extension, trunk strengthening, anterior cervical strengthening, posture, manual techniques, modalities PRN    Consulted and Agree with Plan of Care  Patient       Patient will benefit from skilled therapeutic intervention in order to improve the following deficits and impairments:  Pain, Postural dysfunction, Improper body mechanics, Decreased strength  Visit Diagnosis: Cervicalgia  Muscle weakness (generalized)  Pain in right hip  Chronic bilateral low back pain, with sciatica  presence unspecified  Pain in left hip     Problem List Patient Active Problem List   Diagnosis Date Noted  . Abnormal Pap smear of cervix 07/27/2017  . Vasomotor flushing 07/19/2017  . Depression 07/18/2017  . Nausea 06/25/2017  . Hip pain, right 06/25/2017  . OSA (obstructive sleep apnea) 04/18/2017  . Annual physical exam 04/10/2017  . Fatigue 12/16/2016  . Chronic back pain 05/30/2016  . Abdominal pain 03/12/2016  . Anxiety and depression 12/28/2015  . History of obstructive sleep apnea 12/15/2015  . Thyroid nodule 12/15/2015  . GERD (gastroesophageal reflux disease) 12/15/2015  . Hyperlipidemia 12/15/2015  . Pain medication agreement signed 03/25/2015  . CAD in native artery 02/05/2015  . Right supraspinatus tenosynovitis 08/17/2014  . Crohn's disease involving terminal ileum ([place]) 03/30/2014  . FH: colon  polyps 03/30/2014  . 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 Other bursitis disorders 07/12/2012  . Pain in joint, pelvic region and thigh 07/12/2012  . Crohn's disease ([place]) 01/23/2012    Joneen Boers PT, DPT   10/30/2017, 6:17 PM  [place] [facility] [address] [place], [address] Phone: [phone]   Fax:  [phone]  Name: ANNAI HEICK MRN: [id number] Date of Birth: 06-07-1966

## 2017-11-01 ENCOUNTER — Ambulatory Visit: Payer: Managed Care, Other (non HMO)

## 2017-11-01 DIAGNOSIS — M542 Cervicalgia: Secondary | ICD-10-CM | POA: Diagnosis not present

## 2017-11-01 DIAGNOSIS — M6281 Muscle weakness (generalized): Secondary | ICD-10-CM

## 2017-11-01 NOTE — Therapy (Signed)
[facility] [address] [place], [address] Phone: [phone]   Fax:  [phone]  Physical Therapy Treatment  Patient Details  Name: Carolyn Esparza MRN: [id number] Date of Birth: 1965/10/25 Referring Provider: Orland Mustard, MD   Encounter Date: 11/01/2017  PT End of Session - 11/01/17 0935    Visit Number  6    Number of Visits  17    Date for PT Re-Evaluation  12/13/17    PT Start Time  0935    PT Stop Time  1025    PT Time Calculation (min)  50 min    Activity Tolerance  Patient tolerated treatment well    Behavior During Therapy  [facility] for tasks assessed/performed       Past Medical History:  Diagnosis Date  . Anxiety   . ASCUS with positive high risk HPV cervical   . CAD (coronary artery disease)   . Chicken pox   . Chronic pain    neck, back, b/l hips   . Depression   . GERD (gastroesophageal reflux disease)   . History of Crohn's disease   . Hyperlipidemia   . Left ovarian cyst    s/p removal of 1 ovary ? which one removed per pt   . Libido, decreased   . Thyroid nodule     Past Surgical History:  Procedure Laterality Date  . ABDOMINAL HYSTERECTOMY    . APPENDECTOMY  2004  . BLADDER SURGERY    . BREAST BIOPSY     x 2  . COLONOSCOPY    . OOPHORECTOMY     x1 ? which one removed per pt   . WISDOM TOOTH EXTRACTION      There were no vitals filed for this visit.  Subjective Assessment - 11/01/17 0936    Subjective  Neck is not so good. Had a bad day yesterday. Had to use some heat and Advil. Turning her head does not bother her as much since last session.  Currently has difficulty looking down and to the R > to the L.  Feels pain mostly in the back of her neck predominantly on the R side (R paraspinal muscles).  6/10 neck pain currently.  3/10 back pain currently. The egg crate mattress topper helps with her hip pain (4/10 bilateral hip pain currently.     Pertinent History   Had facet shots in her low back last month (steriod) which pt states that it put a band aid on it. Had back problems for about 15 years. Currenlty has a difficult time standing up from sitting. Also has neck pain since 3-4 months ago, gradual onset. 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  Pt sits in front of her computer all day. Adjusted computer monitor to eye level. Feels like stress and use of the phone (Iphone 8, uses her R ear predominantly but now uses an ear pod on L ear after having neck pain which helps with the talking on the phone) might play a factor.  Tried neck stretches such as cervical flexion, UT stretch and does not know if they help.  Pt states having L hand numbness when using her computer mouse. 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  When L hand gets numb she feels cramping. Has to shake her  L hand to get the feeling back (possible ulnar nerve distribution but pt unsure).  Neck and both hips bother her more currently since she had shots for her back recently. R hip pain bothers her more. Was told that she has bursitis. Had shots in her R hip years ago which temporarily helped. Now has a hard time laying on her R hip when sleeping. R hip pain runs laterally down her thigh to just proximal to her knee (along the ITband/L5 distribution). Has been using lidocaine patches on her hip from time to time.  The shots she had in her back helped with her hip pain temporarily.   Neck and hips bothered her the most even before her low back shots.   R hip bothers her more than her neck.  Sits on a chair with a lumbar support.   Pt states back pain does not go down her legs but it goes around her torso (underneath belly button) when it gets real bad.  R low back pain > L.     Patient Stated Goals  Not to hurt. To be able to not to be able to not worry  about how long she is standing, not to worry about her hip hurting while she is sleeping, for her neck not to hurt when she looks around.     Currently in Pain?  Yes    Pain Score  6     Pain Onset  More than a month ago         [facility] PT Assessment - 11/01/17 1000      Observation/Other Assessments   Observations  (-) alar ligament test, (-) VBI test                           PT Education - 11/01/17 0951    Education provided  Yes    Education Details  ther-ex, HEP    Person(s) Educated  Patient    Methods  Explanation;Demonstration;Tactile cues;Verbal cues    Comprehension  Verbalized understanding;Returned demonstration         Objectives   Ther-ex  Standing cervical flexion isometrics resisting yellow band (pt stepping forward to feel moderate resistance) 5x 10 seconds for 2 sets. Felt good anterior neck muscle work  Supine cervical nodding 10x5 seconds for 2 sets to promote anterior cervical muscle use  Supine deep cervical flexion  5x2. Increased time secondary to emphasis on proper movement.,   OMEGA rows plate 10 for 72Z3  Upgraded lower trap raise at wall HEP and added cervical nodding with head resting on deflated ball to her HEP. Pt demonstrated and verbalized understanding.    Improved exercise technique, movement at target joints, use of target muscles after min to mod verbal, visual, tactile cues.    Manual therapy   Supine R UPA to C5 transverse process grade 3 to 3+ to decrease stiffness. Pt states that is the area of her stiffness.   No change in neck discomfort when looking down and to the R or L    Worked on anterior cervical muscle activation/strengthening and joint mobility to help decrease posterior cervical muscle tension. No change in neck symptoms today. Cervical rotation level of comfort seems to be improving however, based on pt subjective reports.       PT Long Term Goals - 10/15/17 1840      PT LONG  TERM GOAL #1   Title  Patient will have a decrease  in R hip pain to 4/10 or less at worst to promote ability to perform funcitonal tasks, tolerate R S/L position.     Baseline  8/10 R hip pain at worst for the past 2 months (10/15/2017)    Time  8    Period  Weeks    Status  New    Target Date  12/13/17      PT LONG TERM GOAL #2   Title  Patient will have a decrease in neck pain to 4/10 or less at worst to promote ability to look around as well as work at her desk with less pain.     Baseline  8/10 at worst (10/15/2017)    Time  8    Period  Weeks    Status  New    Target Date  12/13/17      PT LONG TERM GOAL #3   Title  Patient will have a decrease in low back pain to 4/10 or less at worst to promote ability to tolerate standing for longer periods of time.     Baseline  8/10 back pain at worst (10/15/2017)    Time  8    Period  Weeks    Status  New    Target Date  12/13/17      PT LONG TERM GOAL #4   Title  Pt will improve her neck FOTO score by at least 8 points as a demonstration of improved function.     Baseline  65 (neck, 10/15/2017)    Time  8    Period  Weeks    Status  New    Target Date  12/13/17      PT LONG TERM GOAL #5   Title  Pt will improve her NDI score by at least 12% as a demonstration of improved function.     Baseline  30% (10/15/2017)    Time  8    Period  Weeks    Status  New    Target Date  12/13/17            Plan - 11/01/17 0951    Clinical Impression Statement  Worked on anterior cervical muscle activation/strengthening and joint mobility to help decrease posterior cervical muscle tension. No change in neck symptoms today. Cervical rotation level of comfort seems to be improving however, based on pt subjective reports.     Rehab Potential  Fair    Clinical Impairments Affecting Rehab Potential  Chronicity of condition, multiple areas of pain, difficulty tolerating positions such as prolonged standing or R S/L     PT Frequency  2x / week    PT  Duration  8 weeks    PT Treatment/Interventions  Neuromuscular re-education;Therapeutic exercise;Therapeutic activities;Manual techniques;Aquatic Therapy;Electrical Stimulation;Iontophoresis 4mg /ml Dexamethasone;Traction;Ultrasound;Patient/family education;Dry needling    PT Next Visit Plan  glute med and max strengthening, scapular strengthening, gentle extension, trunk strengthening, anterior cervical strengthening, posture, manual techniques, modalities PRN    Consulted and Agree with Plan of Care  Patient       Patient will benefit from skilled therapeutic intervention in order to improve the following deficits and impairments:  Pain, Postural dysfunction, Improper body mechanics, Decreased strength  Visit Diagnosis: Cervicalgia  Muscle weakness (generalized)     Problem List Patient Active Problem List   Diagnosis Date Noted  . Abnormal Pap smear of cervix 07/27/2017  . Vasomotor flushing 07/19/2017  . Depression 07/18/2017  . Nausea 06/25/2017  . Hip pain, right 06/25/2017  .  OSA (obstructive sleep apnea) 04/18/2017  . Annual physical exam 04/10/2017  . Fatigue 12/16/2016  . Chronic back pain 05/30/2016  . Abdominal pain 03/12/2016  . Anxiety and depression 12/28/2015  . History of obstructive sleep apnea 12/15/2015  . Thyroid nodule 12/15/2015  . GERD (gastroesophageal reflux disease) 12/15/2015  . Hyperlipidemia 12/15/2015  . Pain medication agreement signed 03/25/2015  . CAD in native artery 02/05/2015  . Right supraspinatus tenosynovitis 08/17/2014  . Crohn's disease involving terminal ileum ([place]) 03/30/2014  . FH: colon polyps 03/30/2014  . 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 Other bursitis disorders 07/12/2012  . Pain in joint, pelvic region and thigh 07/12/2012  . Crohn's disease ([place]) 01/23/2012    Joneen Boers PT, DPT   11/01/2017, 8:28 PM  [facility] [address], [place],  [address] Phone: [phone]   Fax:  [phone]  Name: Carolyn Esparza MRN: [id number] Date of Birth: 12/19/1965

## 2017-11-01 NOTE — Patient Instructions (Addendum)
Upgragaded lower trap raise at wall HEP to 3x5 with 10 seconds holds (increased hold time, decreased number of repetitions)  Also added cervical nodding on deflated ball 10x3 or 1 min x 3 as part of her HEP.   Pt demonstrated and verbalized understanding.

## 2017-11-05 ENCOUNTER — Ambulatory Visit: Payer: Managed Care, Other (non HMO)

## 2017-11-05 DIAGNOSIS — M6281 Muscle weakness (generalized): Secondary | ICD-10-CM

## 2017-11-05 DIAGNOSIS — M542 Cervicalgia: Secondary | ICD-10-CM

## 2017-11-05 NOTE — Therapy (Signed)
[facility] [address] [place], [address] Phone: [phone]   Fax:  [phone]  Physical Therapy Treatment  Patient Details  Name: Carolyn Esparza MRN: [id number] Date of Birth: 1966/04/06 Referring Provider: Orland Mustard, MD   Encounter Date: 11/05/2017  PT End of Session - 11/05/17 0807    Visit Number  7    Number of Visits  17    Date for PT Re-Evaluation  12/13/17    PT Start Time  0807    PT Stop Time  0852    PT Time Calculation (min)  45 min    Activity Tolerance  Patient tolerated treatment well    Behavior During Therapy  [facility] for tasks assessed/performed       Past Medical History:  Diagnosis Date  . Anxiety   . ASCUS with positive high risk HPV cervical   . CAD (coronary artery disease)   . Chicken pox   . Chronic pain    neck, back, b/l hips   . Depression   . GERD (gastroesophageal reflux disease)   . History of Crohn's disease   . Hyperlipidemia   . Left ovarian cyst    s/p removal of 1 ovary ? which one removed per pt   . Libido, decreased   . Thyroid nodule     Past Surgical History:  Procedure Laterality Date  . ABDOMINAL HYSTERECTOMY    . APPENDECTOMY  2004  . BLADDER SURGERY    . BREAST BIOPSY     x 2  . COLONOSCOPY    . OOPHORECTOMY     x1 ? which one removed per pt   . WISDOM TOOTH EXTRACTION      There were no vitals filed for this visit.  Subjective Assessment - 11/05/17 0809    Subjective  Neck is still bothering her mostly on the R, about a 5/10 currently. 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  Hips are about a 5/10 and low back about a 4/10 currently.  Neck was a little sore after last session. Does not notice if it helped. Was sore.     Pertinent History  Had facet shots in her low back last month (steriod) which pt states that  it put a band aid on it. Had back problems for about 15 years. Currenlty has a difficult time standing up from sitting. Also has neck pain since 3-4 months ago, gradual onset. 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  Pt sits in front of her computer all day. Adjusted computer monitor to eye level. Feels like stress and use of the phone (Iphone 8, uses her R ear predominantly but now uses an ear pod on L ear after having neck pain which helps with the talking on the phone) might play a factor.  Tried neck stretches such as cervical flexion, UT stretch and does not know if they help.  Pt states having L hand numbness when using her computer mouse. 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  When L hand gets numb she feels cramping. Has to shake her L hand to get the feeling back (possible ulnar nerve distribution but pt unsure).  Neck and  both hips bother her more currently since she had shots for her back recently. R hip pain bothers her more. Was told that she has bursitis. Had shots in her R hip years ago which temporarily helped. Now has a hard time laying on her R hip when sleeping. R hip pain runs laterally down her thigh to just proximal to her knee (along the ITband/L5 distribution). Has been using lidocaine patches on her hip from time to time.  The shots she had in her back helped with her hip pain temporarily.   Neck and hips bothered her the most even before her low back shots.   R hip bothers her more than her neck.  Sits on a chair with a lumbar support.   Pt states back pain does not go down her legs but it goes around her torso (underneath belly button) when it gets real bad.  R low back pain > L.     Patient Stated Goals  Not to hurt. To be able to not to be able to not worry about how long she is standing, not to worry about her hip hurting while she  is sleeping, for her neck not to hurt when she looks around.     Currently in Pain?  Yes    Pain Score  5     Pain Onset  More than a month ago                               PT Education - 11/05/17 0843    Education provided  Yes    Education Details  ther-ex    Person(s) Educated  Patient    Methods  Explanation;Demonstration;Tactile cues;Verbal cues    Comprehension  Returned demonstration;Verbalized understanding         Objectives  Able to cut down on Advil. R hip is not quite as bad. Taking advil for hip and neck for the most part.   Manual therapy   Supine R UPA to C2- C5 transverse process grade 3 to 3+ to decrease stiffness.  Supine L cervical rotation with PT assist with R UPA pressure to C3-5 10x3 each level.   Not much difference with cervical rotation afterwards per pt.    Seated STM to L rhomboid/upper trap muscle area to decrease tension   Decreased neck discomfort with R rotation.    Seated L to R transverse gide to C7 TP with L cervical rotation 10x3  Decreased pain with looking to R pant pocket area      Ther-ex   Pt was recommended to squeeze her shoulder blades when she types.   Cervical rotation: R reproduced R neck pain. No neck pain with L cervical rotation.   Standing R shoulder extension resisting red band 10x3  Supine deep cervical flexion  8x3  TRX rows 10x3 to promote scapular strengthening.   Improved exercise technique, movement at target joints, use of target muscles after min to mod verbal, visual, tactile cues.   Decreased discomofirt with R cervical rotation and looking to R pant pocket with STM to L rhomboid and UT area and with L to R transverse glide to C7. Continued working on anterior cervical muscle and scapular strengthening to help decrease upper trap and posterior cervical paraspinal muscle tension. Pt tolerated session well without aggravation of symptoms.           PT Long Term  Goals - 10/15/17 1840  PT LONG TERM GOAL #1   Title  Patient will have a decrease in R hip pain to 4/10 or less at worst to promote ability to perform funcitonal tasks, tolerate R S/L position.     Baseline  8/10 R hip pain at worst for the past 2 months (10/15/2017)    Time  8    Period  Weeks    Status  New    Target Date  12/13/17      PT LONG TERM GOAL #2   Title  Patient will have a decrease in neck pain to 4/10 or less at worst to promote ability to look around as well as work at her desk with less pain.     Baseline  8/10 at worst (10/15/2017)    Time  8    Period  Weeks    Status  New    Target Date  12/13/17      PT LONG TERM GOAL #3   Title  Patient will have a decrease in low back pain to 4/10 or less at worst to promote ability to tolerate standing for longer periods of time.     Baseline  8/10 back pain at worst (10/15/2017)    Time  8    Period  Weeks    Status  New    Target Date  12/13/17      PT LONG TERM GOAL #4   Title  Pt will improve her neck FOTO score by at least 8 points as a demonstration of improved function.     Baseline  65 (neck, 10/15/2017)    Time  8    Period  Weeks    Status  New    Target Date  12/13/17      PT LONG TERM GOAL #5   Title  Pt will improve her NDI score by at least 12% as a demonstration of improved function.     Baseline  30% (10/15/2017)    Time  8    Period  Weeks    Status  New    Target Date  12/13/17            Plan - 11/05/17 0843    Clinical Impression Statement  Decreased discomofirt with R cervical rotation and looking to R pant pocket with STM to L rhomboid and UT area and with L to R transverse glide to C7. Continued working on anterior cervical muscle and scapular strengthening to help decrease upper trap and posterior cervical paraspinal muscle tension. Pt tolerated session well without aggravation of symptoms.     Rehab Potential  Fair    Clinical Impairments Affecting Rehab Potential  Chronicity of  condition, multiple areas of pain, difficulty tolerating positions such as prolonged standing or R S/L     PT Frequency  2x / week    PT Duration  8 weeks    PT Treatment/Interventions  Neuromuscular re-education;Therapeutic exercise;Therapeutic activities;Manual techniques;Aquatic Therapy;Electrical Stimulation;Iontophoresis 4mg /ml Dexamethasone;Traction;Ultrasound;Patient/family education;Dry needling    PT Next Visit Plan  glute med and max strengthening, scapular strengthening, gentle extension, trunk strengthening, anterior cervical strengthening, posture, manual techniques, modalities PRN    Consulted and Agree with Plan of Care  Patient       Patient will benefit from skilled therapeutic intervention in order to improve the following deficits and impairments:  Pain, Postural dysfunction, Improper body mechanics, Decreased strength  Visit Diagnosis: Cervicalgia  Muscle weakness (generalized)     Problem List Patient Active Problem List  Diagnosis Date Noted  . Abnormal Pap smear of cervix 07/27/2017  . Vasomotor flushing 07/19/2017  . Depression 07/18/2017  . Nausea 06/25/2017  . Hip pain, right 06/25/2017  . OSA (obstructive sleep apnea) 04/18/2017  . Annual physical exam 04/10/2017  . Fatigue 12/16/2016  . Chronic back pain 05/30/2016  . Abdominal pain 03/12/2016  . Anxiety and depression 12/28/2015  . History of obstructive sleep apnea 12/15/2015  . Thyroid nodule 12/15/2015  . GERD (gastroesophageal reflux disease) 12/15/2015  . Hyperlipidemia 12/15/2015  . Pain medication agreement signed 03/25/2015  . CAD in native artery 02/05/2015  . Right supraspinatus tenosynovitis 08/17/2014  . Crohn's disease involving terminal ileum ([place]) 03/30/2014  . FH: colon polyps 03/30/2014  . 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 Other bursitis disorders 07/12/2012  . Pain in joint, pelvic region and thigh 07/12/2012  . Crohn's disease ([place]) 01/23/2012   Joneen Boers PT, DPT    11/05/2017, 10:22 AM  [facility] [address], [place], [address] Phone: [phone]   Fax:  [phone]  Name: Carolyn Esparza MRN: [id number] Date of Birth: Dec 15, 1965

## 2017-11-07 ENCOUNTER — Ambulatory Visit: Payer: Managed Care, Other (non HMO)

## 2017-11-07 DIAGNOSIS — G8929 Other chronic pain: Secondary | ICD-10-CM

## 2017-11-07 DIAGNOSIS — M542 Cervicalgia: Secondary | ICD-10-CM

## 2017-11-07 DIAGNOSIS — M545 Low back pain: Secondary | ICD-10-CM

## 2017-11-07 DIAGNOSIS — M25551 Pain in right hip: Secondary | ICD-10-CM

## 2017-11-07 DIAGNOSIS — M6281 Muscle weakness (generalized): Secondary | ICD-10-CM

## 2017-11-07 DIAGNOSIS — M25552 Pain in left hip: Secondary | ICD-10-CM

## 2017-11-07 NOTE — Therapy (Signed)
[facility] [address] [place], [address] Phone: [phone]   Fax:  [phone]  Physical Therapy Treatment  Patient Details  Name: Carolyn Esparza MRN: [id number] Date of Birth: 01-May-1966 Referring Provider: Orland Mustard, MD   Encounter Date: 11/07/2017  PT End of Session - 11/07/17 0802    Visit Number  8    Number of Visits  17    Date for PT Re-Evaluation  12/13/17    PT Start Time  0803    PT Stop Time  0845    PT Time Calculation (min)  42 min    Activity Tolerance  Patient tolerated treatment well    Behavior During Therapy  [facility] for tasks assessed/performed       Past Medical History:  Diagnosis Date  . Anxiety   . ASCUS with positive high risk HPV cervical   . CAD (coronary artery disease)   . Chicken pox   . Chronic pain    neck, back, b/l hips   . Depression   . GERD (gastroesophageal reflux disease)   . History of Crohn's disease   . Hyperlipidemia   . Left ovarian cyst    s/p removal of 1 ovary ? which one removed per pt   . Libido, decreased   . Thyroid nodule     Past Surgical History:  Procedure Laterality Date  . ABDOMINAL HYSTERECTOMY    . APPENDECTOMY  2004  . BLADDER SURGERY    . BREAST BIOPSY     x 2  . COLONOSCOPY    . OOPHORECTOMY     x1 ? which one removed per pt   . WISDOM TOOTH EXTRACTION      There were no vitals filed for this visit.  Subjective Assessment - 11/07/17 0804    Subjective  Neck is better. Does not hurt when turning to the L. Not as bad turning to the R. Still feels symptoms R posterior neck when turning to the R. 4/10 neck pain currently. Not as bad.  Low back is about a 3/10 currently. 4/10 R and L hips. Pt states that the day of or the day after, her neck feels better after the soreness subsides.       Pertinent History  Had facet shots in her low back last month (steriod) which pt states that it put a band aid on it. Had back  problems for about 15 years. Currenlty has a difficult time standing up from sitting. Also has neck pain since 3-4 months ago, gradual onset. 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  Pt sits in front of her computer all day. Adjusted computer monitor to eye level. Feels like stress and use of the phone (Iphone 8, uses her R ear predominantly but now uses an ear pod on L ear after having neck pain which helps with the talking on the phone) might play a factor.  Tried neck stretches such as cervical flexion, UT stretch and does not know if they help.  Pt states having L hand numbness when using her computer mouse. 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  When L hand gets numb she feels cramping. Has to shake her L hand to get the feeling back (possible ulnar nerve distribution but pt unsure).  Neck and  both hips bother her more currently since she had shots for her back recently. R hip pain bothers her more. Was told that she has bursitis. Had shots in her R hip years ago which temporarily helped. Now has a hard time laying on her R hip when sleeping. R hip pain runs laterally down her thigh to just proximal to her knee (along the ITband/L5 distribution). Has been using lidocaine patches on her hip from time to time.  The shots she had in her back helped with her hip pain temporarily.   Neck and hips bothered her the most even before her low back shots.   R hip bothers her more than her neck.  Sits on a chair with a lumbar support.   Pt states back pain does not go down her legs but it goes around her torso (underneath belly button) when it gets real bad.  R low back pain > L.     Patient Stated Goals  Not to hurt. To be able to not to be able to not worry about how long she is standing, not to worry about her hip hurting while she is sleeping, for her neck not to  hurt when she looks around.     Currently in Pain?  Yes    Pain Score  4     Pain Onset  More than a month ago                               PT Education - 11/07/17 0841    Education provided  Yes    Education Details  ther-ex    [employer]) Educated  Patient    Methods  Explanation;Demonstration;Tactile cues;Verbal cues    Comprehension  Returned demonstration;Verbalized understanding          Objectives  No latex band allergies  R suboccipital area: muscle knot palpated  Pt states that the day of or the day after, her neck feels better after the soreness subsides.   Manual therapy  Supine suboccipital release Supine STM to suboccipital muscles   No change in symptoms with R cervical rotation  Seated L to R transverse gide to C7 spinous process with L cervical rotation 10x3  Seated STM to L rhomboid/upper trap muscle area to decrease tension        SeatedSTM bilateral cervical paraspinal muscles Seated STM bilateral UT muscles  Try mobilizations to C3-5 again next visit if appropriate     Ther-ex   Side stepping 32 ft to the R and 32 ft to the L to promote glute med strengthening  Then with bilateral shoulder ER with scapular retraction resisting yellow band each step.   Standing diagonal hip extension resisting yellow band with bilateral UE assist to promote glute max strengthening 10x each LE  Improved exercise technique, movement at target joints, use of target muscles after mod verbal, visual, tactile cues.    Continued working on decreasing posterior cervical, rhomboid, and upper trap muscle tension to help decrease neck pain. Added working on glute strengthening to help decrease bilateral hip and low back pain. Pt tolerated session well without aggravation of symptoms.           PT Long Term Goals - 10/15/17 1840      PT LONG TERM GOAL #1   Title  Patient will have a decrease in R hip pain to 4/10 or less at worst  to promote ability to  perform funcitonal tasks, tolerate R S/L position.     Baseline  8/10 R hip pain at worst for the past 2 months (10/15/2017)    Time  8    Period  Weeks    Status  New    Target Date  12/13/17      PT LONG TERM GOAL #2   Title  Patient will have a decrease in neck pain to 4/10 or less at worst to promote ability to look around as well as work at her desk with less pain.     Baseline  8/10 at worst (10/15/2017)    Time  8    Period  Weeks    Status  New    Target Date  12/13/17      PT LONG TERM GOAL #3   Title  Patient will have a decrease in low back pain to 4/10 or less at worst to promote ability to tolerate standing for longer periods of time.     Baseline  8/10 back pain at worst (10/15/2017)    Time  8    Period  Weeks    Status  New    Target Date  12/13/17      PT LONG TERM GOAL #4   Title  Pt will improve her neck FOTO score by at least 8 points as a demonstration of improved function.     Baseline  65 (neck, 10/15/2017)    Time  8    Period  Weeks    Status  New    Target Date  12/13/17      PT LONG TERM GOAL #5   Title  Pt will improve her NDI score by at least 12% as a demonstration of improved function.     Baseline  30% (10/15/2017)    Time  8    Period  Weeks    Status  New    Target Date  12/13/17            Plan - 11/07/17 0801    Clinical Impression Statement  Continued working on decreasing posterior cervical, rhomboid, and upper trap muscle tension to help decrease neck pain. Added working on glute strengthening to help decrease bilateral hip and low back pain. Pt tolerated session well without aggravation of symptoms.     Rehab Potential  Fair    Clinical Impairments Affecting Rehab Potential  Chronicity of condition, multiple areas of pain, difficulty tolerating positions such as prolonged standing or R S/L     PT Frequency  2x / week    PT Duration  8 weeks    PT Treatment/Interventions  Neuromuscular re-education;Therapeutic  exercise;Therapeutic activities;Manual techniques;Aquatic Therapy;Electrical Stimulation;Iontophoresis 4mg /ml Dexamethasone;Traction;Ultrasound;Patient/family education;Dry needling    PT Next Visit Plan  glute med and max strengthening, scapular strengthening, gentle extension, trunk strengthening, anterior cervical strengthening, posture, manual techniques, modalities PRN    Consulted and Agree with Plan of Care  Patient       Patient will benefit from skilled therapeutic intervention in order to improve the following deficits and impairments:  Pain, Postural dysfunction, Improper body mechanics, Decreased strength  Visit Diagnosis: Cervicalgia  Muscle weakness (generalized)  Pain in right hip  Chronic bilateral low back pain, with sciatica presence unspecified  Pain in left hip     Problem List Patient Active Problem List   Diagnosis Date Noted  . Abnormal Pap smear of cervix 07/27/2017  . Vasomotor flushing 07/19/2017  . Depression 07/18/2017  . Nausea 06/25/2017  .  Hip pain, right 06/25/2017  . OSA (obstructive sleep apnea) 04/18/2017  . Annual physical exam 04/10/2017  . Fatigue 12/16/2016  . Chronic back pain 05/30/2016  . Abdominal pain 03/12/2016  . Anxiety and depression 12/28/2015  . History of obstructive sleep apnea 12/15/2015  . Thyroid nodule 12/15/2015  . GERD (gastroesophageal reflux disease) 12/15/2015  . Hyperlipidemia 12/15/2015  . Pain medication agreement signed 03/25/2015  . CAD in native artery 02/05/2015  . Right supraspinatus tenosynovitis 08/17/2014  . Crohn's disease involving terminal ileum ([place]) 03/30/2014  . FH: colon polyps 03/30/2014  . 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 Other bursitis disorders 07/12/2012  . Pain in joint, pelvic region and thigh 07/12/2012  . Crohn's disease ([place]) 01/23/2012    Joneen Boers PT, DPT   11/07/2017, 6:52 PM  [facility] [address] [place], [address] Phone: [phone]   Fax:  [phone]  Name: Carolyn Esparza MRN: [id number] Date of Birth: [birth year]

## 2017-11-12 ENCOUNTER — Ambulatory Visit: Payer: Managed Care, Other (non HMO)

## 2017-11-12 DIAGNOSIS — M542 Cervicalgia: Secondary | ICD-10-CM | POA: Diagnosis not present

## 2017-11-12 DIAGNOSIS — G8929 Other chronic pain: Secondary | ICD-10-CM

## 2017-11-12 DIAGNOSIS — M25551 Pain in right hip: Secondary | ICD-10-CM

## 2017-11-12 DIAGNOSIS — M25552 Pain in left hip: Secondary | ICD-10-CM

## 2017-11-12 DIAGNOSIS — M545 Low back pain: Secondary | ICD-10-CM

## 2017-11-12 DIAGNOSIS — M6281 Muscle weakness (generalized): Secondary | ICD-10-CM

## 2017-11-12 NOTE — Therapy (Signed)
[facility] [address], [place], [address] Phone: [phone]   Fax:  [phone]  Physical Therapy Treatment  Patient Details  Name: Carolyn Esparza MRN: [id number] Date of Birth: 1966/03/10 Referring Provider: Orland Mustard, MD   Encounter Date: 11/12/2017  PT End of Session - 11/12/17 0805    Visit Number  9    Number of Visits  17    Date for PT Re-Evaluation  12/13/17    PT Start Time  0804    PT Stop Time  0844    PT Time Calculation (min)  40 min    Activity Tolerance  Patient tolerated treatment well    Behavior During Therapy  [facility] for tasks assessed/performed       Past Medical History:  Diagnosis Date  . Anxiety   . ASCUS with positive high risk HPV cervical   . CAD (coronary artery disease)   . Chicken pox   . Chronic pain    neck, back, b/l hips   . Depression   . GERD (gastroesophageal reflux disease)   . History of Crohn's disease   . Hyperlipidemia   . Left ovarian cyst    s/p removal of 1 ovary ? which one removed per pt   . Libido, decreased   . Thyroid nodule     Past Surgical History:  Procedure Laterality Date  . ABDOMINAL HYSTERECTOMY    . APPENDECTOMY  2004  . BLADDER SURGERY    . BREAST BIOPSY     x 2  . COLONOSCOPY    . OOPHORECTOMY     x1 ? which one removed per pt   . WISDOM TOOTH EXTRACTION      There were no vitals filed for this visit.  Subjective Assessment - 11/12/17 0805    Subjective  Neck is about a 5/10 (currently). Has noticed that pain used to be the whole back of her neck, now localized to the R side. Feels like she is making progress.   Low back is a 4/10 this morning. Better now compared to before taking a hot bath. L hip is not bothering her a whole lot unless she starts mashing on it. R hip is a 4/10 currently, not as bad as it was in the past.     Pertinent History  Had facet shots in her low back last month (steriod) which pt states  that it put a band aid on it. Had back problems for about 15 years. Currenlty has a difficult time standing up from sitting. Also has neck pain since 3-4 months ago, gradual onset. 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  Pt sits in front of her computer all day. Adjusted computer monitor to eye level. Feels like stress and use of the phone (Iphone 8, uses her R ear predominantly but now uses an ear pod on L ear after having neck pain which helps with the talking on the phone) might play a factor.  Tried neck stretches such as cervical flexion, UT stretch and does not know if they help.  Pt states having L hand numbness when using her computer mouse. 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  When L hand gets numb she feels cramping. Has to shake her L hand to get the feeling back (  possible ulnar nerve distribution but pt unsure).  Neck and both hips bother her more currently since she had shots for her back recently. R hip pain bothers her more. Was told that she has bursitis. Had shots in her R hip years ago which temporarily helped. Now has a hard time laying on her R hip when sleeping. R hip pain runs laterally down her thigh to just proximal to her knee (along the ITband/L5 distribution). Has been using lidocaine patches on her hip from time to time.  The shots she had in her back helped with her hip pain temporarily.   Neck and hips bothered her the most even before her low back shots.   R hip bothers her more than her neck.  Sits on a chair with a lumbar support.   Pt states back pain does not go down her legs but it goes around her torso (underneath belly button) when it gets real bad.  R low back pain > L.     Patient Stated Goals  Not to hurt. To be able to not to be able to not worry about how long she is standing, not to worry about her hip hurting while  she is sleeping, for her neck not to hurt when she looks around.     Currently in Pain?  Yes    Pain Score  5  R neck pain    Pain Onset  More than a month ago                               PT Education - 11/12/17 0830    Education provided  Yes    Education Details  ther-ex    [employer]) Educated  Patient    Methods  Explanation;Demonstration;Tactile cues;Verbal cues    Comprehension  Returned demonstration;Verbalized understanding         Objectives  Manual therapy  Supine R UPA to C2- C5 transverse process grade 3 to 3+ to decrease stiffness.  Supine L cervical rotation with PT assist with R UPA pressure to C3 10x,  C4-C5 10x3 each level.   Supine STM bilateral cervical paraspinal muscles  Supine assisted R cervical side bend at C2, C3, C4, C5  Seated STM R and L upper trap muscle area to decrease tension     Ther-ex   Supine deep cervical flexion 10x2  Side stepping 32 ft to the R and 32 ft to the L then 16 ft to the R and 16 ft to the L to promote glute med strengthening with bilateral shoulder ER with scapular retraction resisting yellow band each step to promote scapular strengthening  Standing diagonal hip extension resisting yellow band with bilateral UE assist to promote glute max strengthening 10x, then 5x each LE  TRX rows 10x3 to promote scapular strengthening.   Improved exercise technique, movement at target joints, use of target muscles after min to mod verbal, visual, tactile cues.   Continued working on cervical mobilization to decrease neck stiffness as well as hip and scapular strengthening to help decrease neck, hip and back pain.     PT Long Term Goals - 10/15/17 1840      PT LONG TERM GOAL #1   Title  Patient will have a decrease in R hip pain to 4/10 or less at worst to promote ability to perform funcitonal tasks, tolerate R S/L position.     Baseline  8/10 R hip  pain at worst for the past 2 months  (10/15/2017)    Time  8    Period  Weeks    Status  New    Target Date  12/13/17      PT LONG TERM GOAL #2   Title  Patient will have a decrease in neck pain to 4/10 or less at worst to promote ability to look around as well as work at her desk with less pain.     Baseline  8/10 at worst (10/15/2017)    Time  8    Period  Weeks    Status  New    Target Date  12/13/17      PT LONG TERM GOAL #3   Title  Patient will have a decrease in low back pain to 4/10 or less at worst to promote ability to tolerate standing for longer periods of time.     Baseline  8/10 back pain at worst (10/15/2017)    Time  8    Period  Weeks    Status  New    Target Date  12/13/17      PT LONG TERM GOAL #4   Title  Pt will improve her neck FOTO score by at least 8 points as a demonstration of improved function.     Baseline  65 (neck, 10/15/2017)    Time  8    Period  Weeks    Status  New    Target Date  12/13/17      PT LONG TERM GOAL #5   Title  Pt will improve her NDI score by at least 12% as a demonstration of improved function.     Baseline  30% (10/15/2017)    Time  8    Period  Weeks    Status  New    Target Date  12/13/17            Plan - 11/12/17 0834    Clinical Impression Statement  Continued working on cervical mobilization to decrease neck stiffness as well as hip and scapular strengthening to help decrease neck, hip and back pain.    Rehab Potential  Fair    Clinical Impairments Affecting Rehab Potential  Chronicity of condition, multiple areas of pain, difficulty tolerating positions such as prolonged standing or R S/L     PT Frequency  2x / week    PT Duration  8 weeks    PT Treatment/Interventions  Neuromuscular re-education;Therapeutic exercise;Therapeutic activities;Manual techniques;Aquatic Therapy;Electrical Stimulation;Iontophoresis 4mg /ml Dexamethasone;Traction;Ultrasound;Patient/family education;Dry needling    PT Next Visit Plan  glute med and max strengthening, scapular  strengthening, gentle extension, trunk strengthening, anterior cervical strengthening, posture, manual techniques, modalities PRN    Consulted and Agree with Plan of Care  Patient       Patient will benefit from skilled therapeutic intervention in order to improve the following deficits and impairments:  Pain, Postural dysfunction, Improper body mechanics, Decreased strength  Visit Diagnosis: Cervicalgia  Muscle weakness (generalized)  Pain in right hip  Chronic bilateral low back pain, with sciatica presence unspecified  Pain in left hip     Problem List Patient Active Problem List   Diagnosis Date Noted  . Abnormal Pap smear of cervix 07/27/2017  . Vasomotor flushing 07/19/2017  . Depression 07/18/2017  . Nausea 06/25/2017  . Hip pain, right 06/25/2017  . OSA (obstructive sleep apnea) 04/18/2017  . Annual physical exam 04/10/2017  . Fatigue 12/16/2016  . Chronic back pain 05/30/2016  .  Abdominal pain 03/12/2016  . Anxiety and depression 12/28/2015  . History of obstructive sleep apnea 12/15/2015  . Thyroid nodule 12/15/2015  . GERD (gastroesophageal reflux disease) 12/15/2015  . Hyperlipidemia 12/15/2015  . Pain medication agreement signed 03/25/2015  . CAD in native artery 02/05/2015  . Right supraspinatus tenosynovitis 08/17/2014  . Crohn's disease involving terminal ileum ([place]) 03/30/2014  . FH: colon polyps 03/30/2014  . 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 Other bursitis disorders 07/12/2012  . Pain in joint, pelvic region and thigh 07/12/2012  . Crohn's disease ([place]) 01/23/2012     Joneen Boers PT, DPT  11/12/2017, 8:54 AM  [facility] [address] [place], [address] Phone: [phone]   Fax:  [phone]  Name: ITSEL OPFER MRN: [id number] Date of Birth: [birth year]

## 2017-11-14 ENCOUNTER — Ambulatory Visit: Payer: Managed Care, Other (non HMO)

## 2017-11-14 DIAGNOSIS — M542 Cervicalgia: Secondary | ICD-10-CM | POA: Diagnosis not present

## 2017-11-14 DIAGNOSIS — M6281 Muscle weakness (generalized): Secondary | ICD-10-CM

## 2017-11-14 DIAGNOSIS — G8929 Other chronic pain: Secondary | ICD-10-CM

## 2017-11-14 DIAGNOSIS — M545 Low back pain: Secondary | ICD-10-CM

## 2017-11-14 NOTE — Therapy (Signed)
[facility] [address] [place], [address] Phone: [phone]   Fax:  [phone]  Physical Therapy Treatment  Patient Details  Name: ARLYNN STARE MRN: [id number] Date of Birth: [birth year] Referring Provider: Orland Mustard, MD   Encounter Date: 11/14/2017  PT End of Session - 11/14/17 1621    Visit Number  10    Number of Visits  17    Date for PT Re-Evaluation  12/13/17    PT Start Time  6962    PT Stop Time  1714    PT Time Calculation (min)  53 min    Activity Tolerance  Patient tolerated treatment well    Behavior During Therapy  [facility] for tasks assessed/performed       Past Medical History:  Diagnosis Date  . Anxiety   . ASCUS with positive high risk HPV cervical   . CAD (coronary artery disease)   . Chicken pox   . Chronic pain    neck, back, b/l hips   . Depression   . GERD (gastroesophageal reflux disease)   . History of Crohn's disease   . Hyperlipidemia   . Left ovarian cyst    s/p removal of 1 ovary ? which one removed per pt   . Libido, decreased   . Thyroid nodule     Past Surgical History:  Procedure Laterality Date  . ABDOMINAL HYSTERECTOMY    . APPENDECTOMY  2004  . BLADDER SURGERY    . BREAST BIOPSY     x 2  . COLONOSCOPY    . OOPHORECTOMY     x1 ? which one removed per pt   . WISDOM TOOTH EXTRACTION      There were no vitals filed for this visit.  Subjective Assessment - 11/14/17 1622    Subjective  Neck is worse. Woke up with increased neck pain this morning. Pretty good after last session and yesterday. Thinks the treatment last session helped. Felt like she pulled something in her back yesterday sitting in her tub when she turned her back to the L. Felt a cramp L mid back yesterday but it did not go away. Still feels it today, not as bad as it was yesterday. 6/10 neck pain currently.  5/10 low back, 4/10 bilateral hips currently.     Pertinent History   Had facet shots in her low back last month (steriod) which pt states that it put a band aid on it. Had back problems for about 15 years. Currenlty has a difficult time standing up from sitting. Also has neck pain since 3-4 months ago, gradual onset. 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  Pt sits in front of her computer all day. Adjusted computer monitor to eye level. Feels like stress and use of the phone (Iphone 8, uses her R ear predominantly but now uses an ear pod on L ear after having neck pain which helps with the talking on the phone) might play a factor.  Tried neck stretches such as cervical flexion, UT stretch and does not know if they help.  Pt states having L hand numbness when using her computer mouse. 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  When L hand gets numb she feels cramping. Has to shake her L hand to get the  feeling back (possible ulnar nerve distribution but pt unsure).  Neck and both hips bother her more currently since she had shots for her back recently. R hip pain bothers her more. Was told that she has bursitis. Had shots in her R hip years ago which temporarily helped. Now has a hard time laying on her R hip when sleeping. R hip pain runs laterally down her thigh to just proximal to her knee (along the ITband/L5 distribution). Has been using lidocaine patches on her hip from time to time.  The shots she had in her back helped with her hip pain temporarily.   Neck and hips bothered her the most even before her low back shots.   R hip bothers her more than her neck.  Sits on a chair with a lumbar support.   Pt states back pain does not go down her legs but it goes around her torso (underneath belly button) when it gets real bad.  R low back pain > L.     Patient Stated Goals  Not to hurt. To be able to not to be able to not worry  about how long she is standing, not to worry about her hip hurting while she is sleeping, for her neck not to hurt when she looks around.     Currently in Pain?  Yes    Pain Score  6     Pain Onset  More than a month ago                               PT Education - 11/14/17 1708    Education provided  Yes    Education Details  ther-ex    [employer]) Educated  Patient    Methods  Explanation;Demonstration;Tactile cues;Verbal cues    Comprehension  Returned demonstration;Verbalized understanding         Objectives  Manual therapy  Supine R UPA toC2-C5 transverse process grade 3 to 3+ to decrease stiffness.  STM L rhomboid. Decreased neck pain with L cervical rotation     Ther-ex   Supine cervical rotation. No pain  Then with cervical rotation while maintaining nodding: increased contralateral posterior neck pulling sensation.  Supine cervical rotation 10x3 each way  Supine chin tuck with cervical extension to pillow targeting L cervical paraspinal muscles 10x3 with 5 second holds  Decreased discomfort with L cervical rotation in sitting  Seated L cervical rotation with L to R transverse pressure to C7 spinous process 10x3  L to R transverse pressure to C7 spinous process with R cervical rotation 2x. R posterior lateral neck pain  Seated press ups 10x 5 seconds for 2 sets. Decreased low back discomfort during exercise. Decreased neck pain with L cervical rotation.   Reviewed and given as part of her HEP. Pt demonstrated and verbalized understanding.    Improved exercise technique, movement at target joints, use of target muscles after min to mod verbal, visual, tactile cues.   Decreased neck pain with L cervical rotation following treatment to decrease L rhomboid and scalene muscle tension. Decreased low back pain with seated press-up exercise to decrease pressure. Minimal to no neck pain with supine cervical rotation compared to seated R  and L cervical rotation. Pt also demonstrates tendency for R cervical side bend posture. Improved P to A mobility to R cervical transverse processes palpated overall compared to previous sessions.  Pt tolerated session well without aggravation of symptoms.  PT Long Term Goals - 10/15/17 1840      PT LONG TERM GOAL #1   Title  Patient will have a decrease in R hip pain to 4/10 or less at worst to promote ability to perform funcitonal tasks, tolerate R S/L position.     Baseline  8/10 R hip pain at worst for the past 2 months (10/15/2017)    Time  8    Period  Weeks    Status  New    Target Date  12/13/17      PT LONG TERM GOAL #2   Title  Patient will have a decrease in neck pain to 4/10 or less at worst to promote ability to look around as well as work at her desk with less pain.     Baseline  8/10 at worst (10/15/2017)    Time  8    Period  Weeks    Status  New    Target Date  12/13/17      PT LONG TERM GOAL #3   Title  Patient will have a decrease in low back pain to 4/10 or less at worst to promote ability to tolerate standing for longer periods of time.     Baseline  8/10 back pain at worst (10/15/2017)    Time  8    Period  Weeks    Status  New    Target Date  12/13/17      PT LONG TERM GOAL #4   Title  Pt will improve her neck FOTO score by at least 8 points as a demonstration of improved function.     Baseline  65 (neck, 10/15/2017)    Time  8    Period  Weeks    Status  New    Target Date  12/13/17      PT LONG TERM GOAL #5   Title  Pt will improve her NDI score by at least 12% as a demonstration of improved function.     Baseline  30% (10/15/2017)    Time  8    Period  Weeks    Status  New    Target Date  12/13/17            Plan - 11/14/17 1732    Clinical Impression Statement  Decreased neck pain with L cervical rotation following treatment to decrease L rhomboid and scalene muscle tension. Decreased low back pain with seated press-up  exercise to decrease pressure. Minimal to no neck pain with supine cervical rotation compared to seated R and L cervical rotation. Pt also demonstrates tendency for R cervical side bend posture. Improved P to A mobility to R cervical transverse processes palpated overall compared to previous sessions.  Pt tolerated session well without aggravation of symptoms.     Rehab Potential  Fair    Clinical Impairments Affecting Rehab Potential  Chronicity of condition, multiple areas of pain, difficulty tolerating positions such as prolonged standing or R S/L     PT Frequency  2x / week    PT Duration  8 weeks    PT Treatment/Interventions  Neuromuscular re-education;Therapeutic exercise;Therapeutic activities;Manual techniques;Aquatic Therapy;Electrical Stimulation;Iontophoresis 4mg /ml Dexamethasone;Traction;Ultrasound;Patient/family education;Dry needling    PT Next Visit Plan  glute med and max strengthening, scapular strengthening, gentle extension, trunk strengthening, anterior cervical strengthening, posture, manual techniques, modalities PRN    Consulted and Agree with Plan of Care  Patient       Patient will benefit from skilled therapeutic intervention in  order to improve the following deficits and impairments:  Pain, Postural dysfunction, Improper body mechanics, Decreased strength  Visit Diagnosis: Muscle weakness (generalized)  Cervicalgia  Chronic bilateral low back pain, with sciatica presence unspecified     Problem List Patient Active Problem List   Diagnosis Date Noted  . Abnormal Pap smear of cervix 07/27/2017  . Vasomotor flushing 07/19/2017  . Depression 07/18/2017  . Nausea 06/25/2017  . Hip pain, right 06/25/2017  . OSA (obstructive sleep apnea) 04/18/2017  . Annual physical exam 04/10/2017  . Fatigue 12/16/2016  . Chronic back pain 05/30/2016  . Abdominal pain 03/12/2016  . Anxiety and depression 12/28/2015  . History of obstructive sleep apnea 12/15/2015  .  Thyroid nodule 12/15/2015  . GERD (gastroesophageal reflux disease) 12/15/2015  . Hyperlipidemia 12/15/2015  . Pain medication agreement signed 03/25/2015  . CAD in native artery 02/05/2015  . Right supraspinatus tenosynovitis 08/17/2014  . Crohn's disease involving terminal ileum ([place]) 03/30/2014  . FH: colon polyps 03/30/2014  . 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 Other bursitis disorders 07/12/2012  . Pain in joint, pelvic region and thigh 07/12/2012  . Crohn's disease ([place]) 01/23/2012    Joneen Boers PT, DPT   11/14/2017, 5:35 PM  [facility] [address] [place], [address] Phone: [phone]   Fax:  [phone]  Name: MAIYAH GOYNE MRN: [id number] Date of Birth: 02-Jan-1966

## 2017-11-14 NOTE — Patient Instructions (Signed)
  Seated press-ups  Sitting on a chair,    Press yourself up with your hands    Keep your shoulder blades back   Hold for 5 seconds    Repeat 10 times   Perform 3 sets daily.

## 2017-11-19 ENCOUNTER — Ambulatory Visit: Payer: Managed Care, Other (non HMO)

## 2017-11-19 DIAGNOSIS — M6281 Muscle weakness (generalized): Secondary | ICD-10-CM

## 2017-11-19 DIAGNOSIS — M542 Cervicalgia: Secondary | ICD-10-CM

## 2017-11-19 NOTE — Patient Instructions (Addendum)
  Seated scapular depression isometrics  Sitting on a chair with arms   Shoulder blades back   Press your right forearm onto arm rest for 5 to 10 seconds (medium level effort)    Repeat 10 times    Perform 3 sets daily.    Cervical side bend isometrics in side lying  Lying on your left side   Lift your head up to neutral   Hold for 5 seconds   Repeat 10 times   Perform 2-3 sets daily.

## 2017-11-19 NOTE — Therapy (Addendum)
[facility] [address], [place], [address] Phone: [phone]   Fax:  [phone]  Physical Therapy Treatment And Progress Report  Patient Details  Name: Carolyn Esparza MRN: [id number] Date of Birth: [birth year] Referring Provider: Orland Mustard, MD   Encounter Date: 11/19/2017  PT End of Session - 11/19/17 0806    Visit Number  11    Number of Visits  17    Date for PT Re-Evaluation  12/13/17    PT Start Time  0806    PT Stop Time  0852    PT Time Calculation (min)  46 min    Activity Tolerance  Patient tolerated treatment well    Behavior During Therapy  [facility] for tasks assessed/performed       Past Medical History:  Diagnosis Date  . Anxiety   . ASCUS with positive high risk HPV cervical   . CAD (coronary artery disease)   . Chicken pox   . Chronic pain    neck, back, b/l hips   . Depression   . GERD (gastroesophageal reflux disease)   . History of Crohn's disease   . Hyperlipidemia   . Left ovarian cyst    s/p removal of 1 ovary ? which one removed per pt   . Libido, decreased   . Thyroid nodule     Past Surgical History:  Procedure Laterality Date  . ABDOMINAL HYSTERECTOMY    . APPENDECTOMY  2004  . BLADDER SURGERY    . BREAST BIOPSY     x 2  . COLONOSCOPY    . OOPHORECTOMY     x1 ? which one removed per pt   . WISDOM TOOTH EXTRACTION      There were no vitals filed for this visit.  Subjective Assessment - 11/19/17 0807    Subjective  Neck is not as good as it has been. Took advil yesterday for her neck and hip. 6/10 neck currently. Used a hot pack which helped it calm down.  Also use salon pas with heat for L hip yesterday which helped. Did her exercises this weekend.  Felt muscle soreness at her lower trap after exercises which helped. Did not get much sleep last night because she was on call this weekend.  Was called for work at midnight, and at 2 am. Feels tired  today. Took a while to go to sleep after her 2 am phone call.  Also had a massage Thursday after PT. Friday and Saturday was good. No work Calls Saturday night. Neck started bothering her again Sunday morning.     Pertinent History  Had facet shots in her low back last month (steriod) which pt states that it put a band aid on it. Had back problems for about 15 years. Currenlty has a difficult time standing up from sitting. Also has neck pain since 3-4 months ago, gradual onset. 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  Pt sits in front of her computer all day. Adjusted computer monitor to eye level. Feels like stress and use of the phone (Iphone 8, uses her R ear predominantly but now uses an ear pod on L ear after having neck pain which helps with the talking on the phone) might play a factor.  Tried neck stretches such as cervical flexion, UT stretch and does not know if they help.  Pt states having L hand numbness when using her computer  mouse. 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  When L hand gets numb she feels cramping. Has to shake her L hand to get the feeling back (possible ulnar nerve distribution but pt unsure).  Neck and both hips bother her more currently since she had shots for her back recently. R hip pain bothers her more. Was told that she has bursitis. Had shots in her R hip years ago which temporarily helped. Now has a hard time laying on her R hip when sleeping. R hip pain runs laterally down her thigh to just proximal to her knee (along the ITband/L5 distribution). Has been using lidocaine patches on her hip from time to time.  The shots she had in her back helped with her hip pain temporarily.   Neck and hips bothered her the most even before her low back shots.   R hip bothers her more than her neck.  Sits on a chair with a lumbar support.    Pt states back pain does not go down her legs but it goes around her torso (underneath belly button) when it gets real bad.  R low back pain > L.     Patient Stated Goals  Not to hurt. To be able to not to be able to not worry about how long she is standing, not to worry about her hip hurting while she is sleeping, for her neck not to hurt when she looks around.     Currently in Pain?  Yes    Pain Score  6     Pain Onset  More than a month ago         [facility] PT Assessment - 11/19/17 0001      Observation/Other Assessments   Focus on Therapeutic Outcomes (FOTO)   59 (neck)    Neck Disability Index   38.0%                           PT Education - 11/19/17 0826    Education provided  Yes    Education Details  ther-ex, HEP    Person(s) Educated  Patient    Methods  Explanation;Demonstration;Tactile cues;Verbal cues;Handout    Comprehension  Returned demonstration;Verbalized understanding          Objectives  Ther-ex  Manually resisted R scapular depression isometrics 10x2 with 5 second holds  Then 10x10 seconds   Decreased R neck pain with R cervical rotation while performing scapular depression isometric  Supine serratus reach 3 lbs 10x5 seconds for 3 sets  Seated L cervical side bend stretch 5x15 seconds  R S/L L cervical side bend isometrics 10x5 seconds for 2 sets. Increased symptoms with R cervical rotation  L S/L R cervical side bend isometrics 10x5 seconds for 2 sets  Decreased R posterior lateral neck pain with R cervical rotation   Seated R cervical side bend stretch 5x15 seconds   No change  L S/L R cervical side bend isometrics 10x5 seconds more   Improved exercise technique, movement at target joints, use of target muscles after min to mod verbal, visual, tactile cues.   Decreased pain with R cervical rotation while activating R scapular depressor muscles and R lateral neck muscles. Decreased neck pain to 5/10 after session. Pt  demonstrates overall decreased neck, low back and R hip pain at worst since  initial evaluation. Pt has been making very good progress with decreasing neck pain until around 11/14/2017 when pt reported increased neck pain after turning her back to the L while at her tub and felt a cramp at her mid back which may play a factor in decreased function scores with FOTO and Quick Dash today. Pt sill demonstrates neck, back, and hip pain, weakness and difficulty performing functional tasks and would benefit from continued skilled physical therapy services to address the aforementioned deficits.         PT Long Term Goals - 11/19/17 1025      PT LONG TERM GOAL #1   Title  Patient will have a decrease in R hip pain to 4/10 or less at worst to promote ability to perform funcitonal tasks, tolerate R S/L position.     Baseline  8/10 R hip pain at worst for the past 2 months (10/15/2017); 6-7/10 R hip pain at most (not as much pain as it has been) for the past 7 days 11/19/2017.     Time  8    Period  Weeks    Status  On-going    Target Date  12/13/17      PT LONG TERM GOAL #2   Title  Patient will have a decrease in neck pain to 4/10 or less at worst to promote ability to look around as well as work at her desk with less pain.     Baseline  8/10 at worst (10/15/2017); 6/10 (11/19/2017)    Time  8    Period  Weeks    Status  On-going    Target Date  12/13/17      PT LONG TERM GOAL #3   Title  Patient will have a decrease in low back pain to 4/10 or less at worst to promote ability to tolerate standing for longer periods of time.     Baseline  8/10 back pain at worst (10/15/2017); 5/10 at most for the past 7 days (11/19/2017)    Time  8    Period  Weeks    Status  On-going    Target Date  12/13/17      PT LONG TERM GOAL #4   Title  Pt will improve her neck FOTO score by at least 8 points as a demonstration of improved function.     Baseline  65 (neck, 10/15/2017); 59 (neck, 11/19/2017)    Time  8     Period  Weeks    Status  On-going    Target Date  12/13/17      PT LONG TERM GOAL #5   Title  Pt will improve her NDI score by at least 12% as a demonstration of improved function.     Baseline  30% (10/15/2017); 38% (11/19/2017)    Time  8    Period  Weeks    Status  On-going    Target Date  12/13/17            Plan - 11/19/17 0827    Clinical Impression Statement  Decreased pain with R cervical rotation while activating R scapular depressor muscles and R lateral neck muscles. Decreased neck pain to 5/10 after session. Pt demonstrates overall decreased neck, low back and R hip pain at worst since initial evaluation. Pt has been making very good progress with decreasing neck pain until around 11/14/2017 when pt reported increased neck pain after turning her back to the L while at her tub and felt  a cramp at her mid back which may play a factor in decreased function scores with FOTO and Quick Dash today. Pt sill demonstrates neck, back, and hip pain, weakness and difficulty performing functional tasks and would benefit from continued skilled physical therapy services to address the aforementioned deficits.     Rehab Potential  Fair    Clinical Impairments Affecting Rehab Potential  Chronicity of condition, multiple areas of pain, difficulty tolerating positions such as prolonged standing or R S/L     PT Frequency  2x / week    PT Duration  8 weeks    PT Treatment/Interventions  Neuromuscular re-education;Therapeutic exercise;Therapeutic activities;Manual techniques;Aquatic Therapy;Electrical Stimulation;Iontophoresis 4mg /ml Dexamethasone;Traction;Ultrasound;Patient/family education;Dry needling    PT Next Visit Plan  glute med and max strengthening, scapular strengthening, gentle extension, trunk strengthening, anterior cervical strengthening, posture, manual techniques, modalities PRN    Consulted and Agree with Plan of Care  Patient       Patient will benefit from skilled therapeutic  intervention in order to improve the following deficits and impairments:  Pain, Postural dysfunction, Improper body mechanics, Decreased strength  Visit Diagnosis: Muscle weakness (generalized)  Cervicalgia     Problem List Patient Active Problem List   Diagnosis Date Noted  . Abnormal Pap smear of cervix 07/27/2017  . Vasomotor flushing 07/19/2017  . Depression 07/18/2017  . Nausea 06/25/2017  . Hip pain, right 06/25/2017  . OSA (obstructive sleep apnea) 04/18/2017  . Annual physical exam 04/10/2017  . Fatigue 12/16/2016  . Chronic back pain 05/30/2016  . Abdominal pain 03/12/2016  . Anxiety and depression 12/28/2015  . History of obstructive sleep apnea 12/15/2015  . Thyroid nodule 12/15/2015  . GERD (gastroesophageal reflux disease) 12/15/2015  . Hyperlipidemia 12/15/2015  . Pain medication agreement signed 03/25/2015  . CAD in native artery 02/05/2015  . Right supraspinatus tenosynovitis 08/17/2014  . Crohn's disease involving terminal ileum ([place]) 03/30/2014  . FH: colon polyps 03/30/2014  . 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 Other bursitis disorders 07/12/2012  . Pain in joint, pelvic region and thigh 07/12/2012  . Crohn's disease ([place]) 01/23/2012   Thank you for your referral.  Joneen Boers PT, DPT   11/19/2017, 6:50 PM  [facility] [address] [place], [address] Phone: [phone]   Fax:  [phone]  Name: AIRIS BARBEE MRN: [id number] Date of Birth: 12-Nov-1965

## 2017-11-21 ENCOUNTER — Ambulatory Visit: Payer: Managed Care, Other (non HMO)

## 2017-11-21 DIAGNOSIS — G8929 Other chronic pain: Secondary | ICD-10-CM

## 2017-11-21 DIAGNOSIS — M25552 Pain in left hip: Secondary | ICD-10-CM

## 2017-11-21 DIAGNOSIS — M6281 Muscle weakness (generalized): Secondary | ICD-10-CM

## 2017-11-21 DIAGNOSIS — M542 Cervicalgia: Secondary | ICD-10-CM | POA: Diagnosis not present

## 2017-11-21 DIAGNOSIS — M545 Low back pain: Secondary | ICD-10-CM

## 2017-11-21 DIAGNOSIS — M25551 Pain in right hip: Secondary | ICD-10-CM

## 2017-11-21 NOTE — Therapy (Signed)
[facility] [address] [place], [address] Phone: [phone]   Fax:  [phone]  Physical Therapy Treatment  Patient Details  Name: Carolyn Esparza MRN: [id number] Date of Birth: August 14, 1965 Referring Provider: Orland Mustard, MD   Encounter Date: 11/21/2017  PT End of Session - 11/21/17 1706    Visit Number  12    Number of Visits  17    Date for PT Re-Evaluation  12/13/17    PT Start Time  1706    PT Stop Time  1752    PT Time Calculation (min)  46 min    Activity Tolerance  Patient tolerated treatment well    Behavior During Therapy  [facility] for tasks assessed/performed       Past Medical History:  Diagnosis Date  . Anxiety   . ASCUS with positive high risk HPV cervical   . CAD (coronary artery disease)   . Chicken pox   . Chronic pain    neck, back, b/l hips   . Depression   . GERD (gastroesophageal reflux disease)   . History of Crohn's disease   . Hyperlipidemia   . Left ovarian cyst    s/p removal of 1 ovary ? which one removed per pt   . Libido, decreased   . Thyroid nodule     Past Surgical History:  Procedure Laterality Date  . ABDOMINAL HYSTERECTOMY    . APPENDECTOMY  2004  . BLADDER SURGERY    . BREAST BIOPSY     x 2  . COLONOSCOPY    . OOPHORECTOMY     x1 ? which one removed per pt   . WISDOM TOOTH EXTRACTION      There were no vitals filed for this visit.  Subjective Assessment - 11/21/17 1707    Subjective  Neck feels better than it did yesterday. 5/10 currently. Yesterday was a 6-7/10. Some days she feels better, some days she does not. Was good most of the day after last session.  Back is about a 6/10 today. Back gave her a fit this morning.  5/10 R hip currently. Hip seems to be worse when she wakes up in the mornings, the whole hip is just aching.     Pertinent History  Had facet shots in her low back last month (steriod) which pt states that it put a band aid  on it. Had back problems for about 15 years. Currenlty has a difficult time standing up from sitting. Also has neck pain since 3-4 months ago, gradual onset. 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  Pt sits in front of her computer all day. Adjusted computer monitor to eye level. Feels like stress and use of the phone (Iphone 8, uses her R ear predominantly but now uses an ear pod on L ear after having neck pain which helps with the talking on the phone) might play a factor.  Tried neck stretches such as cervical flexion, UT stretch and does not know if they help.  Pt states having L hand numbness when using her computer mouse. 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  When L hand gets numb she feels cramping. Has to shake her L hand to get the feeling back (possible ulnar nerve distribution but pt unsure).  Neck and  both hips bother her more currently since she had shots for her back recently. R hip pain bothers her more. Was told that she has bursitis. Had shots in her R hip years ago which temporarily helped. Now has a hard time laying on her R hip when sleeping. R hip pain runs laterally down her thigh to just proximal to her knee (along the ITband/L5 distribution). Has been using lidocaine patches on her hip from time to time.  The shots she had in her back helped with her hip pain temporarily.   Neck and hips bothered her the most even before her low back shots.   R hip bothers her more than her neck.  Sits on a chair with a lumbar support.   Pt states back pain does not go down her legs but it goes around her torso (underneath belly button) when it gets real bad.  R low back pain > L.     Patient Stated Goals  Not to hurt. To be able to not to be able to not worry about how long she is standing, not to worry about her hip hurting while she is sleeping, for  her neck not to hurt when she looks around.     Currently in Pain?  Yes    Pain Score  5     Pain Onset  More than a month ago                               PT Education - 11/21/17 1740    Education provided  Yes    Education Details  ther-ex, HEP    Person(s) Educated  Patient    Methods  Explanation;Demonstration;Tactile cues;Verbal cues;Handout    Comprehension  Returned demonstration;Verbalized understanding         Objectives  Next MD appt is on Monday 11/26/2017   Manual therapy  Supine manual cervical traction. Decreased pain with with cervical rotation and flexion  Supine R UPA to C3 and C4 grade 3 to 3+ to promote mobility  Neck is sore afterwards  Supine manual cervical traction again. Slight decrease in soreness.     Ther-ex   Supine L hip extension isometrics (in L SKTC position) 10x5 seconds for 2 sets  Supine SLR R hip flexion 10x3  Supine bridge with bilateral ankle DF 10x3  Prone on elbows to promote gentle back extension (secondary to bilateral hip tightness with repeated flexion test) x 2 minutes.  Decreased low back tightness.    Improved exercise technique, movement at target joints, use of target muscles after mod verbal, visual, tactile cues.    Decreased neck pain with cervical rotation and with flexion with treatment to decrease pressure to cervical joints. Worked on L glute max and R hip flexor strengthening to promote proper positioning of low back and pelvis. Pt tolerated session well without aggravation of symptoms. Decreased low back tightness with gentle back extension.       PT Long Term Goals - 11/19/17 0973      PT LONG TERM GOAL #1   Title  Patient will have a decrease in R hip pain to 4/10 or less at worst to promote ability to perform funcitonal tasks, tolerate R S/L position.     Baseline  8/10 R hip pain at worst for the past 2 months (10/15/2017); 6-7/10 R hip pain at most (not as much pain  as it has been) for the  past 7 days 11/19/2017.     Time  8    Period  Weeks    Status  On-going    Target Date  12/13/17      PT LONG TERM GOAL #2   Title  Patient will have a decrease in neck pain to 4/10 or less at worst to promote ability to look around as well as work at her desk with less pain.     Baseline  8/10 at worst (10/15/2017); 6/10 (11/19/2017)    Time  8    Period  Weeks    Status  On-going    Target Date  12/13/17      PT LONG TERM GOAL #3   Title  Patient will have a decrease in low back pain to 4/10 or less at worst to promote ability to tolerate standing for longer periods of time.     Baseline  8/10 back pain at worst (10/15/2017); 5/10 at most for the past 7 days (11/19/2017)    Time  8    Period  Weeks    Status  On-going    Target Date  12/13/17      PT LONG TERM GOAL #4   Title  Pt will improve her neck FOTO score by at least 8 points as a demonstration of improved function.     Baseline  65 (neck, 10/15/2017); 59 (neck, 11/19/2017)    Time  8    Period  Weeks    Status  On-going    Target Date  12/13/17      PT LONG TERM GOAL #5   Title  Pt will improve her NDI score by at least 12% as a demonstration of improved function.     Baseline  30% (10/15/2017); 38% (11/19/2017)    Time  8    Period  Weeks    Status  On-going    Target Date  12/13/17            Plan - 11/21/17 1730    Clinical Impression Statement  Decreased neck pain with cervical rotation and with flexion with treatment to decrease pressure to cervical joints. Worked on L glute max and R hip flexor strengthening to promote proper positioning of low back and pelvis. Pt tolerated session well without aggravation of symptoms. Decreased low back tightness with gentle back extension.     Rehab Potential  Fair    Clinical Impairments Affecting Rehab Potential  Chronicity of condition, multiple areas of pain, difficulty tolerating positions such as prolonged standing or R S/L     PT Frequency  2x  / week    PT Duration  8 weeks    PT Treatment/Interventions  Neuromuscular re-education;Therapeutic exercise;Therapeutic activities;Manual techniques;Aquatic Therapy;Electrical Stimulation;Iontophoresis 4mg /ml Dexamethasone;Traction;Ultrasound;Patient/family education;Dry needling    PT Next Visit Plan  glute med and max strengthening, scapular strengthening, gentle extension, trunk strengthening, anterior cervical strengthening, posture, manual techniques, modalities PRN    Consulted and Agree with Plan of Care  Patient       Patient will benefit from skilled therapeutic intervention in order to improve the following deficits and impairments:  Pain, Postural dysfunction, Improper body mechanics, Decreased strength  Visit Diagnosis: Muscle weakness (generalized)  Cervicalgia  Chronic bilateral low back pain, with sciatica presence unspecified  Pain in right hip  Pain in left hip     Problem List Patient Active Problem List   Diagnosis Date Noted  . Abnormal Pap smear of cervix 07/27/2017  . Vasomotor flushing  07/19/2017  . Depression 07/18/2017  . Nausea 06/25/2017  . Hip pain, right 06/25/2017  . OSA (obstructive sleep apnea) 04/18/2017  . Annual physical exam 04/10/2017  . Fatigue 12/16/2016  . Chronic back pain 05/30/2016  . Abdominal pain 03/12/2016  . Anxiety and depression 12/28/2015  . History of obstructive sleep apnea 12/15/2015  . Thyroid nodule 12/15/2015  . GERD (gastroesophageal reflux disease) 12/15/2015  . Hyperlipidemia 12/15/2015  . Pain medication agreement signed 03/25/2015  . CAD in native artery 02/05/2015  . Right supraspinatus tenosynovitis 08/17/2014  . Crohn's disease involving terminal ileum ([place]) 03/30/2014  . FH: colon polyps 03/30/2014  . 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 Other bursitis disorders 07/12/2012  . Pain in joint, pelvic region and thigh 07/12/2012  . Crohn's disease ([place]) 01/23/2012    Joneen Boers PT, DPT    11/21/2017, 8:18 PM  [facility] [address] [place], [address] Phone: [phone]   Fax:  [phone]  Name: Carolyn Esparza MRN: [id number] Date of Birth: 1965-09-02

## 2017-11-21 NOTE — Patient Instructions (Signed)
Prone on elbows  Lie down on your stomach, toes in, and prop yourself up onto your elbows   Take deep breaths for 2 minutes   Repeat 3 times daily.

## 2017-11-26 ENCOUNTER — Ambulatory Visit: Payer: Managed Care, Other (non HMO)

## 2017-11-26 ENCOUNTER — Ambulatory Visit: Payer: Managed Care, Other (non HMO) | Admitting: Internal Medicine

## 2017-11-26 ENCOUNTER — Encounter: Payer: Self-pay | Admitting: Internal Medicine

## 2017-11-26 VITALS — BP 118/70 | HR 71 | Temp 97.5°F | Resp 18 | Ht 63.0 in | Wt 174.4 lb

## 2017-11-26 DIAGNOSIS — G8929 Other chronic pain: Secondary | ICD-10-CM

## 2017-11-26 DIAGNOSIS — Z23 Encounter for immunization: Secondary | ICD-10-CM

## 2017-11-26 DIAGNOSIS — M542 Cervicalgia: Secondary | ICD-10-CM | POA: Diagnosis not present

## 2017-11-26 DIAGNOSIS — M25552 Pain in left hip: Secondary | ICD-10-CM

## 2017-11-26 DIAGNOSIS — Z780 Asymptomatic menopausal state: Secondary | ICD-10-CM | POA: Diagnosis not present

## 2017-11-26 DIAGNOSIS — E559 Vitamin D deficiency, unspecified: Secondary | ICD-10-CM

## 2017-11-26 DIAGNOSIS — R1011 Right upper quadrant pain: Secondary | ICD-10-CM

## 2017-11-26 DIAGNOSIS — M545 Low back pain: Secondary | ICD-10-CM

## 2017-11-26 DIAGNOSIS — R748 Abnormal levels of other serum enzymes: Secondary | ICD-10-CM

## 2017-11-26 DIAGNOSIS — K769 Liver disease, unspecified: Secondary | ICD-10-CM

## 2017-11-26 DIAGNOSIS — M6281 Muscle weakness (generalized): Secondary | ICD-10-CM

## 2017-11-26 DIAGNOSIS — M25551 Pain in right hip: Secondary | ICD-10-CM

## 2017-11-26 HISTORY — DX: Asymptomatic menopausal state: Z78.0

## 2017-11-26 HISTORY — DX: Right upper quadrant pain: R10.11

## 2017-11-26 NOTE — Therapy (Signed)
[facility] [address] [place], [address] Phone: [phone]   Fax:  [phone]  Physical Therapy Treatment  Patient Details  Name: Carolyn Esparza MRN: [id number] Date of Birth: 06-29-1966 Referring Provider: Orland Mustard, MD   Encounter Date: 11/26/2017  PT End of Session - 11/26/17 1750    Visit Number  13    Number of Visits  17    Date for PT Re-Evaluation  12/13/17    PT Start Time  1750    PT Stop Time  1837    PT Time Calculation (min)  47 min    Activity Tolerance  Patient tolerated treatment well    Behavior During Therapy  [facility] for tasks assessed/performed       Past Medical History:  Diagnosis Date  . Anxiety   . ASCUS with positive high risk HPV cervical   . CAD (coronary artery disease)   . Chicken pox   . Chronic pain    neck, back, b/l hips   . Depression   . GERD (gastroesophageal reflux disease)   . History of Crohn's disease   . Hyperlipidemia   . Left ovarian cyst    s/p removal of 1 ovary ? which one removed per pt   . Libido, decreased   . Thyroid nodule     Past Surgical History:  Procedure Laterality Date  . ABDOMINAL HYSTERECTOMY    . APPENDECTOMY  2004  . BLADDER SURGERY    . BREAST BIOPSY     x 2  . COLONOSCOPY    . OOPHORECTOMY     x1 ? which one removed per pt   . WISDOM TOOTH EXTRACTION      There were no vitals filed for this visit.  Subjective Assessment - 11/26/17 1751    Subjective  Pt states neck has not been too good today and yesterday. Feels like a tighness and pulling in her back this morning which usually eases off. Walked on the treadmill for 20 min Saturday and did more of an incline and her hips were bothering her afterwards for the afternoon.  7/10 neck pain currently.  5/10 low back pain currenty, 3/10 R hip, 0/10 L hip currently.     Pertinent History  Had facet shots in her low back last month (steriod) which pt states that it  put a band aid on it. Had back problems for about 15 years. Currenlty has a difficult time standing up from sitting. Also has neck pain since 3-4 months ago, gradual onset. 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  Pt sits in front of her computer all day. Adjusted computer monitor to eye level. Feels like stress and use of the phone (Iphone 8, uses her R ear predominantly but now uses an ear pod on L ear after having neck pain which helps with the talking on the phone) might play a factor.  Tried neck stretches such as cervical flexion, UT stretch and does not know if they help.  Pt states having L hand numbness when using her computer mouse. 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  When L hand gets numb she feels cramping. Has to shake her L hand to get the feeling back (possible ulnar nerve distribution but pt unsure).  Neck and both  hips bother her more currently since she had shots for her back recently. R hip pain bothers her more. Was told that she has bursitis. Had shots in her R hip years ago which temporarily helped. Now has a hard time laying on her R hip when sleeping. R hip pain runs laterally down her thigh to just proximal to her knee (along the ITband/L5 distribution). Has been using lidocaine patches on her hip from time to time.  The shots she had in her back helped with her hip pain temporarily.   Neck and hips bothered her the most even before her low back shots.   R hip bothers her more than her neck.  Sits on a chair with a lumbar support.   Pt states back pain does not go down her legs but it goes around her torso (underneath belly button) when it gets real bad.  R low back pain > L.     Patient Stated Goals  Not to hurt. To be able to not to be able to not worry about how long she is standing, not to worry about her hip hurting while she is  sleeping, for her neck not to hurt when she looks around.     Currently in Pain?  Yes    Pain Score  7     Pain Onset  More than a month ago                               PT Education - 11/26/17 1759    Education provided  Yes    Education Details  ther-ex, HEP    Person(s) Educated  Patient    Methods  Explanation;Demonstration;Tactile cues;Verbal cues;Handout    Comprehension  Verbalized understanding;Returned demonstration         Objectives    Ther-ex  Prone on elbows to promote gentle back extension (secondary to bilateral hip tightness with repeated flexion test) x 1 minutes.  Prone press-ups 10x2  Standing back extension to the R 10x5 seconds for 2 sets  R S/L bows and arrows (to promote L trunk rotation to simulate R UPA to R lumbar transverse processes) 10x3  Supine bilateral shoulder flexion to promote thoracic extension 10x5 seconds for 2 sets  Supine chin tucks 10x5 seconds for 2 sets to promote anterior cervical muscle activation.   Improved exercise technique, movement at target joints, use of target muscles after mod verbal, visual, tactile cues.     Manual therapy  Prone R UPA to L5, L4, L3 transverse processes. Decreased low back pulling sensation   Supine STM to cervical paraspinal muscles     Decreased R low back symptoms with manual therapy to promote mobility to R L3 to L5. Decreased neck pain to 5/10 after treatment to promote upper throracic extension and anterior cervical muscle activation.       PT Long Term Goals - 11/19/17 2423      PT LONG TERM GOAL #1   Title  Patient will have a decrease in R hip pain to 4/10 or less at worst to promote ability to perform funcitonal tasks, tolerate R S/L position.     Baseline  8/10 R hip pain at worst for the past 2 months (10/15/2017); 6-7/10 R hip pain at most (not as much pain as it has been) for the past 7 days 11/19/2017.     Time  8    Period  Weeks  Status  On-going    Target Date  12/13/17      PT LONG TERM GOAL #2   Title  Patient will have a decrease in neck pain to 4/10 or less at worst to promote ability to look around as well as work at her desk with less pain.     Baseline  8/10 at worst (10/15/2017); 6/10 (11/19/2017)    Time  8    Period  Weeks    Status  On-going    Target Date  12/13/17      PT LONG TERM GOAL #3   Title  Patient will have a decrease in low back pain to 4/10 or less at worst to promote ability to tolerate standing for longer periods of time.     Baseline  8/10 back pain at worst (10/15/2017); 5/10 at most for the past 7 days (11/19/2017)    Time  8    Period  Weeks    Status  On-going    Target Date  12/13/17      PT LONG TERM GOAL #4   Title  Pt will improve her neck FOTO score by at least 8 points as a demonstration of improved function.     Baseline  65 (neck, 10/15/2017); 59 (neck, 11/19/2017)    Time  8    Period  Weeks    Status  On-going    Target Date  12/13/17      PT LONG TERM GOAL #5   Title  Pt will improve her NDI score by at least 12% as a demonstration of improved function.     Baseline  30% (10/15/2017); 38% (11/19/2017)    Time  8    Period  Weeks    Status  On-going    Target Date  12/13/17            Plan - 11/26/17 1751    Clinical Impression Statement  Decreased R low back symptoms with manual therapy to promote mobility to R L3 to L5. Decreased neck pain to 5/10 after treatment to promote upper throracic extension and anterior cervical muscle activation.     Rehab Potential  Fair    Clinical Impairments Affecting Rehab Potential  Chronicity of condition, multiple areas of pain, difficulty tolerating positions such as prolonged standing or R S/L     PT Frequency  2x / week    PT Duration  8 weeks    PT Treatment/Interventions  Neuromuscular re-education;Therapeutic exercise;Therapeutic activities;Manual techniques;Aquatic Therapy;Electrical Stimulation;Iontophoresis 4mg /ml  Dexamethasone;Traction;Ultrasound;Patient/family education;Dry needling    PT Next Visit Plan  glute med and max strengthening, scapular strengthening, gentle extension, trunk strengthening, anterior cervical strengthening, posture, manual techniques, modalities PRN    Consulted and Agree with Plan of Care  Patient       Patient will benefit from skilled therapeutic intervention in order to improve the following deficits and impairments:  Pain, Postural dysfunction, Improper body mechanics, Decreased strength  Visit Diagnosis: Muscle weakness (generalized)  Cervicalgia  Chronic bilateral low back pain, with sciatica presence unspecified  Pain in right hip  Pain in left hip     Problem List Patient Active Problem List   Diagnosis Date Noted  . Menopause 11/26/2017  . Right upper quadrant pain 11/26/2017  . Abnormal Pap smear of cervix 07/27/2017  . Vasomotor flushing 07/19/2017  . Depression 07/18/2017  . Nausea 06/25/2017  . Hip pain, right 06/25/2017  . OSA (obstructive sleep apnea) 04/18/2017  . Annual physical exam 04/10/2017  .  Fatigue 12/16/2016  . Chronic back pain 05/30/2016  . Abdominal pain 03/12/2016  . Anxiety and depression 12/28/2015  . History of obstructive sleep apnea 12/15/2015  . Thyroid nodule 12/15/2015  . GERD (gastroesophageal reflux disease) 12/15/2015  . Hyperlipidemia 12/15/2015  . Pain medication agreement signed 03/25/2015  . CAD in native artery 02/05/2015  . Right supraspinatus tenosynovitis 08/17/2014  . Crohn's disease involving terminal ileum ([place]) 03/30/2014  . FH: colon polyps 03/30/2014  . 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 Other bursitis disorders 07/12/2012  . Pain in joint, pelvic region and thigh 07/12/2012  . Crohn's disease ([place]) 01/23/2012    Joneen Boers PT, DPT   11/26/2017, 6:46 PM  [facility] [address], [place], [address] Phone:  [phone]   Fax:  [phone]  Name: Carolyn Esparza MRN: [id number] Date of Birth: 03-Apr-1966

## 2017-11-26 NOTE — Progress Notes (Signed)
Pre visit review using our clinic review tool, if applicable. No additional management support is needed unless otherwise documented below in the visit note. 

## 2017-11-26 NOTE — Patient Instructions (Signed)
Please follow up in 3 months  We will order MRI abdomen today and HIDA sacn  3rd hep B vaccine due 04/27/18 and shingrix before 05/28/18  Take care Hepatitis B Vaccine, Recombinant injection What is this medicine? HEPATITIS B VACCINE (hep uh TAHY tis B VAK seen) is a vaccine. It is used to prevent an infection with the hepatitis B virus. This medicine may be used for other purposes; ask your health care provider or pharmacist if you have questions. COMMON BRAND NAME(S): Engerix-B, Recombivax HB What should I tell my health care provider before I take this medicine? They need to know if you have any of these conditions: -fever, infection -heart disease -hepatitis B infection -immune system problems -kidney disease -an unusual or allergic reaction to vaccines, yeast, other medicines, foods, dyes, or preservatives -pregnant or trying to get pregnant -breast-feeding How should I use this medicine? This vaccine is for injection into a muscle. It is given by a health care professional. A copy of Vaccine Information Statements will be given before each vaccination. Read this sheet carefully each time. The sheet may change frequently. Talk to your pediatrician regarding the use of this medicine in children. While this drug may be prescribed for children as young as newborn for selected conditions, precautions do apply. Overdosage: If you think you have taken too much of this medicine contact a poison control center or emergency room at once. NOTE: This medicine is only for you. Do not share this medicine with others. What if I miss a dose? It is important not to miss your dose. Call your doctor or health care professional if you are unable to keep an appointment. What may interact with this medicine? -medicines that suppress your immune function like adalimumab, anakinra, infliximab -medicines to treat cancer -steroid medicines like prednisone or cortisone This list may not describe all  possible interactions. Give your health care provider a list of all the medicines, herbs, non-prescription drugs, or dietary supplements you use. Also tell them if you smoke, drink alcohol, or use illegal drugs. Some items may interact with your medicine. What should I watch for while using this medicine? See your health care provider for all shots of this vaccine as directed. You must have 3 shots of this vaccine for protection from hepatitis B infection. Tell your doctor right away if you have any serious or unusual side effects after getting this vaccine. What side effects may I notice from receiving this medicine? Side effects that you should report to your doctor or health care professional as soon as possible: -allergic reactions like skin rash, itching or hives, swelling of the face, lips, or tongue -breathing problems -confused, irritated -fast, irregular heartbeat -flu-like syndrome -numb, tingling pain -seizures -unusually weak or tired Side effects that usually do not require medical attention (report to your doctor or health care professional if they continue or are bothersome): -diarrhea -fever -headache -loss of appetite -muscle pain -nausea -pain, redness, swelling, or irritation at site where injected -tiredness This list may not describe all possible side effects. Call your doctor for medical advice about side effects. You may report side effects to FDA at 1-800-FDA-1088. Where should I keep my medicine? This drug is given in a hospital or clinic and will not be stored at home. NOTE: This sheet is a summary. It may not cover all possible information. If you have questions about this medicine, talk to your doctor, pharmacist, or health care provider.  2018 Elsevier/Gold Standard (2013-11-17 13:26:01)  Abdominal  Pain, Adult Abdominal pain can be caused by many things. Often, abdominal pain is not serious and it gets better with no treatment or by being treated at home.  However, sometimes abdominal pain is serious. Your health care provider will do a medical history and a physical exam to try to determine the cause of your abdominal pain. Follow these instructions at home:  Take over-the-counter and prescription medicines only as told by your health care provider. Do not take a laxative unless told by your health care provider.  Drink enough fluid to keep your urine clear or pale yellow.  Watch your condition for any changes.  Keep all follow-up visits as told by your health care provider. This is important. Contact a health care provider if:  Your abdominal pain changes or gets worse.  You are not hungry or you lose weight without trying.  You are constipated or have diarrhea for more than 2-3 days.  You have pain when you urinate or have a bowel movement.  Your abdominal pain wakes you up at night.  Your pain gets worse with meals, after eating, or with certain foods.  You are throwing up and cannot keep anything down.  You have a fever. Get help right away if:  Your pain does not go away as soon as your health care provider told you to expect.  You cannot stop throwing up.  Your pain is only in areas of the abdomen, such as the right side or the left lower portion of the abdomen.  You have bloody or black stools, or stools that look like tar.  You have severe pain, cramping, or bloating in your abdomen.  You have signs of dehydration, such as: ? Dark urine, very little urine, or no urine. ? Cracked lips. ? Dry mouth. ? Sunken eyes. ? Sleepiness. ? Weakness. This information is not intended to replace advice given to you by your health care provider. Make sure you discuss any questions you have with your health care provider. Document Released: 04/26/2005 Document Revised: 02/04/2016 Document Reviewed: 12/29/2015 Elsevier Interactive Patient Education  [employer].

## 2017-11-26 NOTE — Patient Instructions (Signed)
Supine bilateral shoulder flexion   Lying on your back   Raise both arms up and back to feel a gentle extension at your thoracic spine   Hold for 5 seconds   Repeat 10 times   Perform 3 sets daily

## 2017-11-26 NOTE — Progress Notes (Signed)
Chief Complaint  Patient presents with  . Follow-up    X-rays last time she was here, FU and ER visit, for gallbladder like symptoms.   F/u  1. ED visit 10/09/17 for RUQ ab pain and abnormal lfts [place] +liver lesion ~3 cm unable to characterized and rec further imaging MRI with and w/o or CT or repeat US in 1 year  RUQ pain intermittent now and worse with fried food though pain has reduced in freq. [place] 10/09/17 + GB sludge w/o gallstones and liver lesions. She has tried Maalox and Pepcid w/o relief   2. Chronic pain  -neck now with right neck pain left and midline neck pain improved with PT,  -b/l hip pain improved with PT and mattress toper  -lower back pain improved since 08/2017 after 3 SI Jt injections   3. Hot flashes she is taking Black Cohosh rec she stop she did stop estradiol and did not start clonidine patch   Review of Systems  Constitutional: Negative for weight loss.  HENT: Negative for hearing loss.   Eyes: Negative for blurred vision.  Respiratory: Negative for cough.   Cardiovascular: Negative for chest pain.  Gastrointestinal: Positive for abdominal pain. Negative for nausea and vomiting.  Musculoskeletal: Positive for neck pain. Negative for back pain and joint pain.  Skin: Negative for rash.  Neurological: Negative for headaches.  Psychiatric/Behavioral: Negative for depression.   Past Medical History:  Diagnosis Date  . Anxiety   . ASCUS with positive high risk HPV cervical   . CAD (coronary artery disease)   . Chicken pox   . Chronic pain    neck, back, b/l hips   . Depression   . GERD (gastroesophageal reflux disease)   . History of Crohn's disease   . Hyperlipidemia   . Left ovarian cyst    s/p removal of 1 ovary ? which one removed per pt   . Libido, decreased   . Thyroid nodule    Past Surgical History:  Procedure Laterality Date  . ABDOMINAL HYSTERECTOMY    . APPENDECTOMY  2004  . BLADDER SURGERY    . BREAST BIOPSY     x 2  . COLONOSCOPY    .  OOPHORECTOMY     x1 ? which one removed per pt   . WISDOM TOOTH EXTRACTION     Family History  Problem Relation Age of Onset  . Alcohol abuse Mother   . Hyperlipidemia Mother   . Heart disease Mother   . Hypertension Mother   . Diabetes Mother   . Crohn's disease Sister        1/2 sister  . Depression Sister   . Leukemia Paternal Grandmother   . Cancer Paternal Aunt        blood, type unknown   Social History   Socioeconomic History  . Marital status: Married    Spouse name: douglas  . Number of children: 0  . Years of education: Not on file  . Highest education level: Associate degree: occupational, Hotel manager, or vocational program  Occupational History  . Occupation: Public relations account executive    Comment: full time  Social Needs  . Financial resource strain: Not hard at all  . Food insecurity:    Worry: Never true    Inability: Never true  . Transportation needs:    Medical: No    Non-medical: No  Tobacco Use  . Smoking status: Former Smoker    Types: Cigarettes    Last attempt to quit:  08/29/2000    Years since quitting: 17.2  . Smokeless tobacco: Never Used  Substance and Sexual Activity  . Alcohol use: No    Alcohol/week: 0.0 oz    Comment: social   . Drug use: No  . Sexual activity: Not Currently  Lifestyle  . Physical activity:    Days per week: 2 days    Minutes per session: 20 min  . Stress: Only a little  Relationships  . Social connections:    Talks on phone: Never    Gets together: Never    Attends religious service: Never    Active member of club or organization: No    Attends meetings of clubs or organizations: Never    Relationship status: Married  . Intimate partner violence:    Fear of current or ex partner: No    Emotionally abused: No    Physically abused: No    Forced sexual activity: No  Other Topics Concern  . Not on file  Social History Narrative   Married    Works [employer]    Current Meds  Medication Sig  . Ascorbic Acid (VITAMIN C)  1000 MG tablet Take 1,000 mg by mouth daily.   Marland Kitchen aspirin EC 81 MG tablet Take 81 mg by mouth daily.  Marland Kitchen atorvastatin (LIPITOR) 10 MG tablet Take 1 tablet (10 mg total) by mouth daily.  Marland Kitchen buPROPion (WELLBUTRIN XL) 300 MG 24 hr tablet TAKE 1 TABLET BY MOUTH  DAILY  . docusate sodium (DOK) 100 MG capsule TAKE 1 CAPSULE(100 MG) BY MOUTH TWICE DAILY  . fentaNYL (DURAGESIC - DOSED MCG/HR) 25 MCG/HR patch Place 25 mcg onto the skin every 3 (three) days.   Marland Kitchen HYDROcodone-acetaminophen (NORCO) 10-325 MG tablet Take 1 tablet by mouth every 6 (six) hours as needed.   . lidocaine (LIDODERM) 5 % Place 2 patches onto the skin daily.   . Multiple Vitamin (MULTI-VITAMINS) TABS Take 1 tablet by mouth daily.   . Omega-3 Fatty Acids (FISH OIL PO) Take 1 capsule by mouth daily.   Marland Kitchen omeprazole (PRILOSEC) 20 MG capsule Take 1 capsule (20 mg total) by mouth daily. In am before food 30 minutes  . ondansetron (ZOFRAN ODT) 4 MG disintegrating tablet Take 1 tablet (4 mg total) by mouth every 8 (eight) hours as needed for nausea or vomiting.  . promethazine (PHENERGAN) 25 MG tablet TAKE 1 TABLET(25 MG) BY MOUTH EVERY 8 HOURS AS NEEDED FOR NAUSEA OR VOMITING  . sertraline (ZOLOFT) 50 MG tablet Take 1 tablet (50 mg total) by mouth daily with breakfast.  . traZODone (DESYREL) 50 MG tablet Take 0.5-1 tablets (25-50 mg total) by mouth at bedtime as needed for sleep.  . Turmeric Curcumin 500 MG CAPS Take by mouth.  . valACYclovir (VALTREX) 1000 MG tablet    Allergies  Allergen Reactions  . Propofol Palpitations    BP drops and EKG changes, bladder retention  . Nsaids Other (See Comments)    Flare of Crohn's.  . Pentasa [Mesalamine] Other (See Comments)    Chest pain.   Recent Results (from the past 2160 hour(s))  Antinuclear Antib (ANA)     Status: None   Collection Time: 09/07/17 11:10 AM  Result Value Ref Range   Anit Nuclear Antibody(ANA) Negative Negative  Rheumatoid Factor     Status: None   Collection Time:  09/07/17 11:10 AM  Result Value Ref Range   Rhuematoid fact SerPl-aCnc <10.0 0.0 - 13.9 IU/mL  Sedimentation rate  Status: None   Collection Time: 09/07/17 11:10 AM  Result Value Ref Range   Sed Rate 23 0 - 40 mm/hr  C-reactive protein     Status: Abnormal   Collection Time: 09/07/17 11:10 AM  Result Value Ref Range   CRP 8.5 (H) 0.0 - 4.9 mg/L  CYCLIC CITRUL PEPTIDE ANTIBODY, IGG/IGA     Status: None   Collection Time: 09/07/17 11:10 AM  Result Value Ref Range   Cyclic Citrullin Peptide Ab 5 0 - 19 units    Comment:                           Negative               <20                           Weak positive      20 - 39                           Moderate positive  40 - 59                           Strong positive        >59   Lipase, blood     Status: None   Collection Time: 10/09/17 12:05 PM  Result Value Ref Range   Lipase 43 11 - 51 U/L    Comment: Performed at [facility], [address], [place] [address]  Comprehensive metabolic panel     Status: Abnormal   Collection Time: 10/09/17 12:05 PM  Result Value Ref Range   Sodium 139 135 - 145 mmol/L   Potassium 4.3 3.5 - 5.1 mmol/L   Chloride 103 101 - 111 mmol/L   CO2 27 22 - 32 mmol/L   Glucose, Bld 90 65 - 99 mg/dL   BUN 15 6 - 20 mg/dL   Creatinine, Ser 1.03 (H) 0.44 - 1.00 mg/dL   Calcium 9.3 8.9 - 10.3 mg/dL   Total Protein 8.2 (H) 6.5 - 8.1 g/dL   Albumin 4.2 3.5 - 5.0 g/dL   AST 98 (H) 15 - 41 U/L   ALT 106 (H) 14 - 54 U/L   Alkaline Phosphatase 111 38 - 126 U/L   Total Bilirubin 0.7 0.3 - 1.2 mg/dL   GFR calc non Af Amer >60 >60 mL/min   GFR calc Af Amer >60 >60 mL/min    Comment: (NOTE) The eGFR has been calculated using the CKD EPI equation. This calculation has not been validated in all clinical situations. eGFR's persistently <60 mL/min signify possible Chronic Kidney Disease.    Anion gap 9 5 - 15    Comment: Performed at [facility], [address]  [place], [place] [address]  CBC     Status: None   Collection Time: 10/09/17 12:05 PM  Result Value Ref Range   WBC 9.3 3.6 - 11.0 K/uL   RBC 4.33 3.80 - 5.20 MIL/uL   Hemoglobin 13.4 12.0 - 16.0 g/dL   HCT 40.2 35.0 - 47.0 %   MCV 92.8 80.0 - 100.0 fL   MCH 30.9 26.0 - 34.0 pg   MCHC 33.3 32.0 - 36.0 g/dL   RDW 13.4 11.5 - 14.5 %   Platelets 238 150 - 440 K/uL  Comment: Performed at [facility], [address] [place], [place] [address]  Urinalysis, Complete w Microscopic     Status: Abnormal   Collection Time: 10/09/17 12:10 PM  Result Value Ref Range   Color, Urine STRAW (A) YELLOW   APPearance CLEAR (A) CLEAR   Specific Gravity, Urine 1.004 (L) 1.005 - 1.030   pH 6.0 5.0 - 8.0   Glucose, UA NEGATIVE NEGATIVE mg/dL   Hgb urine dipstick NEGATIVE NEGATIVE   Bilirubin Urine NEGATIVE NEGATIVE   Ketones, ur NEGATIVE NEGATIVE mg/dL   Protein, ur NEGATIVE NEGATIVE mg/dL   Nitrite NEGATIVE NEGATIVE   Leukocytes, UA NEGATIVE NEGATIVE   RBC / HPF 0-5 0 - 5 RBC/hpf   WBC, UA 0-5 0 - 5 WBC/hpf   Bacteria, UA NONE SEEN NONE SEEN   Squamous Epithelial / LPF 0-5 (A) NONE SEEN    Comment: Performed at [facility], [address] [place], [place] [address]   Objective  There is no height or weight on file to calculate BMI. Wt Readings from Last 3 Encounters:  10/09/17 172 lb (78 kg)  09/07/17 171 lb 12.8 oz (77.9 kg)  07/27/17 170 lb 8 oz (77.3 kg)   Temp Readings from Last 3 Encounters:  10/09/17 98.6 F (37 C) (Oral)  09/07/17 98.3 F (36.8 C) (Oral)  07/27/17 97.6 F (36.4 C) (Oral)   BP Readings from Last 3 Encounters:  10/09/17 133/89  09/07/17 (!) 126/58  07/27/17 132/83   Pulse Readings from Last 3 Encounters:  10/09/17 72  09/07/17 81  07/27/17 70    Physical Exam  Constitutional: She is oriented to person, place, and time. Vital signs are normal. She appears well-developed and well-nourished. She is cooperative.  HENT:  Head: Normocephalic  and atraumatic.  Mouth/Throat: Oropharynx is clear and moist and mucous membranes are normal.  Right nose sore resolved    Eyes: Pupils are equal, round, and reactive to light. Conjunctivae are normal.  Cardiovascular: Normal rate, regular rhythm and normal heart sounds.  Pulmonary/Chest: Effort normal and breath sounds normal.  Abdominal: Soft. Bowel sounds are normal. She exhibits no distension. There is tenderness in the right upper quadrant.    Neurological: She is alert and oriented to person, place, and time. Gait normal.  Skin: Skin is warm, dry and intact.  Psychiatric: She has a normal mood and affect. Her speech is normal and behavior is normal. Judgment and thought content normal. Cognition and memory are normal.  Nursing note and vitals reviewed.   Assessment   1. RUQ pain and abnormal lfts with + 2-3 cm liver lesion noted [place] 10/09/17 with sludge w/o Gallstones  2. Chronic pain (Neck>lower back and hips) improved with PT this is 6th week  3. Menopausal hot flashes 4. HM Plan  1. Will sch MRI liver with and w/o  HIDA scan  2. Consider cervical MRI if PT does not help after 6 weeks back doing better after facet injections x 3  3. rec stop black cohosh  4.  Had flu shot  Tdap UTD hep B vaccine 2/3 today  shingrix 1/2 today   Sp partial hysterectomy. H/o Pap ASCUS 07/19/17 f/u with Dr. Kenton Kingfisher he wants to f/u in 1 year  Colonoscopy pt wants to hold as disc prev visits will keep encouraging given h/o Crohns per hx, FH polpys  Mammogram 06/26/17 neg  Dermatology saw recently Dr. Kellie Moor 07/2017 f/u in 1 year    Provider: Dr. Olivia Mackie [name]-[facility]

## 2017-11-27 LAB — COMPREHENSIVE METABOLIC PANEL
A/G RATIO: 1.8 (ref 1.2–2.2)
ALK PHOS: 84 IU/L (ref 39–117)
ALT: 27 IU/L (ref 0–32)
AST: 27 IU/L (ref 0–40)
Albumin: 4.8 g/dL (ref 3.5–5.5)
BUN/Creatinine Ratio: 16 (ref 9–23)
BUN: 17 mg/dL (ref 6–24)
Bilirubin Total: <0.2 mg/dL (ref 0.0–1.2)
CALCIUM: 10.1 mg/dL (ref 8.7–10.2)
CO2: 23 mmol/L (ref 20–29)
Chloride: 103 mmol/L (ref 96–106)
Creatinine, Ser: 1.08 mg/dL — ABNORMAL HIGH (ref 0.57–1.00)
GFR calc Af Amer: 68 mL/min/{1.73_m2} (ref >59)
GFR, EST NON AFRICAN AMERICAN: 59 mL/min/{1.73_m2} — AB (ref >59)
Globulin, Total: 2.7 g/dL (ref 1.5–4.5)
Glucose: 77 mg/dL (ref 65–99)
POTASSIUM: 4.5 mmol/L (ref 3.5–5.2)
Sodium: 145 mmol/L — ABNORMAL HIGH (ref 134–144)
Total Protein: 7.5 g/dL (ref 6.0–8.5)

## 2017-11-27 LAB — VITAMIN D 25 HYDROXY (VIT D DEFICIENCY, FRACTURES): VIT D 25 HYDROXY: 59.5 ng/mL (ref 30.0–100.0)

## 2017-11-29 ENCOUNTER — Ambulatory Visit: Payer: Managed Care, Other (non HMO) | Attending: Internal Medicine

## 2017-11-29 DIAGNOSIS — M25552 Pain in left hip: Secondary | ICD-10-CM

## 2017-11-29 DIAGNOSIS — G8929 Other chronic pain: Secondary | ICD-10-CM

## 2017-11-29 DIAGNOSIS — M25551 Pain in right hip: Secondary | ICD-10-CM | POA: Diagnosis present

## 2017-11-29 DIAGNOSIS — M545 Low back pain: Secondary | ICD-10-CM | POA: Insufficient documentation

## 2017-11-29 DIAGNOSIS — M542 Cervicalgia: Secondary | ICD-10-CM

## 2017-11-29 DIAGNOSIS — M6281 Muscle weakness (generalized): Secondary | ICD-10-CM | POA: Diagnosis not present

## 2017-11-29 NOTE — Patient Instructions (Signed)
Prone press-ups  Lying on your stomach,    Push yourself up with your hands, pushing with your right hand more.    Repeat 10 times   Perform 3 sets daily.

## 2017-11-29 NOTE — Therapy (Signed)
[facility] [address] [place], [address] Phone: [phone]   Fax:  [phone]  Physical Therapy Treatment  Patient Details  Name: Carolyn Esparza MRN: [id number] Date of Birth: 01/26/1966 Referring Provider: Orland Mustard, MD   Encounter Date: 11/29/2017  PT End of Session - 11/29/17 1733    Visit Number  14    Number of Visits  17    Date for PT Re-Evaluation  12/13/17    PT Start Time  0258    PT Stop Time  1831    PT Time Calculation (min)  58 min    Activity Tolerance  Patient tolerated treatment well    Behavior During Therapy  [facility] for tasks assessed/performed       Past Medical History:  Diagnosis Date  . Anxiety   . ASCUS with positive high risk HPV cervical   . CAD (coronary artery disease)   . Chicken pox   . Chronic pain    neck, back, b/l hips   . Depression   . GERD (gastroesophageal reflux disease)   . History of Crohn's disease   . Hyperlipidemia   . Left ovarian cyst    s/p removal of 1 ovary ? which one removed per pt   . Libido, decreased   . Thyroid nodule     Past Surgical History:  Procedure Laterality Date  . ABDOMINAL HYSTERECTOMY    . APPENDECTOMY  2004  . BLADDER SURGERY    . BREAST BIOPSY     x 2  . COLONOSCOPY    . OOPHORECTOMY     x1 ? which one removed per pt   . WISDOM TOOTH EXTRACTION      There were no vitals filed for this visit.  Subjective Assessment - 11/29/17 1734    Subjective  Has not had a good week. Took advil last night for her hips so she can go to sleep. The hips seem to bother her more when she is in her bed.  Pt lies down on her sides in bed. Does not sleep on her back unless she uses her CPAP.  4-5/10 bilateral hip pain currenty.  5/10 back pain currently. 6/10 neck pain currenty.  The manual therapy for her low back helped the day of the session. Back pain returned the next day.  Did her supine bilateral shoulder flexion  exercise (to promomote thoracic extension ) but did not see much difference for her neck.  Pt states that she has had chronic pain for about 15 years. Neck pain bothered her about 6-8 months ago      Pertinent History  Had facet shots in her low back last month (steriod) which pt states that it put a band aid on it. Had back problems for about 15 years. Currenlty has a difficult time standing up from sitting. Also has neck pain since 3-4 months ago, gradual onset. 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  Pt sits in front of her computer all day. Adjusted computer monitor to eye level. Feels like stress and use of the phone (Iphone 8, uses her R ear predominantly but now uses an ear pod on L ear after having neck pain which helps with the talking on the phone) might play a factor.  Tried neck stretches such as cervical flexion, UT stretch and does not know if they help.  Pt states having L hand numbness when  using her computer mouse. 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  When L hand gets numb she feels cramping. Has to shake her L hand to get the feeling back (possible ulnar nerve distribution but pt unsure).  Neck and both hips bother her more currently since she had shots for her back recently. R hip pain bothers her more. Was told that she has bursitis. Had shots in her R hip years ago which temporarily helped. Now has a hard time laying on her R hip when sleeping. R hip pain runs laterally down her thigh to just proximal to her knee (along the ITband/L5 distribution). Has been using lidocaine patches on her hip from time to time.  The shots she had in her back helped with her hip pain temporarily.   Neck and hips bothered her the most even before her low back shots.   R hip bothers her more than her neck.  Sits on a chair with a lumbar support.   Pt  states back pain does not go down her legs but it goes around her torso (underneath belly button) when it gets real bad.  R low back pain > L.     Patient Stated Goals  Not to hurt. To be able to not to be able to not worry about how long she is standing, not to worry about her hip hurting while she is sleeping, for her neck not to hurt when she looks around.     Currently in Pain?  Yes    Pain Score  6  neck    Pain Onset  More than a month ago                               PT Education - 11/29/17 1749    Education provided  Yes    Education Details  ther-ex    [employer]) Educated  Patient    Methods  Explanation;Demonstration;Tactile cues;Verbal cues    Comprehension  Returned demonstration;Verbalized understanding         Objectives Pt states that she has had chronic pain for about 15 years. Neck pain bothered her about 6-8 months ago   TTP bilateral greater trochanter and inferior to it.   Ther-ex   R S/L bows and arrows (to promote L trunk rotation to simulate R UPA to R lumbar transverse processes) 10x3 with 5 second holds   Then for other side 10x2 with 5 second holds    No change in back pain  Prone press-ups with emphasis on R side extension 10x3  Decreased back pain. Reviewed and given as part of her HEP. Pt demonstrated and verbalized understanding. Handout provided  Improved exercise technique, movement at target joints, use of target muscles after mod verbal, visual, tactile cues.    Manual therapy  Supine manual lumbar traction. Decreased back pain.  Prone R UPA to L5, L4, L3 transverse processes grade 3. Decreased low back pain.   Prone central P to A to mid thoracic spine to promote mobility. Grade 3.    Decreased back stiffness    Decreased back pain with treatment to decrease low back pressure as well as to promote low back mobility and extension. 4/10 back pain after session.    PT Long Term Goals - 11/19/17  1962  PT LONG TERM GOAL #1   Title  Patient will have a decrease in R hip pain to 4/10 or less at worst to promote ability to perform funcitonal tasks, tolerate R S/L position.     Baseline  8/10 R hip pain at worst for the past 2 months (10/15/2017); 6-7/10 R hip pain at most (not as much pain as it has been) for the past 7 days 11/19/2017.     Time  8    Period  Weeks    Status  On-going    Target Date  12/13/17      PT LONG TERM GOAL #2   Title  Patient will have a decrease in neck pain to 4/10 or less at worst to promote ability to look around as well as work at her desk with less pain.     Baseline  8/10 at worst (10/15/2017); 6/10 (11/19/2017)    Time  8    Period  Weeks    Status  On-going    Target Date  12/13/17      PT LONG TERM GOAL #3   Title  Patient will have a decrease in low back pain to 4/10 or less at worst to promote ability to tolerate standing for longer periods of time.     Baseline  8/10 back pain at worst (10/15/2017); 5/10 at most for the past 7 days (11/19/2017)    Time  8    Period  Weeks    Status  On-going    Target Date  12/13/17      PT LONG TERM GOAL #4   Title  Pt will improve her neck FOTO score by at least 8 points as a demonstration of improved function.     Baseline  65 (neck, 10/15/2017); 59 (neck, 11/19/2017)    Time  8    Period  Weeks    Status  On-going    Target Date  12/13/17      PT LONG TERM GOAL #5   Title  Pt will improve her NDI score by at least 12% as a demonstration of improved function.     Baseline  30% (10/15/2017); 38% (11/19/2017)    Time  8    Period  Weeks    Status  On-going    Target Date  12/13/17            Plan - 11/29/17 1732    Clinical Impression Statement  Decreased back pain with treatment to decrease low back pressure as well as to promote low back mobility and extension. 4/10 back pain after session    Rehab Potential  Fair    Clinical Impairments Affecting Rehab Potential  Chronicity of condition,  multiple areas of pain, difficulty tolerating positions such as prolonged standing or R S/L     PT Frequency  2x / week    PT Duration  8 weeks    PT Treatment/Interventions  Neuromuscular re-education;Therapeutic exercise;Therapeutic activities;Manual techniques;Aquatic Therapy;Electrical Stimulation;Iontophoresis 4mg /ml Dexamethasone;Traction;Ultrasound;Patient/family education;Dry needling    PT Next Visit Plan  glute med and max strengthening, scapular strengthening, gentle extension, trunk strengthening, anterior cervical strengthening, posture, manual techniques, modalities PRN    Consulted and Agree with Plan of Care  Patient       Patient will benefit from skilled therapeutic intervention in order to improve the following deficits and impairments:  Pain, Postural dysfunction, Improper body mechanics, Decreased strength  Visit Diagnosis: Muscle weakness (generalized)  Cervicalgia  Chronic bilateral low back pain,  with sciatica presence unspecified  Pain in right hip  Pain in left hip     Problem List Patient Active Problem List   Diagnosis Date Noted  . Menopause 11/26/2017  . Right upper quadrant pain 11/26/2017  . Abnormal Pap smear of cervix 07/27/2017  . Vasomotor flushing 07/19/2017  . Depression 07/18/2017  . Nausea 06/25/2017  . Hip pain, right 06/25/2017  . OSA (obstructive sleep apnea) 04/18/2017  . Annual physical exam 04/10/2017  . Fatigue 12/16/2016  . Chronic back pain 05/30/2016  . Abdominal pain 03/12/2016  . Anxiety and depression 12/28/2015  . History of obstructive sleep apnea 12/15/2015  . Thyroid nodule 12/15/2015  . GERD (gastroesophageal reflux disease) 12/15/2015  . Hyperlipidemia 12/15/2015  . Pain medication agreement signed 03/25/2015  . CAD in native artery 02/05/2015  . Right supraspinatus tenosynovitis 08/17/2014  . Crohn's disease involving terminal ileum ([place]) 03/30/2014  . FH: colon polyps 03/30/2014  . 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 Other bursitis disorders 07/12/2012  . Pain in joint, pelvic region and thigh 07/12/2012  . Crohn's disease ([place]) 01/23/2012    Joneen Boers PT, DPT   11/29/2017, 6:55 PM  [facility] [address], [place], [address] Phone: [phone]   Fax:  [phone]  Name: Carolyn Esparza MRN: [id number] Date of Birth: [birth year]

## 2017-12-03 ENCOUNTER — Ambulatory Visit: Payer: Managed Care, Other (non HMO)

## 2017-12-03 ENCOUNTER — Encounter (INDEPENDENT_AMBULATORY_CARE_PROVIDER_SITE_OTHER): Payer: Self-pay

## 2017-12-03 DIAGNOSIS — M6281 Muscle weakness (generalized): Secondary | ICD-10-CM

## 2017-12-03 DIAGNOSIS — M542 Cervicalgia: Secondary | ICD-10-CM

## 2017-12-03 NOTE — Therapy (Signed)
[facility] [address], [place], [address] Phone: [phone]   Fax:  [phone]  Physical Therapy Treatment  Patient Details  Name: Carolyn Esparza MRN: [id number] Date of Birth: April 15, 1966 Referring Provider: Orland Mustard, MD   Encounter Date: 12/03/2017  PT End of Session - 12/03/17 0804    Visit Number  15    Number of Visits  17    Date for PT Re-Evaluation  12/13/17    PT Start Time  0804    PT Stop Time  0909    PT Time Calculation (min)  65 min    Activity Tolerance  Patient tolerated treatment well    Behavior During Therapy  [facility] for tasks assessed/performed       Past Medical History:  Diagnosis Date  . Anxiety   . ASCUS with positive high risk HPV cervical   . CAD (coronary artery disease)   . Chicken pox   . Chronic pain    neck, back, b/l hips   . Depression   . GERD (gastroesophageal reflux disease)   . History of Crohn's disease   . Hyperlipidemia   . Left ovarian cyst    s/p removal of 1 ovary ? which one removed per pt   . Libido, decreased   . Thyroid nodule     Past Surgical History:  Procedure Laterality Date  . ABDOMINAL HYSTERECTOMY    . APPENDECTOMY  2004  . BLADDER SURGERY    . BREAST BIOPSY     x 2  . COLONOSCOPY    . OOPHORECTOMY     x1 ? which one removed per pt   . WISDOM TOOTH EXTRACTION      There were no vitals filed for this visit.  Subjective Assessment - 12/03/17 0805    Subjective  Neck is not good at all. Has been bothering her most of the weekend. Took Advil. 7/10 neck pain currently. Low back is better but bothered her this weekend as well. Took a hot bath this morning which helped, 4/10 currently,  3/10 bilateral hip pain currently.  Standing bothers her back. Back was a little better after PT session Thursday and Friday.     Pertinent History  Had facet shots in her low back last month (steriod) which pt states that it put a band aid  on it. Had back problems for about 15 years. Currenlty has a difficult time standing up from sitting. Also has neck pain since 3-4 months ago, gradual onset. 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  Pt sits in front of her computer all day. Adjusted computer monitor to eye level. Feels like stress and use of the phone (Iphone 8, uses her R ear predominantly but now uses an ear pod on L ear after having neck pain which helps with the talking on the phone) might play a factor.  Tried neck stretches such as cervical flexion, UT stretch and does not know if they help.  Pt states having L hand numbness when using her computer mouse. 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  When L hand gets numb she feels cramping. Has to shake her L hand to get the feeling back (possible ulnar nerve distribution but pt unsure).  Neck and both hips bother her more currently  since she had shots for her back recently. R hip pain bothers her more. Was told that she has bursitis. Had shots in her R hip years ago which temporarily helped. Now has a hard time laying on her R hip when sleeping. R hip pain runs laterally down her thigh to just proximal to her knee (along the ITband/L5 distribution). Has been using lidocaine patches on her hip from time to time.  The shots she had in her back helped with her hip pain temporarily.   Neck and hips bothered her the most even before her low back shots.   R hip bothers her more than her neck.  Sits on a chair with a lumbar support.   Pt states back pain does not go down her legs but it goes around her torso (underneath belly button) when it gets real bad.  R low back pain > L.     Patient Stated Goals  Not to hurt. To be able to not to be able to not worry about how long she is standing, not to worry about her hip hurting while she is sleeping, for  her neck not to hurt when she looks around.     Currently in Pain?  Yes    Pain Score  7  neck    Pain Onset  More than a month ago                               PT Education - 12/03/17 0827    Education provided  Yes    Education Details  ther-ex    [employer]) Educated  Patient    Methods  Explanation;Demonstration;Tactile cues;Verbal cues    Comprehension  Returned demonstration;Verbalized understanding         Objectives  Manual therapy  Supine STM to cervical paraspinal muscles. Slight decrease in cervical tightness  Seated STM to R rhomboid/upper trap muscle area  Decreased ache in the neutral position     Ther-ex  Supine chin tuck 10x5 seconds for 2 sets with towel roll behind neck  Seated cervical flexion isometrics, thumbs at chin providing isometric resistance, 10x5 seconds for 3 sets  Manually resisted cervical flexion isometrics 10x5 seconds, L hand resistance at forehead  Seated cervical flexion stretch 5x10 seconds   Seated manually resisted R scapular retraction targeting the lower trap muscles 10x5 seconds for 3 sets    Improved exercise technique, movement at target joints, use of target muscles after min to mod verbal, visual, tactile cues.      High Volt E-stim to lower cervical spine at upper trap area x 15 min at 85 V    Decreased neck ache with STM to decrease posterior cervical and upper trap/rhomnboid muscle tension. No change in neck ache after exercises or e-stim today.      PT Long Term Goals - 11/19/17 5277      PT LONG TERM GOAL #1   Title  Patient will have a decrease in R hip pain to 4/10 or less at worst to promote ability to perform funcitonal tasks, tolerate R S/L position.     Baseline  8/10 R hip pain at worst for the past 2 months (10/15/2017); 6-7/10 R hip pain at most (not as much pain as it has been) for the past 7 days 11/19/2017.     Time  8    Period  Weeks    Status  On-going  Target Date  12/13/17      PT LONG TERM GOAL #2   Title  Patient will have a decrease in neck pain to 4/10 or less at worst to promote ability to look around as well as work at her desk with less pain.     Baseline  8/10 at worst (10/15/2017); 6/10 (11/19/2017)    Time  8    Period  Weeks    Status  On-going    Target Date  12/13/17      PT LONG TERM GOAL #3   Title  Patient will have a decrease in low back pain to 4/10 or less at worst to promote ability to tolerate standing for longer periods of time.     Baseline  8/10 back pain at worst (10/15/2017); 5/10 at most for the past 7 days (11/19/2017)    Time  8    Period  Weeks    Status  On-going    Target Date  12/13/17      PT LONG TERM GOAL #4   Title  Pt will improve her neck FOTO score by at least 8 points as a demonstration of improved function.     Baseline  65 (neck, 10/15/2017); 59 (neck, 11/19/2017)    Time  8    Period  Weeks    Status  On-going    Target Date  12/13/17      PT LONG TERM GOAL #5   Title  Pt will improve her NDI score by at least 12% as a demonstration of improved function.     Baseline  30% (10/15/2017); 38% (11/19/2017)    Time  8    Period  Weeks    Status  On-going    Target Date  12/13/17            Plan - 12/03/17 0759    Clinical Impression Statement  Decreased neck ache with STM to decrease posterior cervical and upper trap/rhomnboid muscle tension. No change in neck ache after exercises or e-stim today.    Rehab Potential  Fair    Clinical Impairments Affecting Rehab Potential  Chronicity of condition, multiple areas of pain, difficulty tolerating positions such as prolonged standing or R S/L     PT Frequency  2x / week    PT Duration  8 weeks    PT Treatment/Interventions  Neuromuscular re-education;Therapeutic exercise;Therapeutic activities;Manual techniques;Aquatic Therapy;Electrical Stimulation;Iontophoresis 4mg /ml Dexamethasone;Traction;Ultrasound;Patient/family education;Dry needling     PT Next Visit Plan  glute med and max strengthening, scapular strengthening, gentle extension, trunk strengthening, anterior cervical strengthening, posture, manual techniques, modalities PRN    Consulted and Agree with Plan of Care  Patient       Patient will benefit from skilled therapeutic intervention in order to improve the following deficits and impairments:  Pain, Postural dysfunction, Improper body mechanics, Decreased strength  Visit Diagnosis: Muscle weakness (generalized)  Cervicalgia     Problem List Patient Active Problem List   Diagnosis Date Noted  . Menopause 11/26/2017  . Right upper quadrant pain 11/26/2017  . Abnormal Pap smear of cervix 07/27/2017  . Vasomotor flushing 07/19/2017  . Depression 07/18/2017  . Nausea 06/25/2017  . Hip pain, right 06/25/2017  . OSA (obstructive sleep apnea) 04/18/2017  . Annual physical exam 04/10/2017  . Fatigue 12/16/2016  . Chronic back pain 05/30/2016  . Abdominal pain 03/12/2016  . Anxiety and depression 12/28/2015  . History of obstructive sleep apnea 12/15/2015  . Thyroid nodule 12/15/2015  .  GERD (gastroesophageal reflux disease) 12/15/2015  . Hyperlipidemia 12/15/2015  . Pain medication agreement signed 03/25/2015  . CAD in native artery 02/05/2015  . Right supraspinatus tenosynovitis 08/17/2014  . Crohn's disease involving terminal ileum ([place]) 03/30/2014  . FH: colon polyps 03/30/2014  . 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 Other bursitis disorders 07/12/2012  . Pain in joint, pelvic region and thigh 07/12/2012  . Crohn's disease ([place]) 01/23/2012    Joneen Boers PT, DPT   12/03/2017, 9:32 AM  [facility] [address], [place], [address] Phone: [phone]   Fax:  [phone]  Name: Carolyn Esparza MRN: [id number] Date of Birth: April 20, 1966

## 2017-12-06 ENCOUNTER — Ambulatory Visit: Payer: Managed Care, Other (non HMO)

## 2017-12-06 DIAGNOSIS — M545 Low back pain: Secondary | ICD-10-CM

## 2017-12-06 DIAGNOSIS — M542 Cervicalgia: Secondary | ICD-10-CM

## 2017-12-06 DIAGNOSIS — M6281 Muscle weakness (generalized): Secondary | ICD-10-CM

## 2017-12-06 DIAGNOSIS — G8929 Other chronic pain: Secondary | ICD-10-CM

## 2017-12-06 NOTE — Patient Instructions (Signed)
  Use heating pad to posterior neck and bilateral upper trap muscles x 15 min   Follow by chin tucks 3x10 daily with 5 second holds   Pt demonstrated and verbalized understanding.

## 2017-12-06 NOTE — Therapy (Signed)
[facility] [address] [place], [address] Phone: [phone]   Fax:  [phone]  Physical Therapy Treatment  Patient Details  Name: Carolyn Esparza MRN: [id number] Date of Birth: 30-Jan-1966 Referring Provider: Orland Mustard, MD   Encounter Date: 12/06/2017  PT End of Session - 12/06/17 1743    Visit Number  16    Number of Visits  17    Date for PT Re-Evaluation  12/13/17    PT Start Time  6222    PT Stop Time  1832    PT Time Calculation (min)  49 min    Activity Tolerance  Patient tolerated treatment well    Behavior During Therapy  [facility] for tasks assessed/performed       Past Medical History:  Diagnosis Date  . Anxiety   . ASCUS with positive high risk HPV cervical   . CAD (coronary artery disease)   . Chicken pox   . Chronic pain    neck, back, b/l hips   . Depression   . GERD (gastroesophageal reflux disease)   . History of Crohn's disease   . Hyperlipidemia   . Left ovarian cyst    s/p removal of 1 ovary ? which one removed per pt   . Libido, decreased   . Thyroid nodule     Past Surgical History:  Procedure Laterality Date  . ABDOMINAL HYSTERECTOMY    . APPENDECTOMY  2004  . BLADDER SURGERY    . BREAST BIOPSY     x 2  . COLONOSCOPY    . OOPHORECTOMY     x1 ? which one removed per pt   . WISDOM TOOTH EXTRACTION      There were no vitals filed for this visit.  Subjective Assessment - 12/06/17 1745    Subjective  Neck is a 6/10 today. Low back is ok when not doing anything. 6/10 back pain when standing and walking. 4/10 bilateral hips today. Sleeping with different pillow which helped her neck.      Pertinent History  Had facet shots in her low back last month (steriod) which pt states that it put a band aid on it. Had back problems for about 15 years. Currenlty has a difficult time standing up from sitting. Also has neck pain since 3-4 months ago, gradual onset. MD did  an X-ray for her neck which revealed severe degeneration in her neck.  Pt sits in front of her computer all day. Adjusted computer monitor to eye level. Feels like stress and use of the phone (Iphone 8, uses her R ear predominantly but now uses an ear pod on L ear after having neck pain which helps with the talking on the phone) might play a factor.  Tried neck stretches such as cervical flexion, UT stretch and does not know if they help.  Pt states having L hand numbness when using her computer mouse. 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  When L hand gets numb she feels cramping. Has to shake her L hand to get the feeling back (possible ulnar nerve distribution but pt unsure).  Neck and both hips bother her more currently since she had shots for her back recently. R hip pain bothers her more. Was told that she has bursitis. Had shots in her R hip years ago which temporarily  helped. Now has a hard time laying on her R hip when sleeping. R hip pain runs laterally down her thigh to just proximal to her knee (along the ITband/L5 distribution). Has been using lidocaine patches on her hip from time to time.  The shots she had in her back helped with her hip pain temporarily.   Neck and hips bothered her the most even before her low back shots.   R hip bothers her more than her neck.  Sits on a chair with a lumbar support.   Pt states back pain does not go down her legs but it goes around her torso (underneath belly button) when it gets real bad.  R low back pain > L.     Patient Stated Goals  Not to hurt. To be able to not to be able to not worry about how long she is standing, not to worry about her hip hurting while she is sleeping, for her neck not to hurt when she looks around.     Currently in Pain?  Yes    Pain Score  6     Pain Onset  More than a month ago                                PT Education - 12/06/17 1805    Education provided  Yes    Education Details  ther-ex, HE[employer]    Person(s) Educated  Patient    Methods  Explanation;Demonstration;Tactile cues;Verbal cues    Comprehension  Returned demonstration;Verbalized understanding        Objectives  Manual therapy   Seated STM to posterior cervical paraspinal muscles Seated STM to R upper trap to decrease muscle tension   Decreased muscle tension, neck feel s more loose per pt.   Therapeutic exercises  Chin tucks 10x5 seconds for 3 sets. No pain after manual therapy  Seated manually resisted   Cervical flexion 10x5 seconds for 3 sets    Scapular retraction targeting the lower trap muscles    R 10x5 seconds for 3 sets    L 10x5 seconds for 3 sets  Seated press-ups 10x2 with 5 second holds  Standing low rows resisting green band 10x5 seconds for 2 sets  Standing L LE leg press resisting blue band 10x3 with bilateral UE assist  4/10 back pain after exercise. More of a tired feeling.   Try working on standing pelvic control to decrease pelvic drop with gait next session if appropriate  Try cervical rotation stretches next visit if appropriate.    Improved exercise technique, movement at target joints, use of target muscles after min to mod verbal, visual, tactile cues.     Decreased neck tightness after manual therapy. Worked on lower trap muscle strengthening to decrease upper trap muscle tension. No change in neck pain however after exercises.     PT Long Term Goals - 11/19/17 0109      PT LONG TERM GOAL #1   Title  Patient will have a decrease in R hip pain to 4/10 or less at worst to promote ability to perform funcitonal tasks, tolerate R S/L position.     Baseline  8/10 R hip pain at worst for the past 2 months (10/15/2017); 6-7/10 R hip pain at most (not as much pain as it has been) for the past 7 days 11/19/2017.     Time  8    Period  Weeks    Status  On-going    Target Date  12/13/17      PT LONG TERM GOAL #2   Title  Patient will have a decrease in neck pain to 4/10 or less at worst to promote ability to look around as well as work at her desk with less pain.     Baseline  8/10 at worst (10/15/2017); 6/10 (11/19/2017)    Time  8    Period  Weeks    Status  On-going    Target Date  12/13/17      PT LONG TERM GOAL #3   Title  Patient will have a decrease in low back pain to 4/10 or less at worst to promote ability to tolerate standing for longer periods of time.     Baseline  8/10 back pain at worst (10/15/2017); 5/10 at most for the past 7 days (11/19/2017)    Time  8    Period  Weeks    Status  On-going    Target Date  12/13/17      PT LONG TERM GOAL #4   Title  Pt will improve her neck FOTO score by at least 8 points as a demonstration of improved function.     Baseline  65 (neck, 10/15/2017); 59 (neck, 11/19/2017)    Time  8    Period  Weeks    Status  On-going    Target Date  12/13/17      PT LONG TERM GOAL #5   Title  Pt will improve her NDI score by at least 12% as a demonstration of improved function.     Baseline  30% (10/15/2017); 38% (11/19/2017)    Time  8    Period  Weeks    Status  On-going    Target Date  12/13/17            Plan - 12/06/17 1805    Clinical Impression Statement  Decreased neck tightness after manual therapy. Worked on lower trap muscle strengthening to decrease upper trap muscle tension. No change in neck pain however after exercises.     Rehab Potential  Fair    Clinical Impairments Affecting Rehab Potential  Chronicity of condition, multiple areas of pain, difficulty tolerating positions such as prolonged standing or R S/L     PT Frequency  2x / week    PT Duration  8 weeks    PT Treatment/Interventions  Neuromuscular re-education;Therapeutic exercise;Therapeutic activities;Manual techniques;Aquatic Therapy;Electrical Stimulation;Iontophoresis 4mg /ml  Dexamethasone;Traction;Ultrasound;Patient/family education;Dry needling    PT Next Visit Plan  glute med and max strengthening, scapular strengthening, gentle extension, trunk strengthening, anterior cervical strengthening, posture, manual techniques, modalities PRN    Consulted and Agree with Plan of Care  Patient       Patient will benefit from skilled therapeutic intervention in order to improve the following deficits and impairments:  Pain, Postural dysfunction, Improper body mechanics, Decreased strength  Visit Diagnosis: Muscle weakness (generalized)  Cervicalgia  Chronic bilateral low back pain, with sciatica presence unspecified     Problem List Patient Active Problem List   Diagnosis Date Noted  . Menopause 11/26/2017  . Right upper quadrant pain 11/26/2017  . Abnormal Pap smear of cervix 07/27/2017  . Vasomotor flushing 07/19/2017  . Depression 07/18/2017  . Nausea 06/25/2017  . Hip pain, right 06/25/2017  . OSA (obstructive sleep apnea) 04/18/2017  . Annual physical exam 04/10/2017  . Fatigue 12/16/2016  . Chronic back pain 05/30/2016  . Abdominal  pain 03/12/2016  . Anxiety and depression 12/28/2015  . History of obstructive sleep apnea 12/15/2015  . Thyroid nodule 12/15/2015  . GERD (gastroesophageal reflux disease) 12/15/2015  . Hyperlipidemia 12/15/2015  . Pain medication agreement signed 03/25/2015  . CAD in native artery 02/05/2015  . Right supraspinatus tenosynovitis 08/17/2014  . Crohn's disease involving terminal ileum ([place]) 03/30/2014  . FH: colon polyps 03/30/2014  . 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 Other bursitis disorders 07/12/2012  . Pain in joint, pelvic region and thigh 07/12/2012  . Crohn's disease ([place]) 01/23/2012   Joneen Boers PT, DPT   12/06/2017, 7:01 PM  [facility] [address], [place], [address] Phone: [phone]   Fax:  [phone]  Name: Carolyn Esparza MRN: [id number] Date of Birth: 02-Mar-1966

## 2017-12-10 ENCOUNTER — Ambulatory Visit: Payer: Managed Care, Other (non HMO)

## 2017-12-10 DIAGNOSIS — M6281 Muscle weakness (generalized): Secondary | ICD-10-CM | POA: Diagnosis not present

## 2017-12-10 DIAGNOSIS — M542 Cervicalgia: Secondary | ICD-10-CM

## 2017-12-10 NOTE — Therapy (Signed)
[facility] [address], [place], [address] Phone: [phone]   Fax:  [phone]  Physical Therapy Treatment  Patient Details  Name: Carolyn Esparza MRN: [id number] Date of Birth: 18-Jul-1966 Referring Provider: Orland Mustard, MD   Encounter Date: 12/10/2017  PT End of Session - 12/10/17 1734    Visit Number  17    Number of Visits  17    Date for PT Re-Evaluation  12/13/17    PT Start Time  1540    PT Stop Time  1837    PT Time Calculation (min)  62 min    Activity Tolerance  Patient tolerated treatment well    Behavior During Therapy  [facility] for tasks assessed/performed       Past Medical History:  Diagnosis Date  . Anxiety   . ASCUS with positive high risk HPV cervical   . CAD (coronary artery disease)   . Chicken pox   . Chronic pain    neck, back, b/l hips   . Depression   . GERD (gastroesophageal reflux disease)   . History of Crohn's disease   . Hyperlipidemia   . Left ovarian cyst    s/p removal of 1 ovary ? which one removed per pt   . Libido, decreased   . Thyroid nodule     Past Surgical History:  Procedure Laterality Date  . ABDOMINAL HYSTERECTOMY    . APPENDECTOMY  2004  . BLADDER SURGERY    . BREAST BIOPSY     x 2  . COLONOSCOPY    . OOPHORECTOMY     x1 ? which one removed per pt   . WISDOM TOOTH EXTRACTION      There were no vitals filed for this visit.  Subjective Assessment - 12/10/17 1737    Subjective  Back is bothering her more today (lower R back) than usual. Seems to be snarling today. 7/10 R low back pain currently. 5/10 neck pain currently. 4/10 bilateral hip pain currently unless she startes pressing on them. Tries to take Advil prior to going to bed to help her sleep.  Neck felt more loose after last session for the rest of the day.     Pertinent History  Had facet shots in her low back last month (steriod) which pt states that it put a band aid on it.  Had back problems for about 15 years. Currenlty has a difficult time standing up from sitting. Also has neck pain since 3-4 months ago, gradual onset. 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  Pt sits in front of her computer all day. Adjusted computer monitor to eye level. Feels like stress and use of the phone (Iphone 8, uses her R ear predominantly but now uses an ear pod on L ear after having neck pain which helps with the talking on the phone) might play a factor.  Tried neck stretches such as cervical flexion, UT stretch and does not know if they help.  Pt states having L hand numbness when using her computer mouse. 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  When L hand gets numb she feels cramping. Has to shake her L hand to get the feeling back (possible ulnar nerve distribution but pt unsure).  Neck and both hips bother her more  currently since she had shots for her back recently. R hip pain bothers her more. Was told that she has bursitis. Had shots in her R hip years ago which temporarily helped. Now has a hard time laying on her R hip when sleeping. R hip pain runs laterally down her thigh to just proximal to her knee (along the ITband/L5 distribution). Has been using lidocaine patches on her hip from time to time.  The shots she had in her back helped with her hip pain temporarily.   Neck and hips bothered her the most even before her low back shots.   R hip bothers her more than her neck.  Sits on a chair with a lumbar support.   Pt states back pain does not go down her legs but it goes around her torso (underneath belly button) when it gets real bad.  R low back pain > L.     Patient Stated Goals  Not to hurt. To be able to not to be able to not worry about how long she is standing, not to worry about her hip hurting while she is sleeping, for her neck  not to hurt when she looks around.     Currently in Pain?  Yes    Pain Score  7     Pain Location  Back    Pain Onset  More than a month ago                               PT Education - 12/10/17 1802    Education provided  Yes    Education Details  ther-ex    [employer]) Educated  Patient    Methods  Explanation;Demonstration;Tactile cues;Verbal cues    Comprehension  Returned demonstration;Verbalized understanding         Objectives  L lateral shift posture R pelvis higher in standing.   Seated trunk flexion feels better  Supine hip IR at 90/90 position  R: 26 degrees  L: 28 degrees   Therapeutic exercises   Standing L lateral shift correction 6x5 seconds. Increased back pain  standing R shoulder adduction resisting yellow band 10x5 seconds for 3 sets   Long sit test suggests anterior nutation of L innominate  Supine bridge 2x. Low back discomfort  Supine manually resisted L hip extension in SKTC position 10x4 with 5 seconds   Supine R LE SLR R hip flexion 10x, then 10x2 with 2 lbs  Supine manually resisted R hip extension in SKTC position 10x3 with 5 second holds  Seated R hip extension isometrics 10x5 seconds   Seated manually resisted pallof press resisting L trunk rotation 10x5 seconds    Then resisting L trunk rotation and R shoulder extension 10x5 seconds. (decreased anterior tilt of R pelvis observed). No change in pain  Seated manually resisted trunk flexion isometrics 10x5 seconds for 3 sets  Supine L side bend stretch to decrease pressure to R low back. 5/10 back pain in that position   No change in back pain in sitting after aforementioned exercises.    Cervical rotation  R: 52 degrees  L: 60 degrees   Standing R shoulder extension with scapular retraction resisting double yellow band 10x3 with 5 second holds  Standing R shoulder horizontal abduction resisting yellow band 10x3   Increased time spent today to  address both the neck and the back   Try STM to R quadratus lumborum next visit if  appropriate.     Improved exercise technique, movement at target joints, use of target muscles after min to  mod verbal, visual, tactile cues.   Worked on R scapular strengthening today secondary to decreased neck pain with treatment to decrease L upper trap and rhomboid muscle tension previous sessions. No change in neck pain. Worked on R and L glute max and trunk strengthening. No change and back pain. Increased pain with L lateral shift correction. Decreases with rest.         PT Long Term Goals - 11/19/17 4650      PT LONG TERM GOAL #1   Title  Patient will have a decrease in R hip pain to 4/10 or less at worst to promote ability to perform funcitonal tasks, tolerate R S/L position.     Baseline  8/10 R hip pain at worst for the past 2 months (10/15/2017); 6-7/10 R hip pain at most (not as much pain as it has been) for the past 7 days 11/19/2017.     Time  8    Period  Weeks    Status  On-going    Target Date  12/13/17      PT LONG TERM GOAL #2   Title  Patient will have a decrease in neck pain to 4/10 or less at worst to promote ability to look around as well as work at her desk with less pain.     Baseline  8/10 at worst (10/15/2017); 6/10 (11/19/2017)    Time  8    Period  Weeks    Status  On-going    Target Date  12/13/17      PT LONG TERM GOAL #3   Title  Patient will have a decrease in low back pain to 4/10 or less at worst to promote ability to tolerate standing for longer periods of time.     Baseline  8/10 back pain at worst (10/15/2017); 5/10 at most for the past 7 days (11/19/2017)    Time  8    Period  Weeks    Status  On-going    Target Date  12/13/17      PT LONG TERM GOAL #4   Title  Pt will improve her neck FOTO score by at least 8 points as a demonstration of improved function.     Baseline  65 (neck, 10/15/2017); 59 (neck, 11/19/2017)    Time  8    Period  Weeks    Status   On-going    Target Date  12/13/17      PT LONG TERM GOAL #5   Title  Pt will improve her NDI score by at least 12% as a demonstration of improved function.     Baseline  30% (10/15/2017); 38% (11/19/2017)    Time  8    Period  Weeks    Status  On-going    Target Date  12/13/17            Plan - 12/10/17 1733    Clinical Impression Statement  Worked on R scapular strengthening today secondary to decreased neck pain with treatment to decrease L upper trap and rhomboid muscle tension previous sessions. No change in neck pain. Worked on R and L glute max and trunk strengthening. No change and back pain. Increased pain with L lateral shift correction. Decreases with rest.     Rehab Potential  Fair    Clinical Impairments Affecting Rehab Potential  Chronicity of condition, multiple areas  of pain, difficulty tolerating positions such as prolonged standing or R S/L     PT Frequency  2x / week    PT Duration  8 weeks    PT Treatment/Interventions  Neuromuscular re-education;Therapeutic exercise;Therapeutic activities;Manual techniques;Aquatic Therapy;Electrical Stimulation;Iontophoresis 4mg /ml Dexamethasone;Traction;Ultrasound;Patient/family education;Dry needling    PT Next Visit Plan  glute med and max strengthening, scapular strengthening, gentle extension, trunk strengthening, anterior cervical strengthening, posture, manual techniques, modalities PRN    Consulted and Agree with Plan of Care  Patient       Patient will benefit from skilled therapeutic intervention in order to improve the following deficits and impairments:  Pain, Postural dysfunction, Improper body mechanics, Decreased strength  Visit Diagnosis: Muscle weakness (generalized)  Cervicalgia     Problem List Patient Active Problem List   Diagnosis Date Noted  . Menopause 11/26/2017  . Right upper quadrant pain 11/26/2017  . Abnormal Pap smear of cervix 07/27/2017  . Vasomotor flushing 07/19/2017  . Depression  07/18/2017  . Nausea 06/25/2017  . Hip pain, right 06/25/2017  . OSA (obstructive sleep apnea) 04/18/2017  . Annual physical exam 04/10/2017  . Fatigue 12/16/2016  . Chronic back pain 05/30/2016  . Abdominal pain 03/12/2016  . Anxiety and depression 12/28/2015  . History of obstructive sleep apnea 12/15/2015  . Thyroid nodule 12/15/2015  . GERD (gastroesophageal reflux disease) 12/15/2015  . Hyperlipidemia 12/15/2015  . Pain medication agreement signed 03/25/2015  . CAD in native artery 02/05/2015  . Right supraspinatus tenosynovitis 08/17/2014  . Crohn's disease involving terminal ileum ([place]) 03/30/2014  . FH: colon polyps 03/30/2014  . 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 Other bursitis disorders 07/12/2012  . Pain in joint, pelvic region and thigh 07/12/2012  . Crohn's disease ([place]) 01/23/2012    Joneen Boers PT, DPT   12/10/2017, 6:49 PM  [facility] [address], [place], [address] Phone: [phone]   Fax:  [phone]  Name: Carolyn Esparza MRN: [id number] Date of Birth: [birth year]

## 2017-12-10 NOTE — Patient Instructions (Signed)
Use heating pad to posterior neck and bilateral upper trap muscles x 15 min              Follow by chin tucks 3x10 daily with 5 second holds              Pt demonstrated and verbalized understanding.

## 2017-12-13 ENCOUNTER — Ambulatory Visit: Payer: Managed Care, Other (non HMO)

## 2017-12-13 DIAGNOSIS — G8929 Other chronic pain: Secondary | ICD-10-CM

## 2017-12-13 DIAGNOSIS — M545 Low back pain: Secondary | ICD-10-CM

## 2017-12-13 DIAGNOSIS — M6281 Muscle weakness (generalized): Secondary | ICD-10-CM | POA: Diagnosis not present

## 2017-12-13 DIAGNOSIS — M25551 Pain in right hip: Secondary | ICD-10-CM

## 2017-12-13 DIAGNOSIS — M542 Cervicalgia: Secondary | ICD-10-CM

## 2017-12-13 DIAGNOSIS — M25552 Pain in left hip: Secondary | ICD-10-CM

## 2017-12-13 NOTE — Patient Instructions (Addendum)
   Quadriceps Set (Prone)   With toes supporting lower legs, squeeze rear end muscles and tighten thigh muscles to straighten your knee. Hold _10___ seconds. Relax.  Alternated sides.  Repeat __10__ times per set. Do ___3_ sets per session. Do ___1_ sessions per day.  http://orth.exer.us/726   Copyright  VHI. All rights reserved.      Seated physioball flexion to the L to stretch R posterior lateral back 30 seconds x 3  Decreased low back tightness, 5/10 back pain afterwards   Reviewed and given as part of her HEP. Pt demonstrated and verbalized understanding     Levator scapula stretch  R 30 seconds x 3  L 30 seconds x 3   Reviewed and given as part of her HEP. Pt demonstrated and verbalized understanding.

## 2017-12-13 NOTE — Therapy (Signed)
[facility] [address] [place], [place], [address] Phone: [phone]   Fax:  [phone]  Physical Therapy Treatment And Discharge Summary   Patient Details  Name: Carolyn Esparza MRN: [id number] Date of Birth: 04/02/1966 Referring Provider: Tracy [name], MD   Encounter Date: 12/13/2017  PT End of Session - 12/13/17 1735    Visit Number  18    Number of Visits  17    Date for PT Re-Evaluation  12/13/17    PT Start Time  1735    PT Stop Time  1840    PT Time Calculation (min)  65 min    Activity Tolerance  Patient tolerated treatment well    Behavior During Therapy  WFL for tasks assessed/performed       Past Medical History:  Diagnosis Date  . Anxiety   . ASCUS with positive high risk HPV cervical   . CAD (coronary artery disease)   . Chicken pox   . Chronic pain    neck, back, b/l hips   . Depression   . GERD (gastroesophageal reflux disease)   . History of Crohn's disease   . Hyperlipidemia   . Left ovarian cyst    s/p removal of 1 ovary ? which one removed per pt   . Libido, decreased   . Thyroid nodule     Past Surgical History:  Procedure Laterality Date  . ABDOMINAL HYSTERECTOMY    . APPENDECTOMY  2004  . BLADDER SURGERY    . BREAST BIOPSY     x 2  . COLONOSCOPY    . OOPHORECTOMY     x1 ? which one removed per pt   . WISDOM TOOTH EXTRACTION      There were no vitals filed for this visit.  Subjective Assessment - 12/13/17 1736    Subjective  7/10 neck pain current and worst for the past 7 days, 6/10 back pain currently (7/10 at most for the past 7 days), 5/10 bilateral hips currently (6/10 at most for the past 7 days).  Did not notice a whole lot of difference after last session.  Feels like PT helps some days, some days it does not.   Wants to try the E-sim again for her neck.  Pt states she wants to hold off on PT today and go back to her MD to possibly get an MRI for her  neck.      Pertinent History  Had facet shots in her low back last month (steriod) which pt states that it put a band aid on it. Had back problems for about 15 years. Currenlty has a difficult time standing up from sitting. Also has neck pain since 3-4 months ago, gradual onset. 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  Pt sits in front of her computer all day. Adjusted computer monitor to eye level. Feels like stress and use of the phone (Iphone 8, uses her R ear predominantly but now uses an ear pod on L ear after having neck pain which helps with the talking on the phone) might play a factor.  Tried neck stretches such as cervical flexion, UT stretch and does not know if they help.  Pt states having L hand numbness when using her computer mouse. Had PT for bilateral arms for fused proximal forearm bones several years ago which taught her to quit going overboard and not to overuse her hands as   much.  When L hand gets numb she feels cramping. Has to shake her L hand to get the feeling back (possible ulnar nerve distribution but pt unsure).  Neck and both hips bother her more currently since she had shots for her back recently. R hip pain bothers her more. Was told that she has bursitis. Had shots in her R hip years ago which temporarily helped. Now has a hard time laying on her R hip when sleeping. R hip pain runs laterally down her thigh to just proximal to her knee (along the ITband/L5 distribution). Has been using lidocaine patches on her hip from time to time.  The shots she had in her back helped with her hip pain temporarily.   Neck and hips bothered her the most even before her low back shots.   R hip bothers her more than her neck.  Sits on a chair with a lumbar support.   Pt states back pain does not go down her legs but it goes around her torso (underneath belly button) when it gets real bad.  R low back pain > L.     Patient Stated Goals  Not to hurt. To be able to  not to be able to not worry about how long she is standing, not to worry about her hip hurting while she is sleeping, for her neck not to hurt when she looks around.     Currently in Pain?  Yes    Pain Score  7     Pain Onset  More than a month ago                               PT Education - 12/13/17 1824    Education provided  Yes    Education Details  ther-ex    [employer]) Educated  Patient    Methods  Explanation;Demonstration;Tactile cues;Verbal cues    Comprehension  Returned demonstration;Verbalized understanding           Objectives    Therapeutic exercises  Seated cervical flexion isometrics 10x5 seconds for 3 sets SCM stretch   R 30 seconds x 3   L 30 seconds x 3   Levator scapula stretch  R 30 seconds x 3  L 30 seconds x 3   Reviewed and given as part of her HEP. Pt demonstrated and verbalized understanding.    No change in neck pain  Prone glute max/quad set 10x5 seconds each LE  Then 10x10 seconds each LE  Standing L trunk side bend 15 seconds x 3  Low back discomfort  Seated physioball flexion to the L to stretch R posterior lateral back 30 seconds x 3  Decreased low back tightness, 5/10 back pain afterwards   Reviewed and given as part of her HEP. Pt demonstrated and verbalized understanding     Improved exercise technique, movement at target joints, use of target muscles after mod verbal, visual, tactile cues.      Manual therapy   Seated L to R transverse pressure to C6 and C7 spinous process with L cervical rotation 10x3 each   No change in neck pain  Prone STM R quadratus lumborum  Back does not feel quite as tight. 5/10 back pain afterwards   Worked on cervical and lumbar stretches to help decrease tension to neck and low back. Slight decrease in low back pain following treatment to decrease R quadratus lumborum muscle tension. No change  in neck pain. Pt initially made some progress with decreased neck  and low back pain with physical therapy but pain levels eventually increased again. Hip pain overall improved from 8/10 to 6/10 at worst. Skilled physical therapy services discharged secondary to minimal to no overall progress with decreasing neck and low back pain. Challenges to progress include chronicity of condition. Pt to continue with her HEP to help keep pain form worsening and hopefully see improvement in the long run.     PT Long Term Goals - 12/13/17 1849      PT LONG TERM GOAL #1   Title  Patient will have a decrease in R hip pain to 4/10 or less at worst to promote ability to perform funcitonal tasks, tolerate R S/L position.     Baseline  8/10 R hip pain at worst for the past 2 months (10/15/2017); 6-7/10 R hip pain at most (not as much pain as it has been) for the past 7 days 11/19/2017.   6/10 hip pain at most for the past 7 days (12/13/2017)    Time  8    Period  Weeks    Status  On-going    Target Date  12/13/17      PT LONG TERM GOAL #2   Title  Patient will have a decrease in neck pain to 4/10 or less at worst to promote ability to look around as well as work at her desk with less pain.     Baseline  8/10 at worst (10/15/2017); 6/10 (11/19/2017); 7/10 at worst for the past 7 days (12/13/2017)    Time  8    Period  Weeks    Status  On-going    Target Date  12/13/17      PT LONG TERM GOAL #3   Title  Patient will have a decrease in low back pain to 4/10 or less at worst to promote ability to tolerate standing for longer periods of time.     Baseline  8/10 back pain at worst (10/15/2017); 5/10 at most for the past 7 days (11/19/2017); 7/10 low back pain at most for the past 7 days (12/13/2017)    Time  8    Period  Weeks    Status  On-going    Target Date  12/13/17      PT LONG TERM GOAL #4   Title  Pt will improve her neck FOTO score by at least 8 points as a demonstration of improved function.     Baseline  65 (neck, 10/15/2017); 59 (neck, 11/19/2017); 45 (12/13/2017)    Time   8    Period  Weeks    Status  Not Met    Target Date  12/13/17      PT LONG TERM GOAL #5   Title  Pt will improve her NDI score by at least 12% as a demonstration of improved function.     Baseline  30% (10/15/2017); 38% (11/19/2017); 44% (12/13/2017)    Time  8    Period  Weeks    Status  Not Met    Target Date  12/13/17            Plan - 12/13/17 1825    Clinical Impression Statement  Worked on cervical and lumbar stretches to help decrease tension to neck and low back. Slight decrease in low back pain following treatment to decrease R quadratus lumborum muscle tension. No change in neck pain. Pt initially made some progress with   decreased neck and low back pain with physical therapy but pain levels eventually increased again. Hip pain overall improved from 8/10 to 6/10 at worst. Skilled physical therapy services discharged secondary to minimal to no overall progress with decreasing neck and low back pain. Challenges to progress include chronicity of condition. Pt to continue with her HEP to help keep pain form worsening and hopefully see improvement in the long run.     History and Personal Factors relevant to plan of care:  Chronicity of condition, multiple areas of pain, difficulty tolerating positions such as prolonged standing, or R S/L    Clinical Presentation  Evolving    Clinical Presentation due to:  neck and back pain increasing again; pain levels at worst however still less than eval    Clinical Decision Making  Moderate    Rehab Potential  Fair    Clinical Impairments Affecting Rehab Potential  Chronicity of condition, multiple areas of pain, difficulty tolerating positions such as prolonged standing or R S/L     PT Frequency  --    PT Duration  --    PT Treatment/Interventions  Neuromuscular re-education;Therapeutic exercise;Therapeutic activities;Manual techniques;Electrical Stimulation;Patient/family education    PT Next Visit Plan  Continue with HEP    Consulted and  Agree with Plan of Care  Patient       Patient will benefit from skilled therapeutic intervention in order to improve the following deficits and impairments:  Pain, Postural dysfunction, Improper body mechanics, Decreased strength  Visit Diagnosis: Muscle weakness (generalized)  Cervicalgia  Chronic bilateral low back pain, with sciatica presence unspecified  Pain in right hip  Pain in left hip     Problem List Patient Active Problem List   Diagnosis Date Noted  . Menopause 11/26/2017  . Right upper quadrant pain 11/26/2017  . Abnormal Pap smear of cervix 07/27/2017  . Vasomotor flushing 07/19/2017  . Depression 07/18/2017  . Nausea 06/25/2017  . Hip pain, right 06/25/2017  . OSA (obstructive sleep apnea) 04/18/2017  . Annual physical exam 04/10/2017  . Fatigue 12/16/2016  . Chronic back pain 05/30/2016  . Abdominal pain 03/12/2016  . Anxiety and depression 12/28/2015  . History of obstructive sleep apnea 12/15/2015  . Thyroid nodule 12/15/2015  . GERD (gastroesophageal reflux disease) 12/15/2015  . Hyperlipidemia 12/15/2015  . Pain medication agreement signed 03/25/2015  . CAD in native artery 02/05/2015  . Right supraspinatus tenosynovitis 08/17/2014  . Crohn's disease involving terminal ileum ([facility]) 03/30/2014  . FH: colon polyps 03/30/2014  . 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 Other bursitis disorders 07/12/2012  . Pain in joint, pelvic region and thigh 07/12/2012  . Crohn's disease ([facility]) 01/23/2012    Thank you for your referral.     PT, DPT   12/13/2017, 7:08 PM  [facility] [address] , [place], [address] Phone: [phone]   Fax:  [phone]  Name: Georgene L Duhamel MRN: [id number] Date of Birth: 07/08/1966   

## 2017-12-14 ENCOUNTER — Other Ambulatory Visit: Payer: Self-pay | Admitting: Family Medicine

## 2017-12-14 ENCOUNTER — Other Ambulatory Visit: Payer: Self-pay | Admitting: Internal Medicine

## 2017-12-14 ENCOUNTER — Telehealth: Payer: Self-pay

## 2017-12-14 NOTE — Telephone Encounter (Signed)
Copied from [facility] [id number]. Topic: General - Other >> Dec 14, 2017 10:40 AM Yvette Rack wrote: Reason for CRM: Collie Siad with [employer] requested CPT code regarding gallbladder. Collie Siad states pt is concerned and wants to know if an approval is needed. Pt would like a call back.

## 2017-12-14 NOTE — Telephone Encounter (Signed)
Last OV 11/26/17 last filled 05/28/17 30 0rf

## 2017-12-17 ENCOUNTER — Ambulatory Visit
Admission: RE | Admit: 2017-12-17 | Discharge: 2017-12-17 | Disposition: A | Discharge status: Home / Self Care | Payer: Managed Care, Other (non HMO) | Source: Ambulatory Visit | Attending: Internal Medicine | Admitting: Internal Medicine

## 2017-12-17 ENCOUNTER — Encounter
Admission: RE | Admit: 2017-12-17 | Discharge: 2017-12-17 | Disposition: A | Discharge status: Home / Self Care | Payer: Managed Care, Other (non HMO) | Source: Ambulatory Visit | Attending: Internal Medicine | Admitting: Internal Medicine

## 2017-12-17 DIAGNOSIS — K5730 Diverticulosis of large intestine without perforation or abscess without bleeding: Secondary | ICD-10-CM | POA: Diagnosis not present

## 2017-12-17 DIAGNOSIS — R1011 Right upper quadrant pain: Secondary | ICD-10-CM

## 2017-12-17 DIAGNOSIS — M5136 Other intervertebral disc degeneration, lumbar region: Secondary | ICD-10-CM | POA: Diagnosis not present

## 2017-12-17 DIAGNOSIS — K769 Liver disease, unspecified: Secondary | ICD-10-CM

## 2017-12-17 DIAGNOSIS — D1803 Hemangioma of intra-abdominal structures: Secondary | ICD-10-CM | POA: Diagnosis not present

## 2017-12-17 MED ORDER — TECHNETIUM TC 99M MEBROFENIN IV KIT
5.0000 | PACK | Freq: Once | INTRAVENOUS | Status: AC | PRN
  Administered 2017-12-17: 5.476 via INTRAVENOUS

## 2017-12-17 MED ORDER — GADOBENATE DIMEGLUMINE 529 MG/ML IV SOLN
15.0000 mL | Freq: Once | INTRAVENOUS | Status: AC | PRN
  Administered 2017-12-17: 15 mL via INTRAVENOUS

## 2017-12-27 ENCOUNTER — Encounter: Payer: Self-pay | Admitting: Internal Medicine

## 2017-12-27 ENCOUNTER — Other Ambulatory Visit: Payer: Self-pay | Admitting: Internal Medicine

## 2017-12-27 DIAGNOSIS — G8929 Other chronic pain: Secondary | ICD-10-CM

## 2017-12-27 DIAGNOSIS — M5030 Other cervical disc degeneration, unspecified cervical region: Secondary | ICD-10-CM

## 2017-12-27 DIAGNOSIS — M542 Cervicalgia: Secondary | ICD-10-CM

## 2017-12-29 ENCOUNTER — Other Ambulatory Visit: Payer: Self-pay | Admitting: Family Medicine

## 2018-01-08 ENCOUNTER — Encounter: Payer: Self-pay | Admitting: Internal Medicine

## 2018-01-08 ENCOUNTER — Ambulatory Visit
Admission: RE | Admit: 2018-01-08 | Discharge: 2018-01-08 | Disposition: A | Discharge status: Home / Self Care | Payer: Managed Care, Other (non HMO) | Source: Ambulatory Visit | Attending: Internal Medicine | Admitting: Internal Medicine

## 2018-01-08 DIAGNOSIS — M5030 Other cervical disc degeneration, unspecified cervical region: Secondary | ICD-10-CM

## 2018-01-08 DIAGNOSIS — G8929 Other chronic pain: Secondary | ICD-10-CM | POA: Insufficient documentation

## 2018-01-08 DIAGNOSIS — M542 Cervicalgia: Secondary | ICD-10-CM

## 2018-01-08 DIAGNOSIS — M4802 Spinal stenosis, cervical region: Secondary | ICD-10-CM | POA: Diagnosis not present

## 2018-01-09 ENCOUNTER — Other Ambulatory Visit: Payer: Self-pay | Admitting: Internal Medicine

## 2018-01-09 DIAGNOSIS — G8929 Other chronic pain: Secondary | ICD-10-CM

## 2018-01-09 DIAGNOSIS — M542 Cervicalgia: Principal | ICD-10-CM

## 2018-01-09 DIAGNOSIS — R937 Abnormal findings on diagnostic imaging of other parts of musculoskeletal system: Secondary | ICD-10-CM

## 2018-01-12 ENCOUNTER — Ambulatory Visit: Payer: Managed Care, Other (non HMO)

## 2018-02-01 ENCOUNTER — Encounter: Payer: Self-pay | Admitting: Obstetrics & Gynecology

## 2018-02-04 ENCOUNTER — Other Ambulatory Visit: Payer: Self-pay

## 2018-02-04 ENCOUNTER — Other Ambulatory Visit: Payer: Self-pay | Admitting: Obstetrics & Gynecology

## 2018-02-04 MED ORDER — ESTRADIOL 0.05 MG/24HR TD PTWK: 0.0500 mg | MEDICATED_PATCH | TRANSDERMAL | 11 refills | Status: DC

## 2018-02-28 ENCOUNTER — Encounter: Payer: Self-pay | Admitting: Internal Medicine

## 2018-03-04 ENCOUNTER — Other Ambulatory Visit: Payer: Self-pay | Admitting: Internal Medicine

## 2018-03-04 ENCOUNTER — Encounter: Payer: Self-pay | Admitting: Internal Medicine

## 2018-03-04 MED ORDER — BUPROPION HCL ER (XL) 300 MG PO TB24: 300.0000 mg | ORAL_TABLET | Freq: Every day | ORAL | 3 refills | Status: DC

## 2018-03-28 ENCOUNTER — Ambulatory Visit: Payer: Managed Care, Other (non HMO) | Admitting: Internal Medicine

## 2018-03-28 ENCOUNTER — Encounter: Payer: Self-pay | Admitting: Internal Medicine

## 2018-03-28 VITALS — BP 118/80 | HR 66 | Temp 98.1°F | Ht 63.0 in | Wt 179.4 lb

## 2018-03-28 DIAGNOSIS — G4733 Obstructive sleep apnea (adult) (pediatric): Secondary | ICD-10-CM | POA: Diagnosis not present

## 2018-03-28 DIAGNOSIS — M1990 Unspecified osteoarthritis, unspecified site: Secondary | ICD-10-CM | POA: Diagnosis not present

## 2018-03-28 DIAGNOSIS — E669 Obesity, unspecified: Secondary | ICD-10-CM | POA: Diagnosis not present

## 2018-03-28 DIAGNOSIS — Z23 Encounter for immunization: Secondary | ICD-10-CM | POA: Diagnosis not present

## 2018-03-28 DIAGNOSIS — M25551 Pain in right hip: Secondary | ICD-10-CM | POA: Diagnosis not present

## 2018-03-28 DIAGNOSIS — R937 Abnormal findings on diagnostic imaging of other parts of musculoskeletal system: Secondary | ICD-10-CM

## 2018-03-28 DIAGNOSIS — M25552 Pain in left hip: Secondary | ICD-10-CM

## 2018-03-28 HISTORY — DX: Abnormal findings on diagnostic imaging of other parts of musculoskeletal system: R93.7

## 2018-03-28 MED ORDER — PHENTERMINE HCL 37.5 MG PO TABS: 37.5000 mg | ORAL_TABLET | Freq: Every day | ORAL | 0 refills | Status: DC

## 2018-03-28 MED ORDER — CELECOXIB 100 MG PO CAPS: 100.0000 mg | ORAL_CAPSULE | Freq: Every day | ORAL | 3 refills | Status: DC

## 2018-03-28 NOTE — Progress Notes (Addendum)
Chief Complaint  Patient presents with  . Sleep Apnea   F/u  1. C/o b/l hip pain Xrays 2/819 negative tried PT did not help  2. cervalgia seeing NS Dr. Electa Sniff who rec surgery pt hesistant will disc with pain doc about steroid inj  3. Wants to get 2nd shingrix today  4. Filled out work physical form  5. C/o cpap make louder noise and having interrupted sleep and waking up several times throughout the night    Review of Systems  Constitutional: Negative for weight loss.  HENT: Negative for hearing loss.   Eyes: Negative for blurred vision.  Cardiovascular: Negative for chest pain.  Musculoskeletal: Positive for joint pain.  Skin: Negative for rash.  Psychiatric/Behavioral: Negative for depression. The patient has insomnia.    Past Medical History:  Diagnosis Date  . Anxiety   . ASCUS with positive high risk HPV cervical   . CAD (coronary artery disease)   . Chicken pox   . Chronic pain    neck, back, b/l hips   . Depression   . GERD (gastroesophageal reflux disease)   . History of Crohn's disease   . Hyperlipidemia   . Left ovarian cyst    s/p removal of 1 ovary ? which one removed per pt   . Libido, decreased   . Thyroid nodule    Past Surgical History:  Procedure Laterality Date  . ABDOMINAL HYSTERECTOMY    . APPENDECTOMY  2004  . BLADDER SURGERY    . BREAST BIOPSY     x 2  . COLONOSCOPY    . OOPHORECTOMY     x1 ? which one removed per pt   . WISDOM TOOTH EXTRACTION     Family History  Problem Relation Age of Onset  . Alcohol abuse Mother   . Hyperlipidemia Mother   . Heart disease Mother   . Hypertension Mother   . Diabetes Mother   . Crohn's disease Sister        1/2 sister  . Depression Sister   . Leukemia Paternal Grandmother   . Cancer Paternal Aunt        blood, type unknown   Social History   Socioeconomic History  . Marital status: Married    Spouse name: douglas  . Number of children: 0  . Years of education: Not on file  . Highest  education level: Associate degree: occupational, Hotel manager, or vocational program  Occupational History  . Occupation: Public relations account executive    Comment: full time  Social Needs  . Financial resource strain: Not hard at all  . Food insecurity:    Worry: Never true    Inability: Never true  . Transportation needs:    Medical: No    Non-medical: No  Tobacco Use  . Smoking status: Former Smoker    Types: Cigarettes    Last attempt to quit: 08/29/2000    Years since quitting: 17.5  . Smokeless tobacco: Never Used  Substance and Sexual Activity  . Alcohol use: No    Alcohol/week: 0.0 standard drinks    Comment: social   . Drug use: No  . Sexual activity: Not Currently  Lifestyle  . Physical activity:    Days per week: 2 days    Minutes per session: 20 min  . Stress: Only a little  Relationships  . Social connections:    Talks on phone: Never    Gets together: Never    Attends religious service: Never    Active  member of club or organization: No    Attends meetings of clubs or organizations: Never    Relationship status: Married  . Intimate partner violence:    Fear of current or ex partner: No    Emotionally abused: No    Physically abused: No    Forced sexual activity: No  Other Topics Concern  . Not on file  Social History Narrative   Married    Works [employer]    Current Meds  Medication Sig  . aluminum-magnesium hydroxide-simethicone (MAALOX) [phone] MG/5ML SUSP Take 30 mLs by mouth 4 (four) times daily -  before meals and at bedtime.  . Ascorbic Acid (VITAMIN C) 1000 MG tablet Take 1,000 mg by mouth daily.   Marland Kitchen aspirin EC 81 MG tablet Take 81 mg by mouth daily.  Marland Kitchen atorvastatin (LIPITOR) 10 MG tablet TAKE 1 TABLET BY MOUTH DAILY  . buPROPion (WELLBUTRIN XL) 300 MG 24 hr tablet Take 1 tablet (300 mg total) by mouth daily.  . DOCOSAHEXAENOIC ACID PO Take by mouth.  . docusate sodium (DOK) 100 MG capsule TAKE 1 CAPSULE(100 MG) BY MOUTH TWICE DAILY  . estradiol (CLIMARA -  DOSED IN MG/24 HR) 0.05 mg/24hr patch Place 1 patch (0.05 mg total) onto the skin once a week.  . famotidine (PEPCID) 20 MG tablet Take 1 tablet (20 mg total) by mouth 2 (two) times daily.  . fentaNYL (DURAGESIC - DOSED MCG/HR) 25 MCG/HR patch Place 25 mcg onto the skin every 3 (three) days.   Marland Kitchen HYDROcodone-acetaminophen (NORCO) 10-325 MG tablet Take 1 tablet by mouth every 6 (six) hours as needed.   . lidocaine (LIDODERM) 5 % Place 2 patches onto the skin daily.   . Multiple Vitamin (MULTI-VITAMINS) TABS Take 1 tablet by mouth daily.   . mupirocin nasal ointment (BACTROBAN NASAL) 2 % Bid nose right x 5 days  . Omega-3 Fatty Acids (FISH OIL PO) Take 1 capsule by mouth daily.   Marland Kitchen omeprazole (PRILOSEC) 20 MG capsule Take 1 capsule (20 mg total) by mouth daily. In am before food 30 minutes  . ondansetron (ZOFRAN ODT) 4 MG disintegrating tablet Take 1 tablet (4 mg total) by mouth every 8 (eight) hours as needed for nausea or vomiting.  . promethazine (PHENERGAN) 25 MG tablet TAKE 1 TABLET(25 MG) BY MOUTH EVERY 8 HOURS AS NEEDED FOR NAUSEA OR VOMITING  . sertraline (ZOLOFT) 50 MG tablet Take 1 tablet (50 mg total) by mouth daily with breakfast.  . traZODone (DESYREL) 50 MG tablet Take 0.5-1 tablets (25-50 mg total) by mouth at bedtime as needed for sleep.  . Turmeric Curcumin 500 MG CAPS Take by mouth.  . valACYclovir (VALTREX) 1000 MG tablet   . [DISCONTINUED] cloNIDine (CATAPRES - DOSED IN MG/24 HR) 0.1 mg/24hr patch Place 1 patch (0.1 mg total) onto the skin once a week.   Allergies  Allergen Reactions  . Propofol Palpitations    BP drops and EKG changes, bladder retention  . Nsaids Other (See Comments)    Flare of Crohn's.  . Pentasa [Mesalamine] Other (See Comments)    Chest pain.   No results found for this or any previous visit (from the past 2160 hour(s)). Objective  Body mass index is 31.78 kg/m. Wt Readings from Last 3 Encounters:  03/28/18 179 lb 6.4 oz (81.4 kg)  11/26/17 174  lb 6 oz (79.1 kg)  10/09/17 172 lb (78 kg)   Temp Readings from Last 3 Encounters:  03/28/18 98.1 F (36.7 C) (  Oral)  11/26/17 (!) 97.5 F (36.4 C) (Oral)  10/09/17 98.6 F (37 C) (Oral)   BP Readings from Last 3 Encounters:  03/28/18 118/80  11/26/17 118/70  10/09/17 133/89   Pulse Readings from Last 3 Encounters:  03/28/18 66  11/26/17 71  10/09/17 72    Physical Exam  Constitutional: She is oriented to person, place, and time. Vital signs are normal. She appears well-developed and well-nourished. She is cooperative.  HENT:  Head: Normocephalic and atraumatic.  Mouth/Throat: Oropharynx is clear and moist and mucous membranes are normal.  Eyes: Pupils are equal, round, and reactive to light. Conjunctivae are normal.  Cardiovascular: Normal rate, regular rhythm and normal heart sounds.  Pulmonary/Chest: Effort normal and breath sounds normal.  Neurological: She is alert and oriented to person, place, and time. Gait normal.  Skin: Skin is warm, dry and intact.     Psychiatric: She has a normal mood and affect. Her speech is normal and behavior is normal. Judgment and thought content normal. Cognition and memory are normal.  Nursing note and vitals reviewed.   Assessment   1. B/l hip pain Xrays neg PT failed, OA 2. cervicalcalgia abnormal MRI C spine f/u Dr. Electa Sniff  3. osa on cpap  4. HM Plan   1. Mri b/l hips  If + f/u ortho  Resume celebrex 100 mg qd  2. Consider steroid neck with pain clinic  3. Re ordered sleep study with sleep med  Neg narcolepsy 09/20/12  4.  Will get flu and hep B f/u will be 3/3 vx  shingrix given today 2/2  utd Tdap   Sp partial hysterectomy. H/o Pap ASCUS 07/19/17 f/u with Dr. Kenton Kingfisher he wants to f/u in 1 year  Colonoscopy pt wants to hold as disc prev visits will keep encouraging given h/o Crohns per hx, FH polpys  Mammogram 06/26/17 neg refer at f/u  Dermatology saw recently Dr. Kellie Moor 07/2018  BMI >31 will do adipex 37.5 x 2  months and f/u in 2 months lifestyle changes   Work labs 01/24/18 TC 163, HDL 40, LDL 86, TG 185, glucose 68, BP 124/87 BMI 31.30 waist 30 A1C 5.4%  Saw pain clinic 05/10/18 f/u in 3 months lumbar RFA right then left, consult ortho, topamax 25 mg bid referral wt loss clinic, fentanyl 25 mcg x 3 months percocet 10-325 mg qd prn celebrex 200 mg qd,   Dr. Roland Rack saw 05/10/18 rec PT steroid inj and f/u in 2 months   Provider: Dr. Olivia Mackie [name]

## 2018-03-28 NOTE — Patient Instructions (Addendum)
F/u in 2 months  Take care   Exercising to Lose Weight Exercising can help you to lose weight. In order to lose weight through exercise, you need to do vigorous-intensity exercise. You can tell that you are exercising with vigorous intensity if you are breathing very hard and fast and cannot hold a conversation while exercising. Moderate-intensity exercise helps to maintain your current weight. You can tell that you are exercising at a moderate level if you have a higher heart rate and faster breathing, but you are still able to hold a conversation. How often should I exercise? Choose an activity that you enjoy and set realistic goals. Your health care provider can help you to make an activity plan that works for you. Exercise regularly as directed by your health care provider. This may include:  Doing resistance training twice each week, such as: ? Push-ups. ? Sit-ups. ? Lifting weights. ? Using resistance bands.  Doing a given intensity of exercise for a given amount of time. Choose from these options: ? 150 minutes of moderate-intensity exercise every week. ? 75 minutes of vigorous-intensity exercise every week. ? A mix of moderate-intensity and vigorous-intensity exercise every week.  Children, pregnant women, people who are out of shape, people who are overweight, and older adults may need to consult a health care provider for individual recommendations. If you have any sort of medical condition, be sure to consult your health care provider before starting a new exercise program. What are some activities that can help me to lose weight?  Walking at a rate of at least 4.5 miles an hour.  Jogging or running at a rate of 5 miles per hour.  Biking at a rate of at least 10 miles per hour.  Lap swimming.  Roller-skating or in-line skating.  Cross-country skiing.  Vigorous competitive sports, such as football, basketball, and soccer.  Jumping rope.  Aerobic dancing. How can I be  more active in my day-to-day activities?  Use the stairs instead of the elevator.  Take a walk during your lunch break.  If you drive, park your car farther away from work or school.  If you take public transportation, get off one stop early and walk the rest of the way.  Make all of your phone calls while standing up and walking around.  Get up, stretch, and walk around every 30 minutes throughout the day. What guidelines should I follow while exercising?  Do not exercise so much that you hurt yourself, feel dizzy, or get very short of breath.  Consult your health care provider prior to starting a new exercise program.  Wear comfortable clothes and shoes with good support.  Drink plenty of water while you exercise to prevent dehydration or heat stroke. Body water is lost during exercise and must be replaced.  Work out until you breathe faster and your heart beats faster. This information is not intended to replace advice given to you by your health care provider. Make sure you discuss any questions you have with your health care provider. Document Released: 08/19/2010 Document Revised: 12/23/2015 Document Reviewed: 12/18/2013 Elsevier Interactive Patient Education  2018 [employer].  Live Zoster (Shingles) Vaccine, ZVL: What You Need to Know 1. What is shingles? Shingles (also called herpes zoster, or just zoster) is a painful skin rash, often with blisters. Shingles is caused by the varicella zoster virus, the same virus that causes chickenpox. After you have chickenpox, the virus stays in your body and can cause shingles later in  life. You can't catch shingles from another person. However, a person who has never had chickenpox (or chickenpox vaccine) could get chickenpox from someone with shingles. A shingles rash usually appears on one side of the face or body and heals within 2 to 4 weeks. Its main symptom is pain, which can be severe. Other symptoms can include fever,  headache, chills, and upset stomach. Very rarely, a shingles infection can lead to pneumonia, hearing problems, blindness, brain inflammation (encephalitis), or death. For about 1 person in 5, severe pain can continue even long after the rash has cleared up. This long-lasting pain is called post-herpetic neuralgia (PHN). Shingles is far more common in people 62 years of age and older than in younger people, and the risk increases with age. It is also more common in people whose immune system is weakened because of a disease such as cancer or by drugs such as steroids or chemotherapy. At least 1 million people a year in the [place] get shingles. 2. Shingles vaccine (live) A live shingles vaccine was approved by FDA in 2006. In a clinical trial, the vaccine reduced the risk of shingles by about 50% in people 60 and older. It can reduce the likelihood of PHN, and reduce pain in some people who still get shingles after being vaccinated. The recommended schedule for live shingles vaccine is a single dose for adults 66 years of age and older. 3. Some people should not get this vaccine Tell your vaccine provider if you:  Have any severe, life-threatening allergies. A person who has ever had a life-threatening allergic reaction after a dose of live shingles vaccine, or has a severe allergy to any component of this vaccine, may be advised not to be vaccinated. Ask your health care provider if you want information about vaccine components.  Are pregnant, or think you might be pregnant. Pregnant women should wait to get live shingles vaccine until they are no longer pregnant. Women should avoid getting pregnant for at least 1 month after getting shingles vaccine.  Have a weakened immune system due to disease (such as cancer or AIDS) or medical treatments (such as radiation, immunotherapy, high-dose steroids, or chemotherapy).  Are not feeling well. If you have a mild illness, such as a cold, you can  probably get the vaccine today. If you are moderately or severely ill, you should probably wait until you recover. Your doctor can advise you.  4. Risks of a vaccine reaction With any medicine, including vaccines, there is a chance of reactions. After live shingles vaccination, a person might experience:  Redness, soreness, swelling, or itching at the site of the injection  Headache  These events are usually mild and go away on their own. Rarely, live shingles vaccine can cause rash or shingles. Other things that could happen after this vaccine:  People sometimes faint after medical procedures, including vaccination. Sitting or lying down for about 15 minutes can help prevent fainting and injuries caused by a fall. Tell your provider if you feel dizzy or have vision changes or ringing in the ears.  Some people get shoulder pain that can be more severe and longer-lasting than routine soreness that can follow injections. This happens very rarely.  Any medication can cause a severe allergic reaction. Such reactions to a vaccine are estimated at about 1 in a million doses, and would happen within a few minutes to a few hours after the vaccination. As with any medicine, there is a very remote chance of  a vaccine causing a serious injury or death. The safety of vaccines is always being monitored. For more information, visit: http://www.aguilar.org/ 5. What if there is a serious problem? What should I look for?  Look for anything that concerns you, such as signs of a severe allergic reaction, very high fever, or unusual behavior. Signs of a severe allergic reaction can include hives, swelling of the face and throat, difficulty breathing, a fast heartbeat, dizziness, and weakness. These would usually start a few minutes to a few hours after the vaccination. What should I do?  If you think it is a severe allergic reaction or other emergency that can't wait, call 9-1-1 and get to the nearest  hospital. Otherwise, call your health care provider.  Afterward, the reaction should be reported to the Vaccine Adverse Event Reporting System (VAERS). Your doctor should file this report, or you can do it yourself through the VAERS web site at www.vaers.SamedayNews.es or by calling (618)416-3257. VAERS does not give medical advice. 6. How can I learn more?  Ask your healthcare provider. He or she can give you the vaccine package insert or suggest other sources of information.  Call your local or state health department.  Contact the Centers for Disease Control and Prevention (CDC): ? Call 517-310-0716(1-800-CDC-INFO) or ? Visit CDC's website at http://hunter.com/ [place] (VIS) Live Zoster Vaccine (09/11/2016) This information is not intended to replace advice given to you by your health care provider. Make sure you discuss any questions you have with your health care provider. Document Released: 05/14/2006 Document Revised: 09/26/2016 Document Reviewed: 09/26/2016 Elsevier Interactive Patient Education  2018 [employer]. Phentermine tablets or capsules What is this medicine? PHENTERMINE (FEN ter meen) decreases your appetite. It is used with a reduced calorie diet and exercise to help you lose weight. This medicine may be used for other purposes; ask your health care provider or pharmacist if you have questions. COMMON BRAND NAME(S): Adipex-P, Atti-Plex P, Atti-Plex P Spansule, Fastin, Lomaira, Pro-Fast, Tara-8 What should I tell my health care provider before I take this medicine? They need to know if you have any of these conditions: -agitation -glaucoma -heart disease -high blood pressure -history of substance abuse -lung disease called Primary Pulmonary Hypertension (PPH) -taken an MAOI like Carbex, Eldepryl, Marplan, Nardil, or Parnate in last 14 days -thyroid disease -an unusual or allergic reaction to phentermine, other medicines, foods, dyes, or  preservatives -pregnant or trying to get pregnant -breast-feeding How should I use this medicine? Take this medicine by mouth with a glass of water. Follow the directions on the prescription label. The instructions for use may differ based on the product and dose you are taking. Avoid taking this medicine in the evening. It may interfere with sleep. Take your doses at regular intervals. Do not take your medicine more often than directed. Talk to your pediatrician regarding the use of this medicine in children. While this drug may be prescribed for children 17 years or older for selected conditions, precautions do apply. Overdosage: If you think you have taken too much of this medicine contact a poison control center or emergency room at once. NOTE: This medicine is only for you. Do not share this medicine with others. What if I miss a dose? If you miss a dose, take it as soon as you can. If it is almost time for your next dose, take only that dose. Do not take double or extra doses. What may interact with this medicine? Do not take  this medicine with any of the following medications: -duloxetine -MAOIs like Carbex, Eldepryl, Marplan, Nardil, and Parnate -medicines for colds or breathing difficulties like pseudoephedrine or phenylephrine -procarbazine -sibutramine -SSRIs like citalopram, escitalopram, fluoxetine, fluvoxamine, paroxetine, and sertraline -stimulants like dexmethylphenidate, methylphenidate or modafinil -venlafaxine This medicine may also interact with the following medications: -medicines for diabetes This list may not describe all possible interactions. Give your health care provider a list of all the medicines, herbs, non-prescription drugs, or dietary supplements you use. Also tell them if you smoke, drink alcohol, or use illegal drugs. Some items may interact with your medicine. What should I watch for while using this medicine? Notify your physician immediately if you  become short of breath while doing your normal activities. Do not take this medicine within 6 hours of bedtime. It can keep you from getting to sleep. Avoid drinks that contain caffeine and try to stick to a regular bedtime every night. This medicine was intended to be used in addition to a healthy diet and exercise. The best results are achieved this way. This medicine is only indicated for short-term use. Eventually your weight loss may level out. At that point, the drug will only help you maintain your new weight. Do not increase or in any way change your dose without consulting your doctor. You may get drowsy or dizzy. Do not drive, use machinery, or do anything that needs mental alertness until you know how this medicine affects you. Do not stand or sit up quickly, especially if you are an older patient. This reduces the risk of dizzy or fainting spells. Alcohol may increase dizziness and drowsiness. Avoid alcoholic drinks. What side effects may I notice from receiving this medicine? Side effects that you should report to your doctor or health care professional as soon as possible: -chest pain, palpitations -depression or severe changes in mood -increased blood pressure -irritability -nervousness or restlessness -severe dizziness -shortness of breath -problems urinating -unusual swelling of the legs -vomiting Side effects that usually do not require medical attention (report to your doctor or health care professional if they continue or are bothersome): -blurred vision or other eye problems -changes in sexual ability or desire -constipation or diarrhea -difficulty sleeping -dry mouth or unpleasant taste -headache -nausea This list may not describe all possible side effects. Call your doctor for medical advice about side effects. You may report side effects to FDA at 1-800-FDA-1088. Where should I keep my medicine? Keep out of the reach of children. This medicine can be abused. Keep  your medicine in a safe place to protect it from theft. Do not share this medicine with anyone. Selling or giving away this medicine is dangerous and against the law. This medicine may cause accidental overdose and death if taken by other adults, children, or pets. Mix any unused medicine with a substance like cat litter or coffee grounds. Then throw the medicine away in a sealed container like a sealed bag or a coffee can with a lid. Do not use the medicine after the expiration date. Store at room temperature between 20 and 25 degrees C (68 and 77 degrees F). Keep container tightly closed. NOTE: This sheet is a summary. It may not cover all possible information. If you have questions about this medicine, talk to your doctor, pharmacist, or health care provider.  2018 Elsevier/Gold Standard (2015-04-23 12:53:15)

## 2018-03-28 NOTE — Progress Notes (Signed)
Pre visit review using our clinic review tool, if applicable. No additional management support is needed unless otherwise documented below in the visit note. 

## 2018-04-06 ENCOUNTER — Other Ambulatory Visit: Payer: Self-pay | Admitting: Internal Medicine

## 2018-04-06 DIAGNOSIS — E785 Hyperlipidemia, unspecified: Secondary | ICD-10-CM

## 2018-04-06 MED ORDER — ATORVASTATIN CALCIUM 10 MG PO TABS: 10.0000 mg | ORAL_TABLET | Freq: Every day | ORAL | 3 refills | Status: DC

## 2018-04-14 ENCOUNTER — Encounter: Payer: Self-pay | Admitting: Internal Medicine

## 2018-04-15 ENCOUNTER — Other Ambulatory Visit: Payer: Self-pay | Admitting: Internal Medicine

## 2018-04-15 DIAGNOSIS — M1990 Unspecified osteoarthritis, unspecified site: Secondary | ICD-10-CM

## 2018-04-15 MED ORDER — CELECOXIB 100 MG PO CAPS: 100.0000 mg | ORAL_CAPSULE | Freq: Two times a day (BID) | ORAL | 3 refills | Status: DC

## 2018-04-16 ENCOUNTER — Encounter: Payer: Self-pay | Admitting: Internal Medicine

## 2018-04-17 ENCOUNTER — Ambulatory Visit
Admission: RE | Admit: 2018-04-17 | Discharge: 2018-04-17 | Disposition: A | Discharge status: Home / Self Care | Payer: Managed Care, Other (non HMO) | Source: Ambulatory Visit | Attending: Internal Medicine | Admitting: Internal Medicine

## 2018-04-17 DIAGNOSIS — M47817 Spondylosis without myelopathy or radiculopathy, lumbosacral region: Secondary | ICD-10-CM | POA: Insufficient documentation

## 2018-04-17 DIAGNOSIS — M25552 Pain in left hip: Principal | ICD-10-CM

## 2018-04-17 DIAGNOSIS — S43431A Superior glenoid labrum lesion of right shoulder, initial encounter: Secondary | ICD-10-CM | POA: Insufficient documentation

## 2018-04-17 DIAGNOSIS — M25551 Pain in right hip: Secondary | ICD-10-CM

## 2018-04-17 DIAGNOSIS — M5137 Other intervertebral disc degeneration, lumbosacral region: Secondary | ICD-10-CM | POA: Insufficient documentation

## 2018-04-17 DIAGNOSIS — M7061 Trochanteric bursitis, right hip: Secondary | ICD-10-CM | POA: Diagnosis not present

## 2018-04-17 DIAGNOSIS — K5730 Diverticulosis of large intestine without perforation or abscess without bleeding: Secondary | ICD-10-CM | POA: Diagnosis not present

## 2018-04-17 DIAGNOSIS — N898 Other specified noninflammatory disorders of vagina: Secondary | ICD-10-CM | POA: Diagnosis not present

## 2018-04-17 DIAGNOSIS — S76011A Strain of muscle, fascia and tendon of right hip, initial encounter: Secondary | ICD-10-CM | POA: Diagnosis not present

## 2018-04-17 DIAGNOSIS — X58XXXA Exposure to other specified factors, initial encounter: Secondary | ICD-10-CM | POA: Insufficient documentation

## 2018-04-18 ENCOUNTER — Encounter: Payer: Self-pay | Admitting: Diagnostic Radiology

## 2018-04-18 ENCOUNTER — Encounter: Payer: Self-pay | Admitting: Obstetrics & Gynecology

## 2018-04-18 ENCOUNTER — Encounter: Payer: Self-pay | Admitting: Internal Medicine

## 2018-04-18 NOTE — Telephone Encounter (Signed)
Do you know anything about this? Melissa

## 2018-04-19 ENCOUNTER — Other Ambulatory Visit: Payer: Self-pay | Admitting: Internal Medicine

## 2018-04-19 DIAGNOSIS — M25552 Pain in left hip: Principal | ICD-10-CM

## 2018-04-19 DIAGNOSIS — M25551 Pain in right hip: Secondary | ICD-10-CM

## 2018-04-19 NOTE — Telephone Encounter (Signed)
Sharyn Lull from [facility] is calling in requesting clinicals for sleep study to be faxed over  Fax# [phone]

## 2018-04-24 ENCOUNTER — Other Ambulatory Visit: Payer: Self-pay | Admitting: Internal Medicine

## 2018-04-24 DIAGNOSIS — F32A Depression, unspecified: Secondary | ICD-10-CM

## 2018-04-24 DIAGNOSIS — F329 Major depressive disorder, single episode, unspecified: Secondary | ICD-10-CM

## 2018-04-24 MED ORDER — SERTRALINE HCL 50 MG PO TABS: 50.0000 mg | ORAL_TABLET | Freq: Every day | ORAL | 3 refills | Status: DC

## 2018-04-24 MED ORDER — OMEPRAZOLE 20 MG PO CPDR: 20.0000 mg | DELAYED_RELEASE_CAPSULE | Freq: Every day | ORAL | 3 refills | Status: DC

## 2018-05-04 ENCOUNTER — Encounter: Payer: Self-pay | Admitting: Internal Medicine

## 2018-05-09 NOTE — Telephone Encounter (Signed)
Do either of you know anything about this?  

## 2018-05-10 DIAGNOSIS — IMO0002 Reserved for concepts with insufficient information to code with codable children: Secondary | ICD-10-CM | POA: Insufficient documentation

## 2018-05-10 DIAGNOSIS — S76011A Strain of muscle, fascia and tendon of right hip, initial encounter: Secondary | ICD-10-CM | POA: Insufficient documentation

## 2018-05-10 DIAGNOSIS — M76891 Other specified enthesopathies of right lower limb, excluding foot: Secondary | ICD-10-CM | POA: Insufficient documentation

## 2018-05-10 HISTORY — DX: Strain of muscle, fascia and tendon of right hip, initial encounter: S76.011A

## 2018-05-27 ENCOUNTER — Other Ambulatory Visit: Payer: Self-pay | Admitting: Internal Medicine

## 2018-05-27 DIAGNOSIS — G4733 Obstructive sleep apnea (adult) (pediatric): Secondary | ICD-10-CM

## 2018-05-27 NOTE — Telephone Encounter (Signed)
Dr. Suzan Garibaldi pt's response below. [facility] would have to resubmit to [place]. Should I try to get her a home sleep test? How would you like me to proceed? Thanks [employer]

## 2018-05-28 ENCOUNTER — Other Ambulatory Visit: Payer: Self-pay

## 2018-05-28 ENCOUNTER — Ambulatory Visit: Payer: Managed Care, Other (non HMO) | Attending: Surgery | Admitting: Physical Therapy

## 2018-05-28 ENCOUNTER — Encounter: Payer: Self-pay | Admitting: Physical Therapy

## 2018-05-28 DIAGNOSIS — M545 Low back pain, unspecified: Secondary | ICD-10-CM

## 2018-05-28 DIAGNOSIS — G8929 Other chronic pain: Secondary | ICD-10-CM | POA: Insufficient documentation

## 2018-05-28 DIAGNOSIS — M25551 Pain in right hip: Secondary | ICD-10-CM | POA: Insufficient documentation

## 2018-05-28 DIAGNOSIS — M6281 Muscle weakness (generalized): Secondary | ICD-10-CM | POA: Diagnosis present

## 2018-05-28 DIAGNOSIS — M25552 Pain in left hip: Secondary | ICD-10-CM | POA: Diagnosis present

## 2018-05-28 DIAGNOSIS — M542 Cervicalgia: Secondary | ICD-10-CM | POA: Diagnosis present

## 2018-05-28 NOTE — Therapy (Signed)
[facility] [address], [place], [address] Phone: [phone]   Fax:  [phone]  Physical Therapy Evaluation  Patient Details  Name: Carolyn Esparza MRN: [id number] Date of Birth: September 07, 1965 Referring Provider (PT): Poggi, Smith Mince, MD   Encounter Date: 05/28/2018  PT End of Session - 05/28/18 1757    Visit Number  1    Number of Visits  17    Date for PT Re-Evaluation  07/23/18    Authorization Type  [employer]    Authorization Time Period  Current cert period: 73/71/0626 - 07/23/2018 (last PN: IE 05/28/2018)    Authorization - Visit Number  1    Authorization - Number of Visits  10    PT Start Time  9485    PT Stop Time  1710    PT Time Calculation (min)  65 min    Activity Tolerance  Patient tolerated treatment well;No increased pain;Patient limited by fatigue    Behavior During Therapy  [facility] for tasks assessed/performed       Past Medical History:  Diagnosis Date  . Anxiety   . ASCUS with positive high risk HPV cervical   . CAD (coronary artery disease)   . Chicken pox   . Chronic pain    neck, back, b/l hips   . Depression   . GERD (gastroesophageal reflux disease)   . History of Crohn's disease   . Hyperlipidemia   . Left ovarian cyst    s/p removal of 1 ovary ? which one removed per pt   . Libido, decreased   . Skin cancer    reports skin cancer removed from skin in 2016 or 2017  . Thyroid nodule     Past Surgical History:  Procedure Laterality Date  . ABDOMINAL HYSTERECTOMY    . APPENDECTOMY  2004  . BLADDER SURGERY    . BREAST BIOPSY     x 2  . COLONOSCOPY    . OOPHORECTOMY Right    Unilateral Right (per MRI 04/17/2018)  . WISDOM TOOTH EXTRACTION      There were no vitals filed for this visit.  SUBJECTIVE: HISTORY:  Patient is a 52 y.o. female who presents to outpatient physical therapy with a referral for strain of right hip, partial glute med tear in right hip. This  patient's chief complaints consist of pain, activity intolerance, concern about injuring it further, leading to the following functional deficits: difficulty with walking, standing, bending, lifting, climbing stairs, reaching feet, participating in valued activities such as gardening, hiking, traveling, exercise and physical activity. Relevant past medical history and comorbidities include chronic low back pain with treatment at pain clinic and periodic injections, obesity, skin cancer removal 2-3 years ago, anxiety/depression (currently satisfied with level of care), chron's disease, chronic pain, coronary artery disease, formor smoker. Current medications include see list in chart.  Imaging: MRI report from 04/18/2018 describing right partial glute med tear. Response to previously administered skilled services: previous PT for back, hip, and neck with minimal response reported by patient.  Patient reports her pain started gradually over many years without identifiable injury. She has had back problems for 15 plus years and her hip started bothering her 6 or so years ago but it was not a constant bother until this year. This February she asked for a radiograph for her hip due to increased pain. She did some PT at that time for neck, back, and hip without much improvement and the  hip seems to have gotten worse in the last year. She is not sure if is related to her back but the MRI showed a torn tendon.   She is usually limited by low back pain but walks on the treadmill at the gym and stretching on a yoga mat. Ever since the week she had the MRI (as a coincidence, performed on 04/18/2018) she hasn't done much of anything due to hip pain worsening to the point she was limping. She requested some celebrex at that point which has helped with the limping but she is still feeling the pain. Her orthopedist said something about surgery and she said "uh uh". She states she got hip and back injections in February and  July and they were the most effective she has ever had. They helped the pain in her back, neck and hip.  Spinal History: she has had pain for 15 years. Being treated by pain clinic for years. Several times to PT. Has tried some swimming when she feels up to it. Unsure of cause. Stood on concrete floor for 13 years and in a couple of bad accidents. She has had multiple MRIs that show different things each time. She reports no history of leg symptoms from her back. The last 2-3 months if she lays on her left side her right leg will just achy and it feels sort of like a nerve ache to her right toes. It goes away when she gets up and moves around. It is also provoked at a certain angle of recliner. She also has a tingling  In her right side of her neck and face intermittent multiple times a day (increased in frequency from every two or three days). She has been told she has neck problems. She has deformities in both elbows. She reports intermittent tingling in both hands (while typing). She has adapted to use mouse with left hand.    FUNCTION Patient-reported Outcome Measure: FOTO = 48; LEFS = 28/80. Work: Oncologist full time, on call at times; usual first shift (mostly desk work). Hobbies: caring for 76 year old dog, travel some, ride in convertible, watch movies. PLOF: limited due to back but prior to back pain was unlimited in her ability to complete desired tasks and activities.  Current Level of Function: unable to garden (flowers, vegetables), unable to hike, unable to exercise more. Requires help from husband for many household tasks.Marland Kitchen  PAIN Location: right hip radiating down to lateral right knee and sometimes up to back. Nature: constant, dull constant ache.. Pain Scale:  Patient reports current pain as 7-8/10, at best 4/10, at worst 8/10, and during functional activity 6/10. Paresthesia: feeling like an achy nerve when laying on left side and certain positions of recliner. . 24-hour pattern:  worse during the day, feels better in the mornings.  SPECIAL SCREENING QUESTIONS Dizziness, double vision, difficulty swallowing, difficulty speaking, fainting spells or blackouts, facial numbness, or nausea: denies except intermittent right facial tingling (reported to MD by pt). Recent illness or fever: denies. Recent unexplained weight loss: denies. Night pain/sweats: denies. Recent bowel or bladder function abnormality: denies. Current symptoms/concerns not elsewhere described: denies.  Subjective Assessment - 05/28/18 1622    Subjective  Patient reports her pain started gradually over many years without identifiable injury. She has had back problems for 15 plus years and her hip started bothering her 6 or so years ago but it was not a constant bother until this year. This February she asked for a radiograph  for her hip due to increased pain. She did some PT at that time for neck, back, and hip without much improvement and the hip seems to have gotten worse in the last year. She is not sure if is related to her back but the MRI showed a torn tendon.    Pertinent History  Patient is a 52 y.o. female who presents to outpatient physical therapy with a referral for strain of right hip, partial glute med tear in right hip. This patient's chief complaints consist of pain, activity intolerance, concern about injuring it further, leading to the following functional deficits: difficulty with walking, standing, bending, lifting, climbing stairs, reaching feet, participating in valued activities such as gardening, hiking, traveling, exercise and physical activity.    Limitations  Standing;Walking;House hold activities    How long can you sit comfortably?  1.5 hours    How long can you stand comfortably?  15 min    How long can you walk comfortably?  10-15 min    Diagnostic tests  MRI, Radiographs (shows partial right glute med tear, see chart for more details).     Currently in Pain?  Yes    Pain Score   8     Pain Location  Hip    Pain Orientation  Right    Pain Descriptors / Indicators  Aching;Dull    Pain Type  Chronic pain    Pain Radiating Towards  radiates to lateral right thigh and around to right back at times.     Aggravating Factors   walking, standing, ascending stairs, lifting, reachind down to toes    Pain Relieving Factors  hot bath, celebrex, resting, ice packs, lidoderm patches.    Effect of Pain on Daily Activities  limites her in ADLs and IADLs.          [facility] PT Assessment - 05/28/18 0001      Assessment   Medical Diagnosis  right hip muscle strain   partial right gluteus medius tear   Referring Provider (PT)  Poggi, Smith Mince, MD    Onset Date/Surgical Date  04/18/18    Next MD Visit  07/05/2018    Prior Therapy  PT for hip, low back, and neck with minimal improvement reported by patient      Balance Screen   Has the patient fallen in the past 6 months  No    Has the patient had a decrease in activity level because of a fear of falling?   Yes    Is the patient reluctant to leave their home because of a fear of falling?   No      Home Environment   Living Environment  Private residence    Living Arrangements  Spouse/significant other    Available Help at Discharge  Family    Type of [place] Access  Level entry    [place]  Other (comment)   does not use     Prior Function   Level of Independence  Independent    Vocation  Full time employment    Vocation Requirements  desk work    Leisure  used to be able to hike, garden, exercise including yoga, help husband with household tasks. Now unable or limited due to pain and activity intolerance.       Cognition   Overall Cognitive Status  Within Functional Limits for tasks assessed  Observation/Other Assessments   Observations  See note from 05/28/2018 for latest objective measurements.         OBJECTIVE: OBSERVATION/INSPECTION: Patient presents with  significant left lumbar lateral shift. Leans away from left side more and more as time sitting goes on.  NEUROLOGICAL: Dermatomes: BLE WNL to light touch. Myotomes: BLE WNL. Reflexes:  - Quadriceps reflex (L4): R = 2+, L = absent. - Achilles reflex (S1): R = 1+, L = absent. Upper Motor Neuron Screen: Hoffman's and Clonus (at ankle) = negative Neurodynamic testing: Slump = negative bilaterally  SPINE MOTION Lumbar Spine AROM:  - Flexion: = toes to floor, 100% no pain. - Extension: = 25% pain.in back - Rotation: R= 75% pain R low back, L = 100% less pain right low back. - Side Flexion: R= 25% pain in back, L = 75% no pain. - Side glide: R = 0% OP no pian, L = 75% OP no pain.  PERIPHERAL JOINT MOTION (AROM/PROM in degrees): Hip WFL with overpressure and grossly equal bilaterally. Generally hypermobile in IR,ER, ABD, and ADD. Unrestricted in flexion. Pain with end range IR with OP (as below):  Marland Kitchen - External rotation (in prone): B = contacts other leg.  - Internal rotation with OP (in prone): R = 55, L = 55 (later hip pain) both sides with overpressur.  STRENGTH:  BLE strength WFL except Hip  - Abduction: R = 4/5 "feels weak", L = not tested in sidelying due to lack of tolerance for laying on right side.  - ER: R = 3/5, L = 4/5  SPECIAL TESTS: Slump: B = negative  ACCESSORY MOTION:  - CPA tender over lowest segments of lumbar spine but without reproduction of right hip pain.  PALPATION: - TTP at right gluteal notch, right greater trochanter  Objective measurements completed on examination: See above findings.      TREATMENT:   Therapeutic exercise: to centralize symptoms and improve ROM and strength required for successful completion of functional activities.  -Education on diagnosis, prognosis, POC, anatomy and physiology of current condition.  - Education on HEP including handout   - hooklying hip abduction with red band, 5 second hold, x10 - hooklying isometric hip  flexion (knee against hand), 5 second hold, x 10 - hooklying isometric hip adduction against ball, 5 second hold, x10 - hooklying isometric glute set, 5 second hold, x 10 - hooklying isometric hamstring curls, 5 second hold, x10  HOME EXERCISE PROGRAM Medbridge Access Code: [email or web address]  URL: [email or web address]  Date: 05/28/2018  Prepared by: Rosita Kea   Exercises  Hooklying Isometric Clamshell - 10-15 reps - 5 second hold - 1 Sets - 1x daily - 7x weekly  Hooklying Isometric Hip Flexion - 10-15 reps - 5 second hold - 1 Sets - 1x daily - 7x weekly  Supine Hip Adduction Isometric with Ball - 10-15 reps - 5 second hold - 1 Sets - 1x daily - 7x weekly  Hooklying Gluteal Sets - 10-15 reps - 5 second hold - 1 Sets - 1x daily - 7x weekly  Supine Bilateral Hamstring Sets - 10-15 reps - 5 second hold - 1 Sets - 1x daily - 7x weekly    Patient response to treatment:  Pt tolerated initial exam and treatment well. Pt was able to complete all exercises with minimal to no lasting increase in pain or discomfort. Pt required cuing for proper technique and to facilitate improved neuromuscular control, strength, range of motion, and functional ability.  PT Education -  05/28/18 1747    Education Details  Education on diagnosis, prognosis, POC, anatomy and physiology of current condition. Education on HEP including handout. Therex form/purpose. acceptable pain levels, meaning    Person(s) Educated  Patient    Methods  Explanation;Demonstration;Handout    Comprehension  Verbalized understanding;Returned demonstration       PT Short Term Goals - 05/28/18 1809      PT SHORT TERM GOAL #1   Title  Be independent with home exercise program completed at least 3 times per week for self-management of symptoms.    Baseline  initial HEP provided today    Time  2    Period  Weeks    Status  New    Target Date  06/11/18        PT Long Term Goals - 05/28/18 1809      PT LONG TERM GOAL #1    Title  Improve Lower Extremity Functional Scale score equal or greater than 50/80 to demonstrate improved self reported function.     Baseline  28/80 (05/28/2018);    Time  8    Period  Weeks    Target Date  06/05/18      PT LONG TERM GOAL #2   Title  Reduce pain with functional activities to equal or less than 3/10 to allow patient to complete usual activities including ADLs, IADLs, and social engagement with less difficulty.     Baseline  6/10 (05/28/2018);    Time  8    Period  Weeks    Status  New    Target Date  07/23/18      PT LONG TERM GOAL #4   Title  Pt will improve her neck FOTO score by at least 10 points as a demonstration of improved function.     Baseline  48 (05/28/2018);      PT LONG TERM GOAL #5   Title  Patient will be able to return to fitness program including walking for 30 min a day without limitation due to current condition.     Baseline  10-15 min walking comfortably (05/28/2018)    Time  8    Period  Weeks    Status  New    Target Date  07/23/18         Plan - 05/28/18 1759    Clinical Impression Statement   Patient is a 52 y.o. female  referred to outpatient physical therapy with a diagnosis of right hip strain/right glute med partial tear who presents with signs and symptoms consistent with chronic right hip pain, chronic low back pain, and activity intolerance.    History and Personal Factors relevant to plan of care:  chronic low back pain with treatment at pain clinic and periodic injections, obesity, skin cancer removal 2-3 years ago, anxiety/depression (currently satisfied with level of care), chron's disease, chronic pain, coronary artery disease, formor smoker.    Clinical Presentation  Evolving    Clinical Presentation due to:  patient describes condition as worsening, patient has multiple comorbidieis and personal factors, exam involved multiple systems and measurements    Clinical Decision Making  Moderate    Rehab Potential  Good     Clinical Impairments Affecting Rehab Potential  (+) realistic expectations, motivation. (-) chronic nature of condition, multiple comorbidities that also affect function.     PT Frequency  2x / week    PT Duration  8 weeks    PT Treatment/Interventions  ADLs/Self Care Home Management;Aquatic Therapy;Biofeedback;Dealer  Stimulation;Cryotherapy;Moist Heat;Gait training;Stair training;Functional mobility training;Therapeutic activities;Therapeutic exercise;Balance training;Neuromuscular re-education;Patient/family education;Manual techniques;Passive range of motion;Taping;Dry needling;Spinal Manipulations;Joint Manipulations;Other (comment)   joint mobilizations grades I-V   PT Next Visit Plan  further assessment and possible treatment of lumbar spine and further assessment of relationship to hip    PT Home Exercise Plan  Medbridge Access Code: [email or web address]     Consulted and Agree with Plan of Care  Patient      ASSESSMENT:  Patient is a 52 y.o. female  referred to outpatient physical therapy with a diagnosis of right hip strain/right glute med partial tear who presents with signs and symptoms consistent with chronic right hip pain, chronic low back pain, and activity intolerance.   Patient will benefit from skilled therapeutic intervention in order to improve the following deficits and impairments:  Improper body mechanics, Pain, Cardiopulmonary status limiting activity, Decreased mobility, Hypermobility, Increased muscle spasms, Postural dysfunction, Decreased activity tolerance, Decreased endurance, Decreased strength, Impaired perceived functional ability, Difficulty walking, Obesity  Visit Diagnosis: Pain in right hip  Chronic right-sided low back pain, unspecified whether sciatica present  Muscle weakness (generalized)     Problem List Patient Active Problem List   Diagnosis Date Noted  . Abnormal MRI, cervical spine 03/28/2018  . Menopause 11/26/2017  . Right upper quadrant pain  11/26/2017  . Abnormal Pap smear of cervix 07/27/2017  . Vasomotor flushing 07/19/2017  . Depression 07/18/2017  . Nausea 06/25/2017  . Hip pain, right 06/25/2017  . OSA (obstructive sleep apnea) 04/18/2017  . Annual physical exam 04/10/2017  . Fatigue 12/16/2016  . Chronic back pain 05/30/2016  . Abdominal pain 03/12/2016  . Anxiety and depression 12/28/2015  . History of obstructive sleep apnea 12/15/2015  . Thyroid nodule 12/15/2015  . GERD (gastroesophageal reflux disease) 12/15/2015  . Hyperlipidemia 12/15/2015  . Pain medication agreement signed 03/25/2015  . CAD in native artery 02/05/2015  . Right supraspinatus tenosynovitis 08/17/2014  . Crohn's disease involving terminal ileum ([place]) 03/30/2014  . FH: colon polyps 03/30/2014  . 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 Other bursitis disorders 07/12/2012  . Pain in joint, pelvic region and thigh 07/12/2012  . Crohn's disease ([place]) 01/23/2012    Nancy Nordmann, PT, DPT 05/28/2018, 6:14 PM  [facility] [address], [place], [address] Phone: [phone]   Fax:  [phone]  Name: Inas Avena MRN: [id number] Date of Birth: 05-Feb-1966

## 2018-05-30 ENCOUNTER — Encounter: Payer: Self-pay | Admitting: Internal Medicine

## 2018-05-30 ENCOUNTER — Ambulatory Visit: Payer: Managed Care, Other (non HMO) | Admitting: Physical Therapy

## 2018-05-30 ENCOUNTER — Ambulatory Visit: Payer: Managed Care, Other (non HMO) | Admitting: Internal Medicine

## 2018-05-30 ENCOUNTER — Encounter: Payer: Self-pay | Admitting: Physical Therapy

## 2018-05-30 VITALS — BP 128/86 | HR 78 | Temp 98.3°F | Ht 63.0 in | Wt 174.8 lb

## 2018-05-30 DIAGNOSIS — M25551 Pain in right hip: Secondary | ICD-10-CM

## 2018-05-30 DIAGNOSIS — E669 Obesity, unspecified: Secondary | ICD-10-CM

## 2018-05-30 DIAGNOSIS — Z8669 Personal history of other diseases of the nervous system and sense organs: Secondary | ICD-10-CM

## 2018-05-30 DIAGNOSIS — Z23 Encounter for immunization: Secondary | ICD-10-CM | POA: Diagnosis not present

## 2018-05-30 DIAGNOSIS — M6281 Muscle weakness (generalized): Secondary | ICD-10-CM

## 2018-05-30 DIAGNOSIS — M25552 Pain in left hip: Secondary | ICD-10-CM

## 2018-05-30 DIAGNOSIS — G8929 Other chronic pain: Secondary | ICD-10-CM

## 2018-05-30 DIAGNOSIS — M545 Low back pain, unspecified: Secondary | ICD-10-CM

## 2018-05-30 DIAGNOSIS — G4733 Obstructive sleep apnea (adult) (pediatric): Secondary | ICD-10-CM

## 2018-05-30 DIAGNOSIS — Z1231 Encounter for screening mammogram for malignant neoplasm of breast: Secondary | ICD-10-CM

## 2018-05-30 DIAGNOSIS — F329 Major depressive disorder, single episode, unspecified: Secondary | ICD-10-CM

## 2018-05-30 DIAGNOSIS — M542 Cervicalgia: Secondary | ICD-10-CM

## 2018-05-30 DIAGNOSIS — F32A Depression, unspecified: Secondary | ICD-10-CM

## 2018-05-30 DIAGNOSIS — R937 Abnormal findings on diagnostic imaging of other parts of musculoskeletal system: Secondary | ICD-10-CM

## 2018-05-30 MED ORDER — TIZANIDINE HCL 4 MG PO CAPS: 4.0000 mg | ORAL_CAPSULE | Freq: Every evening | ORAL | 0 refills | Status: DC | PRN

## 2018-05-30 MED ORDER — PHENTERMINE HCL 37.5 MG PO TABS: 37.5000 mg | ORAL_TABLET | Freq: Every day | ORAL | 0 refills | Status: DC

## 2018-05-30 MED ORDER — CELECOXIB 200 MG PO CAPS: 200.0000 mg | ORAL_CAPSULE | Freq: Every day | ORAL | 3 refills | Status: DC

## 2018-05-30 NOTE — Patient Instructions (Addendum)
Call to schedule mammogram due 06/26/18  Given hep B vaccine  Call back to schedule flu shot   Tizanidine tablets or capsules What is this medicine? TIZANIDINE (tye ZAN i deen) helps to relieve muscle spasms. It may be used to help in the treatment of multiple sclerosis and spinal cord injury. This medicine may be used for other purposes; ask your health care provider or pharmacist if you have questions. COMMON BRAND NAME(S): Zanaflex What should I tell my health care provider before I take this medicine? They need to know if you have any of these conditions: -kidney disease -liver disease -low blood pressure -mental disorder -an unusual or allergic reaction to tizanidine, other medicines, lactose (tablets only), foods, dyes, or preservatives -pregnant or trying to get pregnant -breast-feeding How should I use this medicine? Take this medicine by mouth with a full glass of water. Take this medicine on an empty stomach, at least 30 minutes before or 2 hours after food. Do not take with food unless you talk with your doctor. Follow the directions on the prescription label. Take your medicine at regular intervals. Do not take your medicine more often than directed. Do not stop taking except on your doctor's advice. Suddenly stopping the medicine can be very dangerous. Talk to your pediatrician regarding the use of this medicine in children. Patients over 52 years old may have a stronger reaction and need a smaller dose. Overdosage: If you think you have taken too much of this medicine contact a poison control center or emergency room at once. NOTE: This medicine is only for you. Do not share this medicine with others. What if I miss a dose? If you miss a dose, take it as soon as you can. If it is almost time for your next dose, take only that dose. Do not take double or extra doses. What may interact with this medicine? Do not take this medicine with any of the following  medications: -ciprofloxacin -cisapride -dofetilide -dronedarone -fluvoxamine -narcotic medicines for cough -pimozide -thiabendazole -thioridazine -ziprasidone This medicine may also interact with the following medications: -acyclovir -alcohol -antihistamines for allergy, cough and cold -baclofen -certain antibiotics like levofloxacin, ofloxacin -certain medicines for anxiety or sleep -certain medicines for blood pressure, heart disease, irregular heart beat -certain medicines for depression like amitriptyline, fluoxetine, sertraline -certain medicines for seizures like phenobarbital, primidone -certain medicines for stomach problems like cimetidine, famotidine -female hormones, like estrogens or progestins and birth control pills, patches, rings, or injections -general anesthetics like halothane, isoflurane, methoxyflurane, propofol -local anesthetics like lidocaine, pramoxine, tetracaine -medicines that relax muscles for surgery -narcotic medicines for pain -other medicines that prolong the QT interval (cause an abnormal heart rhythm) -phenothiazines like chlorpromazine, mesoridazine, prochlorperazine -ticlopidine -zileuton This list may not describe all possible interactions. Give your health care provider a list of all the medicines, herbs, non-prescription drugs, or dietary supplements you use. Also tell them if you smoke, drink alcohol, or use illegal drugs. Some items may interact with your medicine. What should I watch for while using this medicine? Tell your doctor or health care professional if your symptoms do not start to get better or if they get worse. You may get drowsy or dizzy. Do not drive, use machinery, or do anything that needs mental alertness until you know how this medicine affects you. Do not stand or sit up quickly, especially if you are an older patient. This reduces the risk of dizzy or fainting spells. Alcohol may interfere with the effect of this  medicine. Avoid alcoholic drinks. If you are taking another medicine that also causes drowsiness, you may have more side effects. Give your health care provider a list of all medicines you use. Your doctor will tell you how much medicine to take. Do not take more medicine than directed. Call emergency for help if you have problems breathing or unusual sleepiness. Your mouth may get dry. Chewing sugarless gum or sucking hard candy, and drinking plenty of water may help. Contact your doctor if the problem does not go away or is severe. What side effects may I notice from receiving this medicine? Side effects that you should report to your doctor or health care professional as soon as possible: -allergic reactions like skin rash, itching or hives, swelling of the face, lips, or tongue -breathing problems -hallucinations -signs and symptoms of liver injury like dark yellow or brown urine; general ill feeling or flu-like symptoms; light-colored stools; loss of appetite; nausea; right upper quadrant belly pain; unusually weak or tired; yellowing of the eyes or skin -signs and symptoms of low blood pressure like dizziness; feeling faint or lightheaded, falls; unusually weak or tired -unusually slow heartbeat -unusually weak or tired Side effects that usually do not require medical attention (report to your doctor or health care professional if they continue or are bothersome): -blurred vision -constipation -dizziness -dry mouth -tiredness This list may not describe all possible side effects. Call your doctor for medical advice about side effects. You may report side effects to FDA at 1-800-FDA-1088. Where should I keep my medicine? Keep out of the reach of children. Store at room temperature between 15 and 30 degrees C (59 and 86 degrees F). Throw away any unused medicine after the expiration date. NOTE: This sheet is a summary. It may not cover all possible information. If you have questions about  this medicine, talk to your doctor, pharmacist, or health care provider.  2018 Elsevier/Gold Standard (2015-04-27 13:52:12)

## 2018-05-30 NOTE — Progress Notes (Signed)
Pre visit review using our clinic review tool, if applicable. No additional management support is needed unless otherwise documented below in the visit note. 

## 2018-05-30 NOTE — Therapy (Signed)
[facility] [address], [place], [address] Phone: [phone]   Fax:  [phone]  Physical Therapy Treatment  Patient Details  Name: Carolyn Esparza MRN: [id number] Date of Birth: [birth year] Referring Provider (PT): Poggi, Smith Mince, MD   Encounter Date: 05/30/2018  PT End of Session - 05/30/18 1508    Visit Number  2    Number of Visits  17    Date for PT Re-Evaluation  07/23/18    Authorization Type  [employer]    Authorization Time Period  Current cert period: 89/21/1941 - 07/23/2018 (last PN: IE 05/28/2018)    Authorization - Visit Number  2    Authorization - Number of Visits  10    PT Start Time  1300    PT Stop Time  1345    PT Time Calculation (min)  45 min    Activity Tolerance  Patient tolerated treatment well;Patient limited by pain    Behavior During Therapy  [facility] for tasks assessed/performed       Past Medical History:  Diagnosis Date  . Anxiety   . ASCUS with positive high risk HPV cervical   . CAD (coronary artery disease)   . Chicken pox   . Chronic pain    neck, back, b/l hips   . Depression   . GERD (gastroesophageal reflux disease)   . History of Crohn's disease   . Hyperlipidemia   . Left ovarian cyst    s/p removal of 1 ovary ? which one removed per pt   . Libido, decreased   . Skin cancer    reports skin cancer removed from skin in 2016 or 2017  . Thyroid nodule     Past Surgical History:  Procedure Laterality Date  . ABDOMINAL HYSTERECTOMY    . APPENDECTOMY  2004  . BLADDER SURGERY    . BREAST BIOPSY     x 2  . COLONOSCOPY    . OOPHORECTOMY Right    Unilateral Right (per MRI 04/17/2018)  . WISDOM TOOTH EXTRACTION      There were no vitals filed for this visit.  Subjective Assessment - 05/30/18 1303    Subjective  Patient states she is feeling tired today after having difficulty sleeping second to pain. She states she felt okay after her last treatment here but  then hurt all day yesterday and  last night. She did not do her HEP yesterday due to having so much aching. She uses a pedometer and does 6-8K steps per day..     Pertinent History  Patient is a 52 y.o. female who presents to outpatient physical therapy with a referral for strain of right hip, partial glute med tear in right hip. This patient's chief complaints consist of pain, activity intolerance, concern about injuring it further, leading to the following functional deficits: difficulty with walking, standing, bending, lifting, climbing stairs, reaching feet, participating in valued activities such as gardening, hiking, traveling, exercise and physical activity.    Limitations  Standing;Walking;House hold activities    How long can you sit comfortably?  1.5 hours    How long can you stand comfortably?  <6  min    How long can you walk comfortably?  10-15 min    Diagnostic tests  MRI, Radiographs (shows partial right glute med tear, see chart for more details).     Patient Stated Goals  Not to hurt. To be able to not to be able to not  worry about how long she is standing, not to worry about her hip hurting while she is sleeping, for her neck not to hurt when she looks around.     Currently in Pain?  Yes    Pain Score  8     Pain Location  Hip    Pain Orientation  Right    Pain Descriptors / Indicators  Aching;Dull    Pain Type  Chronic pain    Pain Radiating Towards  cramp to right side low back and down right thigh only to touch.     Pain Onset  More than a month ago         [facility] PT Assessment - 05/30/18 0001      Observation/Other Assessments   Observations  See note from 05/28/2018 and 05/30/2018 for latest objective measurements.            PT Education - 05/30/18 1507    Education Details  form, purpose of exercise, HEP with handout.     Person(s) Educated  Patient    Methods  Explanation;Demonstration;Handout    Comprehension  Verbalized understanding;Returned demonstration       OBJECTIVE EXAM:  OBSERVATION/INSPECTION: Patient presents with significant left lumbar lateral shift. Leans away from left side more and more as time sitting goes on.  NEUROLOGICAL: Dermatomes: BLE WNL to light touch. Myotomes: BLE WNL.  SPINE MOTION Lumbar Spine AROM:   Flexion: = fingers completely on, 100% no pain.  Extension: = 25% pain.in back, less than last visit Side glide: R = 0% OP no pian, L = 75% OP no pain.  TREATMENT:   Therapeutic exercise: to centralize symptoms and improve ROM and strength required for successful completion of functional activities.  - Examination: ROM, sensory, and myotome assessment to guide today's intervention (see above)..  - Education on HEP including handout   - Between intervention ambulation and stair climbing to assess response to interventions.  - repeated lumbar extension in standing (REIS), increasing, worse.  - repeated lumbar extension in prone with hands elevated progressing to lock and sag (self overpressure), x 20 decreased pain brought on by REIS - repeated lumbar extension in prone with clinician overpressure at L5, x 5 no effect, decreased pain from REIS, better (no better than pre-testing)  - standing with right knee on plinth, repeated right hip extension in semiloaded standing x 10, no effect.  - standing side glide against wall (hips to left), x 10 with increased pain in hip and difficulty tolerating. Discontinued.    Manual therapy: to reduce pain and tissue tension, improve range of motion, neuromodulation, in order to promote improved ability to complete functional activities. - repeated lumbar extension in prone with clinician overpressure at L5, x 5 no effect, decreased pain from REIS, better (no better than pre-testing)  - Manual lateral shift correction (correcting left lateral shift), 2x10. Pt with difficulty tolerating. Reported increased right hip pain but location shifted more anterior and slightly closer  to midline. No change in mechanics. Pain subsided with rest. Allowed pt to minimize weightbearing on right LE due to being able to tolerate procedure better. No peripheralizing noted.   HOME EXERCISE PROGRAM Access Code: [email or web address]  URL: [email or web address]  Date: 05/30/2018  Prepared by: Rosita Kea   Exercises  Hooklying Isometric Clamshell - 10-15 reps - 5 second hold - 1 Sets - 1x daily - 7x weekly  Hooklying Isometric Hip Flexion - 10-15 reps - 5 second hold - 1 Sets - 1x daily -  7x weekly  Supine Hip Adduction Isometric with Ball - 10-15 reps - 5 second hold - 1 Sets - 1x daily - 7x weekly  Hooklying Gluteal Sets - 10-15 reps - 5 second hold - 1 Sets - 1x daily - 7x weekly  Supine Bilateral Hamstring Sets - 10-15 reps - 5 second hold - 1 Sets - 1x daily - 7x weekly  Prone Press Up - 10-15 reps - 1 second hold - 4x daily  Seated Correct Posture    Patient response to treatment:  Pt tolerated treatment fair. She did not demonstrate clear directional preference but her hip pain was affected by attempts at addressing lateral component of lumbar spine. Patient continued to report very high levels of pain when asked for a number but then said she felt good and had no pain when performing prone press ups and receiving CPAs. Patient's pain was not changed with repeated motions targeting hip. Back pain decreased with prone press up. Hip pain worse but not peripheralizing with lateral interventions and with weight bearing. Overall patient completed visit feeling no better or worse in the hip. Pt required cuing for proper technique and to facilitate improved neuromuscular control, strength, range of motion, and functional ability. She required reassurance to continue exercises. Prone press up added to HEP to address back component of pain.    PT Short Term Goals - 05/28/18 1809      PT SHORT TERM GOAL #1   Title  Be independent with home exercise program completed at least 3 times  per week for self-management of symptoms.    Baseline  initial HEP provided today    Time  2    Period  Weeks    Status  New    Target Date  06/11/18        PT Long Term Goals - 05/28/18 1809      PT LONG TERM GOAL #1   Title  Improve Lower Extremity Functional Scale score equal or greater than 50/80 to demonstrate improved self reported function.     Baseline  28/80 (05/28/2018);    Time  8    Period  Weeks    Target Date  06/05/18      PT LONG TERM GOAL #2   Title  Reduce pain with functional activities to equal or less than 3/10 to allow patient to complete usual activities including ADLs, IADLs, and social engagement with less difficulty.     Baseline  6/10 (05/28/2018);    Time  8    Period  Weeks    Status  New    Target Date  07/23/18      PT LONG TERM GOAL #4   Title  Pt will improve her neck FOTO score by at least 10 points as a demonstration of improved function.     Baseline  48 (05/28/2018);      PT LONG TERM GOAL #5   Title  Patient will be able to return to fitness program including walking for 30 min a day without limitation due to current condition.     Baseline  10-15 min walking comfortably (05/28/2018)    Time  8    Period  Weeks    Status  New    Target Date  07/23/18            Plan - 05/30/18 1510    Clinical Impression Statement  Pt tolerated treatment fair. She did not demonstrate clear directional preference but her hip pain  was affected by attempts at addressing lateral component of lumbar spine. Patient continued to report very high levels of pain when asked for a number but then said she felt good and had no pain when performing prone press ups and receiving CPAs. Patient's pain was not changed with repeated motions targeting hip. Back pain decreased with prone press up. Hip pain worse but not peripheralizing with lateral interventions and with weight bearing. Overall patient completed visit feeling no better or worse in the hip. Pt required  cuing for proper technique and to facilitate improved neuromuscular control, strength, range of motion, and functional ability. She required reassurance to continue exercises. Prone press up added to HEP to address back component of pain.     Rehab Potential  Good    Clinical Impairments Affecting Rehab Potential  (+) realistic expectations, motivation. (-) chronic nature of condition, multiple comorbidities that also affect function.     PT Frequency  2x / week    PT Duration  8 weeks    PT Treatment/Interventions  ADLs/Self Care Home Management;Aquatic Therapy;Biofeedback;Electrical Stimulation;Cryotherapy;Moist Heat;Gait training;Stair training;Functional mobility training;Therapeutic activities;Therapeutic exercise;Balance training;Neuromuscular re-education;Patient/family education;Manual techniques;Passive range of motion;Taping;Dry needling;Spinal Manipulations;Joint Manipulations;Other (comment)   joint mobilizations grades I-V   PT Next Visit Plan  continue to explore lateral and extension components of low back derived symptoms, continue to progress hip exercises as tolerated.     PT Home Exercise Plan  Medbridge Access Code: [email or web address]     Consulted and Agree with Plan of Care  Patient       Patient will benefit from skilled therapeutic intervention in order to improve the following deficits and impairments:  Improper body mechanics, Pain, Cardiopulmonary status limiting activity, Decreased mobility, Hypermobility, Increased muscle spasms, Postural dysfunction, Decreased activity tolerance, Decreased endurance, Decreased strength, Impaired perceived functional ability, Difficulty walking, Obesity  Visit Diagnosis: Pain in right hip  Chronic right-sided low back pain, unspecified whether sciatica present  Muscle weakness (generalized)  Cervicalgia  Pain in left hip     Problem List Patient Active Problem List   Diagnosis Date Noted  . Abnormal MRI, cervical spine 03/28/2018   . Menopause 11/26/2017  . Right upper quadrant pain 11/26/2017  . Abnormal Pap smear of cervix 07/27/2017  . Vasomotor flushing 07/19/2017  . Depression 07/18/2017  . Nausea 06/25/2017  . Hip pain, right 06/25/2017  . OSA (obstructive sleep apnea) 04/18/2017  . Annual physical exam 04/10/2017  . Fatigue 12/16/2016  . Chronic back pain 05/30/2016  . Abdominal pain 03/12/2016  . Anxiety and depression 12/28/2015  . History of obstructive sleep apnea 12/15/2015  . Thyroid nodule 12/15/2015  . GERD (gastroesophageal reflux disease) 12/15/2015  . Hyperlipidemia 12/15/2015  . Pain medication agreement signed 03/25/2015  . CAD in native artery 02/05/2015  . Right supraspinatus tenosynovitis 08/17/2014  . Crohn's disease involving terminal ileum ([place]) 03/30/2014  . FH: colon polyps 03/30/2014  . 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 Other bursitis disorders 07/12/2012  . Pain in joint, pelvic region and thigh 07/12/2012  . Crohn's disease ([place]) 01/23/2012    Nancy Nordmann, PT, DPT 05/30/2018, 3:24 PM  [facility] [address] [place], [address] Phone: [phone]   Fax:  [phone]  Name: Carolyn Esparza MRN: [id number] Date of Birth: 20-Jan-1966

## 2018-05-30 NOTE — Progress Notes (Signed)
Chief Complaint  Patient presents with  . Follow-up   F/u  1. C/o neck and right hip pain right hip injection 05/10/18 Dr. Roland Rack due f/u 06/2018. She does not want hip surgery as he rec currently afraid of anesthesia  2. Still c/o interrupted sleep and due for repeat sleep study will refer to pulmonary to set up sleep study  3. Obesity weight is same but she feels like topamax 25 mg qhs is helping with wt loss and adipex taking x 2 months did not help as much agreeable to continue x 2 more months  4. H/o chronic back pain f/u Dr. Electa Sniff she does not want surgery as he rec  5. Depression zoloft is helping   Review of Systems  Constitutional: Negative for weight loss.  HENT: Negative for hearing loss.   Eyes: Negative for blurred vision.  Respiratory: Negative for shortness of breath.   Cardiovascular: Negative for chest pain.  Musculoskeletal: Positive for back pain, joint pain and neck pain.  Skin: Negative for rash.  Neurological: Negative for headaches.  Psychiatric/Behavioral: Negative for depression.   Past Medical History:  Diagnosis Date  . Anxiety   . ASCUS with positive high risk HPV cervical   . CAD (coronary artery disease)   . Chicken pox   . Chronic pain    neck, back, b/l hips   . Depression   . GERD (gastroesophageal reflux disease)   . History of Crohn's disease   . Hyperlipidemia   . Left ovarian cyst    s/p removal of 1 ovary ? which one removed per pt   . Libido, decreased   . Skin cancer    reports skin cancer removed from skin in 2016 or 2017  . Thyroid nodule    Past Surgical History:  Procedure Laterality Date  . ABDOMINAL HYSTERECTOMY    . APPENDECTOMY  2004  . BLADDER SURGERY    . BREAST BIOPSY     x 2  . COLONOSCOPY    . OOPHORECTOMY Right    Unilateral Right (per MRI 04/17/2018)  . WISDOM TOOTH EXTRACTION     Family History  Problem Relation Age of Onset  . Alcohol abuse Mother   . Hyperlipidemia Mother   . Heart disease Mother   .  Hypertension Mother   . Diabetes Mother   . Crohn's disease Sister        1/2 sister  . Depression Sister   . Leukemia Paternal Grandmother   . Cancer Paternal Aunt        blood, type unknown   Social History   Socioeconomic History  . Marital status: Married    Spouse name: douglas  . Number of children: 0  . Years of education: Not on file  . Highest education level: Associate degree: occupational, Hotel manager, or vocational program  Occupational History  . Occupation: Public relations account executive    Comment: full time  Social Needs  . Financial resource strain: Not hard at all  . Food insecurity:    Worry: Never true    Inability: Never true  . Transportation needs:    Medical: No    Non-medical: No  Tobacco Use  . Smoking status: Former Smoker    Types: Cigarettes    Last attempt to quit: 08/29/2000    Years since quitting: 17.7  . Smokeless tobacco: Never Used  Substance and Sexual Activity  . Alcohol use: No    Alcohol/week: 0.0 standard drinks    Comment: social   .  Drug use: No  . Sexual activity: Not Currently  Lifestyle  . Physical activity:    Days per week: 2 days    Minutes per session: 20 min  . Stress: Only a little  Relationships  . Social connections:    Talks on phone: Never    Gets together: Never    Attends religious service: Never    Active member of club or organization: No    Attends meetings of clubs or organizations: Never    Relationship status: Married  . Intimate partner violence:    Fear of current or ex partner: No    Emotionally abused: No    Physically abused: No    Forced sexual activity: No  Other Topics Concern  . Not on file  Social History Narrative   Married    Works [employer]    Current Meds  Medication Sig  . aluminum-magnesium hydroxide-simethicone (MAALOX) [phone] MG/5ML SUSP Take 30 mLs by mouth 4 (four) times daily -  before meals and at bedtime.  . Ascorbic Acid (VITAMIN C) 1000 MG tablet Take 1,000 mg by mouth daily.    Marland Kitchen aspirin EC 81 MG tablet Take 81 mg by mouth daily.  Marland Kitchen atorvastatin (LIPITOR) 10 MG tablet Take 1 tablet (10 mg total) by mouth daily. At night  . buPROPion (WELLBUTRIN XL) 300 MG 24 hr tablet Take 1 tablet (300 mg total) by mouth daily.  Marland Kitchen docusate sodium (DOK) 100 MG capsule TAKE 1 CAPSULE(100 MG) BY MOUTH TWICE DAILY  . estradiol (CLIMARA - DOSED IN MG/24 HR) 0.05 mg/24hr patch Place 1 patch (0.05 mg total) onto the skin once a week.  . famotidine (PEPCID) 20 MG tablet Take 1 tablet (20 mg total) by mouth 2 (two) times daily.  . fentaNYL (DURAGESIC - DOSED MCG/HR) 25 MCG/HR patch Place 25 mcg onto the skin every 3 (three) days.   Marland Kitchen HYDROcodone-acetaminophen (NORCO) 10-325 MG tablet Take 1 tablet by mouth every 6 (six) hours as needed.   . lidocaine (LIDODERM) 5 % Place 2 patches onto the skin daily.   . Multiple Vitamin (MULTI-VITAMINS) TABS Take 1 tablet by mouth daily.   . mupirocin nasal ointment (BACTROBAN NASAL) 2 % Bid nose right x 5 days  . Omega-3 Fatty Acids (FISH OIL PO) Take 1 capsule by mouth daily.   Marland Kitchen omeprazole (PRILOSEC) 20 MG capsule Take 1 capsule (20 mg total) by mouth daily. In am before food 30 minutes  . ondansetron (ZOFRAN ODT) 4 MG disintegrating tablet Take 1 tablet (4 mg total) by mouth every 8 (eight) hours as needed for nausea or vomiting.  . phentermine (ADIPEX-P) 37.5 MG tablet Take 1 tablet (37.5 mg total) by mouth daily before breakfast.  . promethazine (PHENERGAN) 25 MG tablet TAKE 1 TABLET(25 MG) BY MOUTH EVERY 8 HOURS AS NEEDED FOR NAUSEA OR VOMITING  . sertraline (ZOLOFT) 50 MG tablet Take 1 tablet (50 mg total) by mouth daily with breakfast.  . Topiramate (TOPAMAX PO) Take by mouth once.  . traZODone (DESYREL) 50 MG tablet Take 0.5-1 tablets (25-50 mg total) by mouth at bedtime as needed for sleep.  . Turmeric Curcumin 500 MG CAPS Take by mouth.  . valACYclovir (VALTREX) 1000 MG tablet   . [DISCONTINUED] celecoxib (CELEBREX) 100 MG capsule Take 1  capsule (100 mg total) by mouth 2 (two) times daily. (Patient taking differently: Take 200 mg by mouth daily. )   Allergies  Allergen Reactions  . Propofol Palpitations    BP  drops and EKG changes, bladder retention  . Nsaids Other (See Comments)    Flare of Crohn's.  . Pentasa [Mesalamine] Other (See Comments)    Chest pain.   No results found for this or any previous visit (from the past 2160 hour(s)). Objective  Body mass index is 30.96 kg/m. Wt Readings from Last 3 Encounters:  05/30/18 174 lb 12.8 oz (79.3 kg)  03/28/18 179 lb 6.4 oz (81.4 kg)  11/26/17 174 lb 6 oz (79.1 kg)   Temp Readings from Last 3 Encounters:  05/30/18 98.3 F (36.8 C) (Oral)  03/28/18 98.1 F (36.7 C) (Oral)  11/26/17 (!) 97.5 F (36.4 C) (Oral)   BP Readings from Last 3 Encounters:  05/30/18 128/86  03/28/18 118/80  11/26/17 118/70   Pulse Readings from Last 3 Encounters:  05/30/18 78  03/28/18 66  11/26/17 71    Physical Exam  Constitutional: She is oriented to person, place, and time. Vital signs are normal. She appears well-developed and well-nourished. She is cooperative.  HENT:  Head: Normocephalic and atraumatic.  Mouth/Throat: Oropharynx is clear and moist and mucous membranes are normal.  Eyes: Pupils are equal, round, and reactive to light. Conjunctivae are normal.  Cardiovascular: Normal rate, regular rhythm and normal heart sounds.  Pulmonary/Chest: Effort normal and breath sounds normal.  Neurological: She is alert and oriented to person, place, and time. Gait normal.  Skin: Skin is warm, dry and intact.  Psychiatric: She has a normal mood and affect. Her speech is normal and behavior is normal. Judgment and thought content normal. Cognition and memory are normal.  Nursing note and vitals reviewed.   Assessment   1. Chronic pain neck, hips and back  2. H/o OSA on cpap interrupted sleep  3. Obesity  4. Depression improved  5. HM Plan   1.f/u Dr. Roland Rack, [place] and  pain clinic  See med changes now on topamax 25 qhs  celebrex 200 mg qd is helping add zanaflex 4 mg qhs  Due to f/u with Dr Roland Rack 06/2018 right hip injection not helpful  2. Refer to pulm to set up sleep study  3. 2 more months adipex total 4  4. zoloft hleping  5. HM Will get flu later hep B 3/3 vaccines today shingrix had 2/2  utd Tdap   Sp partial hysterectomy. H/o Pap ASCUS 07/19/17 f/u with Dr. Kenton Kingfisher he wants to f/u in 1 year  Colonoscopy pt wants to hold as disc prev visits will keep encouraging given h/o Crohns per hx, FH polpys  Mammogram 06/26/17 neg refer today GI breast center [facility] saw recently Dr. Kellie Moor 07/2018 BMI >31 will do adipex 37.5 x 2 months and f/u in 2 months lifestyle changes   Work labs 01/24/18 TC 163, HDL 40, LDL 86, TG 185, glucose 68, BP 124/87 BMI 31.30 waist 30 A1C 5.4%  Saw pain clinic 05/10/18 f/u in 3 months lumbar RFA right then left, consult ortho, topamax 25 mg bid referral wt loss clinic, fentanyl 25 mcg x 3 months percocet 10-325 mg qd prn celebrex 200 mg qd,   Dr. Roland Rack saw 05/10/18 rec PT steroid inj and f/u in 2 months  Provider: Dr. Olivia Mackie [name]-Internal Medicine

## 2018-06-03 ENCOUNTER — Encounter: Payer: Self-pay | Admitting: Physical Therapy

## 2018-06-03 ENCOUNTER — Ambulatory Visit: Payer: Managed Care, Other (non HMO) | Attending: Surgery | Admitting: Physical Therapy

## 2018-06-03 DIAGNOSIS — M545 Low back pain, unspecified: Secondary | ICD-10-CM

## 2018-06-03 DIAGNOSIS — M6281 Muscle weakness (generalized): Secondary | ICD-10-CM | POA: Diagnosis present

## 2018-06-03 DIAGNOSIS — M542 Cervicalgia: Secondary | ICD-10-CM | POA: Insufficient documentation

## 2018-06-03 DIAGNOSIS — M25551 Pain in right hip: Secondary | ICD-10-CM | POA: Diagnosis not present

## 2018-06-03 DIAGNOSIS — G8929 Other chronic pain: Secondary | ICD-10-CM | POA: Diagnosis present

## 2018-06-03 DIAGNOSIS — M25552 Pain in left hip: Secondary | ICD-10-CM | POA: Insufficient documentation

## 2018-06-03 NOTE — Therapy (Signed)
[facility] [address] [place], [address] Phone: [phone]   Fax:  [phone]  Physical Therapy Treatment  Patient Details  Name: Carolyn Esparza MRN: [id number] Date of Birth: [birth year] Referring Provider (PT): Poggi, Smith Mince, MD   Encounter Date: 06/03/2018  PT End of Session - 06/03/18 1631    Visit Number  3    Number of Visits  17    Date for PT Re-Evaluation  07/23/18    Authorization Type  [employer]    Authorization Time Period  Current cert period: 14/97/0263 - 07/23/2018 (last PN: IE 05/28/2018)    Authorization - Visit Number  3    Authorization - Number of Visits  10    PT Start Time  1615    PT Stop Time  1705    PT Time Calculation (min)  50 min    Activity Tolerance  Patient tolerated treatment well;Patient limited by pain    Behavior During Therapy  [facility] for tasks assessed/performed       Past Medical History:  Diagnosis Date  . Anxiety   . ASCUS with positive high risk HPV cervical   . CAD (coronary artery disease)   . Chicken pox   . Chronic pain    neck, back, b/l hips   . Depression   . GERD (gastroesophageal reflux disease)   . History of Crohn's disease   . Hyperlipidemia   . Left ovarian cyst    s/p removal of 1 ovary ? which one removed per pt   . Libido, decreased   . Skin cancer    reports skin cancer removed from skin in 2016 or 2017  . Thyroid nodule     Past Surgical History:  Procedure Laterality Date  . ABDOMINAL HYSTERECTOMY    . APPENDECTOMY  2004  . BLADDER SURGERY    . BREAST BIOPSY     x 2  . COLONOSCOPY    . OOPHORECTOMY Right    Unilateral Right (per MRI 04/17/2018)  . WISDOM TOOTH EXTRACTION      There were no vitals filed for this visit.  Subjective Assessment - 06/03/18 1619    Subjective  Patient states her pain is 5/10 today in her right hip and 6/10 in the right back. Patient reports her low back was a little sore after last visit but  nothing unbearable. Completing HEP fairly often, logged in to pt portal and recorded when she did it.  Filled out Baseline Activity Tolerance tool. Reported higher stress when had higher pain.  Generally prolonged standing and walking activities bothered her more and resting and sleeping activities were better.      Pertinent History  Patient is a 52 y.o. female who presents to outpatient physical therapy with a referral for strain of right hip, partial glute med tear in right hip. This patient's chief complaints consist of pain, activity intolerance, concern about injuring it further, leading to the following functional deficits: difficulty with walking, standing, bending, lifting, climbing stairs, reaching feet, participating in valued activities such as gardening, hiking, traveling, exercise and physical activity.    Limitations  Standing;Walking;House hold activities    How long can you sit comfortably?  1.5 hours    How long can you stand comfortably?  <6  min    How long can you walk comfortably?  10-15 min    Diagnostic tests  MRI, Radiographs (shows partial right glute med tear, see chart for more  details).     Patient Stated Goals  Not to hurt. To be able to not to be able to not worry about how long she is standing, not to worry about her hip hurting while she is sleeping, for her neck not to hurt when she looks around.     Currently in Pain?  Yes    Pain Score  5     Pain Location  Hip    Pain Orientation  Right    Pain Descriptors / Indicators  Aching;Dull    Pain Type  Chronic pain    Pain Radiating Towards  cramp to right side low bac kand donw right thigh to touch.     Pain Onset  More than a month ago    Pain Frequency  Constant    Multiple Pain Sites  Yes    Pain Score  6    Pain Location  Back    Pain Orientation  Right;Lower    Pain Descriptors / Indicators  Aching    Pain Type  Chronic pain    Pain Onset  More than a month ago    Pain Frequency  Constant    Aggravating  Factors   stanidng, lifting, any lengthy walking    Pain Relieving Factors  hot bath, sitting down    Effect of Pain on Daily Activities  restricts         PT Education - 06/03/18 1631    Education provided  Yes    Education Details  form, purpuose of exercise    Person(s) Educated  Patient    Methods  Explanation;Demonstration    Comprehension  Verbalized understanding;Returned demonstration       Denies any history of lumbar surgery Denies latex allergy  TREATMENT:  Therapeutic exercise:to centralize symptoms and improve ROM and strength required for successful completion of functional activities.  - NuStep level 3 using bilateral upper and lower extremities. Seat setting 8. For improved extremity mobility, muscular endurance, and activity tolerance; and to induce the analgesic effect of aerobic exercise, stimulate improved joint nutrition, and prepare body structures and systems for following interventions. x 5  Minutes during subjective exam.  - prone hip extension with knees flexed to 90 degrees to bias glute max, 2x10 each side.  - prone bilateral hip IR with 6inch diameter ball between knees that are flexed to 90 degrees with yellow theraband loop around ankles. 2x10. - prone hip ER against yellow theraband loop held by clinician looped around ankle with knee flexed to 90 degrees, 2x10 each side.  - prone hamstring curls with 5# ankle weight, 2x10 on each side.  - supine alternating hip flexion with yellow theraloop around feet, 2x10 each side.  - supine bridge with wide feet, yellow theraloop around knees and active abduction against band with each rep. x 6   Manual therapy: to reduce pain and tissue tension, improve range of motion, neuromodulation, in order to promote improved ability to complete functional activities. - CPA and left UPA to lower lumbar segments grade II-IV with no effect. - Palpation of left glute region to assess response (TTP).   HOME EXERCISE  PROGRAM Access Code: [email or web address]  URL: [email or web address]  Date: 05/30/2018  Prepared by: Rosita Kea   Exercises   Hooklying Isometric Clamshell - 10-15 reps - 5 second hold - 1 Sets - 1x daily - 7x weekly   Hooklying Isometric Hip Flexion - 10-15 reps - 5 second hold - 1 Sets - 1x daily - 7x  weekly   Supine Hip Adduction Isometric with Ball - 10-15 reps - 5 second hold - 1 Sets - 1x daily - 7x weekly   Hooklying Gluteal Sets - 10-15 reps - 5 second hold - 1 Sets - 1x daily - 7x weekly   Supine Bilateral Hamstring Sets - 10-15 reps - 5 second hold - 1 Sets - 1x daily - 7x weekly   Prone Press Up - 10-15 reps - 1 second hold - 4x daily   Seated Correct Posture    Patient response to treatment:  Pt toleratedtreatment well. She was able to complete all exercises with minimal to no lasting pain or discomfort except felt some pain with hip abduction during the bridge.  New exercises were not added to her HEP until it is determined if she has tolerated them well a few hours following.  Pt required cuing for proper technique and to facilitate improved neuromuscular control, strength, range of motion, and functional ability. She is making progress towards her goals at this point as she was able to tolerate increased intensity without increased pain at this time.    PT Short Term Goals - 05/28/18 1809      PT SHORT TERM GOAL #1   Title  Be independent with home exercise program completed at least 3 times per week for self-management of symptoms.    Baseline  initial HEP provided today    Time  2    Period  Weeks    Status  New    Target Date  06/11/18        PT Long Term Goals - 05/28/18 1809      PT LONG TERM GOAL #1   Title  Improve Lower Extremity Functional Scale score equal or greater than 50/80 to demonstrate improved self reported function.     Baseline  28/80 (05/28/2018);    Time  8    Period  Weeks    Target Date  06/05/18      PT LONG TERM GOAL #2    Title  Reduce pain with functional activities to equal or less than 3/10 to allow patient to complete usual activities including ADLs, IADLs, and social engagement with less difficulty.     Baseline  6/10 (05/28/2018);    Time  8    Period  Weeks    Status  New    Target Date  07/23/18      PT LONG TERM GOAL #4   Title  Pt will improve her neck FOTO score by at least 10 points as a demonstration of improved function.     Baseline  48 (05/28/2018);      PT LONG TERM GOAL #5   Title  Patient will be able to return to fitness program including walking for 30 min a day without limitation due to current condition.     Baseline  10-15 min walking comfortably (05/28/2018)    Time  8    Period  Weeks    Status  New    Target Date  07/23/18            Plan - 06/03/18 1903    Clinical Impression Statement  Pt toleratedtreatment well. She was able to complete all exercises with minimal to no lasting pain or discomfort except felt some pain with hip abduction during the bridge.  New exercises were not added to her HEP until it is determined if she has tolerated them well a few hours following.  Pt  required cuing for proper technique and to facilitate improved neuromuscular control, strength, range of motion, and functional ability. She is making progress towards her goals at this point as she was able to tolerate increased intensity without increased pain at this time.     Rehab Potential  Good    Clinical Impairments Affecting Rehab Potential  (+) realistic expectations, motivation. (-) chronic nature of condition, multiple comorbidities that also affect function.     PT Frequency  2x / week    PT Duration  8 weeks    PT Treatment/Interventions  ADLs/Self Care Home Management;Aquatic Therapy;Biofeedback;Electrical Stimulation;Cryotherapy;Moist Heat;Gait training;Stair training;Functional mobility training;Therapeutic activities;Therapeutic exercise;Balance training;Neuromuscular  re-education;Patient/family education;Manual techniques;Passive range of motion;Taping;Dry needling;Spinal Manipulations;Joint Manipulations;Other (comment)   joint mobilizations grades I-V   PT Next Visit Plan  continue graded functional, LE, and trunk exercises as tolerated.     PT Home Exercise Plan  Medbridge Access Code: [email or web address]     Consulted and Agree with Plan of Care  Patient       Patient will benefit from skilled therapeutic intervention in order to improve the following deficits and impairments:  Improper body mechanics, Pain, Cardiopulmonary status limiting activity, Decreased mobility, Hypermobility, Increased muscle spasms, Postural dysfunction, Decreased activity tolerance, Decreased endurance, Decreased strength, Impaired perceived functional ability, Difficulty walking, Obesity  Visit Diagnosis: Pain in right hip  Chronic right-sided low back pain, unspecified whether sciatica present  Muscle weakness (generalized)  Cervicalgia  Pain in left hip     Problem List Patient Active Problem List   Diagnosis Date Noted  . Abnormal MRI, cervical spine 03/28/2018  . Menopause 11/26/2017  . Right upper quadrant pain 11/26/2017  . Abnormal Pap smear of cervix 07/27/2017  . Vasomotor flushing 07/19/2017  . Depression 07/18/2017  . Nausea 06/25/2017  . Hip pain, right 06/25/2017  . OSA (obstructive sleep apnea) 04/18/2017  . Annual physical exam 04/10/2017  . Fatigue 12/16/2016  . Chronic back pain 05/30/2016  . Abdominal pain 03/12/2016  . Anxiety and depression 12/28/2015  . History of obstructive sleep apnea 12/15/2015  . Thyroid nodule 12/15/2015  . GERD (gastroesophageal reflux disease) 12/15/2015  . Hyperlipidemia 12/15/2015  . Pain medication agreement signed 03/25/2015  . CAD in native artery 02/05/2015  . Right supraspinatus tenosynovitis 08/17/2014  . Crohn's disease involving terminal ileum ([place]) 03/30/2014  . FH: colon polyps 03/30/2014  . 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 Other bursitis disorders 07/12/2012  . Pain in joint, pelvic region and thigh 07/12/2012  . Crohn's disease ([place]) 01/23/2012    Nancy Nordmann, PT, DPT 06/03/2018, 7:03 PM  [facility] [address] [place], [address] Phone: [phone]   Fax:  [phone]  Name: Joelynn Dust MRN: [id number] Date of Birth: Dec 06, 1965

## 2018-06-04 ENCOUNTER — Ambulatory Visit (INDEPENDENT_AMBULATORY_CARE_PROVIDER_SITE_OTHER): Payer: Managed Care, Other (non HMO) | Admitting: Internal Medicine

## 2018-06-04 ENCOUNTER — Encounter: Payer: Self-pay | Admitting: Internal Medicine

## 2018-06-04 DIAGNOSIS — G4710 Hypersomnia, unspecified: Secondary | ICD-10-CM

## 2018-06-04 DIAGNOSIS — G4733 Obstructive sleep apnea (adult) (pediatric): Secondary | ICD-10-CM | POA: Diagnosis not present

## 2018-06-04 NOTE — Addendum Note (Signed)
Addended by: Laverle Hobby on: 06/04/2018 02:22 PM   Modules accepted: Orders

## 2018-06-04 NOTE — Progress Notes (Addendum)
[facility] Consultation      Assessment and Plan:  Obstructive sleep apnea. - Uncertain as to how well her sleep apnea is treated at this time.  Will need to get a download from her device to see if she has residual apneas, and to confirm use.   Excessive daytime sleepiness. - Possibilities include obstructive sleep apnea with residual sleepiness, narcolepsy/hypersomnia, as well as excessive sleepiness due to sedative medications. - As above will see results from download, will plan for either repeat sleep study or sleep study with M SLT depending on results.  Chronic pain. - If all testing negative, patient does not respond to usual therapies, will need to refer back to pain management to see if sedative pain medications can be weaned down.  Return in about 3 months (around 09/04/2018).  Addendum. **Download data 03/07/2018-06/04/2018>> raw data personally reviewed.  Usage greater than 4 hours is 88/90 days.  Pressure range is auto CPAP 5-12.  Median pressure 7, 95th percentile pressure 10, maximum pressure 11.  Leaks minimal.  Residual AHI is 1.2.  Overall this shows very good compliance with CPAP with excellent control of obstructive sleep apnea.  Given good control of OSA, will need to send for CPAP titration study followed by MSLT.  -DR.    Date: 06/04/2018  MRN# [id number] Carolyn Esparza [birth year]   Carolyn Esparza is a 52 y.o. old female seen in consultation for chief complaint of:    Chief Complaint  Patient presents with  . Consult    Referred by Dr. Verdia Kuba for eval of OSA:  . Sleep Apnea    pt on cpap last sleep study 2014 [facility]  . Fatigue    pt excessively tired during the day.    HPI:   The patient is a 52 year old female, previously diagnosed with mild obstructive sleep apnea in 2014. She presents today with symptoms of excessive daytime sleepiness, her Epworth score is elevated at 21. She feels that she is tired all the time, she can take  a nap at any time. She wears the cpap every night, not sometimes takes it off early in the morning when her dog wakes her. This happens about half the nights, but even on those nights she gets it about 6-7 hours with it.  She wakes up in the middle of the night.  These symptoms have been going on for about 6-8 months, and before that she was ok. She recalls no new medications around that time.   Denies sleep paralysis, no sleep walking, no cataplexy.  Goes to bed 9 pm, wakes at 5 am.   Review of her medication list includes fentanyl patch 25 mcg, Norco once daily, Adipex daily, Zoloft daily, Zanaflex about once or twice per week, topiramate every night.    Review/summary of outside records: **CPAP titration study 10/22/2012>> CPAP was titrated to 12 with a residual AHI of 1.8. **Baseline sleep study 09/25/2012 mild obstructive sleep apnea with AHI of 8, REM supine AHI was 42.  Recommended CPAP titration.  PMHX:   Past Medical History:  Diagnosis Date  . Anxiety   . ASCUS with positive high risk HPV cervical   . CAD (coronary artery disease)   . Chicken pox   . Chronic pain    neck, back, b/l hips   . Depression   . GERD (gastroesophageal reflux disease)   . History of Crohn's disease   . Hyperlipidemia   . Left ovarian cyst    s/p  removal of 1 ovary ? which one removed per pt   . Libido, decreased   . Skin cancer    reports skin cancer removed from skin in 2016 or 2017  . Thyroid nodule    Surgical Hx:  Past Surgical History:  Procedure Laterality Date  . ABDOMINAL HYSTERECTOMY    . APPENDECTOMY  2004  . BLADDER SURGERY    . BREAST BIOPSY     x 2  . COLONOSCOPY    . OOPHORECTOMY Right    Unilateral Right (per MRI 04/17/2018)  . WISDOM TOOTH EXTRACTION     Family Hx:  Family History  Problem Relation Age of Onset  . Alcohol abuse Mother   . Hyperlipidemia Mother   . Heart disease Mother   . Hypertension Mother   . Diabetes Mother   . Crohn's disease Sister         1/2 sister  . Depression Sister   . Leukemia Paternal Grandmother   . Cancer Paternal Aunt        blood, type unknown   Social Hx:   Social History   Tobacco Use  . Smoking status: Former Smoker    Types: Cigarettes    Last attempt to quit: 08/29/2000    Years since quitting: 17.7  . Smokeless tobacco: Never Used  Substance Use Topics  . Alcohol use: No    Alcohol/week: 0.0 standard drinks    Comment: social   . Drug use: No   Medication:    Current Outpatient Medications:  .  Ascorbic Acid (VITAMIN C) 1000 MG tablet, Take 1,000 mg by mouth daily. , Disp: , Rfl:  .  aspirin EC 81 MG tablet, Take 81 mg by mouth daily., Disp: , Rfl:  .  atorvastatin (LIPITOR) 10 MG tablet, Take 1 tablet (10 mg total) by mouth daily. At night, Disp: 90 tablet, Rfl: 3 .  buPROPion (WELLBUTRIN XL) 300 MG 24 hr tablet, Take 1 tablet (300 mg total) by mouth daily., Disp: 90 tablet, Rfl: 3 .  celecoxib (CELEBREX) 200 MG capsule, Take 1 capsule (200 mg total) by mouth daily., Disp: 90 capsule, Rfl: 3 .  docusate sodium (DOK) 100 MG capsule, TAKE 1 CAPSULE(100 MG) BY MOUTH TWICE DAILY, Disp: , Rfl:  .  estradiol (CLIMARA - DOSED IN MG/24 HR) 0.05 mg/24hr patch, Place 1 patch (0.05 mg total) onto the skin once a week., Disp: 4 patch, Rfl: 11 .  fentaNYL (DURAGESIC - DOSED MCG/HR) 25 MCG/HR patch, Place 25 mcg onto the skin every 3 (three) days. , Disp: , Rfl:  .  HYDROcodone-acetaminophen (NORCO) 10-325 MG tablet, Take 1 tablet by mouth every 6 (six) hours as needed. , Disp: , Rfl:  .  lidocaine (LIDODERM) 5 %, Place 2 patches onto the skin daily. , Disp: , Rfl:  .  Multiple Vitamin (MULTI-VITAMINS) TABS, Take 1 tablet by mouth daily. , Disp: , Rfl:  .  Omega-3 Fatty Acids (FISH OIL PO), Take 1 capsule by mouth daily. , Disp: , Rfl:  .  omeprazole (PRILOSEC) 20 MG capsule, Take 1 capsule (20 mg total) by mouth daily. In am before food 30 minutes, Disp: 90 capsule, Rfl: 3 .  phentermine (ADIPEX-P) 37.5 MG  tablet, Take 1 tablet (37.5 mg total) by mouth daily before breakfast., Disp: 60 tablet, Rfl: 0 .  sertraline (ZOLOFT) 50 MG tablet, Take 1 tablet (50 mg total) by mouth daily with breakfast., Disp: 90 tablet, Rfl: 3 .  tiZANidine (ZANAFLEX) 4 MG capsule,  Take 1 capsule (4 mg total) by mouth at bedtime as needed for muscle spasms., Disp: 30 capsule, Rfl: 0 .  Topiramate (TOPAMAX PO), Take 25 mg by mouth at bedtime. , Disp: , Rfl:  .  valACYclovir (VALTREX) 1000 MG tablet, , Disp: , Rfl: 0   Allergies:  Propofol; Nsaids; and Pentasa [mesalamine]  Review of Systems: Gen:  Denies  fever, sweats, chills HEENT: Denies blurred vision, double vision. bleeds, sore throat Cvc:  No dizziness, chest pain. Resp:   Denies cough or sputum production, shortness of breath Gi: Denies swallowing difficulty, stomach pain. Gu:  Denies bladder incontinence, burning urine Ext:   No Joint pain, stiffness. Skin: No skin rash,  hives  Endoc:  No polyuria, polydipsia. Psych: No depression, insomnia. Other:  All other systems were reviewed with the patient and were negative other that what is mentioned in the HPI.   Physical Examination:   VS: BP 100/70 (BP Location: Left Arm, Cuff Size: Normal)   Pulse 80   Resp 16   Ht 5\' 3"  (1.6 m)   Wt 172 lb (78 kg)   SpO2 95%   BMI 30.47 kg/m   General Appearance: No distress  Neuro:without focal findings,  speech normal,  HEENT: PERRLA, EOM intact.   Pulmonary: normal breath sounds, No wheezing.  CardiovascularNormal S1,S2.  No m/r/g.   Abdomen: Benign, Soft, non-tender. Renal:  No costovertebral tenderness  GU:  No performed at this time. Endoc: No evident thyromegaly, no signs of acromegaly. Skin:   warm, no rashes, no ecchymosis  Extremities: normal, no cyanosis, clubbing.  Other findings:    LABORATORY PANEL:   CBC No results for input(s): WBC, HGB, HCT, PLT in the last 168  hours. ------------------------------------------------------------------------------------------------------------------  Chemistries  No results for input(s): NA, K, CL, CO2, GLUCOSE, BUN, CREATININE, CALCIUM, MG, AST, ALT, ALKPHOS, BILITOT in the last 168 hours.  Invalid input(s): GFRCGP ------------------------------------------------------------------------------------------------------------------  Cardiac Enzymes No results for input(s): TROPONINI in the last 168 hours. ------------------------------------------------------------  RADIOLOGY:  No results found.     Thank  you for the consultation and for allowing [facility] to assist in the care of your patient. Our recommendations are noted above.  Please contact us if we can be of further service.   Marda Stalker, M.D., F.C.C.P.  Board Certified in Internal Medicine, Pulmonary Medicine, [place], and Sleep Medicine.  [facility] Office Number: [phone]   06/04/2018

## 2018-06-04 NOTE — Patient Instructions (Signed)
Please bring in your CPAP SD card for a download.  Once we have looked at this week and determine whether you need a new sleep study.

## 2018-06-05 ENCOUNTER — Ambulatory Visit: Payer: Managed Care, Other (non HMO) | Admitting: Physical Therapy

## 2018-06-06 ENCOUNTER — Telehealth: Payer: Self-pay | Admitting: *Deleted

## 2018-06-06 ENCOUNTER — Ambulatory Visit: Payer: Managed Care, Other (non HMO) | Admitting: Physical Therapy

## 2018-06-06 NOTE — Telephone Encounter (Signed)
-----   Message from Laverle Hobby, MD sent at 06/04/2018  2:22 PM EST ----- Regarding: Will need CPAP titration and MSLT.  Pt will need CPAP titration followed immediately by MSLT. Follow up 2 weeks later for results.

## 2018-06-06 NOTE — Telephone Encounter (Signed)
cpap titration and MSLT ordered.

## 2018-06-06 NOTE — Telephone Encounter (Signed)
Patient aware to call back to schedule 2 week f/u.

## 2018-06-10 ENCOUNTER — Encounter: Payer: Self-pay | Admitting: Physical Therapy

## 2018-06-10 ENCOUNTER — Ambulatory Visit: Payer: Managed Care, Other (non HMO) | Admitting: Physical Therapy

## 2018-06-10 DIAGNOSIS — M542 Cervicalgia: Secondary | ICD-10-CM

## 2018-06-10 DIAGNOSIS — M545 Low back pain, unspecified: Secondary | ICD-10-CM

## 2018-06-10 DIAGNOSIS — M25552 Pain in left hip: Secondary | ICD-10-CM

## 2018-06-10 DIAGNOSIS — G8929 Other chronic pain: Secondary | ICD-10-CM

## 2018-06-10 DIAGNOSIS — M6281 Muscle weakness (generalized): Secondary | ICD-10-CM

## 2018-06-10 DIAGNOSIS — M25551 Pain in right hip: Secondary | ICD-10-CM | POA: Diagnosis not present

## 2018-06-10 NOTE — Therapy (Signed)
[facility] [address] [place], [address] Phone: [phone]   Fax:  [phone]  Physical Therapy Treatment  Patient Details  Name: Nikala Walsworth MRN: [id number] Date of Birth: 04-18-1966 Referring Provider (PT): Poggi, Smith Mince, MD   Encounter Date: 06/10/2018  PT End of Session - 06/10/18 1727    Visit Number  4    Number of Visits  17    Date for PT Re-Evaluation  07/23/18    Authorization Type  [employer]    Authorization Time Period  Current cert period: 23/76/2831 - 07/23/2018 (last PN: IE 05/28/2018)    Authorization - Visit Number  4    Authorization - Number of Visits  10    PT Start Time  1620    PT Stop Time  1705    PT Time Calculation (min)  45 min    Activity Tolerance  Patient tolerated treatment well;Patient limited by fatigue    Behavior During Therapy  [facility] for tasks assessed/performed       Past Medical History:  Diagnosis Date  . Anxiety   . ASCUS with positive high risk HPV cervical   . CAD (coronary artery disease)   . Chicken pox   . Chronic pain    neck, back, b/l hips   . Depression   . GERD (gastroesophageal reflux disease)   . History of Crohn's disease   . Hyperlipidemia   . Left ovarian cyst    s/p removal of 1 ovary ? which one removed per pt   . Libido, decreased   . Skin cancer    reports skin cancer removed from skin in 2016 or 2017  . Thyroid nodule     Past Surgical History:  Procedure Laterality Date  . ABDOMINAL HYSTERECTOMY    . APPENDECTOMY  2004  . BLADDER SURGERY    . BREAST BIOPSY     x 2  . COLONOSCOPY    . OOPHORECTOMY Right    Unilateral Right (per MRI 04/17/2018)  . WISDOM TOOTH EXTRACTION      There were no vitals filed for this visit.  Subjective Assessment - 06/10/18 1626    Subjective  Pt states she had the nerve burning procedure last Wednesday. She was pretty sore for the next couple of days. She started doing her HEP following  that and was able to complete it with some temporary discomfort.  She went and got a new puppy since last visit a 87 week old (after her other dog of 15 years passed away a little over a week ago). She got a small dog.  He is a little terrier mix named Games developer. She brought him home today and she has been more active than usual today and thinks her back is a bit achy.     Pertinent History  Patient is a 52 y.o. female who presents to outpatient physical therapy with a referral for strain of right hip, partial glute med tear in right hip. This patient's chief complaints consist of pain, activity intolerance, concern about injuring it further, leading to the following functional deficits: difficulty with walking, standing, bending, lifting, climbing stairs, reaching feet, participating in valued activities such as gardening, hiking, traveling, exercise and physical activity.    Limitations  Standing;Walking;House hold activities    How long can you sit comfortably?  1.5 hours    How long can you stand comfortably?  <6  min    How long can  you walk comfortably?  10-15 min    Diagnostic tests  MRI, Radiographs (shows partial right glute med tear, see chart for more details).     Patient Stated Goals  Not to hurt. To be able to not to be able to not worry about how long she is standing, not to worry about her hip hurting while she is sleeping, for her neck not to hurt when she looks around.     Currently in Pain?  Yes    Pain Score  3     Pain Location  Back    Pain Descriptors / Indicators  Aching;Other (Comment)   tired   Pain Onset  More than a month ago    Pain Onset  --        PT Education - 06/10/18 1727    Education provided  Yes    Education Details  form, purpose of exercise. Instructions for between visits.     Person(s) Educated  Patient    Methods  Explanation;Demonstration;Verbal cues;Tactile cues    Comprehension  Verbalized understanding;Returned demonstration      Denies any  history of lumbar surgery Denies latex allergy  TREATMENT:  Therapeutic exercise:to centralize symptoms and improve ROM and strength required for successful completion of functional activities. - NuStep level 3 using bilateral upper and lower extremities. Seat setting 8. For improved extremity mobility, muscular endurance, and activity tolerance; and to induce the analgesic effect of aerobic exercise, stimulate improved joint nutrition, and prepare body structures and systems for following interventions. x 6  Minutes during subjective exam.  - abdominal brace with isometric press into theraball on elevated plinth, 5 second hold, x 10 to the front and each lateral.  - Standing rows with scapular retraction for improved postural and shoulder girdle strengthening and mobility. Required instruction for technique and cuing to retract, posteriorly tilt, and depress scapulae. x30, green theraband. -Standing pallof press (multifidus press) with green theraband x10 each side. Cuing for trunk activation. - prone hip extension with knees flexed to 90 degrees to bias glute max, 2x10 each side, 2# dumbbell held behind knee.  - prone bilateral hip IR knees, flexed to 90 degrees with yellow theraband loop around ankles. 2x10. - prone hip ER against yellow theraband loop held by clinician looped around ankle with knee flexed to 90 degrees, 2x10 each side.  - prone hamstring curls with 8# ankle weight, 2x10 on each side.  - supine alternating hip flexion with yellow theraloop around feet, 2x10 each side.  - supine bridge with wide feet, yellow theraloop around knees and active abduction against band with each rep. x 6  HOME EXERCISE PROGRAM Access Code: [email or web address]  URL: [email or web address]  Date: 05/30/2018  Prepared by: Rosita Kea   Exercises   Hooklying Isometric Clamshell - 10-15 reps - 5 second hold - 1 Sets - 1x daily - 7x weekly   Hooklying Isometric Hip Flexion - 10-15 reps - 5  second hold - 1 Sets - 1x daily - 7x weekly   Supine Hip Adduction Isometric with Ball - 10-15 reps - 5 second hold - 1 Sets - 1x daily - 7x weekly   Hooklying Gluteal Sets - 10-15 reps - 5 second hold - 1 Sets - 1x daily - 7x weekly   Supine Bilateral Hamstring Sets - 10-15 reps - 5 second hold - 1 Sets - 1x daily - 7x weekly   Prone Press Up - 10-15 reps - 1 second hold - 4x daily  Seated Correct Posture   Patient response to treatment:  Pt toleratedtreatmentwell. She arrived feeling better than at previous visits and was able to complete progressions in exercises and addition of new exercises without increased pain or discomfort. This is likely related to initial success of recent nerve ablation. Pt required multimodal cuing for proper technique and to facilitate improved neuromuscular control, strength, range of motion, and functional ability with improvement noted with cuing.She is making progress towards her goals at this point.    PT Short Term Goals - 06/10/18 1730      PT SHORT TERM GOAL #1   Baseline  initial HEP provided initial eval; pt reports completing regularly 06/10/2018.    Time  2    Period  Weeks    Status  Achieved    Target Date  06/11/18        PT Long Term Goals - 05/28/18 1809      PT LONG TERM GOAL #1   Title  Improve Lower Extremity Functional Scale score equal or greater than 50/80 to demonstrate improved self reported function.     Baseline  28/80 (05/28/2018);    Time  8    Period  Weeks    Target Date  06/05/18      PT LONG TERM GOAL #2   Title  Reduce pain with functional activities to equal or less than 3/10 to allow patient to complete usual activities including ADLs, IADLs, and social engagement with less difficulty.     Baseline  6/10 (05/28/2018);    Time  8    Period  Weeks    Status  New    Target Date  07/23/18      PT LONG TERM GOAL #4   Title  Pt will improve her neck FOTO score by at least 10 points as a demonstration  of improved function.     Baseline  48 (05/28/2018);      PT LONG TERM GOAL #5   Title  Patient will be able to return to fitness program including walking for 30 min a day without limitation due to current condition.     Baseline  10-15 min walking comfortably (05/28/2018)    Time  8    Period  Weeks    Status  New    Target Date  07/23/18            Plan - 06/10/18 1730    Clinical Impression Statement  Pt toleratedtreatmentwell. She arrived feeling better than at previous visits and was able to complete progressions in exercises and addition of new exercises without increased pain or discomfort. This is likely related to initial success of recent nerve ablation. Pt required multimodal cuing for proper technique and to facilitate improved neuromuscular control, strength, range of motion, and functional ability with improvement noted with cuing.She is making progress towards her goals at this point.    Rehab Potential  Good    Clinical Impairments Affecting Rehab Potential  (+) realistic expectations, motivation. (-) chronic nature of condition, multiple comorbidities that also affect function.     PT Frequency  2x / week    PT Duration  8 weeks    PT Treatment/Interventions  ADLs/Self Care Home Management;Aquatic Therapy;Biofeedback;Electrical Stimulation;Cryotherapy;Moist Heat;Gait training;Stair training;Functional mobility training;Therapeutic activities;Therapeutic exercise;Balance training;Neuromuscular re-education;Patient/family education;Manual techniques;Passive range of motion;Taping;Dry needling;Spinal Manipulations;Joint Manipulations;Other (comment)   joint mobilizations grades I-V   PT Next Visit Plan  continue graded functional, LE, and trunk exercises as tolerated.  PT Home Exercise Plan  Medbridge Access Code: [email or web address]     Consulted and Agree with Plan of Care  Patient       Patient will benefit from skilled therapeutic intervention in order to improve the  following deficits and impairments:  Improper body mechanics, Pain, Cardiopulmonary status limiting activity, Decreased mobility, Hypermobility, Increased muscle spasms, Postural dysfunction, Decreased activity tolerance, Decreased endurance, Decreased strength, Impaired perceived functional ability, Difficulty walking, Obesity  Visit Diagnosis: Pain in right hip  Chronic right-sided low back pain, unspecified whether sciatica present  Muscle weakness (generalized)  Cervicalgia  Pain in left hip     Problem List Patient Active Problem List   Diagnosis Date Noted  . Sprain and strain of hip and thigh 05/10/2018  . Abnormal MRI, cervical spine 03/28/2018  . Menopause 11/26/2017  . Right upper quadrant pain 11/26/2017  . Abnormal Pap smear of cervix 07/27/2017  . Vasomotor flushing 07/19/2017  . Depression 07/18/2017  . Nausea 06/25/2017  . Hip pain, right 06/25/2017  . OSA (obstructive sleep apnea) 04/18/2017  . Annual physical exam 04/10/2017  . Fatigue 12/16/2016  . Chronic back pain 05/30/2016  . Abdominal pain 03/12/2016  . Anxiety and depression 12/28/2015  . History of obstructive sleep apnea 12/15/2015  . Thyroid nodule 12/15/2015  . GERD (gastroesophageal reflux disease) 12/15/2015  . Hyperlipidemia 12/15/2015  . Pain medication agreement signed 03/25/2015  . CAD in native artery 02/05/2015  . Right supraspinatus tenosynovitis 08/17/2014  . Crohn's disease involving terminal ileum ([place]) 03/30/2014  . FH: colon polyps 03/30/2014  . 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 Other bursitis disorders 07/12/2012  . Pain in joint, pelvic region and thigh 07/12/2012  . Crohn's disease ([place]) 01/23/2012    Nancy Nordmann, PT, DPT 06/10/2018, 5:32 PM  [facility] [address] [place], [address] Phone: [phone]   Fax:  [phone]  Name: Elasia Furnish MRN: [id number] Date of Birth:  04/12/1966

## 2018-06-12 ENCOUNTER — Ambulatory Visit: Payer: Managed Care, Other (non HMO) | Admitting: Physical Therapy

## 2018-06-12 DIAGNOSIS — M25551 Pain in right hip: Secondary | ICD-10-CM | POA: Diagnosis not present

## 2018-06-12 DIAGNOSIS — G8929 Other chronic pain: Secondary | ICD-10-CM

## 2018-06-12 DIAGNOSIS — M545 Low back pain, unspecified: Secondary | ICD-10-CM

## 2018-06-12 DIAGNOSIS — M542 Cervicalgia: Secondary | ICD-10-CM

## 2018-06-12 DIAGNOSIS — M6281 Muscle weakness (generalized): Secondary | ICD-10-CM

## 2018-06-12 DIAGNOSIS — M25552 Pain in left hip: Secondary | ICD-10-CM

## 2018-06-12 NOTE — Telephone Encounter (Signed)
Patient has not heard anything back about scheduling   Please call

## 2018-06-12 NOTE — Therapy (Signed)
[facility] [address] [place], [address] Phone: [phone]   Fax:  [phone]  Physical Therapy Treatment  Patient Details  Name: Carolyn Esparza MRN: [id number] Date of Birth: 1966-02-19 Referring Provider (PT): Poggi, Smith Mince, MD   Encounter Date: 06/12/2018  PT End of Session - 06/12/18 1626    Visit Number  5    Number of Visits  17    Date for PT Re-Evaluation  07/23/18    Authorization Type  [employer]    Authorization Time Period  Current cert period: 61/44/3154 - 07/23/2018 (last PN: IE 05/28/2018)    Authorization - Visit Number  5    Authorization - Number of Visits  10    PT Start Time  1620    PT Stop Time  1700    PT Time Calculation (min)  40 min    Activity Tolerance  Patient tolerated treatment well;Patient limited by fatigue    Behavior During Therapy  [facility] for tasks assessed/performed       Past Medical History:  Diagnosis Date  . Anxiety   . ASCUS with positive high risk HPV cervical   . CAD (coronary artery disease)   . Chicken pox   . Chronic pain    neck, back, b/l hips   . Depression   . GERD (gastroesophageal reflux disease)   . History of Crohn's disease   . Hyperlipidemia   . Left ovarian cyst    s/p removal of 1 ovary ? which one removed per pt   . Libido, decreased   . Skin cancer    reports skin cancer removed from skin in 2016 or 2017  . Thyroid nodule     Past Surgical History:  Procedure Laterality Date  . ABDOMINAL HYSTERECTOMY    . APPENDECTOMY  2004  . BLADDER SURGERY    . BREAST BIOPSY     x 2  . COLONOSCOPY    . OOPHORECTOMY Right    Unilateral Right (per MRI 04/17/2018)  . WISDOM TOOTH EXTRACTION      There were no vitals filed for this visit.  Subjective Assessment - 06/12/18 1619    Subjective  Patient states she is doing well. Re-iterating she had some steroid with her nerve burning. She reports mild sore achiness (3/10) in the lower  back "nothing major." States she has been sitting all day and thinks her pain would be higher in her back if she had been more active. She is tired upon arrival. Her puppy is getting better at sleeping at night.     Pertinent History  Patient is a 52 y.o. female who presents to outpatient physical therapy with a referral for strain of right hip, partial glute med tear in right hip. This patient's chief complaints consist of pain, activity intolerance, concern about injuring it further, leading to the following functional deficits: difficulty with walking, standing, bending, lifting, climbing stairs, reaching feet, participating in valued activities such as gardening, hiking, traveling, exercise and physical activity.    Limitations  Standing;Walking;House hold activities    How long can you sit comfortably?  1.5 hours    How long can you stand comfortably?  <6  min    How long can you walk comfortably?  10-15 min    Diagnostic tests  MRI, Radiographs (shows partial right glute med tear, see chart for more details).     Patient Stated Goals  Not to hurt. To be able  to not to be able to not worry about how long she is standing, not to worry about her hip hurting while she is sleeping, for her neck not to hurt when she looks around.     Currently in Pain?  Yes    Pain Score  3     Pain Location  Back    Pain Orientation  Right;Lower    Pain Descriptors / Indicators  Aching    Pain Type  Chronic pain    Pain Onset  More than a month ago         PT Education - 06/12/18 1625    Education provided  Yes    Education Details  form, purpose of exercise, rationale for exercises    Person(s) Educated  Patient    Methods  Demonstration;Tactile cues;Explanation    Comprehension  Verbalized understanding;Returned demonstration        TREATMENT: Denies any history of lumbar surgery Denies latex allergy  Therapeutic exercise:to centralize symptoms and improve ROM and strength required for  successful completion of functional activities. -NuStep level5using bilateral upper and lower extremities. Seat setting8. For improved extremity mobility, muscular endurance, and activity tolerance; and to induce the analgesic effect of aerobic exercise, stimulate improved joint nutrition, and prepare body structures and systems for following interventions. x6Minutes during subjective exam.  - abdominal brace with bilateral shoulder extension press down at cable column, 2x10 at 15# - Standing rows with scapular retraction for improved postural and shoulder girdle strengthening and mobility. Required instruction for technique and cuing to retract, posteriorly tilt, and depress scapulae. 2x30, blue theraband. -Standing pallof press (multifidus press) with single blue theraband 2x10 each side. Cuing for trunk activation. -standing  hamstring curls with 5# ankle weight, 2x10 on each side.  - supine bridge with wide feet, red theraloop around knees and active abduction against band with each rep. 2x 10 - supine alternating hip flexion with red theraloop around feet, 2x10 each side.  - seated hip ER with legs hanging off edge of plinth and 5# ankle weights, 2x10 each side.  - long arc quads, 7# ankle weights, 2x10  Therapeutic activities: for functional strengthening and improved functional activity tolerance. - Side stepping with red theraband wrapped around legs and held at umbilicus to activate core and hip stabilizers and abductors in functional weightbearing position. Cuing to activate abdominals. Stepping a few steps out and in as allowed by band x 10 feet each side. - modified SLS (with contralateral foot on ball on floor) 2kg ball toss, x10 each side on firm surface. Gait belt with SBA for safety.  HOME EXERCISE PROGRAM Access Code: [email or web address]  URL: [email or web address]  Date: 06/12/2018  Prepared by: Rosita Kea   Exercises  Seated Correct Posture  Prone Press Up -  10-15 reps - 1 second hold - 4x daily  Hooklying Isometric Clamshell - 10-15 reps - 5 second hold - 1 Sets - 1x daily - 7x weekly  Supine Hip Adduction Isometric with Ball - 10-15 reps - 5 second hold - 1 Sets - 1x daily - 7x weekly  Bridge with Hip Abduction and Resistance - 10-15 reps - 1 second hold - 3 Sets - 1x daily - 3x weekly  Supine Hip Flexion with Resistance Loop - 10-15 reps - 1 second hold - 3 Sets - 1x daily - 3x weekly    Patient response to treatment:  Pt toleratedtreatmentwell. She arrived with low pain and was able to complete progressions in exercises and addition  of new exercises without lasting increased pain or discomfort. She had some temporary discomfort with side stepping and bridge with hip abduction that dissipated with rest. Her HEP was advanced slightly due to good tolerance to exercises introduced at last treatment session. Pt fatigued quickly. Pt required multimodal cuing for proper technique and to facilitate improved neuromuscular control, strength, range of motion, and functional ability with improvement noted with cuing.Sheis making progress towards her goals at this point.  PT Short Term Goals - 06/10/18 1730      PT SHORT TERM GOAL #1   Baseline  initial HEP provided initial eval; pt reports completing regularly 06/10/2018.    Time  2    Period  Weeks    Status  Achieved    Target Date  06/11/18        PT Long Term Goals - 05/28/18 1809      PT LONG TERM GOAL #1   Title  Improve Lower Extremity Functional Scale score equal or greater than 50/80 to demonstrate improved self reported function.     Baseline  28/80 (05/28/2018);    Time  8    Period  Weeks    Target Date  06/05/18      PT LONG TERM GOAL #2   Title  Reduce pain with functional activities to equal or less than 3/10 to allow patient to complete usual activities including ADLs, IADLs, and social engagement with less difficulty.     Baseline  6/10 (05/28/2018);    Time  8    Period   Weeks    Status  New    Target Date  07/23/18      PT LONG TERM GOAL #4   Title  Pt will improve her neck FOTO score by at least 10 points as a demonstration of improved function.     Baseline  48 (05/28/2018);      PT LONG TERM GOAL #5   Title  Patient will be able to return to fitness program including walking for 30 min a day without limitation due to current condition.     Baseline  10-15 min walking comfortably (05/28/2018)    Time  8    Period  Weeks    Status  New    Target Date  07/23/18            Plan - 06/12/18 1709    Clinical Impression Statement  Pt toleratedtreatmentwell. She arrived with low pain and was able to complete progressions in exercises and addition of new exercises without lasting increased pain or discomfort. She had some temporary discomfort with side stepping and bridge with hip abduction that dissipated with rest. Her HEP was advanced slightly due to good tolerance to exercises introduced at last treatment session. Pt fatigued quickly. Pt required multimodal cuing for proper technique and to facilitate improved neuromuscular control, strength, range of motion, and functional ability with improvement noted with cuing.Sheis making progress towards her goals at this point.    Rehab Potential  Good    Clinical Impairments Affecting Rehab Potential  (+) realistic expectations, motivation. (-) chronic nature of condition, multiple comorbidities that also affect function.     PT Frequency  2x / week    PT Duration  8 weeks    PT Treatment/Interventions  ADLs/Self Care Home Management;Aquatic Therapy;Biofeedback;Electrical Stimulation;Cryotherapy;Moist Heat;Gait training;Stair training;Functional mobility training;Therapeutic activities;Therapeutic exercise;Balance training;Neuromuscular re-education;Patient/family education;Manual techniques;Passive range of motion;Taping;Dry needling;Spinal Manipulations;Joint Manipulations;Other (comment)   joint  mobilizations grades I-V   PT  Next Visit Plan  continue graded functional, LE, and trunk exercises as tolerated.     PT Home Exercise Plan  Medbridge Access Code: [email or web address]     Consulted and Agree with Plan of Care  Patient       Patient will benefit from skilled therapeutic intervention in order to improve the following deficits and impairments:  Improper body mechanics, Pain, Cardiopulmonary status limiting activity, Decreased mobility, Hypermobility, Increased muscle spasms, Postural dysfunction, Decreased activity tolerance, Decreased endurance, Decreased strength, Impaired perceived functional ability, Difficulty walking, Obesity  Visit Diagnosis: Pain in right hip  Chronic right-sided low back pain, unspecified whether sciatica present  Muscle weakness (generalized)  Cervicalgia  Pain in left hip     Problem List Patient Active Problem List   Diagnosis Date Noted  . Sprain and strain of hip and thigh 05/10/2018  . Abnormal MRI, cervical spine 03/28/2018  . Menopause 11/26/2017  . Right upper quadrant pain 11/26/2017  . Abnormal Pap smear of cervix 07/27/2017  . Vasomotor flushing 07/19/2017  . Depression 07/18/2017  . Nausea 06/25/2017  . Hip pain, right 06/25/2017  . OSA (obstructive sleep apnea) 04/18/2017  . Annual physical exam 04/10/2017  . Fatigue 12/16/2016  . Chronic back pain 05/30/2016  . Abdominal pain 03/12/2016  . Anxiety and depression 12/28/2015  . History of obstructive sleep apnea 12/15/2015  . Thyroid nodule 12/15/2015  . GERD (gastroesophageal reflux disease) 12/15/2015  . Hyperlipidemia 12/15/2015  . Pain medication agreement signed 03/25/2015  . CAD in native artery 02/05/2015  . Right supraspinatus tenosynovitis 08/17/2014  . Crohn's disease involving terminal ileum ([place]) 03/30/2014  . FH: colon polyps 03/30/2014  . 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 Other bursitis disorders 07/12/2012  . Pain in joint, pelvic region and thigh  07/12/2012  . Crohn's disease ([place]) 01/23/2012    Nancy Nordmann, PT, DPT 06/12/2018, 5:11 PM  [facility] [address] [place], [address] Phone: [phone]   Fax:  [phone]  Name: Shirel Mallis MRN: [id number] Date of Birth: 01-07-1966

## 2018-06-13 ENCOUNTER — Ambulatory Visit (INDEPENDENT_AMBULATORY_CARE_PROVIDER_SITE_OTHER): Payer: Managed Care, Other (non HMO) | Admitting: *Deleted

## 2018-06-13 DIAGNOSIS — Z23 Encounter for immunization: Secondary | ICD-10-CM

## 2018-06-13 NOTE — Telephone Encounter (Signed)
Returned call and advised patient it may take 2-3 weeks for PA on both sleep test. Patient verbalized understanding.

## 2018-06-17 ENCOUNTER — Encounter: Payer: Self-pay | Admitting: Physical Therapy

## 2018-06-17 ENCOUNTER — Ambulatory Visit: Payer: Managed Care, Other (non HMO) | Admitting: Physical Therapy

## 2018-06-17 DIAGNOSIS — M6281 Muscle weakness (generalized): Secondary | ICD-10-CM

## 2018-06-17 DIAGNOSIS — M25551 Pain in right hip: Secondary | ICD-10-CM | POA: Diagnosis not present

## 2018-06-17 DIAGNOSIS — M25552 Pain in left hip: Secondary | ICD-10-CM

## 2018-06-17 DIAGNOSIS — M542 Cervicalgia: Secondary | ICD-10-CM

## 2018-06-17 DIAGNOSIS — M545 Low back pain, unspecified: Secondary | ICD-10-CM

## 2018-06-17 DIAGNOSIS — G8929 Other chronic pain: Secondary | ICD-10-CM

## 2018-06-17 NOTE — Therapy (Signed)
[facility] [address], [place], [address] Phone: [phone]   Fax:  [phone]  Physical Therapy Treatment  Patient Details  Name: Carolyn Esparza MRN: [id number] Date of Birth: 1966-05-07 Referring Provider (PT): Poggi, Smith Mince, MD   Encounter Date: 06/17/2018  PT End of Session - 06/17/18 1618    Visit Number  6    Number of Visits  17    Date for PT Re-Evaluation  07/23/18    Authorization Type  [employer]    Authorization Time Period  Current cert period: 23/55/7322 - 07/23/2018 (last PN: IE 05/28/2018)    Authorization - Visit Number  6    Authorization - Number of Visits  10    PT Start Time  1620    PT Stop Time  1700    PT Time Calculation (min)  40 min    Activity Tolerance  Patient tolerated treatment well;Patient limited by fatigue    Behavior During Therapy  [facility] for tasks assessed/performed       Past Medical History:  Diagnosis Date  . Anxiety   . ASCUS with positive high risk HPV cervical   . CAD (coronary artery disease)   . Chicken pox   . Chronic pain    neck, back, b/l hips   . Depression   . GERD (gastroesophageal reflux disease)   . History of Crohn's disease   . Hyperlipidemia   . Left ovarian cyst    s/p removal of 1 ovary ? which one removed per pt   . Libido, decreased   . Skin cancer    reports skin cancer removed from skin in 2016 or 2017  . Thyroid nodule     Past Surgical History:  Procedure Laterality Date  . ABDOMINAL HYSTERECTOMY    . APPENDECTOMY  2004  . BLADDER SURGERY    . BREAST BIOPSY     x 2  . COLONOSCOPY    . OOPHORECTOMY Right    Unilateral Right (per MRI 04/17/2018)  . WISDOM TOOTH EXTRACTION      There were no vitals filed for this visit.  Subjective Assessment - 06/17/18 1619    Subjective  Pt reports no pain upon arrival. States she felt fine after last session with no excessive pain or soreness. Reports she has been pretty much pain  free except after practicing some stairs today at work. She has started doing this to improve her fitness level and today is the first time she had a twinge of pain in her hip but it went away with rest. Reports getting fatigued at the end of her HEP but no pain.     Pertinent History  Patient is a 52 y.o. female who presents to outpatient physical therapy with a referral for strain of right hip, partial glute med tear in right hip. This patient's chief complaints consist of pain, activity intolerance, concern about injuring it further, leading to the following functional deficits: difficulty with walking, standing, bending, lifting, climbing stairs, reaching feet, participating in valued activities such as gardening, hiking, traveling, exercise and physical activity.    Limitations  Standing;Walking;House hold activities    How long can you sit comfortably?  1.5 hours    How long can you stand comfortably?  <6  min    How long can you walk comfortably?  10-15 min    Diagnostic tests  MRI, Radiographs (shows partial right glute med tear, see chart for more details).  Patient Stated Goals  Not to hurt. To be able to not to be able to not worry about how long she is standing, not to worry about her hip hurting while she is sleeping, for her neck not to hurt when she looks around.     Currently in Pain?  No/denies    Pain Onset  More than a month ago         PT Education - 06/17/18 1617    Education provided  Yes    Education Details  form, purpose of exercise    Person(s) Educated  Patient    Methods  Explanation;Demonstration;Tactile cues    Comprehension  Verbalized understanding;Returned demonstration      TREATMENT: Denies any history of lumbar surgery Denies latex allergy  Therapeutic exercise:to centralize symptoms and improve ROM and strength required for successful completion of functional activities. -NuStep level5using bilateral upper and lower extremities. Seat  setting8. For improved extremity mobility, muscular endurance, and activity tolerance; and to induce the analgesic effect of aerobic exercise, stimulate improved joint nutrition, and prepare body structures and systems for following interventions. x6Minutes during subjective exam. -Standing pallof press (multifidus press) withsingle black theratube 2x10 each side. Cuing for trunk activation. -standing  hamstring curls with7# ankle weight, 2x10 on each side.  - seated hip ER with legs hanging off edge of plinth and 7# ankle weights, 2x10 each side.  - long arc quads, 10# ankle weights, 2x10 each side  Therapeutic activities: for functional strengthening and improved functional activity tolerance. Exercises performed as part of a circuit to allow pt to maximize efficiency and allow greater rest of muscle group between sets.   - Side stepping with red theraband wrapped around legs and held at umbilicus to activate core and hip stabilizers and abductors in functional weightbearing position. Cuing to activate abdominals. Stepping a few steps out and in as allowed by band 2 x 20 feet each side. - modified SLS (with contralateral foot on ball on floor) 2kg ball toss, 2x10 each side on firm surface. Gait belt with SBA for safety. -Squats with buttocks tap on 18" chair focusing on glute activation and proper form with hip hinging, stabilized back, and tibial perpendicular to floor with knees behind toes. Green theraband wrapped around distal femurs for isometric hip abduction/ER force 2x10   HOME EXERCISE PROGRAM Access Code: [email or web address]  URL: [email or web address]  Date: 06/12/2018  Prepared by: Rosita Kea   Exercises   Seated Correct Posture   Prone Press Up - 10-15 reps - 1 second hold - 4x daily   Hooklying Isometric Clamshell - 10-15 reps - 5 second hold - 1 Sets - 1x daily - 7x weekly   Supine Hip Adduction Isometric with Ball - 10-15 reps - 5 second hold - 1 Sets - 1x daily  - 7x weekly   Bridge with Hip Abduction and Resistance - 10-15 reps - 1 second hold - 3 Sets - 1x daily - 3x weekly   Supine Hip Flexion with Resistance Loop - 10-15 reps - 1 second hold - 3 Sets - 1x daily - 3x weekly    Patient response to treatment:  Pt toleratedtreatmentwell.She arrived with no pain and was able to complete all exercises and activities without lasting increased pain or discomfort. She fatigued quickly but was able to continue with rest. She progressed to more weight bearing activities with good tolerance for progressions.  Pt requiredmultimodalcuing for proper technique and to facilitate improved neuromuscular control, strength, range of motion, and  functional ability with improvement noted with cuing.Sheis making progress towards her goals at this point.  PT Short Term Goals - 06/10/18 1730      PT SHORT TERM GOAL #1   Baseline  initial HEP provided initial eval; pt reports completing regularly 06/10/2018.    Time  2    Period  Weeks    Status  Achieved    Target Date  06/11/18        PT Long Term Goals - 05/28/18 1809      PT LONG TERM GOAL #1   Title  Improve Lower Extremity Functional Scale score equal or greater than 50/80 to demonstrate improved self reported function.     Baseline  28/80 (05/28/2018);    Time  8    Period  Weeks    Target Date  06/05/18      PT LONG TERM GOAL #2   Title  Reduce pain with functional activities to equal or less than 3/10 to allow patient to complete usual activities including ADLs, IADLs, and social engagement with less difficulty.     Baseline  6/10 (05/28/2018);    Time  8    Period  Weeks    Status  New    Target Date  07/23/18      PT LONG TERM GOAL #4   Title  Pt will improve her neck FOTO score by at least 10 points as a demonstration of improved function.     Baseline  48 (05/28/2018);      PT LONG TERM GOAL #5   Title  Patient will be able to return to fitness program including walking for 30 min  a day without limitation due to current condition.     Baseline  10-15 min walking comfortably (05/28/2018)    Time  8    Period  Weeks    Status  New    Target Date  07/23/18            Plan - 06/18/18 0707    Clinical Impression Statement  Pt toleratedtreatmentwell.She arrived with no pain and was able to complete all exercises and activities without lasting increased pain or discomfort. She fatigued quickly but was able to continue with rest. She progressed to more weight bearing activities with good tolerance for progressions.  Pt requiredmultimodalcuing for proper technique and to facilitate improved neuromuscular control, strength, range of motion, and functional ability with improvement noted with cuing.Sheis making progress towards her goals at this point.    Rehab Potential  Good    Clinical Impairments Affecting Rehab Potential  (+) realistic expectations, motivation. (-) chronic nature of condition, multiple comorbidities that also affect function.     PT Frequency  2x / week    PT Duration  8 weeks    PT Treatment/Interventions  ADLs/Self Care Home Management;Aquatic Therapy;Biofeedback;Electrical Stimulation;Cryotherapy;Moist Heat;Gait training;Stair training;Functional mobility training;Therapeutic activities;Therapeutic exercise;Balance training;Neuromuscular re-education;Patient/family education;Manual techniques;Passive range of motion;Taping;Dry needling;Spinal Manipulations;Joint Manipulations;Other (comment)   joint mobilizations grades I-V   PT Next Visit Plan  continue graded functional, LE, and trunk exercises as tolerated.  Update HEP if continues with good tolerance to standing.     PT Home Exercise Plan  Medbridge Access Code: [email or web address]     Consulted and Agree with Plan of Care  Patient       Patient will benefit from skilled therapeutic intervention in order to improve the following deficits and impairments:  Improper body mechanics, Pain, Cardiopulmonary  status limiting activity, Decreased mobility, Hypermobility,  Increased muscle spasms, Postural dysfunction, Decreased activity tolerance, Decreased endurance, Decreased strength, Impaired perceived functional ability, Difficulty walking, Obesity  Visit Diagnosis: Pain in right hip  Chronic right-sided low back pain, unspecified whether sciatica present  Muscle weakness (generalized)  Cervicalgia  Pain in left hip     Problem List Patient Active Problem List   Diagnosis Date Noted  . Sprain and strain of hip and thigh 05/10/2018  . Abnormal MRI, cervical spine 03/28/2018  . Menopause 11/26/2017  . Right upper quadrant pain 11/26/2017  . Abnormal Pap smear of cervix 07/27/2017  . Vasomotor flushing 07/19/2017  . Depression 07/18/2017  . Nausea 06/25/2017  . Hip pain, right 06/25/2017  . OSA (obstructive sleep apnea) 04/18/2017  . Annual physical exam 04/10/2017  . Fatigue 12/16/2016  . Chronic back pain 05/30/2016  . Abdominal pain 03/12/2016  . Anxiety and depression 12/28/2015  . History of obstructive sleep apnea 12/15/2015  . Thyroid nodule 12/15/2015  . GERD (gastroesophageal reflux disease) 12/15/2015  . Hyperlipidemia 12/15/2015  . Pain medication agreement signed 03/25/2015  . CAD in native artery 02/05/2015  . Right supraspinatus tenosynovitis 08/17/2014  . Crohn's disease involving terminal ileum ([place]) 03/30/2014  . FH: colon polyps 03/30/2014  . 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 Other bursitis disorders 07/12/2012  . Pain in joint, pelvic region and thigh 07/12/2012  . Crohn's disease ([place]) 01/23/2012    Nancy Nordmann, PT, DPT 06/18/2018, 7:09 AM  [facility] [address] [place], [address] Phone: [phone]   Fax:  [phone]  Name: Carolyn Esparza MRN: [id number] Date of Birth: [birth year]

## 2018-06-19 ENCOUNTER — Encounter: Payer: Self-pay | Admitting: Physical Therapy

## 2018-06-19 ENCOUNTER — Ambulatory Visit: Payer: Managed Care, Other (non HMO) | Admitting: Physical Therapy

## 2018-06-19 DIAGNOSIS — M25552 Pain in left hip: Secondary | ICD-10-CM

## 2018-06-19 DIAGNOSIS — M545 Low back pain, unspecified: Secondary | ICD-10-CM

## 2018-06-19 DIAGNOSIS — M542 Cervicalgia: Secondary | ICD-10-CM

## 2018-06-19 DIAGNOSIS — G8929 Other chronic pain: Secondary | ICD-10-CM

## 2018-06-19 DIAGNOSIS — M25551 Pain in right hip: Secondary | ICD-10-CM | POA: Diagnosis not present

## 2018-06-19 DIAGNOSIS — M6281 Muscle weakness (generalized): Secondary | ICD-10-CM

## 2018-06-19 NOTE — Therapy (Signed)
[place] [facility] [address] [place], [address] Phone: [phone]   Fax:  [phone]  Physical Therapy Treatment  Patient Details  Name: Carolyn Esparza MRN: [id number] Date of Birth: 10/08/1965 Referring Provider (PT): Poggi, Smith Mince, MD   Encounter Date: 06/19/2018  PT End of Session - 06/19/18 1623    Visit Number  7    Number of Visits  17    Date for PT Re-Evaluation  07/23/18    Authorization Type  [employer]    Authorization Time Period  Current cert period: 59/45/8592 - 07/23/2018 (last PN: IE 05/28/2018)    Authorization - Visit Number  7    Authorization - Number of Visits  10    PT Start Time  9244    PT Stop Time  1700    PT Time Calculation (min)  43 min    Activity Tolerance  Patient tolerated treatment well;Patient limited by fatigue    Behavior During Therapy  [facility] for tasks assessed/performed       Past Medical History:  Diagnosis Date  . Anxiety   . ASCUS with positive high risk HPV cervical   . CAD (coronary artery disease)   . Chicken pox   . Chronic pain    neck, back, b/l hips   . Depression   . GERD (gastroesophageal reflux disease)   . History of Crohn's disease   . Hyperlipidemia   . Left ovarian cyst    s/p removal of 1 ovary ? which one removed per pt   . Libido, decreased   . Skin cancer    reports skin cancer removed from skin in 2016 or 2017  . Thyroid nodule     Past Surgical History:  Procedure Laterality Date  . ABDOMINAL HYSTERECTOMY    . APPENDECTOMY  2004  . BLADDER SURGERY    . BREAST BIOPSY     x 2  . COLONOSCOPY    . OOPHORECTOMY Right    Unilateral Right (per MRI 04/17/2018)  . WISDOM TOOTH EXTRACTION      There were no vitals filed for this visit.  Subjective Assessment - 06/19/18 1621    Subjective  Patient reports she is feeling well and ready to participate in physical therapy. She reports no pain upon arrival. States she was quite fatigued  after last session but had no excessive soreness or pain. Did not do her HEP since last visit.     Pertinent History  Patient is a 52 y.o. female who presents to outpatient physical therapy with a referral for strain of right hip, partial glute med tear in right hip. This patient's chief complaints consist of pain, activity intolerance, concern about injuring it further, leading to the following functional deficits: difficulty with walking, standing, bending, lifting, climbing stairs, reaching feet, participating in valued activities such as gardening, hiking, traveling, exercise and physical activity.    Limitations  Standing;Walking;House hold activities    How long can you sit comfortably?  1.5 hours    How long can you stand comfortably?  <6  min    How long can you walk comfortably?  10-15 min    Diagnostic tests  MRI, Radiographs (shows partial right glute med tear, see chart for more details).     Patient Stated Goals  Not to hurt. To be able to not to be able to not worry about how long she is standing, not to worry about her hip hurting while  she is sleeping, for her neck not to hurt when she looks around.          PT Education - 06/19/18 1622    Education provided  Yes    Education Details  form, purpose of exercise    Person(s) Educated  Patient    Methods  Explanation;Demonstration;Tactile cues    Comprehension  Verbalized understanding;Returned demonstration      TREATMENT: Denies any history of lumbar surgery Denies latex allergy  Therapeutic exercise:to centralize symptoms and improve ROM and strength required for successful completion of functional activities. -NuStep level6using bilateral upper and lower extremities. Seat setting9. For improved extremity mobility, muscular endurance, and activity tolerance; and to induce the analgesic effect of aerobic exercise, stimulate improved joint nutrition, and prepare body structures and systems for following interventions.  x6Minutes during subjective exam. -Quadruped bird dog (alternating shoulder flexion/contralateral hip extension with core muscles braced) , Cuing for abdominal brace. X 10 each side plus time for instruction, rest, and transition. -.dead bug - seated hip ER with legs hanging off edge of plinth and 10# ankle weights, 2x15 each side.  - long arc quads, 14# ankle weights, 2x15 each side  Therapeutic activities: for functional strengthening and improved functional activity tolerance. Exercises performed as part of a circuit to allow pt to maximize efficiency and allow greater rest of muscle group between sets.   -Side stepping with redtheraband wrapped around legs and held at umbilicus to activate core and hip stabilizers and abductors in functional weightbearing position. Cuing to activate abdominals. Stepping a few steps out and in as allowed by band 2 x 20 feet each side. - SLS, 2kg ball toss, 2x10 each side on firm surface. Gait belt with SBA for safety.  -Squats with buttocks tap on 18" chair focusing on glute activation and proper form with hip hinging, stabilized back, and tibial perpendicular to floor with knees behind toes. Green theraband wrapped around distal femurs for isometric hip abduction/ER force 2x15   HOME EXERCISE PROGRAM Access Code: [email or web address]  URL: [email or web address]  Date: 06/19/2018  Prepared by: Rosita Kea   Exercises  Seated Correct Posture  Prone Press Up - 10-15 reps - 1 second hold - 4x daily  Supine Hip Adduction Isometric with Ball - 10-15 reps - 5 second hold - 1 Sets - 1x daily - 7x weekly  Bridge with Hip Abduction and Resistance - 10-15 reps - 1 second hold - 3 Sets - 1x daily - 3x weekly  Supine Hip Flexion with Resistance Loop - 10-15 reps - 1 second hold - 3 Sets - 1x daily - 3x weekly  Squat with Chair Touch and Resistance Loop - 10-15 reps - 1 second hold - 3 Sets - 1x daily - 3x weekly  X Band Walk - 30 reps - 2 Sets - 1x daily -  3x weekly    Patient response to treatment:  Pt toleratedtreatmentwell.She arrivedwith no pain and was able to complete all exercises and activities withoutlastingincreased pain or discomfort. She fatigued quickly but was able to continue with rest. She continues to tolerate progressions in intensity well and HEP was updated to include more challenging exercises today. Pt requiredmultimodalcuing for proper technique and to facilitate improved neuromuscular control, strength, range of motion, and functional ability with improvement noted with cuing.Sheis making progress towards her goals at this point.  PT Short Term Goals - 06/10/18 1730      PT SHORT TERM GOAL #1   Baseline  initial HEP provided initial  eval; pt reports completing regularly 06/10/2018.    Time  2    Period  Weeks    Status  Achieved    Target Date  06/11/18        PT Long Term Goals - 05/28/18 1809      PT LONG TERM GOAL #1   Title  Improve Lower Extremity Functional Scale score equal or greater than 50/80 to demonstrate improved self reported function.     Baseline  28/80 (05/28/2018);    Time  8    Period  Weeks    Target Date  06/05/18      PT LONG TERM GOAL #2   Title  Reduce pain with functional activities to equal or less than 3/10 to allow patient to complete usual activities including ADLs, IADLs, and social engagement with less difficulty.     Baseline  6/10 (05/28/2018);    Time  8    Period  Weeks    Status  New    Target Date  07/23/18      PT LONG TERM GOAL #4   Title  Pt will improve her neck FOTO score by at least 10 points as a demonstration of improved function.     Baseline  48 (05/28/2018);      PT LONG TERM GOAL #5   Title  Patient will be able to return to fitness program including walking for 30 min a day without limitation due to current condition.     Baseline  10-15 min walking comfortably (05/28/2018)    Time  8    Period  Weeks    Status  New    Target Date   07/23/18            Plan - 06/19/18 2142    Clinical Impression Statement  Pt toleratedtreatmentwell.She arrivedwith no pain and was able to complete all exercises and activities withoutlastingincreased pain or discomfort. She fatigued quickly but was able to continue with rest. She continues to tolerate progressions in intensity well and HEP was updated to include more challenging exercises today. Pt requiredmultimodalcuing for proper technique and to facilitate improved neuromuscular control, strength, range of motion, and functional ability with improvement noted with cuing.Sheis making progress towards her goals at this point    Rehab Potential  Good    Clinical Impairments Affecting Rehab Potential  (+) realistic expectations, motivation. (-) chronic nature of condition, multiple comorbidities that also affect function.     PT Frequency  2x / week    PT Duration  8 weeks    PT Treatment/Interventions  ADLs/Self Care Home Management;Aquatic Therapy;Biofeedback;Electrical Stimulation;Cryotherapy;Moist Heat;Gait training;Stair training;Functional mobility training;Therapeutic activities;Therapeutic exercise;Balance training;Neuromuscular re-education;Patient/family education;Manual techniques;Passive range of motion;Taping;Dry needling;Spinal Manipulations;Joint Manipulations;Other (comment)   joint mobilizations grades I-V   PT Next Visit Plan  continue graded functional, LE, and trunk exercises as tolerated.      PT Home Exercise Plan  Medbridge Access Code: [email or web address]     Consulted and Agree with Plan of Care  Patient       Patient will benefit from skilled therapeutic intervention in order to improve the following deficits and impairments:  Improper body mechanics, Pain, Cardiopulmonary status limiting activity, Decreased mobility, Hypermobility, Increased muscle spasms, Postural dysfunction, Decreased activity tolerance, Decreased endurance, Decreased strength, Impaired  perceived functional ability, Difficulty walking, Obesity  Visit Diagnosis: Pain in right hip  Chronic right-sided low back pain, unspecified whether sciatica present  Muscle weakness (generalized)  Cervicalgia  Pain in left hip  Problem List Patient Active Problem List   Diagnosis Date Noted  . Sprain and strain of hip and thigh 05/10/2018  . Abnormal MRI, cervical spine 03/28/2018  . Menopause 11/26/2017  . Right upper quadrant pain 11/26/2017  . Abnormal Pap smear of cervix 07/27/2017  . Vasomotor flushing 07/19/2017  . Depression 07/18/2017  . Nausea 06/25/2017  . Hip pain, right 06/25/2017  . OSA (obstructive sleep apnea) 04/18/2017  . Annual physical exam 04/10/2017  . Fatigue 12/16/2016  . Chronic back pain 05/30/2016  . Abdominal pain 03/12/2016  . Anxiety and depression 12/28/2015  . History of obstructive sleep apnea 12/15/2015  . Thyroid nodule 12/15/2015  . GERD (gastroesophageal reflux disease) 12/15/2015  . Hyperlipidemia 12/15/2015  . Pain medication agreement signed 03/25/2015  . CAD in native artery 02/05/2015  . Right supraspinatus tenosynovitis 08/17/2014  . Crohn's disease involving terminal ileum ([place]) 03/30/2014  . FH: colon polyps 03/30/2014  . 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 Other bursitis disorders 07/12/2012  . Pain in joint, pelvic region and thigh 07/12/2012  . Crohn's disease ([place]) 01/23/2012    Nancy Nordmann, PT, DPT 06/19/2018, 9:43 PM  [place] [facility] [address] [place], [address] Phone: [phone]   Fax:  [phone]  Name: Lillyan Hitson MRN: [id number] Date of Birth: [birth year]

## 2018-06-24 ENCOUNTER — Encounter: Payer: Self-pay | Admitting: Physical Therapy

## 2018-06-24 ENCOUNTER — Ambulatory Visit: Payer: Managed Care, Other (non HMO) | Admitting: Physical Therapy

## 2018-06-24 DIAGNOSIS — M542 Cervicalgia: Secondary | ICD-10-CM

## 2018-06-24 DIAGNOSIS — M25551 Pain in right hip: Secondary | ICD-10-CM | POA: Diagnosis not present

## 2018-06-24 DIAGNOSIS — M545 Low back pain, unspecified: Secondary | ICD-10-CM

## 2018-06-24 DIAGNOSIS — M6281 Muscle weakness (generalized): Secondary | ICD-10-CM

## 2018-06-24 DIAGNOSIS — G8929 Other chronic pain: Secondary | ICD-10-CM

## 2018-06-24 DIAGNOSIS — M25552 Pain in left hip: Secondary | ICD-10-CM

## 2018-06-24 NOTE — Telephone Encounter (Signed)
06/24/18  Received denial from [facility] that procedure code [phone] and [phone] has been denied stating that there has been no documentation of Narcolepsy documented in patient's records.  Insurance is requesting a peer to peer from Dr. Ashby Dawes by calling the Medical Director at [phone] 8am - 6 pm EST.   Called and advised the patient that the both studies have been denied and that she should receive a letter from [place].    Phone note routed to physician and necessary documentation, previous sleep studies, ov note and needed phone number and tracking # in order to pull patient up with insurance.   Dr. Ashby Dawes, please advise. [name]

## 2018-06-24 NOTE — Therapy (Signed)
[place] [facility] [address], [place], [address] Phone: [phone]   Fax:  [phone]  Physical Therapy Treatment  Patient Details  Name: Carolyn Esparza MRN: [id number] Date of Birth: May 31, 1966 Referring Provider (PT): Poggi, Smith Mince, MD   Encounter Date: 06/24/2018  PT End of Session - 06/24/18 1615    Visit Number  8    Number of Visits  17    Date for PT Re-Evaluation  07/23/18    Authorization Type  [employer]    Authorization Time Period  Current cert period: 67/89/3810 - 07/23/2018 (last PN: IE 05/28/2018)    Authorization - Visit Number  8    Authorization - Number of Visits  10    PT Start Time  1615    PT Stop Time  1700    PT Time Calculation (min)  45 min    Activity Tolerance  Patient tolerated treatment well;Patient limited by fatigue    Behavior During Therapy  [facility] for tasks assessed/performed       Past Medical History:  Diagnosis Date  . Anxiety   . ASCUS with positive high risk HPV cervical   . CAD (coronary artery disease)   . Chicken pox   . Chronic pain    neck, back, b/l hips   . Depression   . GERD (gastroesophageal reflux disease)   . History of Crohn's disease   . Hyperlipidemia   . Left ovarian cyst    s/p removal of 1 ovary ? which one removed per pt   . Libido, decreased   . Skin cancer    reports skin cancer removed from skin in 2016 or 2017  . Thyroid nodule     Past Surgical History:  Procedure Laterality Date  . ABDOMINAL HYSTERECTOMY    . APPENDECTOMY  2004  . BLADDER SURGERY    . BREAST BIOPSY     x 2  . COLONOSCOPY    . OOPHORECTOMY Right    Unilateral Right (per MRI 04/17/2018)  . WISDOM TOOTH EXTRACTION      There were no vitals filed for this visit.  Subjective Assessment - 06/24/18 1618    Subjective  Patient reports she hasn't been sleeping great but she is unsure why. Her puppy is sleeping through the night now. She states she feels the  steroid part of the nerve ablation she got is starting to wear off and she is starting to feel pain in her neck and hip. Today she has felt fine but she started limping by the time she got to the back of [employer] yesterday when she was shopping. She states she felt fine after her last treatment session.  States she did not do all of her HEP yesterday due to fatigue. She attempted to do them all before she went out yesterday.  She did not try them on any other days.     Pertinent History  Patient is a 52 y.o. female who presents to outpatient physical therapy with a referral for strain of right hip, partial glute med tear in right hip. This patient's chief complaints consist of pain, activity intolerance, concern about injuring it further, leading to the following functional deficits: difficulty with walking, standing, bending, lifting, climbing stairs, reaching feet, participating in valued activities such as gardening, hiking, traveling, exercise and physical activity.    Limitations  Standing;Walking;House hold activities    How long can you sit comfortably?  1.5 hours  How long can you stand comfortably?  <6  min    How long can you walk comfortably?  10-15 min    Diagnostic tests  MRI, Radiographs (shows partial right glute med tear, see chart for more details).     Patient Stated Goals  Not to hurt. To be able to not to be able to not worry about how long she is standing, not to worry about her hip hurting while she is sleeping, for her neck not to hurt when she looks around.     Currently in Pain?  No/denies        PT Education - 06/24/18 1615    Education provided  Yes    Education Details  form, purpose of exercises    Person(s) Educated  Patient    Methods  Explanation;Demonstration;Tactile cues;Verbal cues    Comprehension  Verbalized understanding;Returned demonstration      TREATMENT: Denies any history of lumbar surgery Denies latex allergy  Therapeutic exercise:to centralize  symptoms and improve ROM and strength required for successful completion of functional activities. -NuStep level6using bilateral upper and lower extremities. Seat setting9. For improved extremity mobility, muscular endurance, and activity tolerance; and to induce the analgesic effect of aerobic exercise, stimulate improved joint nutrition, and prepare body structures and systems for following interventions. x6Minutes during subjective exam. -Quadruped bird dog (alternating shoulder flexion/contralateral hip extension with core muscles braced) , Cuing for abdominal brace. X 10 each side plus time for instruction, rest, and transition. -Hooklying dead bug (supine with hips, knees, and shoulders flexed to 90 starting position then alternating hip/knee extension and contralateral shoulder flexion with abdominal activation). , x 10 each side.  - hooklying clamshell with blue theraband around knees, isometric holdon on stabilizing side while the gesture side moves, 2 x15 each side  Therapeutic activities: for functional strengthening and improved functional activity tolerance.Exercises performed as part of a circuit to allow pt to maximize efficiency and allow greater rest of muscle group between sets. -Side stepping with redtheraband wrapped around legs and held at umbilicus to activate core and hip stabilizers and abductors in functional weightbearing position. Cuing to activate abdominals. Stepping a few steps out and in as allowed by band2x20feet towards right, 2x15 feet left. Discontinued due to onset of R hip pain that subsided with rest. - SLS, 2kg ball toss,2x10 each side on firm surface. Gait belt with SBA for safety.  -Squats withbuttocks tap on 18" chairfocusing on glute activation and proper form with hip hinging, stabilized back, and tibial perpendicular to floor with knees behind toes. Green theraband wrapped around distal femurs for isometric hip abduction/ER force2x15 -  standing clamshell with no theraband and hand held support. Foot on wall to improve hip abduction/ER stabilization in functional position and improve balance.   HOME EXERCISE PROGRAM Access Code: [email or web address]  URL: [email or web address]  Date: 06/19/2018  Prepared by: Rosita Kea   Exercises   Seated Correct Posture   Prone Press Up - 10-15 reps - 1 second hold - 4x daily   Supine Hip Adduction Isometric with Ball - 10-15 reps - 5 second hold - 1 Sets - 1x daily - 7x weekly   Bridge with Hip Abduction and Resistance - 10-15 reps - 1 second hold - 3 Sets - 1x daily - 3x weekly   Supine Hip Flexion with Resistance Loop - 10-15 reps - 1 second hold - 3 Sets - 1x daily - 3x weekly   Squat with Chair Touch and Resistance Loop - 10-15  reps - 1 second hold - 3 Sets - 1x daily - 3x weekly   X Band Walk - 30 reps - 2 Sets - 1x daily - 3x weekly    Patient response to treatment:  Pt toleratedtreatmentwell overall. She fatigued quickly and had to stop due to hip pain with some exercises, but her pain resolved with rest. She arrivedwithno painand was able to completeall exercises and activitieswithoutlastingincreased pain or discomfort.. Minimal progressions today due to increased fatigue and concern for pain.Pt requiredmultimodalcuing for proper technique and to facilitate improved neuromuscular control, strength, range of motion, and functional ability with improvement noted with cuing.Sheis making progress towards her goals at this point.  PT Short Term Goals - 06/10/18 1730      PT SHORT TERM GOAL #1   Baseline  initial HEP provided initial eval; pt reports completing regularly 06/10/2018.    Time  2    Period  Weeks    Status  Achieved    Target Date  06/11/18        PT Long Term Goals - 05/28/18 1809      PT LONG TERM GOAL #1   Title  Improve Lower Extremity Functional Scale score equal or greater than 50/80 to demonstrate improved self reported  function.     Baseline  28/80 (05/28/2018);    Time  8    Period  Weeks    Target Date  06/05/18      PT LONG TERM GOAL #2   Title  Reduce pain with functional activities to equal or less than 3/10 to allow patient to complete usual activities including ADLs, IADLs, and social engagement with less difficulty.     Baseline  6/10 (05/28/2018);    Time  8    Period  Weeks    Status  New    Target Date  07/23/18      PT LONG TERM GOAL #4   Title  Pt will improve her neck FOTO score by at least 10 points as a demonstration of improved function.     Baseline  48 (05/28/2018);      PT LONG TERM GOAL #5   Title  Patient will be able to return to fitness program including walking for 30 min a day without limitation due to current condition.     Baseline  10-15 min walking comfortably (05/28/2018)    Time  8    Period  Weeks    Status  New    Target Date  07/23/18            Plan - 06/24/18 1853    Clinical Impression Statement  Pt toleratedtreatmentwell overall. She fatigued quickly and had to stop due to hip pain with some exercises, but her pain resolved with rest. She arrivedwithno painand was able to completeall exercises and activitieswithoutlastingincreased pain or discomfort.. Minimal progressions today due to increased fatigue and concern for pain.Pt requiredmultimodalcuing for proper technique and to facilitate improved neuromuscular control, strength, range of motion, and functional ability with improvement noted with cuing.Sheis making progress towards her goals at this point.    Rehab Potential  Good    Clinical Impairments Affecting Rehab Potential  (+) realistic expectations, motivation. (-) chronic nature of condition, multiple comorbidities that also affect function.     PT Frequency  2x / week    PT Duration  8 weeks    PT Treatment/Interventions  ADLs/Self Care Home Management;Aquatic Therapy;Biofeedback;Electrical Stimulation;Cryotherapy;Moist  Heat;Gait training;Stair training;Functional mobility training;Therapeutic activities;Therapeutic exercise;Balance  training;Neuromuscular re-education;Patient/family education;Manual techniques;Passive range of motion;Taping;Dry needling;Spinal Manipulations;Joint Manipulations;Other (comment)   joint mobilizations grades I-V   PT Next Visit Plan  continue graded functional, LE, and trunk exercises as tolerated.      PT Home Exercise Plan  Medbridge Access Code: [email or web address]     Consulted and Agree with Plan of Care  Patient       Patient will benefit from skilled therapeutic intervention in order to improve the following deficits and impairments:  Improper body mechanics, Pain, Cardiopulmonary status limiting activity, Decreased mobility, Hypermobility, Increased muscle spasms, Postural dysfunction, Decreased activity tolerance, Decreased endurance, Decreased strength, Impaired perceived functional ability, Difficulty walking, Obesity  Visit Diagnosis: Pain in right hip  Chronic right-sided low back pain, unspecified whether sciatica present  Muscle weakness (generalized)  Cervicalgia  Pain in left hip     Problem List Patient Active Problem List   Diagnosis Date Noted  . Sprain and strain of hip and thigh 05/10/2018  . Abnormal MRI, cervical spine 03/28/2018  . Menopause 11/26/2017  . Right upper quadrant pain 11/26/2017  . Abnormal Pap smear of cervix 07/27/2017  . Vasomotor flushing 07/19/2017  . Depression 07/18/2017  . Nausea 06/25/2017  . Hip pain, right 06/25/2017  . OSA (obstructive sleep apnea) 04/18/2017  . Annual physical exam 04/10/2017  . Fatigue 12/16/2016  . Chronic back pain 05/30/2016  . Abdominal pain 03/12/2016  . Anxiety and depression 12/28/2015  . History of obstructive sleep apnea 12/15/2015  . Thyroid nodule 12/15/2015  . GERD (gastroesophageal reflux disease) 12/15/2015  . Hyperlipidemia 12/15/2015  . Pain medication agreement signed 03/25/2015  .  CAD in native artery 02/05/2015  . Right supraspinatus tenosynovitis 08/17/2014  . Crohn's disease involving terminal ileum ([place]) 03/30/2014  . FH: colon polyps 03/30/2014  . 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 Other bursitis disorders 07/12/2012  . Pain in joint, pelvic region and thigh 07/12/2012  . Crohn's disease ([place]) 01/23/2012    Nancy Nordmann, PT, DPT 06/24/2018, 6:55 PM  [place] [facility] [address] [place], [address] Phone: [phone]   Fax:  [phone]  Name: Carolyn Esparza MRN: [id number] Date of Birth: 13-Sep-1965

## 2018-06-26 ENCOUNTER — Ambulatory Visit: Payer: Managed Care, Other (non HMO) | Admitting: Physical Therapy

## 2018-06-26 ENCOUNTER — Encounter: Payer: Self-pay | Admitting: Physical Therapy

## 2018-06-26 DIAGNOSIS — M545 Low back pain, unspecified: Secondary | ICD-10-CM

## 2018-06-26 DIAGNOSIS — M25552 Pain in left hip: Secondary | ICD-10-CM

## 2018-06-26 DIAGNOSIS — M542 Cervicalgia: Secondary | ICD-10-CM

## 2018-06-26 DIAGNOSIS — M6281 Muscle weakness (generalized): Secondary | ICD-10-CM

## 2018-06-26 DIAGNOSIS — G8929 Other chronic pain: Secondary | ICD-10-CM

## 2018-06-26 DIAGNOSIS — M25551 Pain in right hip: Secondary | ICD-10-CM

## 2018-06-26 NOTE — Telephone Encounter (Signed)
Called on 11/26 requesting P2P. Reviewer will call me on 11/27 between 8 am and noon.

## 2018-06-26 NOTE — Therapy (Signed)
[facility] [address], [place], [address] Phone: [phone]   Fax:  [phone]  Physical Therapy Treatment  Patient Details  Name: Carolyn Esparza MRN: [id number] Date of Birth: Feb 04, 1966 Referring Provider (PT): Poggi, Smith Mince, MD   Encounter Date: 06/26/2018  PT End of Session - 06/26/18 1657    Visit Number  9    Number of Visits  17    Date for PT Re-Evaluation  07/23/18    Authorization Type  [employer]    Authorization Time Period  Current cert period: 16/96/7893 - 07/23/2018 (last PN: IE 05/28/2018)    Authorization - Visit Number  9    Authorization - Number of Visits  10    PT Start Time  1615    PT Stop Time  1700    PT Time Calculation (min)  45 min    Activity Tolerance  Patient tolerated treatment well;Patient limited by fatigue    Behavior During Therapy  [facility] for tasks assessed/performed       Past Medical History:  Diagnosis Date  . Anxiety   . ASCUS with positive high risk HPV cervical   . CAD (coronary artery disease)   . Chicken pox   . Chronic pain    neck, back, b/l hips   . Depression   . GERD (gastroesophageal reflux disease)   . History of Crohn's disease   . Hyperlipidemia   . Left ovarian cyst    s/p removal of 1 ovary ? which one removed per pt   . Libido, decreased   . Skin cancer    reports skin cancer removed from skin in 2016 or 2017  . Thyroid nodule     Past Surgical History:  Procedure Laterality Date  . ABDOMINAL HYSTERECTOMY    . APPENDECTOMY  2004  . BLADDER SURGERY    . BREAST BIOPSY     x 2  . COLONOSCOPY    . OOPHORECTOMY Right    Unilateral Right (per MRI 04/17/2018)  . WISDOM TOOTH EXTRACTION      There were no vitals filed for this visit.  Subjective Assessment - 06/26/18 1618    Subjective  Patient reports she was very fatigued after last visit but felt better after sleeping more then next day. She had trouble sleeping the night after  her last treatment but she thinks it had to do with too much caffeine late in the day. She did not have any pain after last visit. She did not do her HEP since last visit because she was so tired.     Pertinent History  Patient is a 52 y.o. female who presents to outpatient physical therapy with a referral for strain of right hip, partial glute med tear in right hip. This patient's chief complaints consist of pain, activity intolerance, concern about injuring it further, leading to the following functional deficits: difficulty with walking, standing, bending, lifting, climbing stairs, reaching feet, participating in valued activities such as gardening, hiking, traveling, exercise and physical activity.    Limitations  Standing;Walking;House hold activities    How long can you sit comfortably?  1.5 hours    How long can you stand comfortably?  <6  min    How long can you walk comfortably?  10-15 min    Diagnostic tests  MRI, Radiographs (shows partial right glute med tear, see chart for more details).     Patient Stated Goals  Not to hurt. To  be able to not to be able to not worry about how long she is standing, not to worry about her hip hurting while she is sleeping, for her neck not to hurt when she looks around.     Currently in Pain?  No/denies        PT Education - 06/26/18 1657    Education Details  form, purpose of exercise    Person(s) Educated  Patient    Methods  Explanation;Demonstration;Tactile cues;Verbal cues    Comprehension  Verbalized understanding;Returned demonstration      TREATMENT: Denies any history of lumbar surgery Denies latex allergy  Therapeutic exercise:to centralize symptoms and improve ROM and strength required for successful completion of functional activities. -NuStep level6using bilateral upper and lower extremities. Seat setting9. For improved extremity mobility, muscular endurance, and activity tolerance; and to induce the analgesic effect of  aerobic exercise, stimulate improved joint nutrition, and prepare body structures and systems for following interventions. x5Minutes during subjective exam. -Quadruped bird dog (alternating shoulder flexion/contralateral hip extension with core muscles braced) ,Cuing for abdominal brace.2 X 10 each sideplus time for instruction, rest, and transition. -Hooklying dead bug (supine, knees, and shoulders flexed to 90 starting position then alternating hip/knee extension and contralateral shoulder flexion with abdominal activation).  2x 10 each side.  - hooklying clamshell with blue theraband around knees, isometric holdon on stabilizing side while the gesture side moves, 2 x15 each side  Therapeutic activities: for functional strengthening and improved functional activity tolerance.Exercises performed as part of a circuit to allow pt to maximize efficiency and allow greater rest of muscle group between sets. -Side stepping with redtheraband wrapped around legs and held at umbilicus to activate core and hip stabilizers and abductors in functional weightbearing position. Cuing to activate abdominals. Stepping a few steps out and in as allowed by band2x75feet each side - SLS,2kg ball toss,2x15 each side on firm surface. Gait belt with SBA for safety.  -Squats withbuttocks tap on 18" chairfocusing on glute activation and proper form with hip hinging, stabilized back, and tibial perpendicular to floor with knees behind toes. Green theraband wrapped around distal femurs for isometric hip abduction/ER force2x15 - standing clamshell with no theraband and hand held support. Foot on wall to improve hip abduction/ER stabilization in functional position and improve balance.   HOME EXERCISE PROGRAM Access Code: [email or web address]  URL: [email or web address]  Date: 06/19/2018  Prepared by: Rosita Kea   Exercises   Seated Correct Posture   Prone Press Up - 10-15 reps - 1 second hold -  4x daily   Supine Hip Adduction Isometric with Ball - 10-15 reps - 5 second hold - 1 Sets - 1x daily - 7x weekly   Bridge with Hip Abduction and Resistance - 10-15 reps - 1 second hold - 3 Sets - 1x daily - 3x weekly   Supine Hip Flexion with Resistance Loop - 10-15 reps - 1 second hold - 3 Sets - 1x daily - 3x weekly   Squat with Chair Touch and Resistance Loop - 10-15 reps - 1 second hold - 3 Sets - 1x daily - 3x weekly   X Band Walk - 30 reps - 2 Sets - 1x daily - 3x weekly   Patient response to treatment:  Pt toleratedtreatmentwell overall. She fatigued quickly but was able to complete all exercises, including progressions, without lasting increase in pain. She arrivedwithno painand was able to completeall exercises and activitieswithoutlastingincreased pain or discomfort. Minimal progressions today due to severe fatigue after last  treatment.Pt requiredmultimodalcuing for proper technique and to facilitate improved neuromuscular control, strength, range of motion, and functional ability with improvement noted with cuing.Sheis making progress towards her goals at this point.  PT Short Term Goals - 06/10/18 1730      PT SHORT TERM GOAL #1   Baseline  initial HEP provided initial eval; pt reports completing regularly 06/10/2018.    Time  2    Period  Weeks    Status  Achieved    Target Date  06/11/18        PT Long Term Goals - 05/28/18 1809      PT LONG TERM GOAL #1   Title  Improve Lower Extremity Functional Scale score equal or greater than 50/80 to demonstrate improved self reported function.     Baseline  28/80 (05/28/2018);    Time  8    Period  Weeks    Target Date  06/05/18      PT LONG TERM GOAL #2   Title  Reduce pain with functional activities to equal or less than 3/10 to allow patient to complete usual activities including ADLs, IADLs, and social engagement with less difficulty.     Baseline  6/10 (05/28/2018);    Time  8    Period  Weeks     Status  New    Target Date  07/23/18      PT LONG TERM GOAL #4   Title  Pt will improve her neck FOTO score by at least 10 points as a demonstration of improved function.     Baseline  48 (05/28/2018);      PT LONG TERM GOAL #5   Title  Patient will be able to return to fitness program including walking for 30 min a day without limitation due to current condition.     Baseline  10-15 min walking comfortably (05/28/2018)    Time  8    Period  Weeks    Status  New    Target Date  07/23/18            Plan - 06/26/18 1653    Clinical Impression Statement  Pt toleratedtreatmentwell overall. She fatigued quickly but was able to complete all exercises, including progressions, without lasting increase in pain. She arrivedwithno painand was able to completeall exercises and activitieswithoutlastingincreased pain or discomfort. Minimal progressions today due to severe fatigue after last treatment.Pt requiredmultimodalcuing for proper technique and to facilitate improved neuromuscular control, strength, range of motion, and functional ability with improvement noted with cuing.Sheis making progress towards her goals at this point.    Rehab Potential  Good    Clinical Impairments Affecting Rehab Potential  (+) realistic expectations, motivation. (-) chronic nature of condition, multiple comorbidities that also affect function.     PT Frequency  2x / week    PT Duration  8 weeks    PT Treatment/Interventions  ADLs/Self Care Home Management;Aquatic Therapy;Biofeedback;Electrical Stimulation;Cryotherapy;Moist Heat;Gait training;Stair training;Functional mobility training;Therapeutic activities;Therapeutic exercise;Balance training;Neuromuscular re-education;Patient/family education;Manual techniques;Passive range of motion;Taping;Dry needling;Spinal Manipulations;Joint Manipulations;Other (comment)   joint mobilizations grades I-V   PT Next Visit Plan  continue graded functional, LE, and  trunk exercises as tolerated.      PT Home Exercise Plan  Medbridge Access Code: [email or web address]     Consulted and Agree with Plan of Care  Patient       Patient will benefit from skilled therapeutic intervention in order to improve the following deficits and impairments:  Improper body mechanics, Pain, Cardiopulmonary status  limiting activity, Decreased mobility, Hypermobility, Increased muscle spasms, Postural dysfunction, Decreased activity tolerance, Decreased endurance, Decreased strength, Impaired perceived functional ability, Difficulty walking, Obesity  Visit Diagnosis: Pain in right hip  Chronic right-sided low back pain, unspecified whether sciatica present  Muscle weakness (generalized)  Cervicalgia  Pain in left hip     Problem List Patient Active Problem List   Diagnosis Date Noted  . Sprain and strain of hip and thigh 05/10/2018  . Abnormal MRI, cervical spine 03/28/2018  . Menopause 11/26/2017  . Right upper quadrant pain 11/26/2017  . Abnormal Pap smear of cervix 07/27/2017  . Vasomotor flushing 07/19/2017  . Depression 07/18/2017  . Nausea 06/25/2017  . Hip pain, right 06/25/2017  . OSA (obstructive sleep apnea) 04/18/2017  . Annual physical exam 04/10/2017  . Fatigue 12/16/2016  . Chronic back pain 05/30/2016  . Abdominal pain 03/12/2016  . Anxiety and depression 12/28/2015  . History of obstructive sleep apnea 12/15/2015  . Thyroid nodule 12/15/2015  . GERD (gastroesophageal reflux disease) 12/15/2015  . Hyperlipidemia 12/15/2015  . Pain medication agreement signed 03/25/2015  . CAD in native artery 02/05/2015  . Right supraspinatus tenosynovitis 08/17/2014  . Crohn's disease involving terminal ileum ([facility]) 03/30/2014  . FH: colon polyps 03/30/2014  . 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 Other bursitis disorders 07/12/2012  . Pain in joint, pelvic region and thigh 07/12/2012  . Crohn's disease ([place]) 01/23/2012    Nancy Nordmann, PT, DPT 06/26/2018,  4:58 PM  [facility] [address] [place], [address] Phone: [phone]   Fax:  [phone]  Name: Carolyn Esparza MRN: [id number] Date of Birth: 10-Jun-1966

## 2018-07-03 ENCOUNTER — Telehealth: Payer: Self-pay | Admitting: Internal Medicine

## 2018-07-03 NOTE — Telephone Encounter (Signed)
Reviewed noted 07/03/18 online MD live visit seen sialolithaisis given augmention bid x 1 week rec f/u with PCP in 3 days Call to sch f/u   [place]

## 2018-07-04 ENCOUNTER — Encounter: Payer: Self-pay | Admitting: Internal Medicine

## 2018-07-07 ENCOUNTER — Encounter: Payer: Self-pay | Admitting: Internal Medicine

## 2018-07-08 ENCOUNTER — Other Ambulatory Visit: Payer: Self-pay | Admitting: Internal Medicine

## 2018-07-08 DIAGNOSIS — F32A Depression, unspecified: Secondary | ICD-10-CM

## 2018-07-08 DIAGNOSIS — F329 Major depressive disorder, single episode, unspecified: Secondary | ICD-10-CM

## 2018-07-08 MED ORDER — SERTRALINE HCL 50 MG PO TABS: 25.0000 mg | ORAL_TABLET | Freq: Every day | ORAL | 3 refills | Status: DC

## 2018-07-22 ENCOUNTER — Other Ambulatory Visit (HOSPITAL_COMMUNITY)
Admission: RE | Admit: 2018-07-22 | Discharge: 2018-07-22 | Disposition: A | Discharge status: Home / Self Care | Payer: Managed Care, Other (non HMO) | Source: Ambulatory Visit | Attending: Obstetrics & Gynecology | Admitting: Obstetrics & Gynecology

## 2018-07-22 ENCOUNTER — Encounter: Payer: Self-pay | Admitting: Obstetrics & Gynecology

## 2018-07-22 ENCOUNTER — Ambulatory Visit (INDEPENDENT_AMBULATORY_CARE_PROVIDER_SITE_OTHER): Payer: Managed Care, Other (non HMO) | Admitting: Obstetrics & Gynecology

## 2018-07-22 VITALS — BP 120/80 | Ht 63.0 in | Wt 169.0 lb

## 2018-07-22 DIAGNOSIS — Z87410 Personal history of cervical dysplasia: Secondary | ICD-10-CM | POA: Diagnosis not present

## 2018-07-22 DIAGNOSIS — Z01419 Encounter for gynecological examination (general) (routine) without abnormal findings: Secondary | ICD-10-CM

## 2018-07-22 DIAGNOSIS — Z1211 Encounter for screening for malignant neoplasm of colon: Secondary | ICD-10-CM

## 2018-07-22 MED ORDER — ESTRADIOL 0.05 MG/24HR TD PTWK: 0.0500 mg | MEDICATED_PATCH | TRANSDERMAL | 3 refills | Status: DC

## 2018-07-22 NOTE — Progress Notes (Signed)
HPI:      [name] is a 52 y.o. G1P0010 who LMP was in the past, she presents today for her annual examination.  [facility].  The patient has no complaints today. The patient is sexually active. Herlast pap: approximate date 2018 and was abnormal: ASCUS and last mammogram: approximate date 2018 and was normal.  The patient does perform self breast exams.  There is no notable family history of breast or ovarian cancer in her family. The patient is taking hormone replacement therapy. Patient denies post-menopausal vaginal bleeding.   The patient has regular exercise: yes. The patient denies current symptoms of depression.    GYN Hx: Last Colonoscopy:3 years ago. Normal.  Last DEXA: neevr ago.    PMHx: Past Medical History:  Diagnosis Date  . Anxiety   . ASCUS with positive high risk HPV cervical   . CAD (coronary artery disease)   . Chicken pox   . Chronic pain    neck, back, b/l hips   . Depression   . GERD (gastroesophageal reflux disease)   . History of Crohn's disease   . Hyperlipidemia   . Left ovarian cyst    s/p removal of 1 ovary ? which one removed per pt   . Libido, decreased   . Skin cancer    reports skin cancer removed from skin in 2016 or 2017  . Thyroid nodule    Past Surgical History:  Procedure Laterality Date  . ABDOMINAL HYSTERECTOMY    . APPENDECTOMY  2004  . BLADDER SURGERY    . BREAST BIOPSY     x 2  . COLONOSCOPY    . OOPHORECTOMY Right    Unilateral Right (per MRI 04/17/2018)  . WISDOM TOOTH EXTRACTION     Family History  Problem Relation Age of Onset  . Alcohol abuse Mother   . Hyperlipidemia Mother   . Heart disease Mother   . Hypertension Mother   . Diabetes Mother   . Crohn's disease Sister        1/2 sister  . Depression Sister   . Leukemia Paternal Grandmother   . Cancer Paternal Aunt        blood, type unknown   Social History   Tobacco Use  . Smoking status: Former Smoker    Types: Cigarettes    Last attempt to quit:  08/29/2000    Years since quitting: 17.9  . Smokeless tobacco: Never Used  Substance Use Topics  . Alcohol use: No    Alcohol/week: 0.0 standard drinks    Comment: social   . Drug use: No    Current Outpatient Medications:  .  Ascorbic Acid (VITAMIN C) 1000 MG tablet, Take 1,000 mg by mouth daily. , Disp: , Rfl:  .  aspirin EC 81 MG tablet, Take 81 mg by mouth daily., Disp: , Rfl:  .  atorvastatin (LIPITOR) 10 MG tablet, Take 1 tablet (10 mg total) by mouth daily. At night, Disp: 90 tablet, Rfl: 3 .  buPROPion (WELLBUTRIN XL) 300 MG 24 hr tablet, Take 1 tablet (300 mg total) by mouth daily., Disp: 90 tablet, Rfl: 3 .  celecoxib (CELEBREX) 200 MG capsule, Take 1 capsule (200 mg total) by mouth daily., Disp: 90 capsule, Rfl: 3 .  docusate sodium (DOK) 100 MG capsule, TAKE 1 CAPSULE(100 MG) BY MOUTH TWICE DAILY, Disp: , Rfl:  .  estradiol (CLIMARA - DOSED IN MG/24 HR) 0.05 mg/24hr patch, Place 1 patch (0.05 mg total) onto the skin  once a week., Disp: 12 patch, Rfl: 3 .  fentaNYL (DURAGESIC - DOSED MCG/HR) 25 MCG/HR patch, Place 25 mcg onto the skin every 3 (three) days. , Disp: , Rfl:  .  HYDROcodone-acetaminophen (NORCO) 10-325 MG tablet, Take 1 tablet by mouth every 6 (six) hours as needed. , Disp: , Rfl:  .  lidocaine (LIDODERM) 5 %, Place 2 patches onto the skin daily. , Disp: , Rfl:  .  Multiple Vitamin (MULTI-VITAMINS) TABS, Take 1 tablet by mouth daily. , Disp: , Rfl:  .  Omega-3 Fatty Acids (FISH OIL PO), Take 1 capsule by mouth daily. , Disp: , Rfl:  .  omeprazole (PRILOSEC) 20 MG capsule, Take 1 capsule (20 mg total) by mouth daily. In am before food 30 minutes, Disp: 90 capsule, Rfl: 3 .  phentermine (ADIPEX-P) 37.5 MG tablet, Take 1 tablet (37.5 mg total) by mouth daily before breakfast., Disp: 60 tablet, Rfl: 0 .  sertraline (ZOLOFT) 50 MG tablet, Take 0.5 tablets (25 mg total) by mouth daily with breakfast., Disp: 90 tablet, Rfl: 3 .  tiZANidine (ZANAFLEX) 4 MG capsule, Take 1  capsule (4 mg total) by mouth at bedtime as needed for muscle spasms., Disp: 30 capsule, Rfl: 0 .  Topiramate (TOPAMAX PO), Take 25 mg by mouth at bedtime. , Disp: , Rfl:  .  valACYclovir (VALTREX) 1000 MG tablet, , Disp: , Rfl: 0 Allergies: Propofol; Nsaids; and Pentasa [mesalamine]  Review of Systems  Constitutional: Negative for chills, fever and malaise/fatigue.  HENT: Negative for congestion, sinus pain and sore throat.   Eyes: Negative for blurred vision and pain.  Respiratory: Negative for cough and wheezing.   Cardiovascular: Negative for chest pain and leg swelling.  Gastrointestinal: Negative for abdominal pain, constipation, diarrhea, heartburn, nausea and vomiting.  Genitourinary: Negative for dysuria, frequency, hematuria and urgency.  Musculoskeletal: Negative for back pain, joint pain, myalgias and neck pain.  Skin: Negative for itching and rash.  Neurological: Negative for dizziness, tremors and weakness.  Endo/Heme/Allergies: Does not bruise/bleed easily.  Psychiatric/Behavioral: Negative for depression. The patient is not nervous/anxious and does not have insomnia.    Objective: BP 120/80   Ht 5\' 3"  (1.6 m)   Wt 169 lb (76.7 kg)   BMI 29.94 kg/m   Filed Weights   07/22/18 0939  Weight: 169 lb (76.7 kg)   Body mass index is 29.94 kg/m. Physical Exam Constitutional:      General: She is not in acute distress.    Appearance: She is well-developed.  Genitourinary:     Pelvic exam was performed with patient supine.     Vagina, cervix and rectum normal.     No lesions in the vagina.     No vaginal bleeding.     No right or left adnexal mass present.     Right adnexa not tender.     Left adnexa not tender.     Genitourinary Comments: Absent Uterus  HENT:     Head: Normocephalic and atraumatic. No laceration.     Right Ear: Hearing normal.     Left Ear: Hearing normal.     Mouth/Throat:     Pharynx: Uvula midline.  Eyes:     Pupils: Pupils are equal, round,  and reactive to light.  Neck:     Musculoskeletal: Normal range of motion and neck supple.     Thyroid: No thyromegaly.  Cardiovascular:     Rate and Rhythm: Normal rate and regular rhythm.  Heart sounds: No murmur. No friction rub. No gallop.   Pulmonary:     Effort: Pulmonary effort is normal. No respiratory distress.     Breath sounds: Normal breath sounds. No wheezing.  Chest:     Breasts:        Right: No mass, skin change or tenderness.        Left: No mass, skin change or tenderness.  Abdominal:     General: Bowel sounds are normal. There is no distension.     Palpations: Abdomen is soft.     Tenderness: There is no abdominal tenderness. There is no rebound.  Musculoskeletal: Normal range of motion.  Neurological:     Mental Status: She is alert and oriented to person, place, and time.     Cranial Nerves: No cranial nerve deficit.  Skin:    General: Skin is warm and dry.  Psychiatric:        Judgment: Judgment normal.  Vitals signs reviewed.     Assessment: Annual Exam 1. Women's annual routine gynecological examination   2. History of cervical dysplasia   3. Screen for colon cancer    Plan:            1.  Cervical Screening-  Pap smear done today  2. Breast screening- Exam annually and mammogram scheduled  3. Colonoscopy every 10 years, Hemoccult testing after age 71  4. Labs managed by PCP  5. Counseling for hormonal therapy: no change in therapy today.  Cont Climara 0.05 patch weekly as prefers over Clonidine and it does help w Vasomotor sx's     F/U  Return in about 1 year (around 07/23/2019) for Annual.  Barnett Applebaum, MD, [facility], [facility] 07/22/2018  10:01 AM

## 2018-07-22 NOTE — Patient Instructions (Addendum)
PAP every year Mammogram every year    Call [phone] to schedule at [facility] Colonoscopy every 10 years Labs yearly (with PCP)   

## 2018-07-29 LAB — CYTOLOGY - PAP: DIAGNOSIS: NEGATIVE

## 2018-08-01 ENCOUNTER — Ambulatory Visit
Admission: RE | Admit: 2018-08-01 | Discharge: 2018-08-01 | Disposition: A | Discharge status: Home / Self Care | Payer: Managed Care, Other (non HMO) | Source: Ambulatory Visit | Attending: Internal Medicine | Admitting: Internal Medicine

## 2018-08-01 ENCOUNTER — Other Ambulatory Visit: Payer: Self-pay | Admitting: Internal Medicine

## 2018-08-01 DIAGNOSIS — M542 Cervicalgia: Secondary | ICD-10-CM

## 2018-08-01 DIAGNOSIS — M25552 Pain in left hip: Secondary | ICD-10-CM

## 2018-08-01 DIAGNOSIS — Z1231 Encounter for screening mammogram for malignant neoplasm of breast: Secondary | ICD-10-CM

## 2018-08-01 DIAGNOSIS — M25551 Pain in right hip: Secondary | ICD-10-CM

## 2018-08-01 DIAGNOSIS — M545 Low back pain, unspecified: Secondary | ICD-10-CM

## 2018-08-01 MED ORDER — TIZANIDINE HCL 4 MG PO CAPS: 4.0000 mg | ORAL_CAPSULE | Freq: Every evening | ORAL | 2 refills | Status: DC | PRN

## 2018-08-20 ENCOUNTER — Ambulatory Visit (HOSPITAL_BASED_OUTPATIENT_CLINIC_OR_DEPARTMENT_OTHER): Payer: Managed Care, Other (non HMO) | Attending: Internal Medicine | Admitting: Internal Medicine

## 2018-08-20 ENCOUNTER — Telehealth: Payer: Self-pay | Admitting: Internal Medicine

## 2018-08-20 VITALS — Ht 63.0 in | Wt 163.0 lb

## 2018-08-20 DIAGNOSIS — G4710 Hypersomnia, unspecified: Secondary | ICD-10-CM

## 2018-08-20 DIAGNOSIS — G4733 Obstructive sleep apnea (adult) (pediatric): Secondary | ICD-10-CM | POA: Diagnosis not present

## 2018-08-20 DIAGNOSIS — G4730 Sleep apnea, unspecified: Secondary | ICD-10-CM

## 2018-08-20 NOTE — Telephone Encounter (Signed)
Lynnae Sandhoff from [facility] called and stated that patient is schedule to have CPAP Titration Study tonight to be followed by MSLT. Per Lynnae Sandhoff, this patient is on Wellbutrin XL 300 mg  1 tablet daily po, Celebrex 200 mg 1 capsule po daily and Zoloft 50 mg take 25 mg po daily with breakfast.  Normally patient are advised to stop or hold these medications 2 wks before study. Pt states that she wasn't advised to hold off on these medications so she has taken them.  Pt states that she can not be off these meds, pt states that she can't function.  Also, Lynnae Sandhoff wants to know on the CPAP Titration Study on 5-20 cm H20 on current pressure settings or Auto CPAP 5-12 cm H20. Mediuim Range pressure is 7 cm H20.     Please advise ASAP.  Thanks, Catha Gosselin

## 2018-08-20 NOTE — Telephone Encounter (Signed)
Carolyn Esparza with [facility] called and spoke with Dr. Ashby Dawes to clarify CPAP Titration Order.  Carolyn Esparza is aware that per Dr. Ashby Dawes that pt is to continue with her usual medications. Nothing else is needed at this time. [name]

## 2018-08-20 NOTE — Telephone Encounter (Signed)
Called and LMOVM for Carolyn Esparza to return my call to [phone]. Called and spoke with patient and advised that per Dr. Ashby Dawes, she can stay on her usual medications. Pt voiced understanding and will proceed with CPAP Titration along with MSLT. [name]

## 2018-08-20 NOTE — Telephone Encounter (Signed)
May take all usual medications.  CPAP Titration Study on 5-20 itration.

## 2018-08-21 ENCOUNTER — Ambulatory Visit (HOSPITAL_BASED_OUTPATIENT_CLINIC_OR_DEPARTMENT_OTHER): Payer: Managed Care, Other (non HMO) | Attending: Internal Medicine | Admitting: Internal Medicine

## 2018-08-21 DIAGNOSIS — G4710 Hypersomnia, unspecified: Secondary | ICD-10-CM | POA: Insufficient documentation

## 2018-08-21 DIAGNOSIS — G4733 Obstructive sleep apnea (adult) (pediatric): Secondary | ICD-10-CM | POA: Insufficient documentation

## 2018-08-31 DIAGNOSIS — G4733 Obstructive sleep apnea (adult) (pediatric): Secondary | ICD-10-CM

## 2018-08-31 NOTE — Procedures (Signed)
Patient Name: Carolyn Esparza, Cypert Date: 08/20/2018 Gender: Female D.O.B: [birth year] Age (years): 52 Referring Provider: Laverle Hobby Height (inches): 63 Interpreting Physician: Baird Lyons MD, ABSM Weight (lbs): 163 RPSGT: Rebekah [place] BMI: 29 MRN: [id number] Neck Size: 14.50  CLINICAL INFORMATION The patient is referred for a CPAP titration to treat sleep apnea.  Date of NPSG, Split Night or HST: [place] Study [facility] [place] 09/25/12  AHI 8.0/ hr, desaturation to 86.1%  SLEEP STUDY TECHNIQUE As per the AASM Manual for the Scoring of Sleep and Associated Events v2.3 (April 2016) with a hypopnea requiring 4% desaturations.  The channels recorded and monitored were frontal, central and occipital EEG, electrooculogram (EOG), submentalis EMG (chin), nasal and oral airflow, thoracic and abdominal wall motion, anterior tibialis EMG, snore microphone, electrocardiogram, and pulse oximetry. Continuous positive airway pressure (CPAP) was initiated at the beginning of the study and titrated to treat sleep-disordered breathing.  MEDICATIONS Medications self-administered by patient taken the night of the study : TIZANIDINE, TOPIRAMATE  TECHNICIAN COMMENTS Comments added by technician: None Comments added by scorer: N/A  RESPIRATORY PARAMETERS Optimal PAP Pressure (cm): 10 AHI at Optimal Pressure (/hr): 0.3 Overall Minimal O2 (%): 89.0 Supine % at Optimal Pressure (%): 59 Minimal O2 at Optimal Pressure (%): 93.0   SLEEP ARCHITECTURE The study was initiated at 9:54:25 PM and ended at 6:03:55 AM.  Sleep onset time was 1.0 minutes and the sleep efficiency was 90.6%%. The total sleep time was 443.5 minutes.  The patient spent 11.4%% of the night in stage N1 sleep, 55.2%% in stage N2 sleep, 4.2%% in stage N3 and 29.2% in REM.Stage REM latency was 237.5 minutes  Wake after sleep onset was 45.0. Alpha intrusion was absent. Supine sleep was 81.64%.  CARDIAC  DATA The 2 lead EKG demonstrated sinus rhythm. The mean heart rate was 57.4 beats per minute. Other EKG findings include: None.  LEG MOVEMENT DATA The total Periodic Limb Movements of Sleep (PLMS) were 0. The PLMS index was 0.0. A PLMS index of <15 is considered normal in adults.  IMPRESSIONS - The optimal PAP pressure was 10 cm of water. - Central sleep apnea was not noted during this titration (CAI = 0.1/h). - Mild oxygen desaturations were observed during this titration (min O2 = 89.0%). Mean sat 95.2%. - No snoring was audible during this study. - No cardiac abnormalities were observed during this study. - Clinically significant periodic limb movements were not noted during this study.   DIAGNOSIS - Obstructive Sleep Apnea (327.23 [G47.33 ICD-10])  RECOMMENDATIONS - Trial of CPAP therapy on 10 cm H2O or autopap 5-15. Other options would be based on clinical judgment. - Patient used a Small size Resmed Full Face Mask AirFit 20 for Her mask and heated humidification. - Be careful with alcohol, sedatives and other CNS depressants that may worsen sleep apnea and disrupt normal sleep architecture. - Sleep hygiene should be reviewed to assess factors that may improve sleep quality. - Weight management and regular exercise should be initiated or continued. - See report of MSLT following this study.  [Electronically signed] 08/31/2018 02:51 PM  Baird Lyons MD, [place], American Board of Sleep Medicine   NPI: [id number]                         [place] [place] [facility]  ELECTRONICALLY SIGNED ON:  08/31/2018, 2:46 PM [place] PH: [phone]   FX: [phone]  [place] [facility]

## 2018-08-31 NOTE — Procedures (Signed)
   Patient Name: Carolyn Esparza, Carolyn Esparza Date: 08/21/2018 Gender: Female D.O.B: 12-12-1965 Age (years): 52 Referring Provider: Laverle Hobby Height (inches): 81 Interpreting Physician: Baird Lyons MD, ABSM Weight (lbs): 163 RPSGT: Jacolyn Reedy BMI: 29 MRN: [id number] Neck Size: 14.50  CLINICAL INFORMATION Sleep Study Type: MSLT The patient was referred to the sleep center for evaluation of daytime sleepiness. Epworth Sleepiness Score: 15  SLEEP STUDY TECHNIQUE A Multiple Sleep Latency Test was performed after an overnight polysomnogram according to the AASM scoring manual v2.3 (April 2016) and clinical guidelines. Five nap opportunities occurred over the course of the test which followed an overnight polysomnogram. The channels recorded and monitored were frontal, central, and occipital electroencephalography (EEG), right and left electrooculogram (EOG), chin electromyography (EMG), and electrocardiogram (EKG).  MEDICATIONS Medications taken by the patient at 0900 : Celebrex, Wellbutrin, Zoloft, Prilosec . IMPRESSIONS - Total number of naps attempted: 5 . Total number of naps with sleep attained: 5 . The Mean Sleep Latency was 01:43 minutes.  - The patient appears to have pathologic sleepiness, evidenced by a short mean sleep latency (8 minutes or less) on this MSLT. - No sleep onset REMs were noted during this MSLT. - Note divergence from protocol: This patient took several medications capapble of affecting sleepiness and REM. The study was performed following a CPAP titration nocturnal study, and she wore CPAP 10 during the MSLT. Clinical interpretation is required.  DIAGNOSIS - Pathologic Sleepiness  RECOMMENDATIONS - Return to ordering physician for management.  [Electronically signed] 08/31/2018 03:09 PM  Baird Lyons MD, [place], American Board of Sleep Medicine   NPI: [id number]                          [place], [place] [facility]  ELECTRONICALLY SIGNED ON:  08/31/2018, 2:58 PM [place] PH: [phone]   FX: [phone] [place]

## 2018-09-15 ENCOUNTER — Encounter: Payer: Self-pay | Admitting: Obstetrics & Gynecology

## 2018-09-16 ENCOUNTER — Other Ambulatory Visit: Payer: Self-pay | Admitting: Obstetrics & Gynecology

## 2018-09-16 ENCOUNTER — Telehealth: Payer: Self-pay

## 2018-09-16 DIAGNOSIS — G4733 Obstructive sleep apnea (adult) (pediatric): Secondary | ICD-10-CM

## 2018-09-16 MED ORDER — CLIMARA 0.05 MG/24HR TD PTWK: 0.0500 mg | MEDICATED_PATCH | TRANSDERMAL | 12 refills | Status: DC

## 2018-09-16 NOTE — Telephone Encounter (Signed)
Spoke to patient regarding her sleep study. Per Dr. Ashby Dawes, we are changing pressure to Auto CPAP 8-15 cm H2O. Will send referral to sleep med to change.   Let patient know we would like to see back in a month to see how pressure change does. She is aware to call and scheduled apt.

## 2018-09-21 ENCOUNTER — Encounter: Payer: Self-pay | Admitting: Obstetrics & Gynecology

## 2018-09-23 ENCOUNTER — Encounter: Payer: Self-pay | Admitting: Obstetrics & Gynecology

## 2018-10-01 ENCOUNTER — Other Ambulatory Visit: Payer: Self-pay | Admitting: Obstetrics & Gynecology

## 2018-10-02 ENCOUNTER — Other Ambulatory Visit: Payer: Self-pay | Admitting: Obstetrics & Gynecology

## 2018-10-02 MED ORDER — ESTROGENS CONJUGATED 0.45 MG PO TABS: 0.4500 mg | ORAL_TABLET | Freq: Every day | ORAL | 11 refills | Status: DC

## 2018-10-02 NOTE — Progress Notes (Signed)
Due to insurance coverage, pt's options for ERT are Premarin or Femring.  She elects for Premarin.  Climara patch Rx cancelled and Premarin .45mg  daily Rx done.  Barnett Applebaum, MD, [facility], [facility] 10/02/2018  7:06 AM

## 2018-10-22 ENCOUNTER — Other Ambulatory Visit: Payer: Self-pay | Admitting: Internal Medicine

## 2018-10-22 DIAGNOSIS — M542 Cervicalgia: Secondary | ICD-10-CM

## 2018-10-22 DIAGNOSIS — M25552 Pain in left hip: Secondary | ICD-10-CM

## 2018-10-22 DIAGNOSIS — M545 Low back pain, unspecified: Secondary | ICD-10-CM

## 2018-10-22 DIAGNOSIS — M25551 Pain in right hip: Secondary | ICD-10-CM

## 2018-10-22 MED ORDER — TIZANIDINE HCL 4 MG PO CAPS: 4.0000 mg | ORAL_CAPSULE | Freq: Every evening | ORAL | 5 refills | Status: DC | PRN

## 2018-11-01 ENCOUNTER — Encounter: Payer: Self-pay | Admitting: Internal Medicine

## 2018-11-01 ENCOUNTER — Other Ambulatory Visit: Payer: Self-pay | Admitting: Internal Medicine

## 2018-11-01 DIAGNOSIS — R110 Nausea: Secondary | ICD-10-CM

## 2018-11-01 MED ORDER — PROMETHAZINE HCL 25 MG PO TABS: 25.0000 mg | ORAL_TABLET | Freq: Two times a day (BID) | ORAL | 2 refills | Status: DC | PRN

## 2018-11-27 ENCOUNTER — Ambulatory Visit (INDEPENDENT_AMBULATORY_CARE_PROVIDER_SITE_OTHER): Payer: Managed Care, Other (non HMO) | Admitting: Internal Medicine

## 2018-11-27 DIAGNOSIS — F329 Major depressive disorder, single episode, unspecified: Secondary | ICD-10-CM

## 2018-11-27 DIAGNOSIS — E663 Overweight: Secondary | ICD-10-CM

## 2018-11-27 DIAGNOSIS — Z8262 Family history of osteoporosis: Secondary | ICD-10-CM

## 2018-11-27 DIAGNOSIS — Z1159 Encounter for screening for other viral diseases: Secondary | ICD-10-CM

## 2018-11-27 DIAGNOSIS — G8929 Other chronic pain: Secondary | ICD-10-CM

## 2018-11-27 DIAGNOSIS — E2839 Other primary ovarian failure: Secondary | ICD-10-CM | POA: Diagnosis not present

## 2018-11-27 DIAGNOSIS — M8580 Other specified disorders of bone density and structure, unspecified site: Secondary | ICD-10-CM

## 2018-11-27 DIAGNOSIS — I2510 Atherosclerotic heart disease of native coronary artery without angina pectoris: Secondary | ICD-10-CM

## 2018-11-27 DIAGNOSIS — E669 Obesity, unspecified: Secondary | ICD-10-CM | POA: Insufficient documentation

## 2018-11-27 DIAGNOSIS — E785 Hyperlipidemia, unspecified: Secondary | ICD-10-CM

## 2018-11-27 DIAGNOSIS — M545 Low back pain, unspecified: Secondary | ICD-10-CM

## 2018-11-27 DIAGNOSIS — Z1329 Encounter for screening for other suspected endocrine disorder: Secondary | ICD-10-CM

## 2018-11-27 DIAGNOSIS — Z1389 Encounter for screening for other disorder: Secondary | ICD-10-CM

## 2018-11-27 DIAGNOSIS — F419 Anxiety disorder, unspecified: Secondary | ICD-10-CM

## 2018-11-27 DIAGNOSIS — Z0000 Encounter for general adult medical examination without abnormal findings: Secondary | ICD-10-CM

## 2018-11-27 DIAGNOSIS — F32A Depression, unspecified: Secondary | ICD-10-CM

## 2018-11-27 NOTE — Progress Notes (Addendum)
Virtual Visit via Video Note  I connected with Lashawna Poche   on 11/27/18 at  4:07 PM EDT by a video enabled telemedicine application and verified that I am speaking with the correct person using two identifiers.  Location patient: home Location provider:work Persons participating in the virtual visit: patient, provider  I discussed the limitations of evaluation and management by telemedicine and the availability of in person appointments. The patient expressed understanding and agreed to proceed.   HPI: 1. Chronic neck pain she stopped celebrex after PRP injections by ortho but had to get back on Celebrex due to neck pain returning taking 200 mg qd. She was off celebrex from 10/10/2018 to 11/20/2018 -she wants copy of MRI faxed to pain doctor at [facility] 2. Chronic pain(neck hips and back-she was started on topamax 25 by pain clinic qhs then bid then went back to 1 pill qhs due to it upsetting her stomach Getting PRP injections with ortho and good response  3. BP 122/79 and HR 74 11/27/18 per pt vitals today  4. Anxiety/depression stable she going on anniversary trip in 01/2019 [place] and may need medication for anxiety will let me know  5. Overweight she declines to take adipex and is walking 1-1.5 miles a day since working from home.    ROS: See pertinent positives and negatives per HPI.  Past Medical History:  Diagnosis Date  . Anxiety   . ASCUS with positive high risk HPV cervical   . CAD (coronary artery disease)   . Chicken pox   . Chronic pain    neck, back, b/l hips   . Depression   . GERD (gastroesophageal reflux disease)   . History of Crohn's disease   . Hyperlipidemia   . Left ovarian cyst    s/p removal of 1 ovary ? which one removed per pt   . Libido, decreased   . Skin cancer    reports skin cancer removed from skin in 2016 or 2017  . Thyroid nodule     Past Surgical History:  Procedure Laterality Date  . ABDOMINAL HYSTERECTOMY    . APPENDECTOMY  2004  . BLADDER  SURGERY    . BREAST BIOPSY     x 2  . COLONOSCOPY    . OOPHORECTOMY Right    Unilateral Right (per MRI 04/17/2018)  . WISDOM TOOTH EXTRACTION      Family History  Problem Relation Age of Onset  . Alcohol abuse Mother   . Hyperlipidemia Mother   . Heart disease Mother   . Hypertension Mother   . Diabetes Mother   . Crohn's disease Sister        1/2 sister  . Depression Sister   . Osteoporosis Sister   . Leukemia Paternal Grandmother   . Cancer Paternal Aunt        blood, type unknown  . Breast cancer Neg Hx     SOCIAL HX: married    Current Outpatient Medications:  .  Ascorbic Acid (VITAMIN C) 1000 MG tablet, Take 1,000 mg by mouth daily. , Disp: , Rfl:  .  aspirin EC 81 MG tablet, Take 81 mg by mouth daily., Disp: , Rfl:  .  atorvastatin (LIPITOR) 10 MG tablet, Take 1 tablet (10 mg total) by mouth daily. At night, Disp: 90 tablet, Rfl: 3 .  buPROPion (WELLBUTRIN XL) 300 MG 24 hr tablet, Take 1 tablet (300 mg total) by mouth daily., Disp: 90 tablet, Rfl: 3 .  celecoxib (CELEBREX) 200 MG  capsule, Take 1 capsule (200 mg total) by mouth daily., Disp: 90 capsule, Rfl: 3 .  docusate sodium (DOK) 100 MG capsule, TAKE 1 CAPSULE(100 MG) BY MOUTH TWICE DAILY, Disp: , Rfl:  .  estrogens, conjugated, (PREMARIN) 0.45 MG tablet, Take 1 tablet (0.45 mg total) by mouth daily for 30 days., Disp: 30 tablet, Rfl: 11 .  fentaNYL (DURAGESIC - DOSED MCG/HR) 25 MCG/HR patch, Place 25 mcg onto the skin every 3 (three) days. , Disp: , Rfl:  .  HYDROcodone-acetaminophen (NORCO) 10-325 MG tablet, Take 1 tablet by mouth every 6 (six) hours as needed. , Disp: , Rfl:  .  lidocaine (LIDODERM) 5 %, Place 2 patches onto the skin daily. , Disp: , Rfl:  .  Multiple Vitamin (MULTI-VITAMINS) TABS, Take 1 tablet by mouth daily. , Disp: , Rfl:  .  Omega-3 Fatty Acids (FISH OIL PO), Take 1 capsule by mouth daily. , Disp: , Rfl:  .  omeprazole (PRILOSEC) 20 MG capsule, Take 1 capsule (20 mg total) by mouth daily. In  am before food 30 minutes, Disp: 90 capsule, Rfl: 3 .  promethazine (PHENERGAN) 25 MG tablet, Take 1 tablet (25 mg total) by mouth 2 (two) times daily as needed for nausea or vomiting., Disp: 30 tablet, Rfl: 2 .  sertraline (ZOLOFT) 50 MG tablet, Take 0.5 tablets (25 mg total) by mouth daily with breakfast., Disp: 90 tablet, Rfl: 3 .  tiZANidine (ZANAFLEX) 4 MG capsule, Take 1 capsule (4 mg total) by mouth at bedtime as needed for muscle spasms., Disp: 30 capsule, Rfl: 5 .  Topiramate (TOPAMAX PO), Take 25 mg by mouth at bedtime. , Disp: , Rfl:  .  valACYclovir (VALTREX) 1000 MG tablet, , Disp: , Rfl: 0  EXAM:  VITALS per patient if applicable:  GENERAL: alert, oriented, appears well and in no acute distress  HEENT: atraumatic, conjunttiva clear, no obvious abnormalities on inspection of external nose and ears  NECK: normal movements of the head and neck  LUNGS: on inspection no signs of respiratory distress, breathing rate appears normal, no obvious gross SOB, gasping or wheezing  CV: no obvious cyanosis  MS: moves all visible extremities without noticeable abnormality  PSYCH/NEURO: pleasant and cooperative, no obvious depression or anxiety, speech and thought processing grossly intact  ASSESSMENT AND PLAN:  Discussed the following assessment and plan:  Osteopenia, unspecified location - Plan: DG Bone Density Estrogen deficiency - Plan: DG Bone Density [id number] - Plan: DG Bone Density  CAD in native artery hyperlipidemia, unspecified hyperlipidemia type -will do fasting labs [facility]   Anxiety and depression controlled on wellbutrin 300 and zoloft 25 mg qd   Chronic pain neck,back, hips-doing better f/u [facility] pain and ortho Dr. Roland Rack, neck pain flaring will f/u with [facility] pain for this   Overweight (BMI 25.0-29.9)-getting more exercise now and helping   HM sch fasting labs [facility] by 01/25/2019 or after   Flu shot utd hep B 3/3 vaccines had  shingrix had 2/2  utd  Tdap  Sp partial hysterectomy. H/o Pap ASCUS 07/19/17 f/u with Dr. Kenton Kingfisher pap 07/22/18 negative except yeast  Colonoscopy pt wants to hold as disc prev visits will keep encouraging given h/o Crohns per hx, FH polpys  Mammogram [facility] [place] 08/01/2018 negative  Dermatology saw recently Dr. Kellie Moor 07/2018 BMI overweight declines adipex for now  DEXA ordered for future  Former smoker quit in 2002   Thyroid nodule noted on problem list will disc with pt in future to  see if repeat US will be ordered   Saw pain clinic 05/10/18 f/u in 3 months lumbar RFA right then left, consult ortho, topamax 25 mg bid referral wt loss clinic, fentanyl 25 mcg x 3 months percocet 10-325 mg qd prn celebrex 200 mg qd,  Dr. Roland Rack saw 05/10/18 rec PT steroid inj and f/u in 2 months  Labs 03/07/19 A1C 5.5  Cr 1.05 GFR 61  TC 171, TG 155 H, HDL 44 LDL 96, BMI 31.0 waist 40.0 inches  BP 137/63 or 83 unable to see   covid 19 ab igg negative   Glucose 86   I discussed the assessment and treatment plan with the patient. The patient was provided an opportunity to ask questions and all were answered. The patient agreed with the plan and demonstrated an understanding of the instructions.   The patient was advised to call back or seek an in-person evaluation if the symptoms worsen or if the condition fails to improve as anticipated.  Time spent 25 minutes  Delorise Jackson, MD

## 2018-11-28 ENCOUNTER — Encounter: Payer: Self-pay | Admitting: Internal Medicine

## 2018-12-04 ENCOUNTER — Encounter: Payer: Self-pay | Admitting: Internal Medicine

## 2018-12-17 ENCOUNTER — Encounter: Payer: Self-pay | Admitting: Internal Medicine

## 2018-12-27 ENCOUNTER — Encounter: Payer: Self-pay | Admitting: Obstetrics & Gynecology

## 2019-01-05 ENCOUNTER — Encounter: Payer: Self-pay | Admitting: Internal Medicine

## 2019-01-06 ENCOUNTER — Other Ambulatory Visit: Payer: Self-pay | Admitting: Internal Medicine

## 2019-01-06 DIAGNOSIS — F419 Anxiety disorder, unspecified: Secondary | ICD-10-CM

## 2019-01-06 DIAGNOSIS — F329 Major depressive disorder, single episode, unspecified: Secondary | ICD-10-CM

## 2019-01-06 DIAGNOSIS — F32A Depression, unspecified: Secondary | ICD-10-CM

## 2019-01-06 MED ORDER — VORTIOXETINE HBR 5 MG PO TABS: 5.0000 mg | ORAL_TABLET | Freq: Every day | ORAL | 2 refills | Status: DC

## 2019-01-21 ENCOUNTER — Other Ambulatory Visit: Payer: Self-pay | Admitting: Obstetrics & Gynecology

## 2019-01-21 ENCOUNTER — Other Ambulatory Visit: Payer: Self-pay | Admitting: Internal Medicine

## 2019-01-21 MED ORDER — BUPROPION HCL ER (XL) 300 MG PO TB24: 300.0000 mg | ORAL_TABLET | Freq: Every day | ORAL | 3 refills | Status: DC

## 2019-01-23 ENCOUNTER — Encounter: Payer: Self-pay | Admitting: Obstetrics & Gynecology

## 2019-01-28 ENCOUNTER — Other Ambulatory Visit: Payer: Self-pay

## 2019-01-28 ENCOUNTER — Ambulatory Visit
Admission: RE | Admit: 2019-01-28 | Discharge: 2019-01-28 | Disposition: A | Discharge status: Home / Self Care | Payer: Managed Care, Other (non HMO) | Source: Ambulatory Visit | Attending: Internal Medicine | Admitting: Internal Medicine

## 2019-01-28 DIAGNOSIS — Z8262 Family history of osteoporosis: Secondary | ICD-10-CM

## 2019-01-28 DIAGNOSIS — E2839 Other primary ovarian failure: Secondary | ICD-10-CM

## 2019-01-28 DIAGNOSIS — M8580 Other specified disorders of bone density and structure, unspecified site: Secondary | ICD-10-CM | POA: Diagnosis not present

## 2019-02-06 ENCOUNTER — Encounter (INDEPENDENT_AMBULATORY_CARE_PROVIDER_SITE_OTHER): Payer: Managed Care, Other (non HMO) | Admitting: Internal Medicine

## 2019-02-06 DIAGNOSIS — K115 Sialolithiasis: Secondary | ICD-10-CM

## 2019-02-06 MED ORDER — AMOXICILLIN-POT CLAVULANATE 875-125 MG PO TABS: 1.0000 | ORAL_TABLET | Freq: Two times a day (BID) | ORAL | 0 refills | Status: DC

## 2019-02-06 NOTE — Telephone Encounter (Signed)
If this is a new issue we are charging for my chart messages   I will sent Augmentin to your pharmacy  You will need to follow up with Ear Nose and Throat or oral surgery. I just had a patient similar situation followed with oral surgery and they referred her to Ear nose and throat  Take care [place]  Hello Dr Aundra Dubin,  I have a knot just under my jaw on the left side.  This is the 3rd time this has happened.  The last time was Dec 2019.  I used [facility] and was prescribed an antibiotic for a blocked salvia gland.  Can you call in a antibiotic for me?  Thanks Carolyn Esparza  A/p  Salivary gland  Augmentin  ENT vs oral surgery rec pt to contact to f/u   Time spent 5-10 minutes agreeable to charge  [place]

## 2019-02-19 ENCOUNTER — Encounter: Payer: Self-pay | Admitting: Internal Medicine

## 2019-03-03 ENCOUNTER — Other Ambulatory Visit: Payer: Self-pay

## 2019-03-03 DIAGNOSIS — F329 Major depressive disorder, single episode, unspecified: Secondary | ICD-10-CM

## 2019-03-03 DIAGNOSIS — F32A Depression, unspecified: Secondary | ICD-10-CM

## 2019-03-03 DIAGNOSIS — F419 Anxiety disorder, unspecified: Secondary | ICD-10-CM

## 2019-03-03 MED ORDER — VORTIOXETINE HBR 5 MG PO TABS: 5.0000 mg | ORAL_TABLET | Freq: Every day | ORAL | 3 refills | Status: DC

## 2019-03-11 ENCOUNTER — Encounter: Payer: Self-pay | Admitting: Internal Medicine

## 2019-03-11 ENCOUNTER — Other Ambulatory Visit: Payer: Self-pay | Admitting: Internal Medicine

## 2019-03-11 DIAGNOSIS — K115 Sialolithiasis: Secondary | ICD-10-CM

## 2019-03-26 ENCOUNTER — Other Ambulatory Visit: Payer: Self-pay | Admitting: Internal Medicine

## 2019-03-26 DIAGNOSIS — M542 Cervicalgia: Secondary | ICD-10-CM

## 2019-03-26 DIAGNOSIS — M25551 Pain in right hip: Secondary | ICD-10-CM

## 2019-04-09 ENCOUNTER — Other Ambulatory Visit: Payer: Self-pay

## 2019-04-09 DIAGNOSIS — E785 Hyperlipidemia, unspecified: Secondary | ICD-10-CM

## 2019-04-09 MED ORDER — ATORVASTATIN CALCIUM 10 MG PO TABS: 10.0000 mg | ORAL_TABLET | Freq: Every day | ORAL | 3 refills | Status: DC

## 2019-04-23 ENCOUNTER — Other Ambulatory Visit: Payer: Self-pay | Admitting: Internal Medicine

## 2019-04-23 MED ORDER — OMEPRAZOLE 20 MG PO CPDR: 20.0000 mg | DELAYED_RELEASE_CAPSULE | Freq: Every day | ORAL | 3 refills | Status: DC

## 2019-04-28 ENCOUNTER — Other Ambulatory Visit (HOSPITAL_COMMUNITY): Payer: Self-pay | Admitting: Otolaryngology

## 2019-04-28 ENCOUNTER — Other Ambulatory Visit: Payer: Self-pay | Admitting: Otolaryngology

## 2019-04-28 DIAGNOSIS — R221 Localized swelling, mass and lump, neck: Secondary | ICD-10-CM

## 2019-05-06 ENCOUNTER — Other Ambulatory Visit: Payer: Self-pay

## 2019-05-06 ENCOUNTER — Other Ambulatory Visit: Payer: Self-pay | Admitting: Internal Medicine

## 2019-05-06 ENCOUNTER — Ambulatory Visit
Admission: RE | Admit: 2019-05-06 | Discharge: 2019-05-06 | Disposition: A | Discharge status: Home / Self Care | Payer: Managed Care, Other (non HMO) | Source: Ambulatory Visit | Attending: Otolaryngology | Admitting: Otolaryngology

## 2019-05-06 DIAGNOSIS — M542 Cervicalgia: Secondary | ICD-10-CM

## 2019-05-06 DIAGNOSIS — R221 Localized swelling, mass and lump, neck: Secondary | ICD-10-CM | POA: Diagnosis not present

## 2019-05-06 DIAGNOSIS — M25551 Pain in right hip: Secondary | ICD-10-CM

## 2019-05-06 MED ORDER — CELECOXIB 200 MG PO CAPS: 200.0000 mg | ORAL_CAPSULE | Freq: Every day | ORAL | 3 refills | Status: DC

## 2019-05-06 MED ORDER — IOHEXOL 300 MG/ML  SOLN
75.0000 mL | Freq: Once | INTRAMUSCULAR | Status: AC | PRN
  Administered 2019-05-06: 08:00:00 75 mL via INTRAVENOUS

## 2019-05-07 ENCOUNTER — Other Ambulatory Visit: Payer: Self-pay

## 2019-05-07 ENCOUNTER — Other Ambulatory Visit: Payer: Self-pay | Admitting: Student

## 2019-05-07 DIAGNOSIS — M7581 Other shoulder lesions, right shoulder: Secondary | ICD-10-CM

## 2019-05-07 DIAGNOSIS — G8929 Other chronic pain: Secondary | ICD-10-CM

## 2019-05-07 DIAGNOSIS — M7541 Impingement syndrome of right shoulder: Secondary | ICD-10-CM

## 2019-05-07 DIAGNOSIS — M7521 Bicipital tendinitis, right shoulder: Secondary | ICD-10-CM

## 2019-05-08 LAB — URINALYSIS, ROUTINE W REFLEX MICROSCOPIC
Bilirubin, UA: NEGATIVE
Glucose, UA: NEGATIVE
Ketones, UA: NEGATIVE
Leukocytes,UA: NEGATIVE
Nitrite, UA: NEGATIVE
Protein,UA: NEGATIVE
RBC, UA: NEGATIVE
Specific Gravity, UA: 1.009 (ref 1.005–1.030)
Urobilinogen, Ur: 0.2 mg/dL (ref 0.2–1.0)
pH, UA: 6.5 (ref 5.0–7.5)

## 2019-05-08 LAB — CBC WITH DIFFERENTIAL/PLATELET
Basophils Absolute: 0 10*3/uL (ref 0.0–0.2)
Basos: 0 %
EOS (ABSOLUTE): 0.2 10*3/uL (ref 0.0–0.4)
Eos: 3 %
Hematocrit: 40 % (ref 34.0–46.6)
Hemoglobin: 13.5 g/dL (ref 11.1–15.9)
Immature Grans (Abs): 0 10*3/uL (ref 0.0–0.1)
Immature Granulocytes: 0 %
Lymphocytes Absolute: 2 10*3/uL (ref 0.7–3.1)
Lymphs: 29 %
MCH: 30.5 pg (ref 26.6–33.0)
MCHC: 33.8 g/dL (ref 31.5–35.7)
MCV: 91 fL (ref 79–97)
Monocytes Absolute: 0.6 10*3/uL (ref 0.1–0.9)
Monocytes: 9 %
Neutrophils Absolute: 4 10*3/uL (ref 1.4–7.0)
Neutrophils: 59 %
Platelets: 316 10*3/uL (ref 150–450)
RBC: 4.42 x10E6/uL (ref 3.77–5.28)
RDW: 12.4 % (ref 11.7–15.4)
WBC: 6.9 10*3/uL (ref 3.4–10.8)

## 2019-05-08 LAB — COMPREHENSIVE METABOLIC PANEL
ALT: 19 IU/L (ref 0–32)
AST: 29 IU/L (ref 0–40)
Albumin/Globulin Ratio: 1.7 (ref 1.2–2.2)
Albumin: 4.6 g/dL (ref 3.8–4.9)
Alkaline Phosphatase: 76 IU/L (ref 39–117)
BUN/Creatinine Ratio: 22 (ref 9–23)
BUN: 19 mg/dL (ref 6–24)
Bilirubin Total: 0.3 mg/dL (ref 0.0–1.2)
CO2: 23 mmol/L (ref 20–29)
Calcium: 9.6 mg/dL (ref 8.7–10.2)
Chloride: 101 mmol/L (ref 96–106)
Creatinine, Ser: 0.87 mg/dL (ref 0.57–1.00)
GFR calc Af Amer: 88 mL/min/{1.73_m2} (ref >59)
GFR calc non Af Amer: 76 mL/min/{1.73_m2} (ref >59)
Globulin, Total: 2.7 g/dL (ref 1.5–4.5)
Glucose: 94 mg/dL (ref 65–99)
Potassium: 4 mmol/L (ref 3.5–5.2)
Sodium: 140 mmol/L (ref 134–144)
Total Protein: 7.3 g/dL (ref 6.0–8.5)

## 2019-05-08 LAB — LIPID PANEL
Chol/HDL Ratio: 3.6 ratio (ref 0.0–4.4)
Cholesterol, Total: 160 mg/dL (ref 100–199)
HDL: 44 mg/dL (ref >39)
LDL Chol Calc (NIH): 91 mg/dL (ref 0–99)
Triglycerides: 141 mg/dL (ref 0–149)
VLDL Cholesterol Cal: 25 mg/dL (ref 5–40)

## 2019-05-08 LAB — TSH: TSH: 3.15 u[IU]/mL (ref 0.450–4.500)

## 2019-05-08 LAB — MEASLES/MUMPS/RUBELLA IMMUNITY
MUMPS ABS, IGG: <9 AU/mL — ABNORMAL LOW (ref >10.9)
RUBEOLA AB, IGG: >300 AU/mL (ref >16.4)
Rubella Antibodies, IGG: 1.66 index (ref >0.99)

## 2019-05-08 LAB — T4, FREE: Free T4: 0.85 ng/dL (ref 0.82–1.77)

## 2019-05-08 LAB — HEPATITIS B SURFACE ANTIBODY, QUANTITATIVE: Hepatitis B Surf Ab Quant: 78.4 m[IU]/mL (ref >9.9)

## 2019-05-09 ENCOUNTER — Ambulatory Visit (INDEPENDENT_AMBULATORY_CARE_PROVIDER_SITE_OTHER): Payer: Managed Care, Other (non HMO) | Admitting: Internal Medicine

## 2019-05-09 ENCOUNTER — Encounter: Payer: Self-pay | Admitting: Internal Medicine

## 2019-05-09 ENCOUNTER — Other Ambulatory Visit: Payer: Self-pay

## 2019-05-09 VITALS — BP 126/82 | HR 69 | Temp 97.9°F | Ht 63.0 in | Wt 174.0 lb

## 2019-05-09 DIAGNOSIS — F419 Anxiety disorder, unspecified: Secondary | ICD-10-CM | POA: Diagnosis not present

## 2019-05-09 DIAGNOSIS — Z23 Encounter for immunization: Secondary | ICD-10-CM

## 2019-05-09 DIAGNOSIS — F329 Major depressive disorder, single episode, unspecified: Secondary | ICD-10-CM

## 2019-05-09 DIAGNOSIS — F32A Depression, unspecified: Secondary | ICD-10-CM

## 2019-05-09 DIAGNOSIS — M4802 Spinal stenosis, cervical region: Secondary | ICD-10-CM

## 2019-05-09 DIAGNOSIS — Z1231 Encounter for screening mammogram for malignant neoplasm of breast: Secondary | ICD-10-CM | POA: Diagnosis not present

## 2019-05-09 DIAGNOSIS — M47812 Spondylosis without myelopathy or radiculopathy, cervical region: Secondary | ICD-10-CM

## 2019-05-09 DIAGNOSIS — Z0000 Encounter for general adult medical examination without abnormal findings: Secondary | ICD-10-CM | POA: Diagnosis not present

## 2019-05-09 HISTORY — DX: Spinal stenosis, cervical region: M48.02

## 2019-05-09 MED ORDER — VENLAFAXINE HCL ER 37.5 MG PO CP24: 37.5000 mg | ORAL_CAPSULE | Freq: Every day | ORAL | 1 refills | Status: DC

## 2019-05-09 NOTE — Progress Notes (Signed)
Chief Complaint  Patient presents with  . Follow-up   Annual work form filled out for [employer]  1.  Chronic pain (low back and neck) C/o 5/10 neck pain abnormal CT findings cervical stenosis and severe arthropathy and MRI abnormal with similar changes has seen NS in [place] in the past who rec conservative tx and no surgery now and see f/u chronic pain clinic and on fentanyl 12 mcg patch she stopped norco all together  2. Anxiety and depression on trintellex 5 mg alt with 2.5 mg and still c/w wt gain and on wellbutrin 300 mg xl qd  3. Right shoulder pain pending MRI right shoulder via [facility] has trouble with ROM reaching behind back and lifting arm overhead  4. Salivary gland stones multiple times resolved 05/06/2019 and conservative management  5. Reviewed labs 05/07/2019   Review of Systems  Constitutional: Negative for weight loss.  HENT: Negative for hearing loss.   Eyes: Negative for blurred vision.  Respiratory: Negative for shortness of breath.   Cardiovascular: Negative for chest pain.  Gastrointestinal: Negative for abdominal pain.  Musculoskeletal: Positive for back pain and neck pain.  Skin: Negative for rash.  Neurological: Negative for headaches.  Psychiatric/Behavioral: Negative for depression. The patient is not nervous/anxious.    Past Medical History:  Diagnosis Date  . Anxiety   . ASCUS with positive high risk HPV cervical   . CAD (coronary artery disease)   . Chicken pox   . Chronic pain    neck, back, b/l hips   . Depression   . GERD (gastroesophageal reflux disease)   . History of Crohn's disease   . Hyperlipidemia   . Left ovarian cyst    s/p removal of 1 ovary ? which one removed per pt   . Libido, decreased   . Skin cancer    reports skin cancer removed from skin in 2016 or 2017  . Thyroid nodule    Past Surgical History:  Procedure Laterality Date  . ABDOMINAL HYSTERECTOMY    . APPENDECTOMY  2004  . BLADDER SURGERY    . BREAST BIOPSY     x 2  .  COLONOSCOPY    . OOPHORECTOMY Right    Unilateral Right (per MRI 04/17/2018)  . WISDOM TOOTH EXTRACTION     Family History  Problem Relation Age of Onset  . Alcohol abuse Mother   . Hyperlipidemia Mother   . Heart disease Mother   . Hypertension Mother   . Diabetes Mother   . Crohn's disease Sister        1/2 sister  . Depression Sister   . Osteoporosis Sister   . Leukemia Paternal Grandmother   . Cancer Paternal Aunt        blood, type unknown  . Breast cancer Neg Hx    Social History   Socioeconomic History  . Marital status: Married    Spouse name: douglas  . Number of children: 0  . Years of education: Not on file  . Highest education level: Associate degree: occupational, Hotel manager, or vocational program  Occupational History  . Occupation: Public relations account executive    Comment: full time  Social Needs  . Financial resource strain: Not hard at all  . Food insecurity    Worry: Never true    Inability: Never true  . Transportation needs    Medical: No    Non-medical: No  Tobacco Use  . Smoking status: Former Smoker    Types: Cigarettes  Quit date: 08/29/2000    Years since quitting: 18.7  . Smokeless tobacco: Never Used  Substance and Sexual Activity  . Alcohol use: No    Alcohol/week: 0.0 standard drinks    Comment: social   . Drug use: No  . Sexual activity: Not Currently  Lifestyle  . Physical activity    Days per week: 2 days    Minutes per session: 20 min  . Stress: Only a little  Relationships  . Social Herbalist on phone: Never    Gets together: Never    Attends religious service: Never    Active member of club or organization: No    Attends meetings of clubs or organizations: Never    Relationship status: Married  . Intimate partner violence    Fear of current or ex partner: No    Emotionally abused: No    Physically abused: No    Forced sexual activity: No  Other Topics Concern  . Not on file  Social History Narrative   Married     Works [employer]    Current Meds  Medication Sig  . Ascorbic Acid (VITAMIN C) 1000 MG tablet Take 1,000 mg by mouth daily.   Marland Kitchen aspirin EC 81 MG tablet Take 81 mg by mouth daily.  Marland Kitchen atorvastatin (LIPITOR) 10 MG tablet Take 1 tablet (10 mg total) by mouth daily. At night  . buPROPion (WELLBUTRIN XL) 300 MG 24 hr tablet Take 1 tablet (300 mg total) by mouth daily.  . celecoxib (CELEBREX) 200 MG capsule Take 1 capsule (200 mg total) by mouth daily.  Marland Kitchen estradiol (CLIMARA - DOSED IN MG/24 HR) 0.05 mg/24hr patch APPLY 1 PATCH(0.05 MG) EXTERNALLY TO THE SKIN 1 TIME A WEEK  . fentaNYL (DURAGESIC) 12 MCG/HR   . lidocaine (LIDODERM) 5 % Place 2 patches onto the skin daily.   . Multiple Vitamin (MULTI-VITAMINS) TABS Take 1 tablet by mouth daily.   . Omega-3 Fatty Acids (FISH OIL PO) Take 1 capsule by mouth daily.   Marland Kitchen omeprazole (PRILOSEC) 20 MG capsule Take 1 capsule (20 mg total) by mouth daily. In am before food 30 minutes  . promethazine (PHENERGAN) 25 MG tablet Take 1 tablet (25 mg total) by mouth 2 (two) times daily as needed for nausea or vomiting.  Marland Kitchen tiZANidine (ZANAFLEX) 4 MG capsule Take 1 capsule (4 mg total) by mouth at bedtime as needed for muscle spasms.  . Topiramate (TOPAMAX PO) Take 25 mg by mouth at bedtime.   . valACYclovir (VALTREX) 1000 MG tablet   . [DISCONTINUED] vortioxetine HBr (TRINTELLIX) 5 MG TABS tablet Take 1 tablet (5 mg total) by mouth daily.   Allergies  Allergen Reactions  . Propofol Palpitations    BP drops and EKG changes, bladder retention  . Nsaids Other (See Comments)    Flare of Crohn's.  . Pentasa [Mesalamine] Other (See Comments)    Chest pain.   Recent Results (from the past 2160 hour(s))  Comprehensive metabolic panel     Status: None   Collection Time: 05/07/19  7:48 AM  Result Value Ref Range   Glucose 94 65 - 99 mg/dL   BUN 19 6 - 24 mg/dL   Creatinine, Ser 0.87 0.57 - 1.00 mg/dL   GFR calc non Af Amer 76 >59 mL/min/1.73   GFR calc Af Amer 88  >59 mL/min/1.73   BUN/Creatinine Ratio 22 9 - 23   Sodium 140 134 - 144 mmol/L   Potassium 4.0  3.5 - 5.2 mmol/L   Chloride 101 96 - 106 mmol/L   CO2 23 20 - 29 mmol/L   Calcium 9.6 8.7 - 10.2 mg/dL   Total Protein 7.3 6.0 - 8.5 g/dL   Albumin 4.6 3.8 - 4.9 g/dL   Globulin, Total 2.7 1.5 - 4.5 g/dL   Albumin/Globulin Ratio 1.7 1.2 - 2.2   Bilirubin Total 0.3 0.0 - 1.2 mg/dL   Alkaline Phosphatase 76 39 - 117 IU/L   AST 29 0 - 40 IU/L   ALT 19 0 - 32 IU/L  CBC w/Diff     Status: None   Collection Time: 05/07/19  7:48 AM  Result Value Ref Range   WBC 6.9 3.4 - 10.8 x10E3/uL   RBC 4.42 3.77 - 5.28 x10E6/uL   Hemoglobin 13.5 11.1 - 15.9 g/dL   Hematocrit 40.0 34.0 - 46.6 %   MCV 91 79 - 97 fL   MCH 30.5 26.6 - 33.0 pg   MCHC 33.8 31.5 - 35.7 g/dL   RDW 12.4 11.7 - 15.4 %   Platelets 316 150 - 450 x10E3/uL   Neutrophils 59 Not Estab. %   Lymphs 29 Not Estab. %   Monocytes 9 Not Estab. %   Eos 3 Not Estab. %   Basos 0 Not Estab. %   Neutrophils Absolute 4.0 1.4 - 7.0 x10E3/uL   Lymphocytes Absolute 2.0 0.7 - 3.1 x10E3/uL   Monocytes Absolute 0.6 0.1 - 0.9 x10E3/uL   EOS (ABSOLUTE) 0.2 0.0 - 0.4 x10E3/uL   Basophils Absolute 0.0 0.0 - 0.2 x10E3/uL   Immature Granulocytes 0 Not Estab. %   Immature Grans (Abs) 0.0 0.0 - 0.1 x10E3/uL  TSH     Status: None   Collection Time: 05/07/19  7:48 AM  Result Value Ref Range   TSH 3.150 0.450 - 4.500 uIU/mL  T4, free     Status: None   Collection Time: 05/07/19  7:48 AM  Result Value Ref Range   Free T4 0.85 0.82 - 1.77 ng/dL  Urinalysis, Routine w reflex microscopic     Status: Abnormal   Collection Time: 05/07/19  7:48 AM  Result Value Ref Range   Specific Gravity, UA 1.009 1.005 - 1.030   pH, UA 6.5 5.0 - 7.5   Color, UA Yellow Yellow   Appearance Ur Cloudy (A) Clear   Leukocytes,UA Negative Negative   Protein,UA Negative Negative/Trace   Glucose, UA Negative Negative   Ketones, UA Negative Negative   RBC, UA Negative Negative    Bilirubin, UA Negative Negative   Urobilinogen, Ur 0.2 0.2 - 1.0 mg/dL   Nitrite, UA Negative Negative   Microscopic Examination Comment     Comment: Microscopic not indicated and not performed.  Lipid panel     Status: None   Collection Time: 05/07/19  7:48 AM  Result Value Ref Range   Cholesterol, Total 160 100 - 199 mg/dL   Triglycerides 141 0 - 149 mg/dL   HDL 44 >39 mg/dL   VLDL Cholesterol Cal 25 5 - 40 mg/dL   LDL Chol Calc (NIH) 91 0 - 99 mg/dL   Chol/HDL Ratio 3.6 0.0 - 4.4 ratio    Comment:                                   T. Chol/HDL Ratio  Men  Women                               1/2 Avg.Risk  3.4    3.3                                   Avg.Risk  5.0    4.4                                2X Avg.Risk  9.6    7.1                                3X Avg.Risk 23.4   11.0   Hepatitis B surface antibody,quantitative     Status: None   Collection Time: 05/07/19  7:48 AM  Result Value Ref Range   Hepatitis B Surf Ab Quant 78.4 Immunity>9.9 mIU/mL    Comment:   Status of Immunity                     Anti-HBs Level   ------------------                     -------------- Inconsistent with Immunity                   0.0 - 9.9 Consistent with Immunity                          >9.9   Measles/Mumps/Rubella Immunity     Status: Abnormal   Collection Time: 05/07/19  7:48 AM  Result Value Ref Range   Rubella Antibodies, IGG 1.66 Immune >0.99 index    Comment:                                 Non-immune       <0.90                                 Equivocal  0.90 - 0.99                                 Immune           >0.99    RUBEOLA AB, IGG >300.0 Immune >16.4 AU/mL    Comment:                                  Negative        <13.5                                  Equivocal 13.5 - 16.4                                  Positive        >16.4 Presence of antibodies to Rubeola is presumptive  evidence of immunity except when acute infection  is suspected.    MUMPS ABS, IGG <9.0 (L) Immune >10.9 AU/mL    Comment:                                 Negative         <9.0                                 Equivocal  9.0 - 10.9                                 Positive        >10.9 A positive result generally indicates past exposure to Mumps virus or previous vaccination.    Objective  Body mass index is 30.82 kg/m. Wt Readings from Last 3 Encounters:  05/09/19 174 lb (78.9 kg)  08/21/18 163 lb (73.9 kg)  08/20/18 163 lb (73.9 kg)   Temp Readings from Last 3 Encounters:  05/09/19 97.9 F (36.6 C) (Oral)  05/30/18 98.3 F (36.8 C) (Oral)  03/28/18 98.1 F (36.7 C) (Oral)   BP Readings from Last 3 Encounters:  05/09/19 126/82  07/22/18 120/80  06/04/18 100/70   Pulse Readings from Last 3 Encounters:  05/09/19 69  06/04/18 80  05/30/18 78    Physical Exam Vitals signs and nursing note reviewed.  Constitutional:      Appearance: Normal appearance. She is well-developed and well-groomed.     Comments: +mask on    HENT:     Head: Normocephalic and atraumatic.  Eyes:     Conjunctiva/sclera: Conjunctivae normal.     Pupils: Pupils are equal, round, and reactive to light.  Cardiovascular:     Rate and Rhythm: Normal rate and regular rhythm.     Heart sounds: Normal heart sounds. No murmur.  Pulmonary:     Effort: Pulmonary effort is normal.     Breath sounds: Normal breath sounds.  Skin:    General: Skin is warm and dry.  Neurological:     General: No focal deficit present.     Mental Status: She is alert and oriented to person, place, and time. Mental status is at baseline.     Gait: Gait normal.  Psychiatric:        Attention and Perception: Attention and perception normal.        Mood and Affect: Mood and affect normal.        Speech: Speech normal.        Behavior: Behavior normal. Behavior is cooperative.        Thought Content: Thought content normal.        Cognition and Memory: Cognition and memory  normal.        Judgment: Judgment normal.     Assessment  Plan  Annual physical exam Flu shot given today  hep B3/3 vaccines had immune shingrixhad2/2  utd Tdap rec MMR  Sp partial hysterectomy. H/o Pap ASCUS 07/19/17 f/u with Dr. Kenton Kingfisher pap 07/22/18 negative except yeast   Colonoscopy pt wants to hold as disc prev visits will keep encouraging given h/o Crohns per hx, FH polpys  -cant tolerate proprofol  -disc again today 05/09/2019 declines for now but wants Dr. Silverio Decamp [facility] in future   Mammogram [facility] [place]  08/01/2018 negative referred today   Dermatology saw recently Dr. Kellie Moor 07/2018 BMI obesity but declined in bmi and weight overall  - declines adipex for now  DEXA 01/28/19 normal  Former smoker quit in 2002   Thyroid nodule noted on problem list will disc with pt in future to see if repeat US will be ordered  CT scan 05/06/19 negative   Saw pain clinic 05/10/18 f/u in 3 months lumbar RFA right then left, consult ortho, topamax 25 mg bid referral wt loss clinic, fentanyl 25 mcg x 3 months percocet 10-325 mg qd prn celebrex 200 mg qd,  Dr. Roland Rack saw 05/10/18 rec PT steroid inj and f/u in 2 months  Labs 03/07/19 A1C 5.5  Cr 1.05 GFR 61  TC 171, TG 155 H, HDL 44 LDL 96, BMI 31.0 waist 40.0 inches  BP 137/63 or 83 unable to see   covid 19 ab igg negative   Glucose 86   Anxiety and depression - Plan: venlafaxine XR (EFFEXOR XR) 37.5 MG 24 hr capsule  Do trintellix 5 mg qod x 1 week then stop   Chronic pain neck and low back, right shoulder  -f/u with pain clinic  Prn NS though [facility] does not rec surgery  -right shoulder MRI pending [facility] orth[facility]   Provider: Dr. Olivia Mackie [name]-Internal Medicine

## 2019-05-09 NOTE — Patient Instructions (Addendum)
The next 56 days online nutrition program   Taper off Trintellix do 5 mg every other day then stop   Either wt gain or loss  Venlafaxine tablets What is this medicine? VENLAFAXINE (VEN la fax een) is used to treat depression, anxiety and panic disorder. This medicine may be used for other purposes; ask your health care provider or pharmacist if you have questions. COMMON BRAND NAME(S): Effexor What should I tell my health care provider before I take this medicine? They need to know if you have any of these conditions:  bleeding disorders  glaucoma  heart disease  high blood pressure  high cholesterol  kidney disease  liver disease  low levels of sodium in the blood  mania or bipolar disorder  seizures  suicidal thoughts, plans, or attempt; a previous suicide attempt by you or a family  take medicines that treat or prevent blood clots  thyroid disease  an unusual or allergic reaction to venlafaxine, desvenlafaxine, other medicines, foods, dyes, or preservatives  pregnant or trying to get pregnant  breast-feeding How should I use this medicine? Take this medicine by mouth with a glass of water. Follow the directions on the prescription label. Take it with food. Take your medicine at regular intervals. Do not take your medicine more often than directed. Do not stop taking this medicine suddenly except upon the advice of your doctor. Stopping this medicine too quickly may cause serious side effects or your condition may worsen. A special MedGuide will be given to you by the pharmacist with each prescription and refill. Be sure to read this information carefully each time. Talk to your pediatrician regarding the use of this medicine in children. Special care may be needed. Overdosage: If you think you have taken too much of this medicine contact a poison control center or emergency room at once. NOTE: This medicine is only for you. Do not share this medicine with  others. What if I miss a dose? If you miss a dose, take it as soon as you can. If it is almost time for your next dose, take only that dose. Do not take double or extra doses. What may interact with this medicine? Do not take this medicine with any of the following medications:  certain medicines for fungal infections like fluconazole, itraconazole, ketoconazole, posaconazole, voriconazole  cisapride  desvenlafaxine  dronedarone  duloxetine  levomilnacipran  linezolid  MAOIs like Carbex, Eldepryl, Marplan, Nardil, and Parnate  methylene blue (injected into a vein)  milnacipran  pimozide  thioridazine This medicine may also interact with the following medications:  amphetamines  aspirin and aspirin-like medicines  certain medicines for depression, anxiety, or psychotic disturbances  certain medicines for migraine headaches like almotriptan, eletriptan, frovatriptan, naratriptan, rizatriptan, sumatriptan, zolmitriptan  certain medicines for sleep  certain medicines that treat or prevent blood clots like dalteparin, enoxaparin, warfarin  cimetidine  clozapine  diuretics  fentanyl  furazolidone  indinavir  isoniazid  lithium  metoprolol  NSAIDS, medicines for pain and inflammation, like ibuprofen or naproxen  other medicines that prolong the QT interval (cause an abnormal heart rhythm) like dofetilide, ziprasidone  procarbazine  rasagiline  supplements like St. John's wort, kava kava, valerian  tramadol  tryptophan This list may not describe all possible interactions. Give your health care provider a list of all the medicines, herbs, non-prescription drugs, or dietary supplements you use. Also tell them if you smoke, drink alcohol, or use illegal drugs. Some items may interact with your medicine. What should  I watch for while using this medicine? Tell your doctor if your symptoms do not get better or if they get worse. Visit your doctor or  health care professional for regular checks on your progress. Because it may take several weeks to see the full effects of this medicine, it is important to continue your treatment as prescribed by your doctor. Patients and their families should watch out for new or worsening thoughts of suicide or depression. Also watch out for sudden changes in feelings such as feeling anxious, agitated, panicky, irritable, hostile, aggressive, impulsive, severely restless, overly excited and hyperactive, or not being able to sleep. If this happens, especially at the beginning of treatment or after a change in dose, call your health care professional. This medicine can cause an increase in blood pressure. Check with your doctor for instructions on monitoring your blood pressure while taking this medicine. You may get drowsy or dizzy. Do not drive, use machinery, or do anything that needs mental alertness until you know how this medicine affects you. Do not stand or sit up quickly, especially if you are an older patient. This reduces the risk of dizzy or fainting spells. Alcohol may interfere with the effect of this medicine. Avoid alcoholic drinks. Your mouth may get dry. Chewing sugarless gum, sucking hard candy and drinking plenty of water will help. Contact your doctor if the problem does not go away or is severe. What side effects may I notice from receiving this medicine? Side effects that you should report to your doctor or health care professional as soon as possible:  allergic reactions like skin rash, itching or hives, swelling of the face, lips, or tongue  anxious  breathing problems  confusion  changes in vision  chest pain  confusion  elevated mood, decreased need for sleep, racing thoughts, impulsive behavior  eye pain  fast, irregular heartbeat  feeling faint or lightheaded, falls  feeling agitated, angry, or irritable  hallucination, loss of contact with reality  high blood  pressure  loss of balance or coordination  palpitations  redness, blistering, peeling or loosening of the skin, including inside the mouth  restlessness, pacing, inability to keep still  seizures  stiff muscles  suicidal thoughts or other mood changes  trouble passing urine or change in the amount of urine  trouble sleeping  unusual bleeding or bruising  unusually weak or tired  vomiting Side effects that usually do not require medical attention (report to your doctor or health care professional if they continue or are bothersome):  change in sex drive or performance  change in appetite or weight  constipation  dizziness  dry mouth  headache  increased sweating  nausea  tired This list may not describe all possible side effects. Call your doctor for medical advice about side effects. You may report side effects to FDA at 1-800-FDA-1088. Where should I keep my medicine? Keep out of the reach of children. Store at a controlled temperature between 20 and 25 degrees C (68 and 77 degrees F), in a dry place. Throw away any unused medicine after the expiration date. NOTE: This sheet is a summary. It may not cover all possible information. If you have questions about this medicine, talk to your doctor, pharmacist, or health care provider.  2020 Elsevier/Gold Standard (2018-07-09 12:08:23)   Serotonin Syndrome (side effects of fentanyl and effexor)  Serotonin is a chemical in your body (neurotransmitter) that helps to control several functions, such as:  Brain and nerve cell function.  Mood and emotions.  Memory.  Eating.  Sleeping.  Sexual activity.  Stress response. Having too much serotonin in your body can cause serotonin syndrome. This condition can be harmful to your brain and nerve cells. This can be a life-threatening condition. What are the causes? This condition may be caused by taking medicines or drugs that increase the level of serotonin in  your body, such as:  Antidepressant medicines.  Migraine medicines.  Certain pain medicines.  Certain drugs, including ecstasy, LSD, cocaine, and amphetamines.  Over-the-counter cough or cold medicines that contain dextromethorphan.  Certain herbal supplements, including St. John's wort, ginseng, and nutmeg. This condition usually occurs when you take these medicines or drugs in combination, but it can also happen with a high dose of a single medicine or drug. What increases the risk? You are more likely to develop this condition if:  You just started taking a medicine or drug that increases the level of serotonin in the body.  You recently increased the dose of a medicine or drug that increases the level of serotonin in the body.  You take more than one medicine or drug that increases the level of serotonin in the body. What are the signs or symptoms? Symptoms of this condition usually start within several hours of taking a medicine or drug. Symptoms may be mild or severe. Mild symptoms include:  Sweating.  Restlessness or agitation.  Muscle twitching or stiffness.  Rapid heart rate.  Nausea and vomiting.  Diarrhea.  Headache.  Shivering or goose bumps.  Confusion. Severe symptoms include:  Irregular heartbeat.  Seizures.  Loss of consciousness.  High fever. How is this diagnosed? This condition may be diagnosed based on:  Your medical history.  A physical exam.  Your prior use of drugs and medicines.  Blood or urine tests. These may be used to rule out other causes of your symptoms. How is this treated? The treatment for this condition depends on the severity of your symptoms.  For mild cases, stopping the medicine or drug that caused your condition is usually all that is needed.  For moderate to severe cases, treatment in a hospital may be needed to prevent or manage life-threatening symptoms. This may include medicines to control your symptoms,  IV fluids, interventions to support your breathing, and treatments to control your body temperature. Follow these instructions at home: Medicines   Take over-the-counter and prescription medicines only as told by your health care provider. This is important.  Check with your health care provider before you start taking any new prescriptions, over-the-counter medicines, herbs, or supplements.  Avoid combining any medicines that can cause this condition to occur. Lifestyle   Maintain a healthy lifestyle. ? Eat a healthy diet that includes plenty of vegetables, fruits, whole grains, low-fat dairy products, and lean protein. Do not eat a lot of foods that are high in fat, added sugars, or salt. ? Get the right amount and quality of sleep. Most adults need 7-9 hours of sleep each night. ? Make time to exercise, even if it is only for short periods of time. Most adults should exercise for at least 150 minutes each week. ? Do not drink alcohol. ? Do not use illegal drugs, and do not take medicines for reasons other than they are prescribed. General instructions  Do not use any products that contain nicotine or tobacco, such as cigarettes and e-cigarettes. If you need help quitting, ask your health care provider.  Keep all follow-up visits as told  by your health care provider. This is important. Contact a health care provider if:  Your symptoms do not improve or they get worse. Get help right away if you:  Have worsening confusion, severe headache, chest pain, high fever, seizures, or loss of consciousness.  Experience serious side effects of medicine, such as swelling of your face, lips, tongue, or throat.  Have serious thoughts about hurting yourself or others. These symptoms may represent a serious problem that is an emergency. Do not wait to see if the symptoms will go away. Get medical help right away. Call your local emergency services (911 in the [place] Do not drive yourself to the  hospital. If you ever feel like you may hurt yourself or others, or have thoughts about taking your own life, get help right away. You can go to your nearest emergency department or call:  Your local emergency services (911 in the [place]  A suicide crisis helpline, such as the [facility] at 443 083 7681. This is open 24 hours a day. Summary  Serotonin is a brain chemical that helps to regulate the nervous system. High levels of serotonin in the body can cause serotonin syndrome, which is a very dangerous condition.  This condition may be caused by taking medicines or drugs that increase the level of serotonin in your body.  Treatment depends on the severity of your symptoms. For mild cases, stopping the medicine or drug that caused your condition is usually all that is needed.  Check with your health care provider before you start taking any new prescriptions, over-the-counter medicines, herbs, or supplements. This information is not intended to replace advice given to you by your health care provider. Make sure you discuss any questions you have with your health care provider. Document Released: 08/24/2004 Document Revised: 08/24/2017 Document Reviewed: 08/24/2017 Elsevier Patient Education  2020 [employer].  Neck Exercises Ask your health care provider which exercises are safe for you. Do exercises exactly as told by your health care provider and adjust them as directed. It is normal to feel mild stretching, pulling, tightness, or discomfort as you do these exercises. Stop right away if you feel sudden pain or your pain gets worse. Do not begin these exercises until told by your health care provider. Neck exercises can be important for many reasons. They can improve strength and maintain flexibility in your neck, which will help your upper back and prevent neck pain. Stretching exercises Rotation neck stretching  1. Sit in a chair or stand up. 2. Place your  feet flat on the floor, shoulder width apart. 3. Slowly turn your head (rotate) to the right until a slight stretch is felt. Turn it all the way to the right so you can look over your right shoulder. Do not tilt or tip your head. 4. Hold this position for 10-30 seconds. 5. Slowly turn your head (rotate) to the left until a slight stretch is felt. Turn it all the way to the left so you can look over your left shoulder. Do not tilt or tip your head. 6. Hold this position for 10-30 seconds. Repeat __________ times. Complete this exercise __________ times a day. Neck retraction 1. Sit in a sturdy chair or stand up. 2. Look straight ahead. Do not bend your neck. 3. Use your fingers to push your chin backward (retraction). Do not bend your neck for this movement. Continue to face straight ahead. If you are doing the exercise properly, you will feel a slight sensation  in your throat and a stretch at the back of your neck. 4. Hold the stretch for 1-2 seconds. Repeat __________ times. Complete this exercise __________ times a day. Strengthening exercises Neck press 1. Lie on your back on a firm bed or on the floor with a pillow under your head. 2. Use your neck muscles to push your head down on the pillow and straighten your spine. 3. Hold the position as well as you can. Keep your head facing up (in a neutral position) and your chin tucked. 4. Slowly count to 5 while holding this position. Repeat __________ times. Complete this exercise __________ times a day. Isometrics These are exercises in which you strengthen the muscles in your neck while keeping your neck still (isometrics). 1. Sit in a supportive chair and place your hand on your forehead. 2. Keep your head and face facing straight ahead. Do not flex or extend your neck while doing isometrics. 3. Push forward with your head and neck while pushing back with your hand. Hold for 10 seconds. 4. Do the sequence again, this time putting your hand  against the back of your head. Use your head and neck to push backward against the hand pressure. 5. Finally, do the same exercise on either side of your head, pushing sideways against the pressure of your hand. Repeat __________ times. Complete this exercise __________ times a day. Prone head lifts 1. Lie face-down (prone position), resting on your elbows so that your chest and upper back are raised. 2. Start with your head facing downward, near your chest. Position your chin either on or near your chest. 3. Slowly lift your head upward. Lift until you are looking straight ahead. Then continue lifting your head as far back as you can comfortably stretch. 4. Hold your head up for 5 seconds. Then slowly lower it to your starting position. Repeat __________ times. Complete this exercise __________ times a day. Supine head lifts 1. Lie on your back (supine position), bending your knees to point to the ceiling and keeping your feet flat on the floor. 2. Lift your head slowly off the floor, raising your chin toward your chest. 3. Hold for 5 seconds. Repeat __________ times. Complete this exercise __________ times a day. Scapular retraction 1. Stand with your arms at your sides. Look straight ahead. 2. Slowly pull both shoulders (scapulae) backward and downward (retraction) until you feel a stretch between your shoulder blades in your upper back. 3. Hold for 10-30 seconds. 4. Relax and repeat. Repeat __________ times. Complete this exercise __________ times a day. Contact a health care provider if:  Your neck pain or discomfort gets much worse when you do an exercise.  Your neck pain or discomfort does not improve within 2 hours after you exercise. If you have any of these problems, stop exercising right away. Do not do the exercises again unless your health care provider says that you can. Get help right away if:  You develop sudden, severe neck pain. If this happens, stop exercising right  away. Do not do the exercises again unless your health care provider says that you can. This information is not intended to replace advice given to you by your health care provider. Make sure you discuss any questions you have with your health care provider. Document Released: 06/28/2015 Document Revised: 05/15/2018 Document Reviewed: 05/15/2018 Elsevier Patient Education  2020 [employer].

## 2019-05-17 ENCOUNTER — Ambulatory Visit: Payer: Managed Care, Other (non HMO)

## 2019-05-20 ENCOUNTER — Other Ambulatory Visit: Payer: Self-pay | Admitting: Internal Medicine

## 2019-05-20 DIAGNOSIS — M25551 Pain in right hip: Secondary | ICD-10-CM

## 2019-05-20 DIAGNOSIS — M545 Low back pain, unspecified: Secondary | ICD-10-CM

## 2019-05-20 DIAGNOSIS — M542 Cervicalgia: Secondary | ICD-10-CM

## 2019-05-20 MED ORDER — TIZANIDINE HCL 4 MG PO CAPS: 4.0000 mg | ORAL_CAPSULE | Freq: Every evening | ORAL | 5 refills | Status: DC | PRN

## 2019-05-28 ENCOUNTER — Other Ambulatory Visit: Payer: Self-pay

## 2019-05-28 ENCOUNTER — Ambulatory Visit
Admission: RE | Admit: 2019-05-28 | Discharge: 2019-05-28 | Disposition: A | Discharge status: Home / Self Care | Payer: Managed Care, Other (non HMO) | Source: Ambulatory Visit | Attending: Student | Admitting: Student

## 2019-05-28 DIAGNOSIS — G8929 Other chronic pain: Secondary | ICD-10-CM | POA: Diagnosis present

## 2019-05-28 DIAGNOSIS — M7521 Bicipital tendinitis, right shoulder: Secondary | ICD-10-CM | POA: Diagnosis present

## 2019-05-28 DIAGNOSIS — M25511 Pain in right shoulder: Secondary | ICD-10-CM | POA: Insufficient documentation

## 2019-05-28 DIAGNOSIS — M7581 Other shoulder lesions, right shoulder: Secondary | ICD-10-CM

## 2019-05-28 DIAGNOSIS — M7541 Impingement syndrome of right shoulder: Secondary | ICD-10-CM

## 2019-06-10 ENCOUNTER — Ambulatory Visit: Payer: Managed Care, Other (non HMO) | Admitting: Internal Medicine

## 2019-06-10 ENCOUNTER — Encounter: Payer: Self-pay | Admitting: Internal Medicine

## 2019-07-10 ENCOUNTER — Other Ambulatory Visit: Payer: Self-pay | Admitting: Internal Medicine

## 2019-07-10 DIAGNOSIS — F419 Anxiety disorder, unspecified: Secondary | ICD-10-CM

## 2019-07-10 DIAGNOSIS — F329 Major depressive disorder, single episode, unspecified: Secondary | ICD-10-CM

## 2019-07-10 DIAGNOSIS — F32A Depression, unspecified: Secondary | ICD-10-CM

## 2019-07-10 MED ORDER — VENLAFAXINE HCL ER 37.5 MG PO CP24: 37.5000 mg | ORAL_CAPSULE | Freq: Every day | ORAL | 3 refills | Status: DC

## 2019-07-31 ENCOUNTER — Other Ambulatory Visit: Payer: Self-pay

## 2019-07-31 ENCOUNTER — Encounter: Payer: Self-pay | Admitting: Obstetrics & Gynecology

## 2019-07-31 ENCOUNTER — Ambulatory Visit (INDEPENDENT_AMBULATORY_CARE_PROVIDER_SITE_OTHER): Payer: Managed Care, Other (non HMO) | Admitting: Obstetrics & Gynecology

## 2019-07-31 VITALS — BP 120/80 | Ht 63.0 in | Wt 184.0 lb

## 2019-07-31 DIAGNOSIS — Z01419 Encounter for gynecological examination (general) (routine) without abnormal findings: Secondary | ICD-10-CM | POA: Diagnosis not present

## 2019-07-31 DIAGNOSIS — R87610 Atypical squamous cells of undetermined significance on cytologic smear of cervix (ASC-US): Secondary | ICD-10-CM

## 2019-07-31 MED ORDER — ESTRADIOL 0.0375 MG/24HR TD PTWK: 0.0375 mg | MEDICATED_PATCH | TRANSDERMAL | 4 refills | Status: DC

## 2019-07-31 NOTE — Progress Notes (Signed)
HPI:      [name] is a 53 y.o. G1P0010 who LMP was in the past, she presents today for her annual examination.  The patient has no complaints today. The patient is sexually active. Herlast pap: approximate date 2019 and was normal and 2018 ASCUS and last mammogram: approximate date 2019 and was normal.  The patient does perform self breast exams.  There is no notable family history of breast or ovarian cancer in her family. The patient is taking hormone replacement therapy for HOT FLASHES (ERT PATCH works well). Patient denies post-menopausal vaginal bleeding.   The patient has regular exercise: yes. The patient denies current symptoms of depression.    GYN Hx: Last Colonoscopy:5 years ago. Normal.  Last DEXA: never ago.    PMHx: Past Medical History:  Diagnosis Date  . Anxiety   . ASCUS with positive high risk HPV cervical   . CAD (coronary artery disease)   . Chicken pox   . Chronic pain    neck, back, b/l hips   . Depression   . GERD (gastroesophageal reflux disease)   . History of Crohn's disease   . Hyperlipidemia   . Left ovarian cyst    s/p removal of 1 ovary ? which one removed per pt   . Libido, decreased   . Skin cancer    reports skin cancer removed from skin in 2016 or 2017  . Thyroid nodule    Past Surgical History:  Procedure Laterality Date  . ABDOMINAL HYSTERECTOMY    . APPENDECTOMY  2004  . BLADDER SURGERY    . BREAST BIOPSY     x 2  . COLONOSCOPY    . OOPHORECTOMY Right    Unilateral Right (per MRI 04/17/2018)  . WISDOM TOOTH EXTRACTION     Family History  Problem Relation Age of Onset  . Alcohol abuse Mother   . Hyperlipidemia Mother   . Heart disease Mother   . Hypertension Mother   . Diabetes Mother   . Crohn's disease Sister        1/2 sister  . Depression Sister   . Osteoporosis Sister   . Leukemia Paternal Grandmother   . Cancer Paternal Aunt        blood, type unknown  . Breast cancer Neg Hx    Social History   Tobacco Use   . Smoking status: Former Smoker    Types: Cigarettes    Quit date: 08/29/2000    Years since quitting: 18.9  . Smokeless tobacco: Never Used  Substance Use Topics  . Alcohol use: No    Alcohol/week: 0.0 standard drinks    Comment: social   . Drug use: No    Current Outpatient Medications:  .  Ascorbic Acid (VITAMIN C) 1000 MG tablet, Take 1,000 mg by mouth daily. , Disp: , Rfl:  .  aspirin EC 81 MG tablet, Take 81 mg by mouth daily., Disp: , Rfl:  .  atorvastatin (LIPITOR) 10 MG tablet, Take 1 tablet (10 mg total) by mouth daily. At night, Disp: 90 tablet, Rfl: 3 .  buPROPion (WELLBUTRIN XL) 300 MG 24 hr tablet, Take 1 tablet (300 mg total) by mouth daily., Disp: 90 tablet, Rfl: 3 .  celecoxib (CELEBREX) 200 MG capsule, Take 1 capsule (200 mg total) by mouth daily., Disp: 90 capsule, Rfl: 3 .  estradiol (CLIMARA - DOSED IN MG/24 HR) 0.0375 mg/24hr patch, Place 1 patch (0.0375 mg total) onto the skin once a week., Disp:  12 patch, Rfl: 4 .  fentaNYL (DURAGESIC) 12 MCG/HR, , Disp: , Rfl:  .  lidocaine (LIDODERM) 5 %, Place 2 patches onto the skin daily. , Disp: , Rfl:  .  Multiple Vitamin (MULTI-VITAMINS) TABS, Take 1 tablet by mouth daily. , Disp: , Rfl:  .  Omega-3 Fatty Acids (FISH OIL PO), Take 1 capsule by mouth daily. , Disp: , Rfl:  .  omeprazole (PRILOSEC) 20 MG capsule, Take 1 capsule (20 mg total) by mouth daily. In am before food 30 minutes, Disp: 90 capsule, Rfl: 3 .  promethazine (PHENERGAN) 25 MG tablet, Take 1 tablet (25 mg total) by mouth 2 (two) times daily as needed for nausea or vomiting., Disp: 30 tablet, Rfl: 2 .  tiZANidine (ZANAFLEX) 4 MG capsule, Take 1 capsule (4 mg total) by mouth at bedtime as needed for muscle spasms., Disp: 30 capsule, Rfl: 5 .  valACYclovir (VALTREX) 1000 MG tablet, , Disp: , Rfl: 0 .  venlafaxine XR (EFFEXOR XR) 37.5 MG 24 hr capsule, Take 1 capsule (37.5 mg total) by mouth daily with breakfast., Disp: 90 capsule, Rfl: 3 .  Topiramate (TOPAMAX  PO), Take 25 mg by mouth at bedtime. , Disp: , Rfl:  Allergies: Propofol, Nsaids, and Pentasa [mesalamine]  Review of Systems  Constitutional: Negative for chills, fever and malaise/fatigue.  HENT: Negative for congestion, sinus pain and sore throat.   Eyes: Negative for blurred vision and pain.  Respiratory: Negative for cough and wheezing.   Cardiovascular: Negative for chest pain and leg swelling.  Gastrointestinal: Negative for abdominal pain, constipation, diarrhea, heartburn, nausea and vomiting.  Genitourinary: Negative for dysuria, frequency, hematuria and urgency.  Musculoskeletal: Negative for back pain, joint pain, myalgias and neck pain.  Skin: Negative for itching and rash.  Neurological: Negative for dizziness, tremors and weakness.  Endo/Heme/Allergies: Does not bruise/bleed easily.  Psychiatric/Behavioral: Negative for depression. The patient is not nervous/anxious and does not have insomnia.     Objective: BP 120/80   Ht 5\' 3"  (1.6 m)   Wt 184 lb (83.5 kg)   BMI 32.59 kg/m   Filed Weights   07/31/19 0825  Weight: 184 lb (83.5 kg)   Body mass index is 32.59 kg/m. Physical Exam Constitutional:      General: She is not in acute distress.    Appearance: She is well-developed.  Genitourinary:     Pelvic exam was performed with patient supine.     Urethra, bladder, vagina and rectum normal.     No lesions in the vagina.     No vaginal bleeding.     No cervical motion tenderness, friability, lesion or polyp.     Uterus is absent.     No right or left adnexal mass present.     Right adnexa not tender.     Left adnexa not tender.     Genitourinary Comments: Normal cx Bladder no cystocele  HENT:     Head: Normocephalic and atraumatic. No laceration.     Right Ear: Hearing normal.     Left Ear: Hearing normal.     Mouth/Throat:     Pharynx: Uvula midline.  Eyes:     Pupils: Pupils are equal, round, and reactive to light.  Neck:     Thyroid: No thyromegaly.   Cardiovascular:     Rate and Rhythm: Normal rate and regular rhythm.     Heart sounds: No murmur. No friction rub. No gallop.   Pulmonary:     Effort: Pulmonary  effort is normal. No respiratory distress.     Breath sounds: Normal breath sounds. No wheezing.  Chest:     Breasts:        Right: No mass, skin change or tenderness.        Left: No mass, skin change or tenderness.  Abdominal:     General: Bowel sounds are normal. There is no distension.     Palpations: Abdomen is soft.     Tenderness: There is no abdominal tenderness. There is no rebound.  Musculoskeletal:        General: Normal range of motion.     Cervical back: Normal range of motion and neck supple.  Neurological:     Mental Status: She is alert and oriented to person, place, and time.     Cranial Nerves: No cranial nerve deficit.  Skin:    General: Skin is warm and dry.  Psychiatric:        Judgment: Judgment normal.  Vitals reviewed.     Assessment: Annual Exam 1. Women's annual routine gynecological examination   2. ASCUS of cervix with negative high risk HPV     Plan:            1.  Cervical Screening-  Pap smear done today  2. Breast screening- Exam annually and mammogram scheduled  3. Colonoscopy every 10 years, Hemoccult testing after age 75  4. Labs managed by PCP  5. Counseling for hormonal therapy: wants to change HRT or dose due to hot flashes; Will cahnge dose down one level              6. FRAX - FRAX score for assessing the 10 year probability for fracture calculated and discussed today.  Based on age and score today, DEXA is not currently scheduled.    F/U  Return in about 1 year (around 07/30/2020) for Annual.  Barnett Applebaum, MD, [facility], [facility] 07/31/2019  8:58 AM

## 2019-08-05 LAB — PAP IG (IMAGE GUIDED)

## 2019-08-11 ENCOUNTER — Ambulatory Visit
Admission: RE | Admit: 2019-08-11 | Discharge: 2019-08-11 | Disposition: A | Discharge status: Home / Self Care | Payer: Managed Care, Other (non HMO) | Source: Ambulatory Visit | Attending: Internal Medicine | Admitting: Internal Medicine

## 2019-08-11 ENCOUNTER — Other Ambulatory Visit: Payer: Self-pay

## 2019-08-11 DIAGNOSIS — Z1231 Encounter for screening mammogram for malignant neoplasm of breast: Secondary | ICD-10-CM

## 2019-08-13 ENCOUNTER — Other Ambulatory Visit: Payer: Self-pay | Admitting: Internal Medicine

## 2019-08-13 DIAGNOSIS — R928 Other abnormal and inconclusive findings on diagnostic imaging of breast: Secondary | ICD-10-CM

## 2019-08-21 ENCOUNTER — Other Ambulatory Visit: Payer: Self-pay

## 2019-08-21 ENCOUNTER — Ambulatory Visit
Admission: RE | Admit: 2019-08-21 | Discharge: 2019-08-21 | Disposition: A | Discharge status: Home / Self Care | Payer: Managed Care, Other (non HMO) | Source: Ambulatory Visit | Attending: Internal Medicine | Admitting: Internal Medicine

## 2019-08-21 ENCOUNTER — Ambulatory Visit: Payer: Managed Care, Other (non HMO)

## 2019-08-21 DIAGNOSIS — R928 Other abnormal and inconclusive findings on diagnostic imaging of breast: Secondary | ICD-10-CM

## 2019-09-09 ENCOUNTER — Encounter: Payer: Self-pay | Admitting: Internal Medicine

## 2019-09-09 ENCOUNTER — Ambulatory Visit (INDEPENDENT_AMBULATORY_CARE_PROVIDER_SITE_OTHER): Payer: Managed Care, Other (non HMO) | Admitting: Internal Medicine

## 2019-09-09 ENCOUNTER — Other Ambulatory Visit: Payer: Self-pay

## 2019-09-09 VITALS — BP 121/81 | Ht 63.0 in | Wt 182.0 lb

## 2019-09-09 DIAGNOSIS — R200 Anesthesia of skin: Secondary | ICD-10-CM | POA: Insufficient documentation

## 2019-09-09 DIAGNOSIS — M545 Low back pain, unspecified: Secondary | ICD-10-CM

## 2019-09-09 DIAGNOSIS — E669 Obesity, unspecified: Secondary | ICD-10-CM

## 2019-09-09 DIAGNOSIS — F329 Major depressive disorder, single episode, unspecified: Secondary | ICD-10-CM

## 2019-09-09 DIAGNOSIS — G8929 Other chronic pain: Secondary | ICD-10-CM

## 2019-09-09 DIAGNOSIS — F32A Depression, unspecified: Secondary | ICD-10-CM

## 2019-09-09 DIAGNOSIS — F419 Anxiety disorder, unspecified: Secondary | ICD-10-CM

## 2019-09-09 NOTE — Progress Notes (Signed)
Virtual Visit via Video Note  I connected with Carolyn Esparza  on 09/09/19 at  3:50 PM EST by a video enabled telemedicine application and verified that I am speaking with the correct person using two identifiers.  Location patient: home Location provider:work or home office Persons participating in the virtual visit: patient, provider, pts husband Carolyn Esparza  I discussed the limitations of evaluation and management by telemedicine and the availability of in person appointments. The patient expressed understanding and agreed to proceed.   HPI: 1. 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 She may be interested in ortho surgery if less invasive and laparoscopic reviewed Dr. Rudene Christians, Dr. Alvan Dame, Dr. Maureen Ralphs and Dr. Amado Coe Duke may do this. C/o x 1 numbness from feet b/l to legs and up to hip and started at the top of her feet and would not stop and radiated to shins. This did not limit her adls, iadls. She did not have leave weakness, no recent vaccines. B12 normal 914 12/15/16 She has a h/o radiofrequency ablation to treat chronic back pain and f/u pain clinic [facility] Dr. Josephina Gip in 09/2019 [address]  [place], [place] [address]  [phone]  Carolyn Boatman, MD  [address]  [id number] [place]  [place], [place] [address]  [phone]  [phone] (Fax)     Reviewed NCS/EMG  2. Weight gain though exercising  C/w antidepressants causing wt gain   3. Chronic depression current stable normal when worse which presents with increased sleep, fatigue, low energy she is on wellbutrin qd 300 mg qd and on trintellix 2.5 mg every 3rd day as she is c/w weight gain. She feels like antidepressants keep her from crying   She has seen [facility] psych (Dr. Shea Evans and therapy) or [facility] in the past but they made her see psych and therapy which was costly.    Meds off due to c/w weight gain Currently off zoloft tied helped but caused weight gain. Tried cymbalta but did not like 2/2 weight gain, was on paxil and it worked but caused DDI. Was on trazadone in the past. Tried effexor 37.5 in 05/2019   ROS: See pertinent positives and negatives per HPI.  Past Medical History:  Diagnosis Date  . Anxiety   . ASCUS with positive high risk HPV cervical   . CAD (coronary artery disease)   . Chicken pox   . Chronic pain    neck, back, b/l hips   . Depression   . GERD (gastroesophageal reflux disease)   . History of Crohn's disease   . Hyperlipidemia   . Left ovarian cyst    s/p removal of 1 ovary ? which one removed per pt   . Libido, decreased   . Skin cancer    reports skin cancer removed from skin in 2016 or 2017  . Thyroid nodule     Past Surgical History:  Procedure Laterality Date  . ABDOMINAL HYSTERECTOMY    . APPENDECTOMY  2004  . BLADDER SURGERY    . BREAST BIOPSY     x 2  . COLONOSCOPY    . OOPHORECTOMY Right    Unilateral Right (per MRI 04/17/2018)  . WISDOM TOOTH EXTRACTION      Family History  Problem Relation Age of Onset  . Alcohol abuse Mother   . Hyperlipidemia Mother   . Heart disease Mother   .  Hypertension Mother   . Diabetes Mother   . Crohn's disease Sister        1/2 sister  . Depression Sister   . Osteoporosis Sister   . Leukemia Paternal Grandmother   . Cancer Paternal Aunt        blood, type unknown  . Breast cancer Neg Hx     SOCIAL HX: married to husband Carolyn Esparza   Current Outpatient Medications:  .  Ascorbic Acid (VITAMIN C) 1000 MG tablet, Take 1,000 mg by mouth daily. , Disp: , Rfl:  .  aspirin EC 81 MG tablet, Take 81 mg by mouth daily., Disp: , Rfl:  .  atorvastatin (LIPITOR) 10 MG tablet, Take 1 tablet (10 mg total) by mouth daily. At night, Disp: 90 tablet, Rfl: 3 .  buPROPion (WELLBUTRIN XL) 300 MG 24 hr tablet, Take 1 tablet (300 mg total) by mouth daily., Disp: 90 tablet, Rfl: 3 .   celecoxib (CELEBREX) 200 MG capsule, Take 1 capsule (200 mg total) by mouth daily., Disp: 90 capsule, Rfl: 3 .  estradiol (CLIMARA - DOSED IN MG/24 HR) 0.0375 mg/24hr patch, Place 1 patch (0.0375 mg total) onto the skin once a week., Disp: 12 patch, Rfl: 4 .  fentaNYL (DURAGESIC) 12 MCG/HR, , Disp: , Rfl:  .  lidocaine (LIDODERM) 5 %, Place 2 patches onto the skin daily. , Disp: , Rfl:  .  Multiple Vitamin (MULTI-VITAMINS) TABS, Take 1 tablet by mouth daily. , Disp: , Rfl:  .  Omega-3 Fatty Acids (FISH OIL PO), Take 1 capsule by mouth daily. , Disp: , Rfl:  .  omeprazole (PRILOSEC) 20 MG capsule, Take 1 capsule (20 mg total) by mouth daily. In am before food 30 minutes, Disp: 90 capsule, Rfl: 3 .  promethazine (PHENERGAN) 25 MG tablet, Take 1 tablet (25 mg total) by mouth 2 (two) times daily as needed for nausea or vomiting., Disp: 30 tablet, Rfl: 2 .  tiZANidine (ZANAFLEX) 4 MG capsule, Take 1 capsule (4 mg total) by mouth at bedtime as needed for muscle spasms., Disp: 30 capsule, Rfl: 5 .  TRINTELLIX 5 MG TABS tablet, Take 5 mg by mouth daily., Disp: , Rfl:  .  valACYclovir (VALTREX) 1000 MG tablet, , Disp: , Rfl: 0  EXAM:  VITALS per patient if applicable:  GENERAL: alert, oriented, appears well and in no acute distress  HEENT: atraumatic, conjunttiva clear, no obvious abnormalities on inspection of external nose and ears  NECK: normal movements of the head and neck  LUNGS: on inspection no signs of respiratory distress, breathing rate appears normal, no obvious gross SOB, gasping or wheezing  CV: no obvious cyanosis  MS: moves all visible extremities without noticeable abnormality  PSYCH/NEURO: pleasant and cooperative, no obvious depression or anxiety, speech and thought processing grossly intact  ASSESSMENT AND PLAN:  Discussed the following assessment and plan:  Anxiety and depression Disc consider therapy and psych in future pt c/w cost  Cont meds for now wellbutrin xl 300  mg qd and trintellix 2.5 Q3rd day per pt  She feels like antidepressants keep her from crying   She has seen [facility] psych (Dr. Shea Evans and therapy) in the past but they made her see psych and therapy which was costly.   Meds off due to c/w weight gain Currently off zoloft tied helped but caused weight gain. Tried cymbalta but did not like 2/2 weight gain, was on paxil and it worked but caused DDI. Was on trazadone in the past.  Tried effexor 37.5 in 05/2019   Other chronic pain (2/2 OA, spondylosis/DDD spine, tendonitis/bursitis hips and labral tear right hip with numbness in b/l legs  -disc with pain clinic if they can do NCS/EMG and pt will consider for future w/u   Obesity (BMI 30.0-34.9) -rec healthy diet and exercise   HM Flu shot utd 2020 hep B3/3 vaccineshadimmune shingrixhad2/2  Pt ? If will get covid 19 vaccine  utd Tdap rec MMR  Sp partial hysterectomy. H/o Pap ASCUS 07/19/17 f/u with Dr. Garnette Czech 07/22/18 negative except yeast Pap 07/13/19 normal except yeast  Colonoscopy pt prev wants to hold as disc prev visits will keep encouraging given h/o Crohns per hx, FH polpys  -cant tolerate proprofol  -disced 05/09/2019 declines for now but wants Dr. Silverio Decamp [facility] in future   [facility] [facility]    Dermatology saw recently Dr. Kellie Moor 08/2019 normal no bx's   DEXA 01/28/19 normal   Former smoker quit in 2002   Thyroid nodule noted on problem list she did have in the past but resolved after repeat thyroid US and CT scan 05/06/19 negative thyroid nodules   Outside review of records  Saw pain clinic 05/10/18 f/u in 3 months lumbar RFA right then left, consult ortho, topamax 25 mg bid referral wt loss clinic, fentanyl 25 mcg x 3 months percocet 10-325 mg qd prn celebrex 200 mg qd,  Dr. Roland Rack saw 05/10/18 rec PT steroid inj and f/u in 2 months  Labs 03/07/19 A1C 5.5  Cr 1.05 GFR 61  TC 171, TG 155 H, HDL 44  LDL 96, BMI 31.0 waist 40.0 inches  BP 137/63 or 83 unable to see   covid 19 ab igg negative   Glucose 86   -we discussed possible serious and likely etiologies, options for evaluation and workup, limitations of telemedicine visit vs in person visit, treatment, treatment risks and precautions. Pt prefers to treat via telemedicine empirically rather then risking or undertaking an in person visit at this moment. Patient agrees to seek prompt in person care if worsening, new symptoms arise, or if is not improving with treatment.   I discussed the assessment and treatment plan with the patient. The patient was provided an opportunity to ask questions and all were answered. The patient agreed with the plan and demonstrated an understanding of the instructions.   The patient was advised to call back or seek an in-person evaluation if the symptoms worsen or if the condition fails to improve as anticipated.  Time spent 20-29 minutes  Delorise Jackson, MD

## 2019-09-09 NOTE — Patient Instructions (Signed)
Consider EMG/NCS nerve conduction study will see if your pain clinic does this   Multivitamin daily   covid 19 action plan if ever + Vitamin C 1000 MG daily  Zinc 100 mg daily  Vitamin D 4000 IU daily  Quercetin 250-500 mg 2x per day  Tylenol  Elderberry  Cough drops  Mucinex DM green label  Warm ginger tea home and lemon Hydration with water  Rest  Pulse oximeter to monitor oxygen if <90 seek help asap   COVID-19 Vaccine Information can be found at: ShippingScam.co.uk For questions related to vaccine distribution or appointments, please email vaccine@[email or web address] .com or call [phone].    Ortho doctors disc Dr. Alvan Dame in [place] Dr. Maureen Ralphs in [place] Dr. Richarda Overlie at [facility]   I think Dr. Roland Rack may have been reference Dr. Rudene Christians locally not sure ask him if you would like to know    Neuropathic Pain Neuropathic pain is pain caused by damage to the nerves that are responsible for certain sensations in your body (sensory nerves). The pain can be caused by:  Damage to the sensory nerves that send signals to your spinal cord and brain (peripheral nervous system).  Damage to the sensory nerves in your brain or spinal cord (central nervous system). Neuropathic pain can make you more sensitive to pain. Even a minor sensation can feel very painful. This is usually a long-term condition that can be difficult to treat. The type of pain differs from person to person. It may:  Start suddenly (acute), or it may develop slowly and last for a long time (chronic).  Come and go as damaged nerves heal, or it may stay at the same level for years.  Cause emotional distress, loss of sleep, and a lower quality of life. What are the causes? The most common cause of this condition is diabetes. Many other diseases and conditions can also cause neuropathic pain. Causes of neuropathic pain can be classified as:  Toxic. This is caused by medicines and  chemicals. The most common cause of toxic neuropathic pain is damage from cancer treatments (chemotherapy).  Metabolic. This can be caused by: ? Diabetes. This is the most common disease that damages the nerves. ? Lack of vitamin B from long-term alcohol abuse.  Traumatic. Any injury that cuts, crushes, or stretches a nerve can cause damage and pain. A common example is feeling pain after losing an arm or leg (phantom limb pain).  Compression-related. If a sensory nerve gets trapped or compressed for a long period of time, the blood supply to the nerve can be cut off.  Vascular. Many blood vessel diseases can cause neuropathic pain by decreasing blood supply and oxygen to nerves.  Autoimmune. This type of pain results from diseases in which the body's defense system (immune system) mistakenly attacks sensory nerves. Examples of autoimmune diseases that can cause neuropathic pain include lupus and multiple sclerosis.  Infectious. Many types of viral infections can damage sensory nerves and cause pain. Shingles infection is a common cause of this type of pain.  Inherited. Neuropathic pain can be a symptom of many diseases that are passed down through families (genetic). What increases the risk? You are more likely to develop this condition if:  You have diabetes.  You smoke.  You drink too much alcohol.  You are taking certain medicines, including medicines that kill cancer cells (chemotherapy) or that treat immune system disorders. What are the signs or symptoms? The main symptom is pain. Neuropathic pain is often described as:  Burning.  Shock-like.  Stinging.  Hot or cold.  Itching. How is this diagnosed? No single test can diagnose neuropathic pain. It is diagnosed based on:  Physical exam and your symptoms. Your health care provider will ask you about your pain. You may be asked to use a pain scale to describe how bad your pain is.  Tests. These may be done to see if  you have a high sensitivity to pain and to help find the cause and location of any sensory nerve damage. They include: ? Nerve conduction studies to test how well nerve signals travel through your sensory nerves (electrodiagnostic testing). ? Stimulating your sensory nerves through electrodes on your skin and measuring the response in your spinal cord and brain (somatosensory evoked potential).  Imaging studies, such as: ? X-rays. ? CT scan. ? MRI. How is this treated? Treatment for neuropathic pain may change over time. You may need to try different treatment options or a combination of treatments. Some options include:  Treating the underlying cause of the neuropathy, such as diabetes, kidney disease, or vitamin deficiencies.  Stopping medicines that can cause neuropathy, such as chemotherapy.  Medicine to relieve pain. Medicines may include: ? Prescription or over-the-counter pain medicine. ? Anti-seizure medicine. ? Antidepressant medicines. ? Pain-relieving patches that are applied to painful areas of skin. ? A medicine to numb the area (local anesthetic), which can be injected as a nerve block.  Transcutaneous nerve stimulation. This uses electrical currents to block painful nerve signals. The treatment is painless.  Alternative treatments, such as: ? Acupuncture. ? Meditation. ? Massage. ? Physical therapy. ? Pain management programs. ? Counseling. Follow these instructions at home: Medicines   Take over-the-counter and prescription medicines only as told by your health care provider.  Do not drive or use heavy machinery while taking prescription pain medicine.  If you are taking prescription pain medicine, take actions to prevent or treat constipation. Your health care provider may recommend that you: ? Drink enough fluid to keep your urine pale yellow. ? Eat foods that are high in fiber, such as fresh fruits and vegetables, whole grains, and beans. ? Limit foods  that are high in fat and processed sugars, such as fried or sweet foods. ? Take an over-the-counter or prescription medicine for constipation. Lifestyle   Have a good support system at home.  Consider joining a chronic pain support group.  Do not use any products that contain nicotine or tobacco, such as cigarettes and e-cigarettes. If you need help quitting, ask your health care provider.  Do not drink alcohol. General instructions  Learn as much as you can about your condition.  Work closely with all your health care providers to find the treatment plan that works best for you.  Ask your health care provider what activities are safe for you.  Keep all follow-up visits as told by your health care provider. This is important. Contact a health care provider if:  Your pain treatments are not working.  You are having side effects from your medicines.  You are struggling with tiredness (fatigue), mood changes, depression, or anxiety. Summary  Neuropathic pain is pain caused by damage to the nerves that are responsible for certain sensations in your body (sensory nerves).  Neuropathic pain may come and go as damaged nerves heal, or it may stay at the same level for years.  Neuropathic pain is usually a long-term condition that can be difficult to treat. Consider joining a chronic pain support group.  This information is not intended to replace advice given to you by your health care provider. Make sure you discuss any questions you have with your health care provider. Document Revised: 11/07/2018 Document Reviewed: 08/03/2017 Elsevier Patient Education  [employer].

## 2019-10-15 NOTE — Progress Notes (Signed)
[name], Carolyn Glow, MD   Chief Complaint  Patient presents with  . Vaginal Discharge    itchiness and irritation, no odor x on/off since Christmas    HPI:      Carolyn Esparza is a 54 y.o. G1P0010 who LMP was No LMP recorded. Patient has had a hysterectomy., presents today for vaginal itch/irritation and d/c, intermittently for 3 months. Last pap 12/20 was normal but showed yeast, no meds to treat. Tried vagisil crm last night. No recent abx use. Did change soaps/detergents to help with sx. Using dryer sheets.  Not recently sex active.   Past Medical History:  Diagnosis Date  . Anxiety   . ASCUS with positive high risk HPV cervical   . CAD (coronary artery disease)   . Chicken pox   . Chronic pain    neck, back, b/l hips   . Depression   . GERD (gastroesophageal reflux disease)   . History of Crohn's disease   . Hyperlipidemia   . Left ovarian cyst    s/p removal of 1 ovary ? which one removed per pt   . Libido, decreased   . Skin cancer    reports skin cancer removed from skin in 2016 or 2017  . Thyroid nodule    repeat US resolved and Ct neck 05/2019 normal thyroid     Past Surgical History:  Procedure Laterality Date  . ABDOMINAL HYSTERECTOMY    . APPENDECTOMY  2004  . BLADDER SURGERY    . BREAST BIOPSY     x 2  . COLONOSCOPY    . OOPHORECTOMY Right    Unilateral Right (per MRI 04/17/2018)  . WISDOM TOOTH EXTRACTION      Family History  Problem Relation Age of Onset  . Alcohol abuse Mother   . Hyperlipidemia Mother   . Heart disease Mother   . Hypertension Mother   . Diabetes Mother   . Crohn's disease Sister        1/2 sister  . Depression Sister   . Osteoporosis Sister   . Leukemia Paternal Grandmother   . Cancer Paternal Aunt        blood, type unknown  . Breast cancer Neg Hx     Social History   Socioeconomic History  . Marital status: Married    Spouse name: douglas  . Number of children: 0  . Years of education: Not on file    . Highest education level: Associate degree: occupational, Hotel manager, or vocational program  Occupational History  . 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 Smoking status: Former Smoker    Types: Cigarettes    Quit date: 08/29/2000    Years since quitting: 19.1  . Smokeless tobacco: Never Used  Substance and Sexual Activity  . Alcohol use: No    Alcohol/week: 0.0 standard drinks    Comment: social   . Drug use: No  . Sexual activity: Not Currently    Birth control/protection: Surgical    Comment: Hysterectomy  Other Topics Concern  . Not on file  Social History Narrative   Married Carolyn Esparza   Works [employer]    Social Determinants of Radio broadcast assistant Strain:   . Difficulty of Paying Living Expenses:   Food Insecurity:   . Worried About Charity fundraiser in the Last Year:   . Arboriculturist in the Last Year:   Transportation Needs:   .  Lack of Transportation (Medical):   Marland Kitchen Lack of Transportation (Non-Medical):   Physical Activity:   . Days of Exercise per Week:   . Minutes of Exercise per Session:   Stress:   . Feeling of Stress :   Social Connections:   . Frequency of Communication with Friends and Family:   . Frequency of Social Gatherings with Friends and Family:   . Attends Religious Services:   . Active Member of Clubs or Organizations:   . Attends Archivist Meetings:   Marland Kitchen Marital Status:   Intimate Partner Violence:   . Fear of Current or Ex-Partner:   . Emotionally Abused:   Marland Kitchen Physically Abused:   . Sexually Abused:     Outpatient Medications Prior to Visit  Medication Sig Dispense Refill  . Ascorbic Acid (VITAMIN C) 1000 MG tablet Take 1,000 mg by mouth daily.     Marland Kitchen atorvastatin (LIPITOR) 10 MG tablet Take 1 tablet (10 mg total) by mouth daily. At night 90 tablet 3  . buPROPion (WELLBUTRIN XL) 300 MG 24 hr tablet Take 1 tablet (300 mg total) by mouth daily. 90 tablet 3  . celecoxib (CELEBREX) 200 MG capsule  Take 1 capsule (200 mg total) by mouth daily. 90 capsule 3  . estradiol (CLIMARA - DOSED IN MG/24 HR) 0.0375 mg/24hr patch Place 1 patch (0.0375 mg total) onto the skin once a week. 12 patch 4  . fentaNYL (DURAGESIC) 12 MCG/HR     . lidocaine (LIDODERM) 5 % Place 2 patches onto the skin daily.     . Multiple Vitamin (MULTI-VITAMINS) TABS Take 1 tablet by mouth daily.     . Omega-3 Fatty Acids (FISH OIL PO) Take 1 capsule by mouth daily.     Marland Kitchen omeprazole (PRILOSEC) 20 MG capsule Take 1 capsule (20 mg total) by mouth daily. In am before food 30 minutes 90 capsule 3  . promethazine (PHENERGAN) 25 MG tablet Take 1 tablet (25 mg total) by mouth 2 (two) times daily as needed for nausea or vomiting. 30 tablet 2  . tiZANidine (ZANAFLEX) 4 MG capsule Take 1 capsule (4 mg total) by mouth at bedtime as needed for muscle spasms. 30 capsule 5  . TRINTELLIX 5 MG TABS tablet Take 5 mg by mouth daily.    . valACYclovir (VALTREX) 1000 MG tablet   0  . aspirin EC 81 MG tablet Take 81 mg by mouth daily.     No facility-administered medications prior to visit.      ROS:  Review of Systems  Constitutional: Negative for fever.  Gastrointestinal: Negative for blood in stool, constipation, diarrhea, nausea and vomiting.  Genitourinary: Positive for vaginal discharge. Negative for dyspareunia, dysuria, flank pain, frequency, hematuria, urgency, vaginal bleeding and vaginal pain.  Musculoskeletal: Negative for back pain.  Skin: Negative for rash.   BREAST: No symptoms   OBJECTIVE:   Vitals:  BP 120/70   Ht 5\' 3"  (1.6 m)   Wt 180 lb (81.6 kg)   BMI 31.89 kg/m   Physical Exam Vitals reviewed.  Constitutional:      Appearance: She is well-developed.  Pulmonary:     Effort: Pulmonary effort is normal.  Genitourinary:    General: Normal vulva.     Pubic Area: No rash.      Labia:        Right: Rash present. No tenderness or lesion.        Left: Rash present. No tenderness or lesion.  Vagina:  Vaginal discharge present. No erythema or tenderness.     Uterus: Absent. Not enlarged and not tender.      Adnexa:        Right: No mass or tenderness.         Left: No mass or tenderness.         Comments: BILAT LABIA MAJORA WITH ERTYHEMA, RASH Musculoskeletal:        General: Normal range of motion.     Cervical back: Normal range of motion.  Skin:    General: Skin is warm and dry.  Neurological:     General: No focal deficit present.     Mental Status: She is alert and oriented to person, place, and time.  Psychiatric:        Mood and Affect: Mood normal.        Behavior: Behavior normal.        Thought Content: Thought content normal.        Judgment: Judgment normal.     Results: Results for orders placed or performed in visit on 10/16/19 (from the past 24 hour(s))  POCT Wet Prep with KOH     Status: Abnormal   Collection Time: 10/16/19 10:26 AM  Result Value Ref Range   Trichomonas, UA Negative    Clue Cells Wet Prep HPF POC neg    Epithelial Wet Prep HPF POC     Yeast Wet Prep HPF POC POS    Bacteria Wet Prep HPF POC     RBC Wet Prep HPF POC     WBC Wet Prep HPF POC     KOH Prep POC Negative Negative     Assessment/Plan: Candidal vaginitis - Plan: POCT Wet Prep with KOH, fluconazole (DIFLUCAN) 150 MG tablet, clotrimazole-betamethasone (LOTRISONE) cream; Pos sx and wet prep, pos ext exam. Rx diflucan and lotrisone crm due to severity/persistance of sx. Cold compresses, line dry underwear. F/u prn.   Meds ordered this encounter  Medications  . fluconazole (DIFLUCAN) 150 MG tablet    Sig: Take 1 tablet orally, then repeat in 3 days    Dispense:  2 tablet    Refill:  0    Order Specific Question:   Supervising Provider    Answer:   Gae Dry [id number]  . clotrimazole-betamethasone (LOTRISONE) cream    Sig: Apply externally BID prn sx up to 2 wks    Dispense:  15 g    Refill:  0    Order Specific Question:   Supervising Provider    Answer:   Gae Dry [id number]      Return if symptoms worsen or fail to improve.  Devlon Dosher B. Maleny Candy, PA-C 10/16/2019 10:28 AM

## 2019-10-16 ENCOUNTER — Ambulatory Visit (INDEPENDENT_AMBULATORY_CARE_PROVIDER_SITE_OTHER): Payer: Managed Care, Other (non HMO) | Admitting: Obstetrics and Gynecology

## 2019-10-16 ENCOUNTER — Other Ambulatory Visit: Payer: Self-pay

## 2019-10-16 ENCOUNTER — Encounter: Payer: Self-pay | Admitting: Obstetrics and Gynecology

## 2019-10-16 VITALS — BP 120/70 | Ht 63.0 in | Wt 180.0 lb

## 2019-10-16 DIAGNOSIS — B373 Candidiasis of vulva and vagina: Secondary | ICD-10-CM

## 2019-10-16 DIAGNOSIS — B3731 Acute candidiasis of vulva and vagina: Secondary | ICD-10-CM

## 2019-10-16 LAB — POCT WET PREP WITH KOH
Clue Cells Wet Prep HPF POC: NEGATIVE
KOH Prep POC: NEGATIVE
Trichomonas, UA: NEGATIVE
Yeast Wet Prep HPF POC: POSITIVE

## 2019-10-16 MED ORDER — CLOTRIMAZOLE-BETAMETHASONE 1-0.05 % EX CREA: TOPICAL_CREAM | CUTANEOUS | 0 refills | Status: DC

## 2019-10-16 MED ORDER — FLUCONAZOLE 150 MG PO TABS: ORAL_TABLET | ORAL | 0 refills | Status: DC

## 2019-10-16 NOTE — Patient Instructions (Signed)
I value your feedback and entrusting us with your care. If you get a [facility] patient survey, I would appreciate you taking the time to let us know about your experience today. Thank you!  As of July 10, 2019, your lab results will be released to your MyChart immediately, before I even have a chance to see them. Please give me time to review them and contact you if there are any abnormalities. Thank you for your patience.  

## 2019-10-27 ENCOUNTER — Encounter: Payer: Self-pay | Admitting: Internal Medicine

## 2019-10-29 ENCOUNTER — Other Ambulatory Visit: Payer: Self-pay | Admitting: Internal Medicine

## 2019-10-29 DIAGNOSIS — B009 Herpesviral infection, unspecified: Secondary | ICD-10-CM

## 2019-10-29 MED ORDER — VALACYCLOVIR HCL 1 G PO TABS: 1000.0000 mg | ORAL_TABLET | Freq: Two times a day (BID) | ORAL | 11 refills | Status: DC

## 2019-11-11 ENCOUNTER — Other Ambulatory Visit: Payer: Self-pay | Admitting: Internal Medicine

## 2019-11-11 DIAGNOSIS — M545 Low back pain, unspecified: Secondary | ICD-10-CM

## 2019-11-11 DIAGNOSIS — M25551 Pain in right hip: Secondary | ICD-10-CM

## 2019-11-11 DIAGNOSIS — M542 Cervicalgia: Secondary | ICD-10-CM

## 2019-11-11 MED ORDER — TIZANIDINE HCL 4 MG PO CAPS: 4.0000 mg | ORAL_CAPSULE | Freq: Every evening | ORAL | 5 refills | Status: DC | PRN

## 2020-01-24 ENCOUNTER — Encounter: Payer: Self-pay | Admitting: Internal Medicine

## 2020-01-26 ENCOUNTER — Telehealth: Payer: Self-pay | Admitting: Internal Medicine

## 2020-01-26 ENCOUNTER — Other Ambulatory Visit: Payer: Self-pay | Admitting: Internal Medicine

## 2020-01-26 MED ORDER — BUPROPION HCL ER (XL) 300 MG PO TB24: 300.0000 mg | ORAL_TABLET | Freq: Every day | ORAL | 3 refills | Status: DC

## 2020-01-26 NOTE — Telephone Encounter (Signed)
See 01/24/20 Patient message encounter. Routed to Dr Olivia Mackie [name] today and awaiting her review.

## 2020-01-26 NOTE — Telephone Encounter (Signed)
Okay for refill? Patient last seen 09/09/2019.   Pended for your approval or denial.

## 2020-01-26 NOTE — Telephone Encounter (Signed)
Patient is requesting a refill on her buPROPion (WELLBUTRIN XL) 300 MG 24 hr tablet

## 2020-03-30 ENCOUNTER — Other Ambulatory Visit: Payer: Self-pay

## 2020-03-30 DIAGNOSIS — E785 Hyperlipidemia, unspecified: Secondary | ICD-10-CM

## 2020-03-30 MED ORDER — ATORVASTATIN CALCIUM 10 MG PO TABS: 10.0000 mg | ORAL_TABLET | Freq: Every day | ORAL | 1 refills | Status: DC

## 2020-04-09 ENCOUNTER — Ambulatory Visit (INDEPENDENT_AMBULATORY_CARE_PROVIDER_SITE_OTHER): Payer: Managed Care, Other (non HMO) | Admitting: Internal Medicine

## 2020-04-09 ENCOUNTER — Ambulatory Visit (INDEPENDENT_AMBULATORY_CARE_PROVIDER_SITE_OTHER): Payer: Managed Care, Other (non HMO)

## 2020-04-09 ENCOUNTER — Other Ambulatory Visit: Payer: Self-pay

## 2020-04-09 ENCOUNTER — Encounter: Payer: Self-pay | Admitting: Internal Medicine

## 2020-04-09 VITALS — BP 118/72 | HR 74 | Temp 98.2°F | Ht 63.0 in | Wt 173.8 lb

## 2020-04-09 DIAGNOSIS — G629 Polyneuropathy, unspecified: Secondary | ICD-10-CM

## 2020-04-09 DIAGNOSIS — R208 Other disturbances of skin sensation: Secondary | ICD-10-CM

## 2020-04-09 DIAGNOSIS — Z0000 Encounter for general adult medical examination without abnormal findings: Secondary | ICD-10-CM | POA: Diagnosis not present

## 2020-04-09 DIAGNOSIS — B351 Tinea unguium: Secondary | ICD-10-CM

## 2020-04-09 DIAGNOSIS — Z1329 Encounter for screening for other suspected endocrine disorder: Secondary | ICD-10-CM

## 2020-04-09 DIAGNOSIS — M79671 Pain in right foot: Secondary | ICD-10-CM

## 2020-04-09 DIAGNOSIS — M79672 Pain in left foot: Secondary | ICD-10-CM

## 2020-04-09 DIAGNOSIS — Z23 Encounter for immunization: Secondary | ICD-10-CM | POA: Diagnosis not present

## 2020-04-09 DIAGNOSIS — N6019 Diffuse cystic mastopathy of unspecified breast: Secondary | ICD-10-CM

## 2020-04-09 DIAGNOSIS — E66811 Obesity, class 1: Secondary | ICD-10-CM

## 2020-04-09 DIAGNOSIS — M25552 Pain in left hip: Secondary | ICD-10-CM

## 2020-04-09 DIAGNOSIS — E669 Obesity, unspecified: Secondary | ICD-10-CM

## 2020-04-09 DIAGNOSIS — Z13220 Encounter for screening for lipoid disorders: Secondary | ICD-10-CM

## 2020-04-09 DIAGNOSIS — F329 Major depressive disorder, single episode, unspecified: Secondary | ICD-10-CM

## 2020-04-09 DIAGNOSIS — M25551 Pain in right hip: Secondary | ICD-10-CM

## 2020-04-09 DIAGNOSIS — G8929 Other chronic pain: Secondary | ICD-10-CM

## 2020-04-09 DIAGNOSIS — K219 Gastro-esophageal reflux disease without esophagitis: Secondary | ICD-10-CM

## 2020-04-09 DIAGNOSIS — E538 Deficiency of other specified B group vitamins: Secondary | ICD-10-CM

## 2020-04-09 DIAGNOSIS — M542 Cervicalgia: Secondary | ICD-10-CM

## 2020-04-09 DIAGNOSIS — M545 Low back pain, unspecified: Secondary | ICD-10-CM

## 2020-04-09 DIAGNOSIS — Z1231 Encounter for screening mammogram for malignant neoplasm of breast: Secondary | ICD-10-CM

## 2020-04-09 DIAGNOSIS — F32A Depression, unspecified: Secondary | ICD-10-CM

## 2020-04-09 DIAGNOSIS — F419 Anxiety disorder, unspecified: Secondary | ICD-10-CM

## 2020-04-09 DIAGNOSIS — Z1389 Encounter for screening for other disorder: Secondary | ICD-10-CM

## 2020-04-09 MED ORDER — CICLOPIROX 8 % EX SOLN: Freq: Every day | CUTANEOUS | 5 refills | Status: DC

## 2020-04-09 NOTE — Patient Instructions (Addendum)
Nerve conduction studies/EMG on lower legs -->Dr. Jeneen Rinks  Consider ortho, podiatry, GI in the future     Fungal Nail Infection A fungal nail infection is a common infection of the toenails or fingernails. This condition affects toenails more often than fingernails. It often affects the great, or big, toes. More than one nail may be infected. The condition can be passed from person to person (is contagious). What are the causes? This condition is caused by a fungus. Several types of fungi can cause the infection. These fungi are common in moist and warm areas. If your hands or feet come into contact with the fungus, it may get into a crack in your fingernail or toenail and cause the infection. What increases the risk? The following factors may make you more likely to develop this condition:  Being female.  Being of older age.  Living with someone who has the fungus.  Walking barefoot in areas where the fungus thrives, such as showers or locker rooms.  Wearing shoes and socks that cause your feet to sweat.  Having a nail injury or a recent nail surgery.  Having certain medical conditions, such as: ? Athlete's foot. ? Diabetes. ? Psoriasis. ? Poor circulation. ? A weak body defense system (immune system). What are the signs or symptoms? Symptoms of this condition include:  A pale spot on the nail.  Thickening of the nail.  A nail that becomes yellow or brown.  A brittle or ragged nail edge.  A crumbling nail.  A nail that has lifted away from the nail bed. How is this diagnosed? This condition is diagnosed with a physical exam. Your health care provider may take a scraping or clipping from your nail to test for the fungus. How is this treated? Treatment is not needed for mild infections. If you have significant nail changes, treatment may include:  Antifungal medicines taken by mouth (orally). You may need to take the medicine for several weeks or several months, and you  may not see the results for a long time. These medicines can cause side effects. Ask your health care provider what problems to watch for.  Antifungal nail polish or nail cream. These may be used along with oral antifungal medicines.  Laser treatment of the nail.  Surgery to remove the nail. This may be needed for the most severe infections. It can take a long time, usually up to a year, for the infection to go away. The infection may also come back. Follow these instructions at home: Medicines  Take or apply over-the-counter and prescription medicines only as told by your health care provider.  Ask your health care provider about using over-the-counter mentholated ointment on your nails. Nail care  Trim your nails often.  Wash and dry your hands and feet every day.  Keep your feet dry: ? Wear absorbent socks, and change your socks frequently. ? Wear shoes that allow air to circulate, such as sandals or canvas tennis shoes. Throw out old shoes.  Do not use artificial nails.  If you go to a nail salon, make sure you choose one that uses clean instruments.  Use antifungal foot powder on your feet and in your shoes. General instructions  Do not share personal items, such as towels or nail clippers.  Do not walk barefoot in shower rooms or locker rooms.  Wear rubber gloves if you are working with your hands in wet areas.  Keep all follow-up visits as told by your health care provider.  This is important. Contact a health care provider if: Your infection is not getting better or it is getting worse after several months. Summary  A fungal nail infection is a common infection of the toenails or fingernails.  Treatment is not needed for mild infections. If you have significant nail changes, treatment may include taking medicine orally and applying medicine to your nails.  It can take a long time, usually up to a year, for the infection to go away. The infection may also come  back.  Take or apply over-the-counter and prescription medicines only as told by your health care provider.  Follow instructions for taking care of your nails to help prevent infection from coming back or spreading. This information is not intended to replace advice given to you by your health care provider. Make sure you discuss any questions you have with your health care provider. Document Revised: 11/07/2018 Document Reviewed: 12/21/2017 Elsevier Patient Education  [place].    Peripheral Neuropathy Peripheral neuropathy is a type of nerve damage. It affects nerves that carry signals between the spinal cord and the arms, legs, and the rest of the body (peripheral nerves). It does not affect nerves in the spinal cord or brain. In peripheral neuropathy, one nerve or a group of nerves may be damaged. Peripheral neuropathy is a broad category that includes many specific nerve disorders, like diabetic neuropathy, hereditary neuropathy, and carpal tunnel syndrome. What are the causes? This condition may be caused by:  Diabetes. This is the most common cause of peripheral neuropathy.  Nerve injury.  Pressure or stress on a nerve that lasts a long time.  Lack (deficiency) of B vitamins. This can result from alcoholism, poor diet, or a restricted diet.  Infections.  Autoimmune diseases, such as rheumatoid arthritis and systemic lupus erythematosus.  Nerve diseases that are passed from parent to child (inherited).  Some medicines, such as cancer medicines (chemotherapy).  Poisonous (toxic) substances, such as lead and mercury.  Too little blood flowing to the legs.  Kidney disease.  Thyroid disease. In some cases, the cause of this condition is not known. What are the signs or symptoms? Symptoms of this condition depend on which of your nerves is damaged. Common symptoms include:  Loss of feeling (numbness) in the feet, hands, or both.  Tingling in the feet, hands, or  both.  Burning pain.  Very sensitive skin.  Weakness.  Not being able to move a part of the body (paralysis).  Muscle twitching.  Clumsiness or poor coordination.  Loss of balance.  Not being able to control your bladder.  Feeling dizzy.  Sexual problems. How is this diagnosed? Diagnosing and finding the cause of peripheral neuropathy can be difficult. Your health care provider will take your medical history and do a physical exam. A neurological exam will also be done. This involves checking things that are affected by your brain, spinal cord, and nerves (nervous system). For example, your health care provider will check your reflexes, how you move, and what you can feel. You may have other tests, such as:  Blood tests.  Electromyogram (EMG) and nerve conduction tests. These tests check nerve function and how well the nerves are controlling the muscles.  Imaging tests, such as CT scans or MRI to rule out other causes of your symptoms.  Removing a small piece of nerve to be examined in a lab (nerve biopsy). This is rare.  Removing and examining a small amount of the fluid that surrounds  the brain and spinal cord (lumbar puncture). This is rare. How is this treated? Treatment for this condition may involve:  Treating the underlying cause of the neuropathy, such as diabetes, kidney disease, or vitamin deficiencies.  Stopping medicines that can cause neuropathy, such as chemotherapy.  Medicine to relieve pain. Medicines may include: ? Prescription or over-the-counter pain medicine. ? Antiseizure medicine. ? Antidepressants. ? Pain-relieving patches that are applied to painful areas of skin.  Surgery to relieve pressure on a nerve or to destroy a nerve that is causing pain.  Physical therapy to help improve movement and balance.  Devices to help you move around (assistive devices). Follow these instructions at home: Medicines  Take over-the-counter and prescription  medicines only as told by your health care provider. Do not take any other medicines without first asking your health care provider.  Do not drive or use heavy machinery while taking prescription pain medicine. Lifestyle   Do not use any products that contain nicotine or tobacco, such as cigarettes and e-cigarettes. Smoking keeps blood from reaching damaged nerves. If you need help quitting, ask your health care provider.  Avoid or limit alcohol. Too much alcohol can cause a vitamin B deficiency, and vitamin B is needed for healthy nerves.  Eat a healthy diet. This includes: ? Eating foods that are high in fiber, such as fresh fruits and vegetables, whole grains, and beans. ? Limiting foods that are high in fat and processed sugars, such as fried or sweet foods. General instructions   If you have diabetes, work closely with your health care provider to keep your blood sugar under control.  If you have numbness in your feet: ? Check every day for signs of injury or infection. Watch for redness, warmth, and swelling. ? Wear padded socks and comfortable shoes. These help protect your feet.  Develop a good support system. Living with peripheral neuropathy can be stressful. Consider talking with a mental health specialist or joining a support group.  Use assistive devices and attend physical therapy as told by your health care provider. This may include using a walker or a cane.  Keep all follow-up visits as told by your health care provider. This is important. Contact a health care provider if:  You have new signs or symptoms of peripheral neuropathy.  You are struggling emotionally from dealing with peripheral neuropathy.  Your pain is not well-controlled. Get help right away if:  You have an injury or infection that is not healing normally.  You develop new weakness in an arm or leg.  You fall frequently. Summary  Peripheral neuropathy is when the nerves in the arms, or legs  are damaged, resulting in numbness, weakness, or pain.  There are many causes of peripheral neuropathy, including diabetes, pinched nerves, vitamin deficiencies, autoimmune disease, and hereditary conditions.  Diagnosing and finding the cause of peripheral neuropathy can be difficult. Your health care provider will take your medical history, do a physical exam, and do tests, including blood tests and nerve function tests.  Treatment involves treating the underlying cause of the neuropathy and taking medicines to help control pain. Physical therapy and assistive devices may also help. This information is not intended to replace advice given to you by your health care provider. Make sure you discuss any questions you have with your health care provider. Document Revised: 06/29/2017 Document Reviewed: 09/25/2016 Elsevier Patient Education  2020 [place].  Neuropathic Pain Neuropathic pain is pain caused by damage to the nerves that are  responsible for certain sensations in your body (sensory nerves). The pain can be caused by:  Damage to the sensory nerves that send signals to your spinal cord and brain (peripheral nervous system).  Damage to the sensory nerves in your brain or spinal cord (central nervous system). Neuropathic pain can make you more sensitive to pain. Even a minor sensation can feel very painful. This is usually a long-term condition that can be difficult to treat. The type of pain differs from person to person. It may:  Start suddenly (acute), or it may develop slowly and last for a long time (chronic).  Come and go as damaged nerves heal, or it may stay at the same level for years.  Cause emotional distress, loss of sleep, and a lower quality of life. What are the causes? The most common cause of this condition is diabetes. Many other diseases and conditions can also cause neuropathic pain. Causes of neuropathic pain can be classified as:  Toxic. This is caused by  medicines and chemicals. The most common cause of toxic neuropathic pain is damage from cancer treatments (chemotherapy).  Metabolic. This can be caused by: ? Diabetes. This is the most common disease that damages the nerves. ? Lack of vitamin B from long-term alcohol abuse.  Traumatic. Any injury that cuts, crushes, or stretches a nerve can cause damage and pain. A common example is feeling pain after losing an arm or leg (phantom limb pain).  Compression-related. If a sensory nerve gets trapped or compressed for a long period of time, the blood supply to the nerve can be cut off.  Vascular. Many blood vessel diseases can cause neuropathic pain by decreasing blood supply and oxygen to nerves.  Autoimmune. This type of pain results from diseases in which the body's defense system (immune system) mistakenly attacks sensory nerves. Examples of autoimmune diseases that can cause neuropathic pain include lupus and multiple sclerosis.  Infectious. Many types of viral infections can damage sensory nerves and cause pain. Shingles infection is a common cause of this type of pain.  Inherited. Neuropathic pain can be a symptom of many diseases that are passed down through families (genetic). What increases the risk? You are more likely to develop this condition if:  You have diabetes.  You smoke.  You drink too much alcohol.  You are taking certain medicines, including medicines that kill cancer cells (chemotherapy) or that treat immune system disorders. What are the signs or symptoms? The main symptom is pain. Neuropathic pain is often described as:  Burning.  Shock-like.  Stinging.  Hot or cold.  Itching. How is this diagnosed? No single test can diagnose neuropathic pain. It is diagnosed based on:  Physical exam and your symptoms. Your health care provider will ask you about your pain. You may be asked to use a pain scale to describe how bad your pain is.  Tests. These may be  done to see if you have a high sensitivity to pain and to help find the cause and location of any sensory nerve damage. They include: ? Nerve conduction studies to test how well nerve signals travel through your sensory nerves (electrodiagnostic testing). ? Stimulating your sensory nerves through electrodes on your skin and measuring the response in your spinal cord and brain (somatosensory evoked potential).  Imaging studies, such as: ? X-rays. ? CT scan. ? MRI. How is this treated? Treatment for neuropathic pain may change over time. You may need to try different treatment options or a combination  of treatments. Some options include:  Treating the underlying cause of the neuropathy, such as diabetes, kidney disease, or vitamin deficiencies.  Stopping medicines that can cause neuropathy, such as chemotherapy.  Medicine to relieve pain. Medicines may include: ? Prescription or over-the-counter pain medicine. ? Anti-seizure medicine. ? Antidepressant medicines. ? Pain-relieving patches that are applied to painful areas of skin. ? A medicine to numb the area (local anesthetic), which can be injected as a nerve block.  Transcutaneous nerve stimulation. This uses electrical currents to block painful nerve signals. The treatment is painless.  Alternative treatments, such as: ? Acupuncture. ? Meditation. ? Massage. ? Physical therapy. ? Pain management programs. ? Counseling. Follow these instructions at home: Medicines   Take over-the-counter and prescription medicines only as told by your health care provider.  Do not drive or use heavy machinery while taking prescription pain medicine.  If you are taking prescription pain medicine, take actions to prevent or treat constipation. Your health care provider may recommend that you: ? Drink enough fluid to keep your urine pale yellow. ? Eat foods that are high in fiber, such as fresh fruits and vegetables, whole grains, and  beans. ? Limit foods that are high in fat and processed sugars, such as fried or sweet foods. ? Take an over-the-counter or prescription medicine for constipation. Lifestyle   Have a good support system at home.  Consider joining a chronic pain support group.  Do not use any products that contain nicotine or tobacco, such as cigarettes and e-cigarettes. If you need help quitting, ask your health care provider.  Do not drink alcohol. General instructions  Learn as much as you can about your condition.  Work closely with all your health care providers to find the treatment plan that works best for you.  Ask your health care provider what activities are safe for you.  Keep all follow-up visits as told by your health care provider. This is important. Contact a health care provider if:  Your pain treatments are not working.  You are having side effects from your medicines.  You are struggling with tiredness (fatigue), mood changes, depression, or anxiety. Summary  Neuropathic pain is pain caused by damage to the nerves that are responsible for certain sensations in your body (sensory nerves).  Neuropathic pain may come and go as damaged nerves heal, or it may stay at the same level for years.  Neuropathic pain is usually a long-term condition that can be difficult to treat. Consider joining a chronic pain support group. This information is not intended to replace advice given to you by your health care provider. Make sure you discuss any questions you have with your health care provider. Document Revised: 11/07/2018 Document Reviewed: 08/03/2017 Elsevier Patient Education  [place].

## 2020-04-09 NOTE — Progress Notes (Signed)
Chief Complaint  Patient presents with  . Annual Exam  . Immunizations    flu shot    Physical  1. Wants flu shot today  2. C/o burning sensation of feet and toes and b/l mid foot pain R>L after walking >9K steps and top of foot swells and is achy and toes feel like they are on fire. Nothing tried  Reviewed MRI L spine and advised this could be coming from her back and f/u Dr. Jeneen Rinks (pain clinic) to consider NCS/EMG b/l lower ext, consider podiatry referral as well vs neurology for NCS/EMG 06/14/16 MRI L spine  IMPRESSION: Newly seen degenerative change at L2-3 with bulging of the disc and mild facet arthropathy. No compressive stenosis.  Newly seen left posterior lateral disc herniation at L4-5 with a 1 cm fragment in the left lateral recess, likely to compress left L5 nerve root.  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 Narrowing of the lateral recesses left more than right. Some potential to affect the S1 nerve roots, particularly the left. Mild foraminal narrowing bilaterally.  3. Obesity weight was 180 was on her scales down to 169.4 today 173.8 she is trying to walk 9K steps but having # 2  4. Mood stable her sister died w/in the last year and she was a smoker with h/o amputation and taking care if her sisters daughter with special needs and just had her placed into a group home she takes trintellix 2.5 mg 2x per week own her own 5. C/o intermittent pain in b/l hips and currently right asked she contact ortho Dr. Roland Rack which she is established (last seen 11/03/19) and f/u chronic pain Dr. Maryanna Shape 6. C/o thickened toenail left foot smaller toe and wants topical tx for this  Review of Systems  Constitutional: Positive for weight loss.  HENT: Negative for hearing loss.   Eyes: Negative for blurred vision.  Respiratory: Negative for shortness of breath.   Cardiovascular: Negative for chest  pain.  Gastrointestinal: Negative for abdominal pain.  Musculoskeletal: Positive for back pain and joint pain. Negative for falls.  Skin: Negative for rash.  Neurological: Positive for tingling.  Psychiatric/Behavioral: Negative for depression. The patient is not nervous/anxious.    Past Medical History:  Diagnosis Date  . Anxiety   . ASCUS with positive high risk HPV cervical   . CAD (coronary artery disease)   . Chicken pox   . Chronic pain    neck, back, b/l hips   . Depression   . GERD (gastroesophageal reflux disease)   . History of Crohn's disease   . Hyperlipidemia   . Left ovarian cyst    s/p removal of 1 ovary ? which one removed per pt   . Libido, decreased   . Skin cancer    reports skin cancer removed from skin in 2016 or 2017  . Thyroid nodule    repeat US resolved and Ct neck 05/2019 normal thyroid    Past Surgical History:  Procedure Laterality Date  . ABDOMINAL HYSTERECTOMY    . APPENDECTOMY  2004  . BLADDER SURGERY    . BREAST BIOPSY     x 2  . COLONOSCOPY    . OOPHORECTOMY Right    Unilateral Right (per MRI 04/17/2018)  . WISDOM TOOTH EXTRACTION     Family History  Problem Relation Age of Onset  . Alcohol abuse Mother   . Hyperlipidemia Mother   .  Heart disease Mother   . Hypertension Mother   . Diabetes Mother   . Crohn's disease Sister        1/2 sister  . Depression Sister   . Osteoporosis Sister   . Leukemia Paternal Grandmother   . Cancer Paternal Aunt        blood, type unknown  . Intellectual disability Niece   . Breast cancer Neg Hx    Social History   Socioeconomic History  . Marital status: Married    Spouse name: douglas  . Number of children: 0  . Years of education: Not on file  . Highest education level: Associate degree: occupational, Hotel manager, or vocational program  Occupational History  . 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 Smoking status: Former Smoker    Types: Cigarettes    Quit date:  08/29/2000    Years since quitting: 19.6  . Smokeless tobacco: Never Used  Vaping Use  . Vaping Use: Never used  Substance and Sexual Activity  . Alcohol use: No    Alcohol/week: 0.0 standard drinks    Comment: social   . Drug use: No  . Sexual activity: Not Currently    Birth control/protection: Surgical    Comment: Hysterectomy  Other Topics Concern  . Not on file  Social History Narrative   Married Doug   Works [employer]    Social Determinants of Radio broadcast assistant Strain:   . Difficulty of Paying Living Expenses: Not on file  Food Insecurity:   . Worried About Charity fundraiser in the Last Year: Not on file  . Ran Out of Food in the Last Year: Not on file  Transportation Needs:   . Lack of Transportation (Medical): Not on file  . Lack of Transportation (Non-Medical): Not on file  Physical Activity:   . Days of Exercise per Week: Not on file  . Minutes of Exercise per Session: Not on file  Stress:   . Feeling of Stress : Not on file  Social Connections:   . Frequency of Communication with Friends and Family: Not on file  . Frequency of Social Gatherings with Friends and Family: Not on file  . Attends Religious Services: Not on file  . Active Member of Clubs or Organizations: Not on file  . Attends Archivist Meetings: Not on file  . Marital Status: Not on file  Intimate Partner Violence:   . Fear of Current or Ex-Partner: Not on file  . Emotionally Abused: Not on file  . Physically Abused: Not on file  . Sexually Abused: Not on file   Current Meds  Medication Sig  . Ascorbic Acid (VITAMIN C) 1000 MG tablet Take 1,000 mg by mouth daily.   Marland Kitchen atorvastatin (LIPITOR) 10 MG tablet Take 1 tablet (10 mg total) by mouth daily. At night  . buPROPion (WELLBUTRIN XL) 300 MG 24 hr tablet Take 1 tablet (300 mg total) by mouth daily.  . celecoxib (CELEBREX) 200 MG capsule Take 1 capsule (200 mg total) by mouth daily.  . clotrimazole-betamethasone  (LOTRISONE) cream Apply externally BID prn sx up to 2 wks  . estradiol (CLIMARA - DOSED IN MG/24 HR) 0.0375 mg/24hr patch Place 1 patch (0.0375 mg total) onto the skin once a week.  . fentaNYL (DURAGESIC) 12 MCG/HR   . fluconazole (DIFLUCAN) 150 MG tablet Take 1 tablet orally, then repeat in 3 days  . lidocaine (LIDODERM) 5 % Place  2 patches onto the skin daily.   . Multiple Vitamin (MULTI-VITAMINS) TABS Take 1 tablet by mouth daily.   . Omega-3 Fatty Acids (FISH OIL PO) Take 1 capsule by mouth daily.   Marland Kitchen omeprazole (PRILOSEC) 20 MG capsule Take 1 capsule (20 mg total) by mouth daily. In am before food 30 minutes  . promethazine (PHENERGAN) 25 MG tablet Take 1 tablet (25 mg total) by mouth 2 (two) times daily as needed for nausea or vomiting.  Marland Kitchen tiZANidine (ZANAFLEX) 4 MG capsule Take 1 capsule (4 mg total) by mouth at bedtime as needed for muscle spasms.  . TRINTELLIX 5 MG TABS tablet Take 5 mg by mouth daily.  . valACYclovir (VALTREX) 1000 MG tablet Take 1 tablet (1,000 mg total) by mouth 2 (two) times daily. X 3-7 days prn  . [DISCONTINUED] celecoxib (CELEBREX) 200 MG capsule Take 1 capsule (200 mg total) by mouth daily.  . [DISCONTINUED] omeprazole (PRILOSEC) 20 MG capsule Take 1 capsule (20 mg total) by mouth daily. In am before food 30 minutes  . [DISCONTINUED] tiZANidine (ZANAFLEX) 4 MG capsule Take 1 capsule (4 mg total) by mouth at bedtime as needed for muscle spasms.   Allergies  Allergen Reactions  . Propofol Palpitations    BP drops and EKG changes, bladder retention  . Nsaids Other (See Comments)    Flare of Crohn's.  . Pentasa [Mesalamine] Other (See Comments)    Chest pain.   No results found for this or any previous visit (from the past 2160 hour(s)). Objective  Body mass index is 30.79 kg/m. Wt Readings from Last 3 Encounters:  04/09/20 173 lb 12.8 oz (78.8 kg)  10/16/19 180 lb (81.6 kg)  09/09/19 182 lb (82.6 kg)   Temp Readings from Last 3 Encounters:  04/09/20  98.2 F (36.8 C) (Oral)  05/09/19 97.9 F (36.6 C) (Oral)  05/30/18 98.3 F (36.8 C) (Oral)   BP Readings from Last 3 Encounters:  04/09/20 118/72  10/16/19 120/70  09/09/19 121/81   Pulse Readings from Last 3 Encounters:  04/09/20 74  05/09/19 69  06/04/18 80    Physical Exam Vitals and nursing note reviewed.  Constitutional:      Appearance: Normal appearance. She is well-developed and well-groomed. She is obese.  HENT:     Head: Normocephalic and atraumatic.  Eyes:     Conjunctiva/sclera: Conjunctivae normal.     Pupils: Pupils are equal, round, and reactive to light.  Cardiovascular:     Rate and Rhythm: Normal rate and regular rhythm.     Heart sounds: Normal heart sounds. No murmur heard.   Pulmonary:     Effort: Pulmonary effort is normal.     Breath sounds: Normal breath sounds.  Abdominal:     General: Abdomen is flat. Bowel sounds are normal.     Tenderness: There is no abdominal tenderness.  Skin:    General: Skin is warm and dry.  Neurological:     General: No focal deficit present.     Mental Status: She is alert and oriented to person, place, and time. Mental status is at baseline.     Gait: Gait normal.  Psychiatric:        Attention and Perception: Attention and perception normal.        Mood and Affect: Mood and affect normal.        Speech: Speech normal.        Behavior: Behavior normal. Behavior is cooperative.  Thought Content: Thought content normal.        Cognition and Memory: Cognition and memory normal.        Judgment: Judgment normal.     Assessment  Plan  Annual physical exam -  Given labs [facility] to be done fasting as of 04/09/20 Needs flu shot - Plan: Flu Vaccine QUAD 6+ mos PF IM (Fluarix Quad PF) hep B3/3 vaccineshadimmune shingrixhad2/2  2/2 moderna covid 19 vaccines utd Tdap rec MMR  Sp partial hysterectomy. H/o Pap ASCUS 07/19/17 f/u with Dr. Garnette Czech 07/22/18 negative except yeastnow with Fraser Din Pap 43/27/61 normal except yeast  Colonoscopy pt prev wants to hold as disc prev visits will keep encouraging given h/o Crohns per hx, FH polpys  -cant tolerate proprofol -disced 05/09/2019 declines for now but wants Dr. Silverio Decamp [facility] in future She will let me know when ready as of 04/09/20  [place] [facility] [place] normal   Ordered for 2022 mammogram   Dermatology saw recently Dr. Kellie Moor 08/2019 normal no bx's   DEXA 01/28/19 normal   Former smoker quit in 2002   Thyroid nodule noted on problem list she did have in the past but resolved after repeat thyroid US and CT scan 05/06/19 negative thyroid nodules    Burning sensation of feet - Plan: DG Foot Complete Right, Ambulatory referral to Podiatry Consider EMG/NCS etiology could be from back see HPI Consider neurology vs [facility]u [facility] for EMG/NCS Labs   Pain in both feet - Plan: DG Foot Complete Right, Ambulatory referral to Podiatry  Onychomycosis - Plan: ciclopirox (PENLAC) 8 % solution  Cervicalgia - Plan: celecoxib (CELEBREX) 200 MG capsule, tiZANidine (ZANAFLEX) 4 MG capsule  Pain of both hip joints - Plan: celecoxib (CELEBREX) 200 MG capsule, tiZANidine (ZANAFLEX) 4 MG capsule F/u ortho Dr. Roland Rack call and sch appt   Low back pain, unspecified back pain laterality, unspecified chronicity, unspecified whether sciatica present - Plan: tiZANidine (ZANAFLEX) 4 MG capsule [facility] Dr. Jeneen Rinks  Gastroesophageal reflux disease without esophagitis - Plan: omeprazole (PRILOSEC) 20 MG capsule  Fibrocystic breast disease (FCBD), unspecified laterality - Plan: MM 3D SCREEN BREAST BILATERAL  Anxiety and depression Mood stable takes trintellix 2.5 mg 2x per week due to c/w weight gain  Could consider therapy in future   Obesity (BMI 30.0-34.9)  Cont healthy diet and exercise down from 180 lbs congrats.   Specialists:  [facility] Dr. Maryanna Shape pain clinic  [facility]  [facility] Dr. Ubaldo Glass[facility] in past [place] [facility]   Provider: Dr. Olivia Mackie [name]-[facility]

## 2020-04-11 ENCOUNTER — Encounter: Payer: Self-pay | Admitting: Internal Medicine

## 2020-04-12 ENCOUNTER — Encounter: Payer: Self-pay | Admitting: Internal Medicine

## 2020-04-12 DIAGNOSIS — R208 Other disturbances of skin sensation: Secondary | ICD-10-CM | POA: Insufficient documentation

## 2020-04-12 DIAGNOSIS — M79671 Pain in right foot: Secondary | ICD-10-CM | POA: Insufficient documentation

## 2020-04-12 DIAGNOSIS — M79672 Pain in left foot: Secondary | ICD-10-CM | POA: Insufficient documentation

## 2020-04-12 DIAGNOSIS — N6019 Diffuse cystic mastopathy of unspecified breast: Secondary | ICD-10-CM

## 2020-04-12 DIAGNOSIS — B351 Tinea unguium: Secondary | ICD-10-CM | POA: Insufficient documentation

## 2020-04-12 DIAGNOSIS — M25551 Pain in right hip: Secondary | ICD-10-CM

## 2020-04-12 HISTORY — DX: Diffuse cystic mastopathy of unspecified breast: N60.19

## 2020-04-12 HISTORY — DX: Tinea unguium: B35.1

## 2020-04-12 HISTORY — DX: Pain in right hip: M25.551

## 2020-04-12 MED ORDER — TIZANIDINE HCL 4 MG PO CAPS: 4.0000 mg | ORAL_CAPSULE | Freq: Every evening | ORAL | 5 refills | Status: DC | PRN

## 2020-04-12 MED ORDER — CELECOXIB 200 MG PO CAPS: 200.0000 mg | ORAL_CAPSULE | Freq: Every day | ORAL | 3 refills | Status: DC

## 2020-04-12 MED ORDER — OMEPRAZOLE 20 MG PO CPDR: 20.0000 mg | DELAYED_RELEASE_CAPSULE | Freq: Every day | ORAL | 3 refills | Status: DC

## 2020-04-12 NOTE — Telephone Encounter (Signed)
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

## 2020-04-14 ENCOUNTER — Encounter: Payer: Self-pay | Admitting: Internal Medicine

## 2020-04-14 ENCOUNTER — Other Ambulatory Visit: Payer: Self-pay | Admitting: Internal Medicine

## 2020-04-14 DIAGNOSIS — K219 Gastro-esophageal reflux disease without esophagitis: Secondary | ICD-10-CM

## 2020-04-14 MED ORDER — OMEPRAZOLE 20 MG PO CPDR: 20.0000 mg | DELAYED_RELEASE_CAPSULE | Freq: Every day | ORAL | 3 refills | Status: DC

## 2020-04-17 LAB — URINALYSIS, ROUTINE W REFLEX MICROSCOPIC
Bilirubin, UA: NEGATIVE
Glucose, UA: NEGATIVE
Ketones, UA: NEGATIVE
Leukocytes,UA: NEGATIVE
Nitrite, UA: NEGATIVE
Protein,UA: NEGATIVE
RBC, UA: NEGATIVE
Specific Gravity, UA: 1.006 (ref 1.005–1.030)
Urobilinogen, Ur: 0.2 mg/dL (ref 0.2–1.0)
pH, UA: 6.5 (ref 5.0–7.5)

## 2020-04-17 LAB — COMPREHENSIVE METABOLIC PANEL
ALT: 26 IU/L (ref 0–32)
AST: 26 IU/L (ref 0–40)
Albumin/Globulin Ratio: 1.7 (ref 1.2–2.2)
Albumin: 4.7 g/dL (ref 3.8–4.9)
Alkaline Phosphatase: 84 IU/L (ref 44–121)
BUN/Creatinine Ratio: 15 (ref 9–23)
BUN: 16 mg/dL (ref 6–24)
Bilirubin Total: 0.4 mg/dL (ref 0.0–1.2)
CO2: 26 mmol/L (ref 20–29)
Calcium: 9.7 mg/dL (ref 8.7–10.2)
Chloride: 100 mmol/L (ref 96–106)
Creatinine, Ser: 1.09 mg/dL — ABNORMAL HIGH (ref 0.57–1.00)
GFR calc Af Amer: 67 mL/min/{1.73_m2} (ref >59)
GFR calc non Af Amer: 58 mL/min/{1.73_m2} — ABNORMAL LOW (ref >59)
Globulin, Total: 2.7 g/dL (ref 1.5–4.5)
Glucose: 90 mg/dL (ref 65–99)
Potassium: 4.3 mmol/L (ref 3.5–5.2)
Sodium: 140 mmol/L (ref 134–144)
Total Protein: 7.4 g/dL (ref 6.0–8.5)

## 2020-04-17 LAB — LIPID PANEL
Chol/HDL Ratio: 4.4 ratio (ref 0.0–4.4)
Cholesterol, Total: 176 mg/dL (ref 100–199)
HDL: 40 mg/dL (ref >39)
LDL Chol Calc (NIH): 105 mg/dL — ABNORMAL HIGH (ref 0–99)
Triglycerides: 175 mg/dL — ABNORMAL HIGH (ref 0–149)
VLDL Cholesterol Cal: 31 mg/dL (ref 5–40)

## 2020-04-17 LAB — CBC WITH DIFFERENTIAL/PLATELET
Basophils Absolute: 0 10*3/uL (ref 0.0–0.2)
Basos: 0 %
EOS (ABSOLUTE): 0.2 10*3/uL (ref 0.0–0.4)
Eos: 2 %
Hematocrit: 42.6 % (ref 34.0–46.6)
Hemoglobin: 14.1 g/dL (ref 11.1–15.9)
Immature Grans (Abs): 0 10*3/uL (ref 0.0–0.1)
Immature Granulocytes: 0 %
Lymphocytes Absolute: 2 10*3/uL (ref 0.7–3.1)
Lymphs: 27 %
MCH: 30.7 pg (ref 26.6–33.0)
MCHC: 33.1 g/dL (ref 31.5–35.7)
MCV: 93 fL (ref 79–97)
Monocytes Absolute: 0.8 10*3/uL (ref 0.1–0.9)
Monocytes: 11 %
Neutrophils Absolute: 4.6 10*3/uL (ref 1.4–7.0)
Neutrophils: 60 %
Platelets: 284 10*3/uL (ref 150–450)
RBC: 4.6 x10E6/uL (ref 3.77–5.28)
RDW: 12.5 % (ref 11.7–15.4)
WBC: 7.7 10*3/uL (ref 3.4–10.8)

## 2020-04-17 LAB — VITAMIN B12: Vitamin B-12: 818 pg/mL (ref 232–1245)

## 2020-04-17 LAB — TSH: TSH: 4.1 u[IU]/mL (ref 0.450–4.500)

## 2020-04-17 LAB — FOLATE: Folate: >20 ng/mL (ref >3.0)

## 2020-04-20 ENCOUNTER — Encounter: Payer: Self-pay | Admitting: Internal Medicine

## 2020-04-20 ENCOUNTER — Encounter: Payer: Self-pay | Admitting: Podiatry

## 2020-04-20 ENCOUNTER — Ambulatory Visit: Payer: Managed Care, Other (non HMO) | Admitting: Podiatry

## 2020-04-20 ENCOUNTER — Other Ambulatory Visit: Payer: Self-pay

## 2020-04-20 DIAGNOSIS — M79673 Pain in unspecified foot: Secondary | ICD-10-CM

## 2020-04-20 DIAGNOSIS — M791 Myalgia, unspecified site: Secondary | ICD-10-CM | POA: Diagnosis not present

## 2020-04-20 DIAGNOSIS — B351 Tinea unguium: Secondary | ICD-10-CM

## 2020-04-20 MED ORDER — TERBINAFINE HCL 250 MG PO TABS: 250.0000 mg | ORAL_TABLET | Freq: Every day | ORAL | 0 refills | Status: DC

## 2020-04-20 NOTE — Progress Notes (Signed)
Subjective:  Patient ID: Carolyn Esparza, female    DOB: April 09, 1966,  MRN: [id number]  Chief Complaint  Patient presents with  . Foot Pain    Patient presents today for bilat foot pain, stiffness worse in mornings and standing from sittng.  She says her toes can be sensitive/sore and burns at times.  She also c/o nail fungus  . Nail Problem    54 y.o. female presents with the above complaint.  Patient presents with complaint of generalized aches/muscle ache and arch pain to bilateral lower extremity.  Patient states that she has a little high arch foot.  She states that she wears proper shoes however they are not helped.  She denies wearing any kind of insoles or orthotics.  Patient states her foot becomes very sore.  She has obtained a size of shoes which has helped a little bit.  She would like to discuss future treatment options she has not seen anyone else prior to seeing me.  She has secondary complaint of left second digit onychomycosis.  Patient states that it is thickened and slightly discolored and would like to get it treated.  She has not done anything besides some over-the-counter medications.  She denies any liver issues.   Review of Systems: Negative except as noted in the HPI. Denies N/V/F/Ch.  Past Medical History:  Diagnosis Date  . Anxiety   . ASCUS with positive high risk HPV cervical   . CAD (coronary artery disease)   . Chicken pox   . Chronic pain    neck, back, b/l hips   . Depression   . GERD (gastroesophageal reflux disease)   . History of Crohn's disease   . Hyperlipidemia   . Left ovarian cyst    s/p removal of 1 ovary ? which one removed per pt   . Libido, decreased   . Skin cancer    reports skin cancer removed from skin in 2016 or 2017  . Thyroid nodule    repeat US resolved and Ct neck 05/2019 normal thyroid     Current Outpatient Medications:  .  Ascorbic Acid (VITAMIN C) 1000 MG tablet, Take 1,000 mg by mouth daily. , Disp: , Rfl:  .  atorvastatin  (LIPITOR) 10 MG tablet, Take 1 tablet (10 mg total) by mouth daily. At night, Disp: 90 tablet, Rfl: 1 .  buPROPion (WELLBUTRIN XL) 300 MG 24 hr tablet, Take 1 tablet (300 mg total) by mouth daily., Disp: 90 tablet, Rfl: 3 .  celecoxib (CELEBREX) 200 MG capsule, Take 1 capsule (200 mg total) by mouth daily., Disp: 90 capsule, Rfl: 3 .  ciclopirox (PENLAC) 8 % solution, Apply topically at bedtime. Apply over nail and surrounding skin. Apply daily over previous coat. After seven (7) days, may remove with alcohol and continue cycle., Disp: 6.6 mL, Rfl: 5 .  clotrimazole-betamethasone (LOTRISONE) cream, Apply externally BID prn sx up to 2 wks, Disp: 15 g, Rfl: 0 .  estradiol (CLIMARA - DOSED IN MG/24 HR) 0.0375 mg/24hr patch, Place 1 patch (0.0375 mg total) onto the skin once a week., Disp: 12 patch, Rfl: 4 .  fentaNYL (DURAGESIC) 12 MCG/HR, , Disp: , Rfl:  .  fluconazole (DIFLUCAN) 150 MG tablet, Take 1 tablet orally, then repeat in 3 days, Disp: 2 tablet, Rfl: 0 .  lidocaine (LIDODERM) 5 %, Place 2 patches onto the skin daily. , Disp: , Rfl:  .  Multiple Vitamin (MULTI-VITAMINS) TABS, Take 1 tablet by mouth daily. , Disp: , Rfl:  .  Omega-3 Fatty Acids (FISH OIL PO), Take 1 capsule by mouth daily. , Disp: , Rfl:  .  omeprazole (PRILOSEC) 20 MG capsule, Take 1 capsule (20 mg total) by mouth daily. In am before food 30 minutes, Disp: 90 capsule, Rfl: 3 .  promethazine (PHENERGAN) 25 MG tablet, Take 1 tablet (25 mg total) by mouth 2 (two) times daily as needed for nausea or vomiting., Disp: 30 tablet, Rfl: 2 .  terbinafine (LAMISIL) 250 MG tablet, Take 1 tablet (250 mg total) by mouth daily., Disp: 90 tablet, Rfl: 0 .  tiZANidine (ZANAFLEX) 4 MG capsule, Take 1 capsule (4 mg total) by mouth at bedtime as needed for muscle spasms., Disp: 30 capsule, Rfl: 5 .  TRINTELLIX 5 MG TABS tablet, Take 5 mg by mouth daily., Disp: , Rfl:  .  valACYclovir (VALTREX) 1000 MG tablet, Take 1 tablet (1,000 mg total) by  mouth 2 (two) times daily. X 3-7 days prn, Disp: 60 tablet, Rfl: 11  Social History   Tobacco Use  Smoking Status Former Smoker  . Types: Cigarettes  . Quit date: 08/29/2000  . Years since quitting: 19.6  Smokeless Tobacco Never Used    Allergies  Allergen Reactions  . Propofol Palpitations    BP drops and EKG changes, bladder retention  . Nsaids Other (See Comments)    Flare of Crohn's.  . Pentasa [Mesalamine] Other (See Comments)    Chest pain.   Objective:  There were no vitals filed for this visit. There is no height or weight on file to calculate BMI. Constitutional Well developed. Well nourished.  Vascular Dorsalis pedis pulses palpable bilaterally. Posterior tibial pulses palpable bilaterally. Capillary refill normal to all digits.  No cyanosis or clubbing noted. Pedal hair growth normal.  Neurologic Normal speech. Oriented to person, place, and time. Epicritic sensation to light touch grossly present bilaterally.  Dermatologic  left second digit onychomycosis with thickened elongated dystrophic nails x1. No open wounds. No skin lesions.  Orthopedic:  No focal pain noted today in both lower extremity.  Patient has subjective complaints of generalized aches to from ankle into the arch as well as the heel bilaterally.  Appears to be mild to moderate in nature.   Radiographs: None Assessment:   1. Nail fungus   2. Onychomycosis due to dermatophyte   3. Generalized muscle ache   4. Arch pain, unspecified laterality    Plan:  Patient was evaluated and treated and all questions answered.  Bilateral General muscle ache/arch pain -I explained patient the etiology of muscle ache and arch pain and various treatment options were discussed.  I explained to the patient this is likely attributed to the improper shoe gear as well as not providing enough support into the arches of the foot.  Given that this happens at the end of the day when she has been on her foot for most  of the day.  I believe patient will benefit from over-the-counter power steps.  Patient states she will obtain them online.  I have encouraged her to obtain proper shoes which includes new balance,Asics, Brooks and insert the over-the-counter power steps orthotics.  Patient states understanding will do so.  Left second digit onychomycosis/nail fungus -Educated the patient on the etiology of onychomycosis and various treatment options associated with improving the fungal load.  I explained to the patient that there is 3 treatment options available to treat the onychomycosis including topical, p.o., laser treatment.  Patient elected to undergo p.o. options with Lamisil/terbinafine therapy.  Lamisil prescription was sent to the pharmacy as patient's liver functions test that was recently done was cleared.   No follow-ups on file.

## 2020-04-20 NOTE — Telephone Encounter (Signed)
-----   Message from Delorise Jackson, MD sent at 04/19/2020  3:54 PM EDT ----- Triglycerides worse and LDL (bad) cholesterol  -mail cholesterol handout   Kidney #s increase -avoid nsaids -rec increase water 55-64 ounces daily  -she may want to hold celebrex for now as well  -Rhiann Boucher order [facility] repeat BMET in 2-4 weeks  Blood cts normal  Thyroid lab normal   Urine normal   K10 and folic acid normal

## 2020-04-20 NOTE — Telephone Encounter (Deleted)
-----   Message from Delorise Jackson, MD sent at 04/19/2020  3:54 PM EDT ----- Triglycerides worse and LDL (bad) cholesterol  -mail cholesterol handout   Kidney #s increase -avoid nsaids -rec increase water 55-64 ounces daily  -she may want to hold celebrex for now as well  -Ellis Mehaffey order [facility] repeat BMET in 2-4 weeks  Blood cts normal  Thyroid lab normal   Urine normal   K06 and folic acid normal

## 2020-04-20 NOTE — Telephone Encounter (Signed)
Called pharmacy and this medication does need a PA. They will re-send this form to [place]. Confirmed fax number with them.

## 2020-04-22 NOTE — Telephone Encounter (Signed)
Prior authorization has been submitted.  Awaiting approval or denial.

## 2020-07-06 ENCOUNTER — Encounter: Payer: Self-pay | Admitting: Internal Medicine

## 2020-07-06 DIAGNOSIS — Z0000 Encounter for general adult medical examination without abnormal findings: Secondary | ICD-10-CM

## 2020-07-06 DIAGNOSIS — G629 Polyneuropathy, unspecified: Secondary | ICD-10-CM

## 2020-07-06 DIAGNOSIS — Z1159 Encounter for screening for other viral diseases: Secondary | ICD-10-CM

## 2020-07-07 NOTE — Addendum Note (Signed)
Addended by: Thressa Sheller on: 07/07/2020 10:32 AM   Modules accepted: Orders

## 2020-07-10 ENCOUNTER — Other Ambulatory Visit: Payer: Self-pay | Admitting: Obstetrics & Gynecology

## 2020-08-01 ENCOUNTER — Encounter: Payer: Self-pay | Admitting: Internal Medicine

## 2020-08-02 ENCOUNTER — Ambulatory Visit: Payer: Managed Care, Other (non HMO) | Admitting: Obstetrics & Gynecology

## 2020-08-06 ENCOUNTER — Encounter: Payer: Self-pay | Admitting: Gastroenterology

## 2020-08-06 ENCOUNTER — Ambulatory Visit: Payer: Managed Care, Other (non HMO) | Admitting: Gastroenterology

## 2020-08-06 VITALS — BP 120/70 | HR 78 | Ht 63.0 in | Wt 169.0 lb

## 2020-08-06 DIAGNOSIS — Z1211 Encounter for screening for malignant neoplasm of colon: Secondary | ICD-10-CM

## 2020-08-06 DIAGNOSIS — K581 Irritable bowel syndrome with constipation: Secondary | ICD-10-CM | POA: Diagnosis not present

## 2020-08-06 DIAGNOSIS — K5904 Chronic idiopathic constipation: Secondary | ICD-10-CM | POA: Diagnosis not present

## 2020-08-06 MED ORDER — PLENVU 140 G PO SOLR: ORAL | 0 refills | Status: DC

## 2020-08-06 MED ORDER — DICYCLOMINE HCL 10 MG PO CAPS
10.0000 mg | ORAL_CAPSULE | Freq: Three times a day (TID) | ORAL | 0 refills | Status: DC
Start: 2020-08-06 — End: 2020-11-30

## 2020-08-06 NOTE — Patient Instructions (Signed)
You have been scheduled for a Colonoscopy at [facility] on 10/25/2020, Separate instructions have been given  We have sent dicyclomine to your pharmacy  Due to recent changes in healthcare laws, you may see the results of your imaging and laboratory studies on MyChart before your provider has had a chance to review them.  We understand that in some cases there may be results that are confusing or concerning to you. Not all laboratory results come back in the same time frame and the provider may be waiting for multiple results in order to interpret others.  Please give [place] 48 hours in order for your provider to thoroughly review all the results before contacting the office for clarification of your results.   If you are age 55 or older, your body mass index should be between 23-30. Your Body mass index is 29.94 kg/m. If this is out of the aforementioned range listed, please consider follow up with your Primary Care Provider.  If you are age 17 or younger, your body mass index should be between 19-25. Your Body mass index is 29.94 kg/m. If this is out of the aformentioned range listed, please consider follow up with your Primary Care Provider.    I appreciate the  opportunity to care for you  Thank You   Harl Bowie , MD

## 2020-08-06 NOTE — Progress Notes (Signed)
Carolyn Esparza    JE:[id number]    [birth year]  Primary Care Physician:[name], [name], MD  Referring Physician: [name], [name], MD [place],  [place] [id number]   Chief complaint: Colorectal cancer screening HPI: 55 year old very pleasant female here to discuss colorectal cancer screening  She is having regular bowel movements with formed stool, no rectal bleeding or pain associated with defecation.  She has intermittent lower abdominal cramping, was worse when she was taking chia seeds, she has stopped eating them.  She has sleep apnea on CPAP, gets nausea when she uses the CPAP mask, wakes up in the middle of the night with increased abdominal bloating and discomfort.  She had colonic and TI ulcers in 2004, feels was associated with excessive use of ibuprofen.  Subsequent colonoscopy in October 2015 did not show any ulcers or evidence to support Crohn's disease or IBD.    Outpatient Encounter Medications as of 08/06/2020  Medication Sig  . Ascorbic Acid (VITAMIN C) 1000 MG tablet Take 1,000 mg by mouth daily.   Marland Kitchen atorvastatin (LIPITOR) 10 MG tablet Take 1 tablet (10 mg total) by mouth daily. At night  . buPROPion (WELLBUTRIN XL) 300 MG 24 hr tablet Take 1 tablet (300 mg total) by mouth daily.  . celecoxib (CELEBREX) 200 MG capsule Take 1 capsule (200 mg total) by mouth daily.  . ciclopirox (PENLAC) 8 % solution Apply topically at bedtime. Apply over nail and surrounding skin. Apply daily over previous coat. After seven (7) days, may remove with alcohol and continue cycle.  . clotrimazole-betamethasone (LOTRISONE) cream Apply externally BID prn sx up to 2 wks  . estradiol (CLIMARA - DOSED IN MG/24 HR) 0.0375 mg/24hr patch APPLY 1 PATCH EXTERNALLY TO THE SKIN ONCE A WEEK  . fentaNYL (DURAGESIC) 12 MCG/HR   . fluconazole (DIFLUCAN) 150 MG tablet Take 1 tablet orally, then repeat in 3 days  . lidocaine (LIDODERM) 5 % Place 2 patches onto the  skin daily.   . Multiple Vitamin (MULTI-VITAMINS) TABS Take 1 tablet by mouth daily.   . Omega-3 Fatty Acids (FISH OIL PO) Take 1 capsule by mouth daily.   Marland Kitchen omeprazole (PRILOSEC) 20 MG capsule Take 1 capsule (20 mg total) by mouth daily. In am before food 30 minutes  . promethazine (PHENERGAN) 25 MG tablet Take 1 tablet (25 mg total) by mouth 2 (two) times daily as needed for nausea or vomiting.  . terbinafine (LAMISIL) 250 MG tablet Take 1 tablet (250 mg total) by mouth daily.  Marland Kitchen tiZANidine (ZANAFLEX) 4 MG capsule Take 1 capsule (4 mg total) by mouth at bedtime as needed for muscle spasms.  . TRINTELLIX 5 MG TABS tablet Take 5 mg by mouth daily.  . valACYclovir (VALTREX) 1000 MG tablet Take 1 tablet (1,000 mg total) by mouth 2 (two) times daily. X 3-7 days prn   No facility-administered encounter medications on file as of 08/06/2020.    Allergies as of 08/06/2020 - Review Complete 04/20/2020  Allergen Reaction Noted  . Propofol Palpitations 12/13/2015  . Nsaids Other (See Comments) 12/13/2015  . Pentasa [mesalamine] Other (See Comments) 12/13/2015    Past Medical History:  Diagnosis Date  . Anxiety   . ASCUS with positive high risk HPV cervical   . CAD (coronary artery disease)   . Chicken pox   . Chronic pain    neck, back, b/l hips   . Depression   . GERD (gastroesophageal reflux  disease)   . History of Crohn's disease   . Hyperlipidemia   . Left ovarian cyst    s/p removal of 1 ovary ? which one removed per pt   . Libido, decreased   . Skin cancer    reports skin cancer removed from skin in 2016 or 2017  . Thyroid nodule    repeat US resolved and Ct neck 05/2019 normal thyroid     Past Surgical History:  Procedure Laterality Date  . ABDOMINAL HYSTERECTOMY    . APPENDECTOMY  2004  . BLADDER SURGERY    . BREAST BIOPSY     x 2  . COLONOSCOPY    . OOPHORECTOMY Right    Unilateral Right (per MRI 04/17/2018)  . WISDOM TOOTH EXTRACTION      Family History  Problem  Relation Age of Onset  . Alcohol abuse Mother   . Hyperlipidemia Mother   . Heart disease Mother   . Hypertension Mother   . Diabetes Mother   . Crohn's disease Sister        1/2 sister  . Depression Sister   . Osteoporosis Sister   . Leukemia Paternal Grandmother   . Cancer Paternal Aunt        blood, type unknown  . Intellectual disability Niece   . Breast cancer Neg Hx     Social History   Socioeconomic History  . Marital status: Married    Spouse name: Carolyn Esparza  . Number of children: 0  . Years of education: Not on file  . Highest education level: Associate degree: occupational, Hotel manager, or vocational program  Occupational History  . 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 Smoking status: Former Smoker    Types: Cigarettes    Quit date: 08/29/2000    Years since quitting: 19.9  . Smokeless tobacco: Never Used  Vaping Use  . Vaping Use: Never used  Substance and Sexual Activity  . Alcohol use: No    Alcohol/week: 0.0 standard drinks    Comment: social   . Drug use: No  . Sexual activity: Not Currently    Birth control/protection: Surgical    Comment: Hysterectomy  Other Topics Concern  . Not on file  Social History Narrative   Married Doug   Works [employer]    Social Determinants of Radio broadcast assistant Strain: Not on file  Food Insecurity: Not on file  Transportation Needs: Not on file  Physical Activity: Not on file  Stress: Not on file  Social Connections: Not on file  Intimate Partner Violence: Not on file      Review of systems: All other review of systems negative except as mentioned in the HPI.   Physical Exam: Vitals:   08/06/20 0929  BP: 120/70  Pulse: 78   Body mass index is 29.94 kg/m. Gen:      No acute distress HEENT:  sclera anicteric Abd:      soft, non-tender; no palpable masses, no distension Ext:    No edema Neuro: alert and oriented x 3 Psych: normal mood and affect  Data  Reviewed:  Reviewed labs, radiology imaging, old records and pertinent past GI work up   Assessment and Plan/Recommendations:  55 year old very pleasant female with chronic irritable bowel syndrome here to discuss colorectal cancer screening  We will schedule for screening colonoscopy at [facility] endoscopy unit as per patient request.  She had a cardiac event/?  NSTEMI s/p last  colonoscopy.  According to patient it was mild heart attack and did not need any intervention.  Records are not available to review as it was done at outside facility.  The risks and benefits as well as alternatives of endoscopic procedure(s) have been discussed and reviewed. All questions answered. The patient agrees to proceed.  Ulceration and findings of colitis in 2004 likely secondary to NSAIDs, her symptoms have since resolved   Intermittent lower abdominal cramping and bloating associated with irritable bowel syndrome Use dicyclomine 10 mg every 8 hours as needed   The patient was provided an opportunity to ask questions and all were answered. The patient agreed with the plan and demonstrated an understanding of the instructions.  Damaris Hippo , MD    CC: [name], Olivia Mackie *

## 2020-08-13 ENCOUNTER — Telehealth: Payer: Self-pay | Admitting: Internal Medicine

## 2020-08-13 NOTE — Telephone Encounter (Signed)
Patient went to [facility] to have 07/07/2020 labs done. [facility] can not see them, please resend

## 2020-08-16 ENCOUNTER — Other Ambulatory Visit: Payer: Self-pay | Admitting: Internal Medicine

## 2020-08-16 DIAGNOSIS — Z13818 Encounter for screening for other digestive system disorders: Secondary | ICD-10-CM

## 2020-08-16 NOTE — Addendum Note (Signed)
Addended by: Leeanne Rio on: 08/16/2020 11:57 AM   Modules accepted: Orders

## 2020-08-16 NOTE — Telephone Encounter (Signed)
Reordered CMP & notified pt via mychart

## 2020-08-16 NOTE — Addendum Note (Signed)
Addended by: Leeanne Rio on: 08/16/2020 11:48 AM   Modules accepted: Orders

## 2020-08-19 ENCOUNTER — Other Ambulatory Visit: Payer: Self-pay

## 2020-08-19 ENCOUNTER — Encounter: Payer: Self-pay | Admitting: Gastroenterology

## 2020-08-19 MED ORDER — NA SULFATE-K SULFATE-MG SULF 17.5-3.13-1.6 GM/177ML PO SOLN: 1.0000 | Freq: Once | ORAL | 0 refills | Status: AC

## 2020-08-28 LAB — COMPREHENSIVE METABOLIC PANEL
ALT: 17 IU/L (ref 0–32)
AST: 26 IU/L (ref 0–40)
Albumin/Globulin Ratio: 2 (ref 1.2–2.2)
Albumin: 4.9 g/dL (ref 3.8–4.9)
Alkaline Phosphatase: 77 IU/L (ref 44–121)
BUN/Creatinine Ratio: 12 (ref 9–23)
BUN: 12 mg/dL (ref 6–24)
Bilirubin Total: 0.3 mg/dL (ref 0.0–1.2)
CO2: 26 mmol/L (ref 20–29)
Calcium: 10 mg/dL (ref 8.7–10.2)
Chloride: 100 mmol/L (ref 96–106)
Creatinine, Ser: 1.01 mg/dL — ABNORMAL HIGH (ref 0.57–1.00)
GFR calc Af Amer: 73 mL/min/{1.73_m2} (ref >59)
GFR calc non Af Amer: 63 mL/min/{1.73_m2} (ref >59)
Globulin, Total: 2.4 g/dL (ref 1.5–4.5)
Glucose: 84 mg/dL (ref 65–99)
Potassium: 4 mmol/L (ref 3.5–5.2)
Sodium: 142 mmol/L (ref 134–144)
Total Protein: 7.3 g/dL (ref 6.0–8.5)

## 2020-08-28 LAB — HEPATITIS C ANTIBODY: Hep C Virus Ab: <0.1 s/co ratio (ref 0.0–0.9)

## 2020-08-30 ENCOUNTER — Telehealth: Payer: Self-pay

## 2020-08-30 NOTE — Telephone Encounter (Signed)
Pt returned your call about labs 

## 2020-08-31 NOTE — Telephone Encounter (Signed)
Patient informed and verbalized understanding

## 2020-09-01 ENCOUNTER — Encounter: Payer: Self-pay | Admitting: Obstetrics and Gynecology

## 2020-09-01 ENCOUNTER — Other Ambulatory Visit: Payer: Self-pay

## 2020-09-01 ENCOUNTER — Ambulatory Visit (INDEPENDENT_AMBULATORY_CARE_PROVIDER_SITE_OTHER): Payer: Managed Care, Other (non HMO) | Admitting: Obstetrics and Gynecology

## 2020-09-01 VITALS — BP 120/90 | Ht 63.0 in | Wt 167.0 lb

## 2020-09-01 DIAGNOSIS — Z1231 Encounter for screening mammogram for malignant neoplasm of breast: Secondary | ICD-10-CM

## 2020-09-01 DIAGNOSIS — Z124 Encounter for screening for malignant neoplasm of cervix: Secondary | ICD-10-CM

## 2020-09-01 DIAGNOSIS — Z8742 Personal history of other diseases of the female genital tract: Secondary | ICD-10-CM | POA: Diagnosis not present

## 2020-09-01 DIAGNOSIS — Z79890 Hormone replacement therapy (postmenopausal): Secondary | ICD-10-CM

## 2020-09-01 DIAGNOSIS — N951 Menopausal and female climacteric states: Secondary | ICD-10-CM

## 2020-09-01 DIAGNOSIS — Z1151 Encounter for screening for human papillomavirus (HPV): Secondary | ICD-10-CM

## 2020-09-01 DIAGNOSIS — Z01419 Encounter for gynecological examination (general) (routine) without abnormal findings: Secondary | ICD-10-CM

## 2020-09-01 MED ORDER — ESTRADIOL 0.025 MG/24HR TD PTWK: MEDICATED_PATCH | TRANSDERMAL | 3 refills | Status: DC

## 2020-09-01 NOTE — Patient Instructions (Signed)
I value your feedback and you entrusting us with your care. If you get a [facility] patient survey, I would appreciate you taking the time to let us know about your experience today. Thank you!  [facility]: [phone]      

## 2020-09-01 NOTE — Progress Notes (Signed)
PCP: [name], [name], MD   Chief Complaint  Patient presents with  . Gynecologic Exam    No concerns  . [employer] Employee    HPI:      [name] is a 55 y.o. G1P0010 whose LMP was No LMP recorded. Patient has had a hysterectomy., presents today for her annual examination.  Her menses are absent due to [facility] due to pelvic pain and AUB. She does not have vasomotor sx. On climara HRT patch, without sx. Decreased dose from 0.05 mg last yr to 0.0375 mg. Pt would like to try to come off and would like lower dose.   Sex activity: not sexually active. She does not have vaginal dryness.  Last Pap: 07/31/19 Results were: no abnormalities. Hx of ASCUS 2017 and 2018 with normal paps since. Hx of LGSIL in past and colpo 2016. Hx of STDs: HPV on pap  Last mammogram: 08/21/19  Results were: normal--routine follow-up in 12 months with LT breast cysts There is no FH of breast cancer. There is no FH of ovarian cancer. The patient does not do self-breast exams.  Colonoscopy: 5 yrs ago, hx of crohns. Has appt sched 3/22  Tobacco use: quit Alcohol use: none  No drug use Exercise: moderately active  She does get adequate calcium and Vitamin D in her diet.  Labs with PCP.   Past Medical History:  Diagnosis Date  . Anxiety   . ASCUS with positive high risk HPV cervical   . CAD (coronary artery disease)   . Chicken pox   . Chronic pain    neck, back, b/l hips   . Depression   . GERD (gastroesophageal reflux disease)   . History of Crohn's disease   . Hyperlipidemia   . Left ovarian cyst    s/p removal of 1 ovary ? which one removed per pt   . Libido, decreased   . Skin cancer    reports skin cancer removed from skin in 2016 or 2017  . Thyroid nodule    repeat US resolved and Ct neck 05/2019 normal thyroid     Past Surgical History:  Procedure Laterality Date  . ABDOMINAL HYSTERECTOMY    . APPENDECTOMY  2004  . BLADDER SURGERY    . BREAST BIOPSY     x 2  .  COLONOSCOPY    . OOPHORECTOMY Right    Unilateral Right (per MRI 04/17/2018)  . WISDOM TOOTH EXTRACTION      Family History  Problem Relation Age of Onset  . Alcohol abuse Mother   . Hyperlipidemia Mother   . Heart disease Mother   . Hypertension Mother   . Diabetes Mother   . Crohn's disease Sister        1/2 sister  . Depression Sister   . Osteoporosis Sister   . Leukemia Paternal Grandmother   . Cancer Paternal Aunt        blood, type unknown  . Intellectual disability Niece   . Breast cancer Neg Hx     Social History   Socioeconomic History  . Marital status: Married    Spouse name: douglas  . Number of children: 0  . Years of education: Not on file  . Highest education level: Associate degree: occupational, Hotel manager, or vocational program  Occupational History  . 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 Smoking status: Former Smoker    Types: Cigarettes    Quit date: 08/29/2000  Years since quitting: 20.0  . Smokeless tobacco: Never Used  Vaping Use  . Vaping Use: Never used  Substance and Sexual Activity  . Alcohol use: No    Alcohol/week: 0.0 standard drinks    Comment: social   . Drug use: No  . Sexual activity: Not Currently    Birth control/protection: Surgical    Comment: Hysterectomy  Other Topics Concern  . Not on file  Social History Narrative   Married Doug   Works [employer]    Social Determinants of Radio broadcast assistant Strain: Not on file  Food Insecurity: Not on file  Transportation Needs: Not on file  Physical Activity: Not on file  Stress: Not on file  Social Connections: Not on file  Intimate Partner Violence: Not on file     Current Outpatient Medications:  .  atorvastatin (LIPITOR) 10 MG tablet, Take 1 tablet (10 mg total) by mouth daily. At night, Disp: 90 tablet, Rfl: 1 .  buPROPion (WELLBUTRIN XL) 300 MG 24 hr tablet, Take 1 tablet (300 mg total) by mouth daily., Disp: 90 tablet, Rfl: 3 .   celecoxib (CELEBREX) 200 MG capsule, Take 1 capsule (200 mg total) by mouth daily. (Patient taking differently: Take 200 mg by mouth every other day.), Disp: 90 capsule, Rfl: 3 .  ciclopirox (PENLAC) 8 % solution, Apply topically at bedtime. Apply over nail and surrounding skin. Apply daily over previous coat. After seven (7) days, may remove with alcohol and continue cycle., Disp: 6.6 mL, Rfl: 5 .  clotrimazole-betamethasone (LOTRISONE) cream, Apply externally BID prn sx up to 2 wks, Disp: 15 g, Rfl: 0 .  dicyclomine (BENTYL) 10 MG capsule, Take 1 capsule (10 mg total) by mouth 3 (three) times daily before meals., Disp: 90 capsule, Rfl: 0 .  fentaNYL (DURAGESIC) 12 MCG/HR, , Disp: , Rfl:  .  lidocaine (LIDODERM) 5 %, Place 2 patches onto the skin daily. , Disp: , Rfl:  .  Multiple Vitamin (MULTI-VITAMINS) TABS, Take 1 tablet by mouth daily. , Disp: , Rfl:  .  Omega-3 Fatty Acids (FISH OIL PO), Take 1 capsule by mouth daily. , Disp: , Rfl:  .  omeprazole (PRILOSEC) 20 MG capsule, Take 1 capsule (20 mg total) by mouth daily. In am before food 30 minutes, Disp: 90 capsule, Rfl: 3 .  PEG-KCl-NaCl-NaSulf-Na Asc-C (PLENVU) 140 g SOLR, 1 kit for colonoscopy, Disp: 1 each, Rfl: 0 .  promethazine (PHENERGAN) 25 MG tablet, Take 1 tablet (25 mg total) by mouth 2 (two) times daily as needed for nausea or vomiting., Disp: 30 tablet, Rfl: 2 .  tiZANidine (ZANAFLEX) 4 MG capsule, Take 1 capsule (4 mg total) by mouth at bedtime as needed for muscle spasms., Disp: 30 capsule, Rfl: 5 .  TRINTELLIX 5 MG TABS tablet, Take 5 mg by mouth daily., Disp: , Rfl:  .  valACYclovir (VALTREX) 1000 MG tablet, Take 1 tablet (1,000 mg total) by mouth 2 (two) times daily. X 3-7 days prn, Disp: 60 tablet, Rfl: 11 .  estradiol (CLIMARA - DOSED IN MG/24 HR) 0.025 mg/24hr patch, APPLY 1 PATCH EXTERNALLY TO THE SKIN ONCE A WEEK, Disp: 12 patch, Rfl: 3     ROS:  Review of Systems  Constitutional: Negative for fatigue, fever and  unexpected weight change.  Respiratory: Negative for cough, shortness of breath and wheezing.   Cardiovascular: Negative for chest pain, palpitations and leg swelling.  Gastrointestinal: Negative for blood in stool, constipation, diarrhea, nausea and vomiting.  Endocrine: Negative for cold intolerance, heat intolerance and polyuria.  Genitourinary: Negative for dyspareunia, dysuria, flank pain, frequency, genital sores, hematuria, menstrual problem, pelvic pain, urgency, vaginal bleeding, vaginal discharge and vaginal pain.  Musculoskeletal: Negative for back pain, joint swelling and myalgias.  Skin: Negative for rash.  Neurological: Negative for dizziness, syncope, light-headedness, numbness and headaches.  Hematological: Negative for adenopathy.  Psychiatric/Behavioral: Positive for agitation and dysphoric mood. Negative for confusion, sleep disturbance and suicidal ideas. The patient is not nervous/anxious.   BREAST: No symptoms    Objective: BP 120/90   Ht $R'5\' 3"'VB$  (1.6 m)   Wt 167 lb (75.8 kg)   BMI 29.58 kg/m    Physical Exam Constitutional:      Appearance: She is well-developed.  Genitourinary:     Vulva and vagina normal.     Genitourinary Comments: UTERUS/CX SURG REM     Right Labia: No rash, tenderness or lesions.    Left Labia: No tenderness, lesions or rash.    Vaginal cuff intact.    No vaginal discharge, erythema or tenderness.      Right Adnexa: not tender and no mass present.    Left Adnexa: not tender and no mass present.    Cervix is absent.     Uterus is not tender.     Uterus is absent.  Breasts:     Right: No mass, nipple discharge, skin change or tenderness.     Left: No mass, nipple discharge, skin change or tenderness.    Neck:     Thyroid: No thyromegaly.  Cardiovascular:     Rate and Rhythm: Normal rate and regular rhythm.     Heart sounds: Normal heart sounds. No murmur heard.   Pulmonary:     Effort: Pulmonary effort is normal.     Breath  sounds: Normal breath sounds.  Abdominal:     Palpations: Abdomen is soft.     Tenderness: There is no abdominal tenderness. There is no guarding.  Musculoskeletal:        General: Normal range of motion.     Cervical back: Normal range of motion.  Neurological:     General: No focal deficit present.     Mental Status: She is alert and oriented to person, place, and time.     Cranial Nerves: No cranial nerve deficit.  Skin:    General: Skin is warm and dry.  Psychiatric:        Mood and Affect: Mood normal.        Behavior: Behavior normal.        Thought Content: Thought content normal.        Judgment: Judgment normal.  Vitals reviewed.     Assessment/Plan:  Encounter for annual routine gynecological examination  Cervical cancer screening - Plan: IGP, Aptima HPV,   Screening for HPV (human papillomavirus) - Plan: IGP, Aptima HPV,   History of abnormal cervical Pap smear - Plan: IGP, Aptima HPV, repeat pap today. Will f/u if abn.   Encounter for screening mammogram for malignant neoplasm of breast - Plan: MM 3D SCREEN BREAST BILATERAL; pt to sched mammo  Hormone replacement therapy (HRT) - Plan: estradiol (CLIMARA - DOSED IN MG/24 HR) 0.025 mg/24hr patch  Vasomotor symptoms due to menopause - Plan: estradiol (CLIMARA - DOSED IN MG/24 HR) 0.025 mg/24hr patch; sx controlled. Will decrease to 0.025 mg dose. If ok, pt to cut in half to wean off (aware this isn't scientific approach to dosing). Rx eRxd. F/u prn  Meds ordered this encounter  Medications  . estradiol (CLIMARA - DOSED IN MG/24 HR) 0.025 mg/24hr patch    Sig: APPLY 1 PATCH EXTERNALLY TO THE SKIN ONCE A WEEK    Dispense:  12 patch    Refill:  3    Order Specific Question:   Supervising Provider    Answer:   Gae Dry [[id number]]           GYN counsel breast self exam, mammography screening, use and side effects of HRT, menopause, adequate intake of calcium and vitamin D, diet and exercise     F/U  Return in about 1 year (around 09/01/2021).  Poet Hineman B. Iliani Vejar, PA-C 09/01/2020 2:51 PM

## 2020-09-05 LAB — IGP, APTIMA HPV: HPV Aptima: NEGATIVE

## 2020-10-18 ENCOUNTER — Other Ambulatory Visit: Payer: Self-pay

## 2020-10-18 ENCOUNTER — Encounter (HOSPITAL_COMMUNITY): Payer: Self-pay | Admitting: Gastroenterology

## 2020-10-21 ENCOUNTER — Other Ambulatory Visit (HOSPITAL_COMMUNITY)
Admission: RE | Admit: 2020-10-21 | Discharge: 2020-10-21 | Disposition: A | Discharge status: Home / Self Care | Payer: Managed Care, Other (non HMO) | Source: Ambulatory Visit | Attending: General Surgery | Admitting: General Surgery

## 2020-10-21 DIAGNOSIS — Z20822 Contact with and (suspected) exposure to covid-19: Secondary | ICD-10-CM | POA: Insufficient documentation

## 2020-10-21 DIAGNOSIS — Z01812 Encounter for preprocedural laboratory examination: Secondary | ICD-10-CM | POA: Insufficient documentation

## 2020-10-21 LAB — SARS CORONAVIRUS 2 (TAT 6-24 HRS): SARS Coronavirus 2: NEGATIVE

## 2020-10-23 NOTE — Anesthesia Preprocedure Evaluation (Addendum)
Anesthesia Evaluation  Patient identified by MRN, date of birth, ID band Patient awake    Reviewed: Allergy & Precautions, NPO status , Patient's Chart, lab work & pertinent test results  Airway Mallampati: II  TM Distance: >3 FB Neck ROM: Full    Dental no notable dental hx. (+) Teeth Intact, Dental Advisory Given   Pulmonary former smoker,    Pulmonary exam normal breath sounds clear to auscultation       Cardiovascular + CAD  Normal cardiovascular exam Rhythm:Regular Rate:Normal     Neuro/Psych PSYCHIATRIC DISORDERS Anxiety negative neurological ROS     GI/Hepatic Neg liver ROS, Hx of crohns dz   Endo/Other  negative endocrine ROS  Renal/GU negative Renal ROS     Musculoskeletal negative musculoskeletal ROS (+)   Abdominal   Peds  Hematology negative hematology ROS (+)   Anesthesia Other Findings   Reproductive/Obstetrics negative OB ROS                           Anesthesia Physical Anesthesia Plan  ASA: III  Anesthesia Plan: MAC   Post-op Pain Management:    Induction:   PONV Risk Score and Plan: 2 and Treatment may vary due to age or medical condition  Airway Management Planned: Natural Airway  Additional Equipment:   Intra-op Plan:   Post-operative Plan:   Informed Consent: I have reviewed the patients History and Physical, chart, labs and discussed the procedure including the risks, benefits and alternatives for the proposed anesthesia with the patient or authorized representative who has indicated his/her understanding and acceptance.     Dental advisory given  Plan Discussed with: CRNA  Anesthesia Plan Comments: (Sceening colonoscopy   Preliminary review propafol sx do not appear to be related to allergy or even propafol itself. The incidents all seem to be related to procedure that she had performed at [facility]. 2 appear related to Urinary retention secondary to  a bladder procedure and a hysterectomy. The other procedure seems to be related to palpitations after a colonoscopy.There was a negative cardiac work up also. The common denominator " propafol was used:" Patient extremely anxious.  It is impossible to know without prior records which patient did not  bring.  Pt adamant about not receiving propafol. Pt told that we can use other drugs her recovery will take longer and might be sick most likely midazolam, fentanyl, poss additionally ketamine. Wants to proceed SANS propafol. D/W Dr Silverio Decamp..)      Anesthesia Quick Evaluation

## 2020-10-24 ENCOUNTER — Other Ambulatory Visit: Payer: Self-pay | Admitting: Internal Medicine

## 2020-10-25 ENCOUNTER — Other Ambulatory Visit: Payer: Self-pay

## 2020-10-25 ENCOUNTER — Encounter (HOSPITAL_COMMUNITY)
Admission: RE | Disposition: A | Discharge status: Home / Self Care | Payer: Self-pay | Source: Home / Self Care | Attending: Gastroenterology

## 2020-10-25 ENCOUNTER — Ambulatory Visit (HOSPITAL_COMMUNITY): Payer: Managed Care, Other (non HMO)

## 2020-10-25 ENCOUNTER — Encounter (HOSPITAL_COMMUNITY): Payer: Self-pay | Admitting: Gastroenterology

## 2020-10-25 ENCOUNTER — Ambulatory Visit (HOSPITAL_COMMUNITY)
Admission: RE | Admit: 2020-10-25 | Discharge: 2020-10-25 | Disposition: A | Discharge status: Home / Self Care | Payer: Managed Care, Other (non HMO) | Attending: Gastroenterology | Admitting: Gastroenterology

## 2020-10-25 DIAGNOSIS — D120 Benign neoplasm of cecum: Secondary | ICD-10-CM | POA: Insufficient documentation

## 2020-10-25 DIAGNOSIS — Z87891 Personal history of nicotine dependence: Secondary | ICD-10-CM | POA: Diagnosis not present

## 2020-10-25 DIAGNOSIS — K635 Polyp of colon: Secondary | ICD-10-CM | POA: Diagnosis not present

## 2020-10-25 DIAGNOSIS — I2510 Atherosclerotic heart disease of native coronary artery without angina pectoris: Secondary | ICD-10-CM | POA: Diagnosis not present

## 2020-10-25 DIAGNOSIS — K648 Other hemorrhoids: Secondary | ICD-10-CM | POA: Insufficient documentation

## 2020-10-25 DIAGNOSIS — Z90710 Acquired absence of both cervix and uterus: Secondary | ICD-10-CM | POA: Insufficient documentation

## 2020-10-25 DIAGNOSIS — E785 Hyperlipidemia, unspecified: Secondary | ICD-10-CM | POA: Insufficient documentation

## 2020-10-25 DIAGNOSIS — Z8379 Family history of other diseases of the digestive system: Secondary | ICD-10-CM | POA: Diagnosis not present

## 2020-10-25 DIAGNOSIS — K581 Irritable bowel syndrome with constipation: Secondary | ICD-10-CM

## 2020-10-25 DIAGNOSIS — K5904 Chronic idiopathic constipation: Secondary | ICD-10-CM

## 2020-10-25 DIAGNOSIS — Z886 Allergy status to analgesic agent status: Secondary | ICD-10-CM | POA: Insufficient documentation

## 2020-10-25 DIAGNOSIS — Z90721 Acquired absence of ovaries, unilateral: Secondary | ICD-10-CM | POA: Diagnosis not present

## 2020-10-25 DIAGNOSIS — Z79899 Other long term (current) drug therapy: Secondary | ICD-10-CM | POA: Insufficient documentation

## 2020-10-25 DIAGNOSIS — Z1211 Encounter for screening for malignant neoplasm of colon: Secondary | ICD-10-CM | POA: Diagnosis not present

## 2020-10-25 DIAGNOSIS — K5732 Diverticulitis of large intestine without perforation or abscess without bleeding: Secondary | ICD-10-CM | POA: Diagnosis not present

## 2020-10-25 DIAGNOSIS — Z888 Allergy status to other drugs, medicaments and biological substances status: Secondary | ICD-10-CM | POA: Diagnosis not present

## 2020-10-25 DIAGNOSIS — K219 Gastro-esophageal reflux disease without esophagitis: Secondary | ICD-10-CM | POA: Insufficient documentation

## 2020-10-25 DIAGNOSIS — Z85828 Personal history of other malignant neoplasm of skin: Secondary | ICD-10-CM | POA: Insufficient documentation

## 2020-10-25 DIAGNOSIS — K644 Residual hemorrhoidal skin tags: Secondary | ICD-10-CM | POA: Insufficient documentation

## 2020-10-25 HISTORY — PX: POLYPECTOMY: SHX5525

## 2020-10-25 HISTORY — PX: HEMOSTASIS CLIP PLACEMENT: SHX6857

## 2020-10-25 HISTORY — DX: Other complications of anesthesia, initial encounter: T88.59XA

## 2020-10-25 HISTORY — PX: COLONOSCOPY WITH PROPOFOL: SHX5780

## 2020-10-25 HISTORY — PX: SUBMUCOSAL LIFTING INJECTION: SHX6855

## 2020-10-25 SURGERY — COLONOSCOPY WITH PROPOFOL
Anesthesia: Monitor Anesthesia Care

## 2020-10-25 MED ORDER — FENTANYL CITRATE (PF) 250 MCG/5ML IJ SOLN
INTRAMUSCULAR | Status: AC
  Filled 2020-10-25: qty 5

## 2020-10-25 MED ORDER — MIDAZOLAM HCL 2 MG/2ML IJ SOLN
INTRAMUSCULAR | Status: AC
  Filled 2020-10-25: qty 4

## 2020-10-25 MED ORDER — KETAMINE HCL 10 MG/ML IJ SOLN
INTRAMUSCULAR | Status: AC
  Filled 2020-10-25: qty 1

## 2020-10-25 MED ORDER — MIDAZOLAM HCL 2 MG/2ML IJ SOLN
INTRAMUSCULAR | Status: AC
  Filled 2020-10-25: qty 2

## 2020-10-25 MED ORDER — KETAMINE HCL 10 MG/ML IJ SOLN
INTRAMUSCULAR | Status: DC | PRN
  Administered 2020-10-25 (×10): 10 mg via INTRAVENOUS
  Administered 2020-10-25: 20 mg via INTRAVENOUS
  Administered 2020-10-25 (×2): 10 mg via INTRAVENOUS

## 2020-10-25 MED ORDER — SODIUM CHLORIDE 0.9 % IV SOLN: INTRAVENOUS | Status: DC

## 2020-10-25 MED ORDER — PROPOFOL 10 MG/ML IV BOLUS
INTRAVENOUS | Status: AC
  Filled 2020-10-25: qty 20

## 2020-10-25 MED ORDER — MIDAZOLAM HCL 2 MG/2ML IJ SOLN
INTRAMUSCULAR | Status: DC | PRN
  Administered 2020-10-25 (×6): 1 mg via INTRAVENOUS

## 2020-10-25 MED ORDER — LACTATED RINGERS IV SOLN: INTRAVENOUS | Status: DC | PRN

## 2020-10-25 MED ORDER — FENTANYL CITRATE (PF) 250 MCG/5ML IJ SOLN
INTRAMUSCULAR | Status: DC | PRN
  Administered 2020-10-25 (×5): 50 ug via INTRAVENOUS

## 2020-10-25 MED ORDER — LACTATED RINGERS IV SOLN: INTRAVENOUS | Status: DC

## 2020-10-25 SURGICAL SUPPLY — 21 items

## 2020-10-25 NOTE — Anesthesia Procedure Notes (Signed)
Procedure Name: MAC Date/Time: 10/25/2020 7:48 AM Performed by: Cynda Familia, CRNA Pre-anesthesia Checklist: Patient identified, Emergency Drugs available, Suction available, Patient being monitored and Timeout performed Patient Re-evaluated:Patient Re-evaluated prior to induction Oxygen Delivery Method: Simple face mask Placement Confirmation: positive ETCO2 and breath sounds checked- equal and bilateral Dental Injury: Teeth and Oropharynx as per pre-operative assessment

## 2020-10-25 NOTE — Transfer of Care (Signed)
Immediate Anesthesia Transfer of Care Note  Patient: Carolyn Esparza  Procedure(s) Performed: COLONOSCOPY WITH PROPOFOL (N/A ) POLYPECTOMY HEMOSTASIS CLIP PLACEMENT SUBMUCOSAL LIFTING INJECTION  Patient Location: PACU and Endoscopy Unit  Anesthesia Type:MAC  Level of Consciousness: awake and alert   Airway & Oxygen Therapy: Patient Spontanous Breathing and Patient connected to face mask oxygen  Post-op Assessment: Report given to RN and Post -op Vital signs reviewed and stable  Post vital signs: Reviewed and stable  Last Vitals:  Vitals Value Taken Time  BP 139/84 10/25/20 0857  Temp 36.6 C 10/25/20 0857  Pulse 73 10/25/20 0859  Resp 19 10/25/20 0859  SpO2 96 % 10/25/20 0859  Vitals shown include unvalidated device data.  Last Pain:  Vitals:   10/25/20 0857  TempSrc: Oral  PainSc: 0-No pain         Complications: No complications documented.

## 2020-10-25 NOTE — Anesthesia Postprocedure Evaluation (Signed)
Anesthesia Post Note  Patient: Carolyn Esparza  Procedure(s) Performed: COLONOSCOPY WITH PROPOFOL (N/A ) POLYPECTOMY HEMOSTASIS CLIP PLACEMENT SUBMUCOSAL LIFTING INJECTION     Patient location during evaluation: Endoscopy Anesthesia Type: MAC Level of consciousness: awake and alert Pain management: pain level controlled Vital Signs Assessment: post-procedure vital signs reviewed and stable Respiratory status: spontaneous breathing, nonlabored ventilation, respiratory function stable and patient connected to nasal cannula oxygen Cardiovascular status: blood pressure returned to baseline and stable Postop Assessment: no apparent nausea or vomiting Anesthetic complications: no   No complications documented.  Last Vitals:  Vitals:   10/25/20 0907 10/25/20 0924  BP: 116/69 138/73  Pulse: 65 74  Resp: 10 (!) 21  Temp:    SpO2: 96% 96%    Last Pain:  Vitals:   10/25/20 0924  TempSrc:   PainSc: 0-No pain                 Barnet Glasgow

## 2020-10-25 NOTE — Op Note (Signed)
[facility] Patient Name: Carolyn Esparza Procedure Date: 10/25/2020 MRN: [id number] Attending MD: Mauri Pole , MD Date of Birth: February 19, 1966 CSN: [id number] Age: 55 Admit Type: Outpatient Procedure:                Colonoscopy Indications:              Screening for colorectal malignant neoplasm Providers:                Mauri Pole, MD, Carmie End, RN,                            Brooke Person, Benetta Spar, Technician Referring MD:              Medicines:                Monitored Anesthesia Care with Versed, Fentanyl.                            Please refer to anaesthesia flow sheet for details Complications:            No immediate complications. Estimated Blood Loss:     Estimated blood loss was minimal. Procedure:                Pre-Anesthesia Assessment:                           - Prior to the procedure, a History and Physical                            was performed, and patient medications and                            allergies were reviewed. The patient's tolerance of                            previous anesthesia was also reviewed. The risks                            and benefits of the procedure and the sedation                            options and risks were discussed with the patient.                            All questions were answered, and informed consent                            was obtained. Prior Anticoagulants: The patient has                            taken no previous anticoagulant or antiplatelet                            agents. ASA Grade Assessment: II - A patient with  mild systemic disease. After reviewing the risks                            and benefits, the patient was deemed in                            satisfactory condition to undergo the procedure.                           After obtaining informed consent, the colonoscope                            was passed under direct  vision. Throughout the                            procedure, the patient's blood pressure, pulse, and                            oxygen saturations were monitored continuously. The                            PCF-H190DL ([id number] Olympus pediatric colonscope                            was introduced through the anus and advanced to the                            the terminal ileum, with identification of the                            appendiceal orifice and IC valve. The colonoscopy                            was performed without difficulty. The patient                            tolerated the procedure well. The quality of the                            bowel preparation was fair. Scope In: 7:57:59 AM Scope Out: 8:49:21 AM Scope Withdrawal Time: 0 hours 38 minutes 2 seconds  Total Procedure Duration: 0 hours 51 minutes 22 seconds  Findings:      The perianal and digital rectal examinations were normal.      A 18 mm polyp was found in the cecum. The polyp was granular lateral       spreading. Preparations were made for mucosal resection. Orise gel was       injected with adequate demarcation and lift of the lesion from the       muscularis propria. Hot snare mucosal resection with suction (via the       working channel) retrieval was performed. A 12 mm area was resected.       Resection and retrieval were complete. To close a defect after mucosal       resection, three hemostatic clips were successfully placed (  MR       conditional). There was no bleeding during, or at the end, of the       procedure.      The terminal ileum appeared normal.      Scattered small and large-mouthed diverticula were found in the sigmoid       colon, descending colon, transverse colon and ascending colon.      Non-bleeding external and internal hemorrhoids were found during       retroflexion. The hemorrhoids were medium-sized. Impression:               - Preparation of the colon was fair.                            - One 18 mm polyp in the cecum, removed with                            mucosal resection. Resected and retrieved. Clips                            (MR conditional) were placed.                           - The examined portion of the ileum was normal.                           - Severe diverticulosis in the sigmoid colon, in                            the descending colon, in the transverse colon and                            in the ascending colon.                           - Non-bleeding external and internal hemorrhoids.                           - Mucosal resection was performed. Resection and                            retrieval were complete. Moderate Sedation:      Not Applicable - Patient had care per Anesthesia. Recommendation:           - Patient has a contact number available for                            emergencies. The signs and symptoms of potential                            delayed complications were discussed with the                            patient. Return to normal activities tomorrow.  Written discharge instructions were provided to the                            patient.                           - Clears today and advance to soft diet for a week                            then resume previous diet                           - Continue present medications.                           - Await pathology results.                           - Repeat colonoscopy in 3 years for surveillance                            based on pathology results. Procedure Code(s):        --- Professional ---                           [phone], Colonoscopy, flexible; with endoscopic                            mucosal resection Diagnosis Code(s):        --- Professional ---                           Z12.11, Encounter for screening for malignant                            neoplasm of colon                           K64.8, Other hemorrhoids                            K63.5, Polyp of colon                           K57.30, Diverticulosis of large intestine without                            perforation or abscess without bleeding CPT copyright 2019 [employer]. All rights reserved. The codes documented in this report are preliminary and upon coder review may  be revised to meet current compliance requirements. Mauri Pole, MD 10/25/2020 9:10:52 AM This report has been signed electronically. Number of Addenda: 0

## 2020-10-25 NOTE — Discharge Instructions (Signed)

## 2020-10-25 NOTE — H&P (Addendum)
[facility] History and Physical   Primary Care Physician:  [name], Pasty Spillers, MD   Reason for Procedure:  Colorectal cancer screening  Plan:    Colonoscopy with possible intervention     HPI: Carolyn Esparza is a 55 y.o. female here for screening colonoscopy. Denies any nausea, vomiting, abdominal pain, melena or bright red blood per rectum  The risks and benefits as well as alternatives of endoscopic procedure(s) have been discussed and reviewed. All questions answered. The patient agrees to proceed.    Past Medical History:  Diagnosis Date  . Anxiety   . ASCUS with positive high risk HPV cervical   . CAD (coronary artery disease)   . Chicken pox   . Chronic pain    neck, back, b/l hips   . Complication of anesthesia   . Depression   . GERD (gastroesophageal reflux disease)   . History of Crohn's disease   . Hyperlipidemia   . Left ovarian cyst    s/p removal of 1 ovary ? which one removed per pt   . Libido, decreased   . Skin cancer    reports skin cancer removed from skin in 2016 or 2017  . Thyroid nodule    repeat US resolved and Ct neck 05/2019 normal thyroid     Past Surgical History:  Procedure Laterality Date  . ABDOMINAL HYSTERECTOMY    . APPENDECTOMY  2004  . BLADDER SURGERY    . BREAST BIOPSY     x 2  . COLONOSCOPY    . OOPHORECTOMY Right    Unilateral Right (per MRI 04/17/2018)  . WISDOM TOOTH EXTRACTION      Prior to Admission medications   Medication Sig Start Date End Date Taking? Authorizing Provider  atorvastatin (LIPITOR) 10 MG tablet Take 1 tablet (10 mg total) by mouth daily. At night 03/30/20  Yes [name], Pasty Spillers, MD  buPROPion (WELLBUTRIN XL) 300 MG 24 hr tablet Take 1 tablet (300 mg total) by mouth daily. 01/26/20  Yes [name], Pasty Spillers, MD  celecoxib (CELEBREX) 200 MG capsule Take 1 capsule (200 mg total) by mouth daily. Patient taking differently: Take 200 mg by mouth every other day. 04/12/20  Yes  [name], Pasty Spillers, MD  estradiol (CLIMARA - DOSED IN MG/24 HR) 0.025 mg/24hr patch APPLY 1 PATCH EXTERNALLY TO THE SKIN ONCE A WEEK Patient taking differently: Place 0.025 mg onto the skin once a week. APPLY 1 PATCH EXTERNALLY TO THE SKIN ONCE A WEEK 09/01/20  Yes Copland, Alicia B, PA-C  fentaNYL (DURAGESIC) 12 MCG/HR Place 1 patch onto the skin every 3 (three) days. 12/31/18  Yes [provider]  HYDROcodone-acetaminophen (NORCO) 10-325 MG tablet Take 1 tablet by mouth daily as needed for severe pain. 08/05/20  Yes [provider]  lidocaine (LIDODERM) 5 % Place 2 patches onto the skin daily.    Yes [provider]  Multiple Vitamin (MULTI-VITAMINS) TABS Take 1 tablet by mouth daily.    Yes [provider]  Omega-3 Fatty Acids (FISH OIL PO) Take 1 capsule by mouth daily.    Yes [provider]  omeprazole (PRILOSEC) 20 MG capsule Take 1 capsule (20 mg total) by mouth daily. In am before food 30 minutes 04/14/20  Yes [name], Pasty Spillers, MD  promethazine (PHENERGAN) 25 MG tablet Take 1 tablet (25 mg total) by mouth 2 (two) times daily as needed for nausea or vomiting. 11/01/18  Yes [name], Pasty Spillers, MD  tiZANidine (ZANAFLEX) 4 MG capsule Take  1 capsule (4 mg total) by mouth at bedtime as needed for muscle spasms. 04/12/20  Yes [name], Pasty Spillers, MD  TRINTELLIX 5 MG TABS tablet Take 5 mg by mouth 2 (two) times a week. 09/08/19  Yes [provider]  ciclopirox (PENLAC) 8 % solution Apply topically at bedtime. Apply over nail and surrounding skin. Apply daily over previous coat. After seven (7) days, may remove with alcohol and continue cycle. Patient not taking: No sig reported 04/09/20   [name], Pasty Spillers, MD  clotrimazole-betamethasone (LOTRISONE) cream Apply externally BID prn sx up to 2 wks Patient not taking: No sig reported 10/16/19   Copland, Alicia B, PA-C  dicyclomine (BENTYL) 10 MG capsule Take 1 capsule (10 mg total)  by mouth 3 (three) times daily before meals. Patient not taking: No sig reported 08/06/20   Napoleon Form, MD  PEG-KCl-NaCl-NaSulf-Na Asc-C (PLENVU) 140 g SOLR 1 kit for colonoscopy Patient not taking: No sig reported 08/06/20   Napoleon Form, MD  valACYclovir (VALTREX) 1000 MG tablet Take 1 tablet (1,000 mg total) by mouth 2 (two) times daily. X 3-7 days prn Patient taking differently: Take 1,000 mg by mouth 2 (two) times daily as needed (outbreaks). X 3-7 days prn 10/29/19   [name], Pasty Spillers, MD    Current Facility-Administered Medications  Medication Dose Route Frequency Provider Last Rate Last Admin  . 0.9 %  sodium chloride infusion   Intravenous Continuous Yuto Cajuste V, MD      . lactated ringers infusion   Intravenous Continuous Napoleon Form, MD        Allergies as of 08/06/2020 - Review Complete 08/06/2020  Allergen Reaction Noted  . Propofol Palpitations 12/13/2015  . Nsaids Other (See Comments) 12/13/2015  . Pentasa [mesalamine] Other (See Comments) 12/13/2015    Family History  Problem Relation Age of Onset  . Alcohol abuse Mother   . Hyperlipidemia Mother   . Heart disease Mother   . Hypertension Mother   . Diabetes Mother   . Crohn's disease Sister        1/2 sister  . Depression Sister   . Osteoporosis Sister   . Leukemia Paternal Grandmother   . Cancer Paternal Aunt        blood, type unknown  . Intellectual disability Niece   . Breast cancer Neg Hx     Social History   Socioeconomic History  . Marital status: Married    Spouse name: douglas  . Number of children: 0  . Years of education: Not on file  . Highest education level: Associate degree: occupational, Scientist, product/process development, or vocational program  Occupational History  . Occupation: Visual merchandiser    Comment: full time  Tobacco Use  . Smoking status: Former Smoker    Types: Cigarettes    Quit date: 08/29/2000    Years since quitting: 20.1  . Smokeless tobacco: Never Used   Vaping Use  . Vaping Use: Never used  Substance and Sexual Activity  . Alcohol use: No    Alcohol/week: 0.0 standard drinks    Comment: social   . Drug use: No  . Sexual activity: Not Currently    Birth control/protection: Surgical    Comment: Hysterectomy  Other Topics Concern  . Not on file  Social History Narrative   Married Doug   Works [employer]    Social Determinants of Corporate investment banker Strain: Not on file  Food Insecurity: Not on file  Transportation Needs: Not on  file  Physical Activity: Not on file  Stress: Not on file  Social Connections: Not on file  Intimate Partner Violence: Not on file    Review of Systems:  All other review of systems negative except as mentioned in the HPI.  Physical Exam: Vital signs in last 24 hours: Temp:  [98 F (36.7 C)] 98 F (36.7 C) (03/28 0657) Pulse Rate:  [68] 68 (03/28 0657) Resp:  [13] 13 (03/28 0657) BP: (126)/(69) 126/69 (03/28 0657) SpO2:  [96 %] 96 % (03/28 0657) Weight:  [67.6 kg] 67.6 kg (03/28 0657)   General:   Alert, NAD Lungs:  Clear .   Heart:  Regular rate and rhythm Abdomen:  Soft, nontender and nondistended. Neuro/Psych:  Alert and cooperative. Normal mood and affect. A and O x 3   K. Denzil Magnuson , MD [phone]

## 2020-10-26 ENCOUNTER — Other Ambulatory Visit: Payer: Self-pay

## 2020-10-26 ENCOUNTER — Encounter: Payer: Self-pay | Admitting: Gastroenterology

## 2020-10-26 LAB — SURGICAL PATHOLOGY

## 2020-10-26 MED ORDER — PROMETHAZINE HCL 25 MG PO TABS: 25.0000 mg | ORAL_TABLET | Freq: Four times a day (QID) | ORAL | 0 refills | Status: DC | PRN

## 2020-10-27 ENCOUNTER — Encounter (HOSPITAL_COMMUNITY): Payer: Self-pay | Admitting: Gastroenterology

## 2020-10-31 ENCOUNTER — Other Ambulatory Visit: Payer: Self-pay | Admitting: Internal Medicine

## 2020-10-31 DIAGNOSIS — E785 Hyperlipidemia, unspecified: Secondary | ICD-10-CM

## 2020-11-25 ENCOUNTER — Ambulatory Visit
Admission: RE | Admit: 2020-11-25 | Discharge: 2020-11-25 | Disposition: A | Discharge status: Home / Self Care | Payer: Managed Care, Other (non HMO) | Source: Ambulatory Visit | Attending: Obstetrics and Gynecology | Admitting: Obstetrics and Gynecology

## 2020-11-25 ENCOUNTER — Other Ambulatory Visit: Payer: Self-pay

## 2020-11-25 DIAGNOSIS — Z1231 Encounter for screening mammogram for malignant neoplasm of breast: Secondary | ICD-10-CM

## 2020-11-30 ENCOUNTER — Encounter: Payer: Self-pay | Admitting: Internal Medicine

## 2020-11-30 ENCOUNTER — Other Ambulatory Visit: Payer: Self-pay

## 2020-11-30 ENCOUNTER — Ambulatory Visit (INDEPENDENT_AMBULATORY_CARE_PROVIDER_SITE_OTHER): Payer: Managed Care, Other (non HMO) | Admitting: Internal Medicine

## 2020-11-30 VITALS — BP 104/72 | HR 95 | Temp 97.6°F | Ht 63.0 in | Wt 148.8 lb

## 2020-11-30 DIAGNOSIS — M545 Low back pain, unspecified: Secondary | ICD-10-CM

## 2020-11-30 DIAGNOSIS — G8929 Other chronic pain: Secondary | ICD-10-CM

## 2020-11-30 DIAGNOSIS — R110 Nausea: Secondary | ICD-10-CM

## 2020-11-30 DIAGNOSIS — M542 Cervicalgia: Secondary | ICD-10-CM

## 2020-11-30 DIAGNOSIS — Z1389 Encounter for screening for other disorder: Secondary | ICD-10-CM

## 2020-11-30 DIAGNOSIS — M25551 Pain in right hip: Secondary | ICD-10-CM | POA: Diagnosis not present

## 2020-11-30 DIAGNOSIS — K219 Gastro-esophageal reflux disease without esophagitis: Secondary | ICD-10-CM

## 2020-11-30 DIAGNOSIS — B009 Herpesviral infection, unspecified: Secondary | ICD-10-CM

## 2020-11-30 DIAGNOSIS — M7062 Trochanteric bursitis, left hip: Secondary | ICD-10-CM

## 2020-11-30 DIAGNOSIS — R739 Hyperglycemia, unspecified: Secondary | ICD-10-CM

## 2020-11-30 DIAGNOSIS — M25552 Pain in left hip: Secondary | ICD-10-CM

## 2020-11-30 DIAGNOSIS — E785 Hyperlipidemia, unspecified: Secondary | ICD-10-CM | POA: Diagnosis not present

## 2020-11-30 DIAGNOSIS — M7061 Trochanteric bursitis, right hip: Secondary | ICD-10-CM

## 2020-11-30 DIAGNOSIS — E663 Overweight: Secondary | ICD-10-CM

## 2020-11-30 DIAGNOSIS — Z1231 Encounter for screening mammogram for malignant neoplasm of breast: Secondary | ICD-10-CM

## 2020-11-30 DIAGNOSIS — Z1329 Encounter for screening for other suspected endocrine disorder: Secondary | ICD-10-CM

## 2020-11-30 DIAGNOSIS — Z0000 Encounter for general adult medical examination without abnormal findings: Secondary | ICD-10-CM

## 2020-11-30 MED ORDER — OMEPRAZOLE 20 MG PO CPDR: 20.0000 mg | DELAYED_RELEASE_CAPSULE | Freq: Every day | ORAL | 3 refills | Status: DC

## 2020-11-30 MED ORDER — PROMETHAZINE HCL 25 MG PO TABS: 25.0000 mg | ORAL_TABLET | Freq: Every day | ORAL | 5 refills | Status: DC | PRN

## 2020-11-30 MED ORDER — TIZANIDINE HCL 4 MG PO CAPS: 4.0000 mg | ORAL_CAPSULE | Freq: Every evening | ORAL | 5 refills | Status: DC | PRN

## 2020-11-30 MED ORDER — VALACYCLOVIR HCL 1 G PO TABS: ORAL_TABLET | ORAL | 11 refills | Status: DC

## 2020-11-30 NOTE — Progress Notes (Signed)
Chief Complaint  Patient presents with  . Hip Pain   F/u  1. Chronic hip pain had Radiofreq ablation multiple times and steroids injections with Dr. Maryanna Shape pain clinic most recently and helped left hip pain but has right hip ongoing pain at times walks like frankenstein and tried epsolm salt bath and bengay which helps Pain 6-8/10 she has chronic pain meds with the pain clinic fenatyl patch 12 mcg/hr. Tried lidoderm/salonpas She wants referral to PT  We reviewed dry needling and asked she disc with pain clinic before consider with PT  Also established with [facility] ortho Dr. Roland Rack and maybe hip surgery but may require less invasive approach and he gave her list of names who do this and she will check in her my chart messages from him and contact me if referral needed   2. Overweight now on keto diet and doing mct oil which is like a coconut oil  Which she started at end of 07/2020, 08/2020 will check lipid panel disc this can make worse keto diet Goal wt is 125/130 exercise limited due to #1   3. 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 Chronic nausea intermittently wants refill phenerghan which helps  Review of Systems  Constitutional: Positive for weight loss.  HENT: Negative for hearing loss.   Eyes: Negative for blurred vision.  Respiratory: Negative for shortness of breath.   Cardiovascular: Negative for chest pain.  Gastrointestinal: Negative for abdominal pain.  Musculoskeletal: Positive for joint pain.  Skin: Negative for rash.  Neurological: Negative for headaches.  Psychiatric/Behavioral: Negative for depression.   Past Medical History:  Diagnosis Date  . Anxiety   . ASCUS with positive high risk HPV cervical   . CAD (coronary artery disease)   . Chicken pox   . Chronic pain    neck, back, b/l hips   . Complication of anesthesia   . Depression   . GERD (gastroesophageal reflux disease)   . History of Crohn's disease   . Hyperlipidemia   . Left ovarian cyst    s/p  removal of 1 ovary ? which one removed per pt   . Libido, decreased   . Skin cancer    reports skin cancer removed from skin in 2016 or 2017  . Thyroid nodule    repeat US resolved and Ct neck 05/2019 normal thyroid    Past Surgical History:  Procedure Laterality Date  . ABDOMINAL HYSTERECTOMY    . APPENDECTOMY  2004  . BLADDER SURGERY    . BREAST BIOPSY     x 2  . COLONOSCOPY    . COLONOSCOPY WITH PROPOFOL N/A 10/25/2020   Procedure: COLONOSCOPY WITH PROPOFOL;  Surgeon: Mauri Pole, MD;  Location: [facility];  Service: Endoscopy;  Laterality: N/A;  . HEMOSTASIS CLIP PLACEMENT  10/25/2020   Procedure: HEMOSTASIS CLIP PLACEMENT;  Surgeon: Mauri Pole, MD;  Location: [facility];  Service: Endoscopy;;  . OOPHORECTOMY Right    Unilateral Right (per MRI 04/17/2018)  . POLYPECTOMY  10/25/2020   Procedure: POLYPECTOMY;  Surgeon: Mauri Pole, MD;  Location: [facility];  Service: Endoscopy;;  . SUBMUCOSAL LIFTING INJECTION  10/25/2020   Procedure: SUBMUCOSAL LIFTING INJECTION;  Surgeon: Mauri Pole, MD;  Location: [facility];  Service: Endoscopy;;  . WISDOM TOOTH EXTRACTION     Family History  Problem Relation Age of Onset  . Alcohol abuse Mother   . Hyperlipidemia Mother   . Heart disease Mother   .  Hypertension Mother   . Diabetes Mother   . Crohn's disease Sister        1/2 sister  . Depression Sister   . Osteoporosis Sister   . Leukemia Paternal Grandmother   . Cancer Paternal Aunt        blood, type unknown  . Intellectual disability Niece   . Breast cancer Neg Hx    Social History   Socioeconomic History  . Marital status: Married    Spouse name: douglas  . Number of children: 0  . Years of education: Not on file  . Highest education level: Associate degree: occupational, Hotel manager, or vocational program  Occupational History  . 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 Smoking status: Former Smoker    Types:  Cigarettes    Quit date: 08/29/2000    Years since quitting: 20.2  . Smokeless tobacco: Never Used  Vaping Use  . Vaping Use: Never used  Substance and Sexual Activity  . Alcohol use: No    Alcohol/week: 0.0 standard drinks    Comment: social   . Drug use: No  . Sexual activity: Not Currently    Birth control/protection: Surgical    Comment: Hysterectomy  Other Topics Concern  . Not on file  Social History Narrative   Married Doug   Works [employer]    Social Determinants of Radio broadcast assistant Strain: Not on file  Food Insecurity: Not on file  Transportation Needs: Not on file  Physical Activity: Not on file  Stress: Not on file  Social Connections: Not on file  Intimate Partner Violence: Not on file   Current Meds  Medication Sig  . atorvastatin (LIPITOR) 10 MG tablet TAKE 1 TABLET(10 MG) BY MOUTH DAILY AT NIGHT  . buPROPion (WELLBUTRIN XL) 300 MG 24 hr tablet TAKE 1 TABLET(300 MG) BY MOUTH DAILY  . celecoxib (CELEBREX) 200 MG capsule Take 1 capsule (200 mg total) by mouth daily. (Patient taking differently: Take 200 mg by mouth every other day.)  . estradiol (CLIMARA - DOSED IN MG/24 HR) 0.025 mg/24hr patch APPLY 1 PATCH EXTERNALLY TO THE SKIN ONCE A WEEK (Patient taking differently: Place 0.025 mg onto the skin once a week. APPLY 1 PATCH EXTERNALLY TO THE SKIN ONCE A WEEK)  . fentaNYL (DURAGESIC) 12 MCG/HR Place 1 patch onto the skin every 3 (three) days.  Marland Kitchen HYDROcodone-acetaminophen (NORCO) 10-325 MG tablet Take 1 tablet by mouth daily as needed for severe pain.  Marland Kitchen lidocaine (LIDODERM) 5 % Place 2 patches onto the skin daily.   . Medium Chain Triglycerides (MCT OIL PO) Take by mouth.  . Multiple Vitamin (MULTI-VITAMINS) TABS Take 1 tablet by mouth daily.   . Omega-3 Fatty Acids (FISH OIL PO) Take 1 capsule by mouth daily.   . promethazine (PHENERGAN) 25 MG tablet Take 1 tablet (25 mg total) by mouth 2 (two) times daily as needed for nausea or vomiting.  .  TRINTELLIX 5 MG TABS tablet Take 5 mg by mouth 2 (two) times a week.  . [DISCONTINUED] omeprazole (PRILOSEC) 20 MG capsule Take 1 capsule (20 mg total) by mouth daily. In am before food 30 minutes  . [DISCONTINUED] tiZANidine (ZANAFLEX) 4 MG capsule Take 1 capsule (4 mg total) by mouth at bedtime as needed for muscle spasms.  . [DISCONTINUED] valACYclovir (VALTREX) 1000 MG tablet Take 1 tablet (1,000 mg total) by mouth 2 (two) times daily. X 3-7 days prn (Patient taking differently:  Take 1,000 mg by mouth 2 (two) times daily as needed (outbreaks). X 3-7 days prn)   Allergies  Allergen Reactions  . Propofol Palpitations    BP drops and EKG changes, bladder retention  . Nsaids Other (See Comments)    Flare of Crohn's.  . Pentasa [Mesalamine] Other (See Comments)    Chest pain.   Recent Results (from the past 2160 hour(s))  SARS CORONAVIRUS 2 (TAT 6-24 HRS) Nasopharyngeal Nasopharyngeal Swab     Status: None   Collection Time: 10/21/20 10:53 AM   Specimen: Nasopharyngeal Swab  Result Value Ref Range   SARS Coronavirus 2 NEGATIVE NEGATIVE    Comment: (NOTE) SARS-CoV-2 target nucleic acids are NOT DETECTED.  The SARS-CoV-2 RNA is generally detectable in upper and lower respiratory specimens during the acute phase of infection. Negative results do not preclude SARS-CoV-2 infection, do not rule out co-infections with other pathogens, and should not be used as the sole basis for treatment or other patient management decisions. Negative results must be combined with clinical observations, patient history, and epidemiological information. The expected result is Negative.  Fact Sheet for Patients: SugarRoll.be  Fact Sheet for Healthcare Providers: https://www.woods-mathews.com/  This test is not yet approved or cleared by the [place] FDA and  has been authorized for detection and/or diagnosis of SARS-CoV-2 by FDA under an Emergency Use  Authorization (EUA). This EUA will remain  in effect (meaning this test can be used) for the duration of the COVID-19 declaration under Se ction 564(b)(1) of the Act, 21 U.S.C. section 360bbb-3(b)(1), unless the authorization is terminated or revoked sooner.  Performed at [facility], [place] [address] [place], [place] [address]   Surgical pathology     Status: None   Collection Time: 10/25/20  8:19 AM  Result Value Ref Range   SURGICAL PATHOLOGY      SURGICAL PATHOLOGY CASE: [id number] PATIENT: [name] Pathology Report     Clinical History: Screening colon; diverticulitis, hemorrhoid (crm)     FINAL MICROSCOPIC DIAGNOSIS:  A. COLON, CECUM, POLYPECTOMY: - Sessile serrated polyp without cytologic dysplasia.   GROSS DESCRIPTION:  Received in formalin are tan, soft tissue fragments that are submitted in toto. Number: Multiple Size: 0.3 to 0.7 cm Blocks: 1 Craig Staggers 10/25/2020)    Final Diagnosis performed by Gillie Manners, MD.   Electronically signed 10/26/2020 Technical component performed at [facility], [place] [address] [place], [place] [address].  Professional component performed at [employer], [place] [address], [place], [place] [address].  Immunohistochemistry Technical component (if applicable) was performed at [facility]. [address] [place], [place], [place] [address].   IMMUNOHISTOCHEMISTRY DISCLAIMER (if applicable):  Some of these immunohistochemical stains may have been developed and the performance characteristics determine by [facility]. Some may not have been cleared or approved by the U.S. Food and Drug Administration. The FDA has determined that such clearance or approval is not necessary. This test is used for clinical purposes. It should not be regarded as investigational or for research. This laboratory is certified under the [place] (CLIA-88) as qualified to perform high complexity clinical laboratory testing.  The controls stained appropriately.   goal wt 125/130 lbs  Objective  Body mass index is 26.36 kg/m. Wt Readings from Last 3 Encounters:  11/30/20 148 lb 12.8 oz (67.5 kg)  10/25/20 149 lb (67.6 kg)  09/01/20 167 lb (75.8 kg)   Temp Readings  from Last 3 Encounters:  11/30/20 97.6 F (36.4 C) (Oral)  10/25/20 97.8 F (36.6 C) (Oral)  04/09/20 98.2 F (36.8 C) (Oral)   BP Readings from Last 3 Encounters:  11/30/20 104/72  10/25/20 138/73  09/01/20 120/90   Pulse Readings from Last 3 Encounters:  11/30/20 95  10/25/20 74  08/06/20 78    Physical Exam Vitals and nursing note reviewed.  Constitutional:      Appearance: Normal appearance. She is well-developed, well-groomed and overweight.  HENT:     Head: Normocephalic and atraumatic.  Cardiovascular:     Rate and Rhythm: Normal rate and regular rhythm.     Heart sounds: Normal heart sounds. No murmur heard.   Pulmonary:     Effort: Pulmonary effort is normal.     Breath sounds: Normal breath sounds.  Musculoskeletal:     Right hip: Bony tenderness present.     Left hip: Bony tenderness present.  Skin:    General: Skin is warm and dry.  Neurological:     General: No focal deficit present.     Mental Status: She is alert and oriented to person, place, and time. Mental status is at baseline.     Gait: Gait normal.  Psychiatric:        Attention and Perception: Attention and perception normal.        Mood and Affect: Mood and affect normal.        Speech: Speech normal.        Behavior: Behavior normal. Behavior is cooperative.        Thought Content: Thought content normal.        Cognition and Memory: Cognition and memory normal.        Judgment: Judgment normal.     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 Roland Rack does not do less invasive surgery per pt  Radiofreq ablation multiple times and steroids injections with Dr. Maryanna Shape pain clinic most recently and helped left hip pain but has right hip  pain clinic fenatyl patch 12 mcg/hr. Tried lidoderm/salonpas/bengay/epsolm salt baths  We reviewed dry needling and asked she disc with pain clinic before consider with PT  Also established with [facility] ortho Dr. Roland Rack and maybe hip surgery but may require less invasive approach and he gave her list of names who do this and she will check in her my chart messages from him and contact me if referral needed    Overweight  Hyperlipidemia, unspecified hyperlipidemia type - Plan: Lipid panel now and in 04/16/21 with comp labs rec healthy diet and exercise on keto diet by choice   Herpes infection - Plan: valACYclovir (VALTREX) 1000 MG tablet-2 pills day 1 and 1 pill bid x 3-7 days until resolved   Chronic nausea - Plan: promethazine (PHENERGAN) 25 MG tablet  Gastroesophageal reflux disease without esophagitis - Plan: omeprazole (PRILOSEC) 20 MG capsule   HM Given labs [facility] to be done fasting as of 04/16/21  Flu shot utd hep B3/3 vaccineshadimmune shingrixhad2/2 3/3 moderna covid 19 vaccines consider 4th dose utd Tdap rec MMR  Sp partial hysterectomy. H/o Pap ASCUS 07/19/17 f/u with Dr. Garnette Czech 07/22/18 negative except yeastnow with [facility] Pap 72/53/66 normal except yeast  Colonoscopy ptprevwants to hold as disc prev visits will keep encouraging given h/o Crohns per hx, FH  polpys  -cant tolerate proprofolbut tolerate fenatyl and versed Dr. Silverio Decamp [facility] GI 10/25/20 serrated polyps repeat 3 years  [place] [facility] [place] normal Ordered for 2022 mammogram 11/25/20 neg  Dermatology saw recently Dr. Kellie Moor 08/2019 normal no bx's; no bxs in 2022   DEXA  01/28/19 normal   Former smoker quit in 2002   Thyroid nodule noted on problem listshe did have in the past but resolved after repeat thyroid US andCT scan 05/06/19 negativethyroid nodules   Specialists:  [facility] Dr. Maryanna Shape pain clinic  [facility] ortho Cards Dr. Ubaldo Glassing GI in past [place]>[facility] GI Dr. Burna Mortimer as of 2/68/34 Ob/gyn [facility]  pulm leb osa  Provider: Dr. Olivia Mackie [name]-Internal Medicine

## 2020-11-30 NOTE — Patient Instructions (Addendum)
Ask pain clinic about dry needling if they think this will help you Let me if you need an ortho referral   Trigger Point Dry Needling   What is Trigger Point Dry Needling (DN)?   1. DN is a physical therapy technique used to treat muscle pain and     Dysfunction.  Specifically, DN helps deactivate muscle trigger    points (Muscle Knots).   2. A thin filiform needle is used to penetrate the skin and stimulate    the underlying trigger point.  The goal is for a local twitch     response (LTR) to occur and for the trigger point to relax.  No    medication of any kind is injected during the procedure.   What Does Trigger Point Dry Needling Feel Like?   1. The procedures feels different for each individual patient.   Some    patients report that they do not actually feel the      needle enter the skin and overall the process is not  painful.  Very    mild bleeding may occur.  However, many patients feel a deep    cramping in the muscle in which the needle was inserted. This is t   he local twitch response.    How Will I Feel After The Treatment?   1. Soreness is normal, and the onset of soreness may not occur for    a few hours.  Typically this soreness does not last longer than two    days.   2. Bruising is uncommon, however; ice can be used to decrease any    possible bruising.   3. In rare cases feeling tired or nauseous after the treatment is    normal.  In addition, your symptoms may get worse before they    get better, this period will typically not last longer than 24 hours.   What Can I do After My Treatment?   1.  Increase your hydration by drinking more water for the next 24    hours.   2.  You may place ice or heat on the areas treated that have become    sore, however don not use heat on inflamed or bruised areas.     Heat often brings more relief post needling.   3. You can continue your regular activities, but vigorous activity is    not recommended initially after the treatment for  24 hours.   4. DN is best combined with other physical therapy such as     strengthening, stretching, and other therapies.     Hip Exercises Ask your health care provider which exercises are safe for you. Do exercises exactly as told by your health care provider and adjust them as directed. It is normal to feel mild stretching, pulling, tightness, or discomfort as you do these exercises. Stop right away if you feel sudden pain or your pain gets worse. Do not begin these exercises until told by your health care provider. Stretching and range-of-motion exercises These exercises warm up your muscles and joints and improve the movement and flexibility of your hip. These exercises also help to relieve pain, numbness, and tingling. You may be asked to limit your range of motion if you had a hip replacement. Talk to your health care provider about these restrictions. Hamstrings, supine 1. Lie on your back (supine position). 2. Loop a belt or towel over the ball of your left / right foot. The ball of  your foot is on the walking surface, right under your toes. 3. Straighten your left / right knee and slowly pull on the belt or towel to raise your leg until you feel a gentle stretch behind your knee (hamstring). ? Do not let your knee bend while you do this. ? Keep your other leg flat on the floor. 4. Hold this position for __________ seconds. 5. Slowly return your leg to the starting position. Repeat __________ times. Complete this exercise __________ times a day.   Hip rotation 1. Lie on your back on a firm surface. 2. With your left / right hand, gently pull your left / right knee toward the shoulder that is on the same side of the body. Stop when your knee is pointing toward the ceiling. 3. Hold your left / right ankle with your other hand. 4. Keeping your knee steady, gently pull your left / right ankle toward your other shoulder until you feel a stretch in your buttocks. ? Keep your hips and  shoulders firmly planted while you do this stretch. 5. Hold this position for __________ seconds. Repeat __________ times. Complete this exercise __________ times a day.   Seated stretch This exercise is sometimes called hamstrings and adductors stretch. 1. Sit on the floor with your legs stretched wide. Keep your knees straight during this exercise. 2. Keeping your head and back in a straight line, bend at your waist to reach for your left foot (position A). You should feel a stretch in your right inner thigh (adductors). 3. Hold this position for __________ seconds. Then slowly return to the upright position. 4. Keeping your head and back in a straight line, bend at your waist to reach forward (position B). You should feel a stretch behind both of your thighs and knees (hamstrings). 5. Hold this position for __________ seconds. Then slowly return to the upright position. 6. Keeping your head and back in a straight line, bend at your waist to reach for your right foot (position C). You should feel a stretch in your left inner thigh (adductors). 7. Hold this position for __________ seconds. Then slowly return to the upright position. Repeat __________ times. Complete this exercise __________ times a day.   Lunge This exercise stretches the muscles of the hip (hip flexors). 1. Place your left / right knee on the floor and bend your other knee so that is directly over your ankle. You should be half-kneeling. 2. Keep good posture with your head over your shoulders. 3. Tighten your buttocks to point your tailbone downward. This will prevent your back from arching too much. 4. You should feel a gentle stretch in the front of your left / right thigh and hip. If you do not feel a stretch, slide your other foot forward slightly and then slowly lunge forward with your chest up until your knee once again lines up over your ankle. ? Make sure your tailbone continues to point downward. 5. Hold this position  for __________ seconds. 6. Slowly return to the starting position. Repeat __________ times. Complete this exercise __________ times a day.   Strengthening exercises These exercises build strength and endurance in your hip. Endurance is the ability to use your muscles for a long time, even after they get tired. Bridge This exercise strengthens the muscles of your hip (hip extensors). 1. Lie on your back on a firm surface with your knees bent and your feet flat on the floor. 2. Tighten your buttocks muscles and lift your  bottom off the floor until the trunk of your body and your hips are level with your thighs. ? Do not arch your back. ? You should feel the muscles working in your buttocks and the back of your thighs. If you do not feel these muscles, slide your feet 1-2 inches (2.5-5 cm) farther away from your buttocks. 3. Hold this position for __________ seconds. 4. Slowly lower your hips to the starting position. 5. Let your muscles relax completely between repetitions. Repeat __________ times. Complete this exercise __________ times a day.   Straight leg raises, side-lying This exercise strengthens the muscles that move the hip joint away from the center of the body (hip abductors). 1. Lie on your side with your left / right leg in the top position. Lie so your head, shoulder, hip, and knee line up. You may bend your bottom knee slightly to help you balance. 2. Roll your hips slightly forward, so your hips are stacked directly over each other and your left / right knee is facing forward. 3. Leading with your heel, lift your top leg 4-6 inches (10-15 cm). You should feel the muscles in your top hip lifting. ? Do not let your foot drift forward. ? Do not let your knee roll toward the ceiling. 4. Hold this position for __________ seconds. 5. Slowly return to the starting position. 6. Let your muscles relax completely between repetitions. Repeat __________ times. Complete this exercise  __________ times a day.   Straight leg raises, side-lying This exercise strengthens the muscles that move the hip joint toward the center of the body (hip adductors). 1. Lie on your side with your left / right leg in the bottom position. Lie so your head, shoulder, hip, and knee line up. You may place your upper foot in front to help you balance. 2. Roll your hips slightly forward, so your hips are stacked directly over each other and your left / right knee is facing forward. 3. Tense the muscles in your inner thigh and lift your bottom leg 4-6 inches (10-15 cm). 4. Hold this position for __________ seconds. 5. Slowly return to the starting position. 6. Let your muscles relax completely between repetitions. Repeat __________ times. Complete this exercise __________ times a day.   Straight leg raises, supine This exercise strengthens the muscles in the front of your thigh (quadriceps). 1. Lie on your back (supine position) with your left / right leg extended and your other knee bent. 2. Tense the muscles in the front of your left / right thigh. You should see your kneecap slide up or see increased dimpling just above your knee. 3. Keep these muscles tight as you raise your leg 4-6 inches (10-15 cm) off the floor. Do not let your knee bend. 4. Hold this position for __________ seconds. 5. Keep these muscles tense as you lower your leg. 6. Relax the muscles slowly and completely between repetitions. Repeat __________ times. Complete this exercise __________ times a day.   Hip abductors, standing This exercise strengthens the muscles that move the leg and hip joint away from the center of the body (hip abductors). 1. Tie one end of a rubber exercise band or tubing to a secure surface, such as a chair, table, or pole. 2. Loop the other end of the band or tubing around your left / right ankle. 3. Keeping your ankle with the band or tubing directly opposite the secured end, step away until there is  tension in the tubing or band.  Hold on to a chair, table, or pole as needed for balance. 4. Lift your left / right leg out to your side. While you do this: ? Keep your back upright. ? Keep your shoulders over your hips. ? Keep your toes pointing forward. ? Make sure to use your hip muscles to slowly lift your leg. Do not tip your body or forcefully lift your leg. 5. Hold this position for __________ seconds. 6. Slowly return to the starting position. Repeat __________ times. Complete this exercise __________ times a day. Squats This exercise strengthens the muscles in the front of your thigh (quadriceps). 1. Stand in a door frame so your feet and knees are in line with the frame. You may place your hands on the frame for balance. 2. Slowly bend your knees and lower your hips like you are going to sit in a chair. ? Keep your lower legs in a straight-up-and-down position. ? Do not let your hips go lower than your knees. ? Do not bend your knees lower than told by your health care provider. ? If your hip pain increases, do not bend as low. 3. Hold this position for ___________ seconds. 4. Slowly push with your legs to return to standing. Do not use your hands to pull yourself to standing. Repeat __________ times. Complete this exercise __________ times a day. This information is not intended to replace advice given to you by your health care provider. Make sure you discuss any questions you have with your health care provider. Document Revised: 02/20/2019 Document Reviewed: 05/28/2018 [employer] Patient Education  2021 [employer].

## 2020-11-30 NOTE — Progress Notes (Signed)
Ongoing hip pain in both hips. Seen pain management and had injections in both hips. Only helped the left hip. Still having pain in the right hip.   Would like a referral to physical therapy

## 2020-12-06 ENCOUNTER — Encounter: Payer: Self-pay | Admitting: Internal Medicine

## 2020-12-07 ENCOUNTER — Encounter: Payer: Self-pay | Admitting: Internal Medicine

## 2020-12-07 LAB — LIPID PANEL
Chol/HDL Ratio: 3 ratio (ref 0.0–4.4)
Cholesterol, Total: 163 mg/dL (ref 100–199)
HDL: 55 mg/dL (ref >39)
LDL Chol Calc (NIH): 88 mg/dL (ref 0–99)
Triglycerides: 111 mg/dL (ref 0–149)
VLDL Cholesterol Cal: 20 mg/dL (ref 5–40)

## 2020-12-07 NOTE — Telephone Encounter (Signed)
-----   Message from Delorise Jackson, MD sent at 12/07/2020  7:40 AM EDT ----- Cholesterol improved  [place] news

## 2020-12-07 NOTE — Addendum Note (Signed)
Addended by: Orland Mustard on: 12/07/2020 04:56 PM   Modules accepted: Orders

## 2020-12-13 ENCOUNTER — Ambulatory Visit: Payer: Managed Care, Other (non HMO) | Attending: Internal Medicine | Admitting: Physical Therapy

## 2020-12-13 ENCOUNTER — Other Ambulatory Visit: Payer: Self-pay

## 2020-12-13 ENCOUNTER — Encounter: Payer: Self-pay | Admitting: Physical Therapy

## 2020-12-13 DIAGNOSIS — M545 Low back pain, unspecified: Secondary | ICD-10-CM | POA: Insufficient documentation

## 2020-12-13 DIAGNOSIS — R262 Difficulty in walking, not elsewhere classified: Secondary | ICD-10-CM

## 2020-12-13 DIAGNOSIS — M6281 Muscle weakness (generalized): Secondary | ICD-10-CM | POA: Diagnosis present

## 2020-12-13 DIAGNOSIS — G8929 Other chronic pain: Secondary | ICD-10-CM | POA: Diagnosis present

## 2020-12-13 DIAGNOSIS — M25551 Pain in right hip: Secondary | ICD-10-CM | POA: Diagnosis not present

## 2020-12-13 DIAGNOSIS — M25552 Pain in left hip: Secondary | ICD-10-CM

## 2020-12-13 DIAGNOSIS — M62838 Other muscle spasm: Secondary | ICD-10-CM | POA: Insufficient documentation

## 2020-12-13 NOTE — Therapy (Signed)
[facility] [address], [place], [address] Phone: [phone]   Fax:  [phone]  Physical Therapy Evaluation  Patient Details  Name: Carolyn Esparza MRN: [id number] Date of Birth: 1966-04-14 Referring Provider (PT): [name], [name], MD   Encounter Date: 12/13/2020   PT End of Session - 12/13/20 2011    Visit Number 1    Number of Visits 24    Date for PT Re-Evaluation 03/07/21    Authorization Type [employer] reporting period from 12/13/2020    Authorization Time Period 60 visits Per calendar year. Pre auth IS req after 5th visit  verified by Sherlie Ban [id number]    Authorization - Visit Number 1    Authorization - Number of Visits 60    Progress Note Due on Visit 10    PT Start Time 1815    PT Stop Time 1905    PT Time Calculation (min) 50 min    Activity Tolerance Patient tolerated treatment well;Patient limited by pain    Behavior During Therapy [facility] for tasks assessed/performed           Past Medical History:  Diagnosis Date  . Anxiety   . ASCUS with positive high risk HPV cervical   . CAD (coronary artery disease)   . Chicken pox   . Chronic pain    neck, back, b/l hips   . Complication of anesthesia   . Depression   . GERD (gastroesophageal reflux disease)   . History of Crohn's disease   . Hyperlipidemia   . Left ovarian cyst    s/p removal of 1 ovary ? which one removed per pt   . Libido, decreased   . Skin cancer    reports skin cancer removed from skin in 2016 or 2017  . Thyroid nodule    repeat US resolved and Ct neck 05/2019 normal thyroid     Past Surgical History:  Procedure Laterality Date  . ABDOMINAL HYSTERECTOMY    . APPENDECTOMY  2004  . BLADDER SURGERY    . BREAST BIOPSY     x 2  . COLONOSCOPY    . COLONOSCOPY WITH PROPOFOL N/A 10/25/2020   Procedure: COLONOSCOPY WITH PROPOFOL;  Surgeon: Mauri Pole, MD;  Location: [facility];  Service: Endoscopy;   Laterality: N/A;  . HEMOSTASIS CLIP PLACEMENT  10/25/2020   Procedure: HEMOSTASIS CLIP PLACEMENT;  Surgeon: Mauri Pole, MD;  Location: [facility];  Service: Endoscopy;;  . OOPHORECTOMY Right    Unilateral Right (per MRI 04/17/2018)  . POLYPECTOMY  10/25/2020   Procedure: POLYPECTOMY;  Surgeon: Mauri Pole, MD;  Location: [facility];  Service: Endoscopy;;  . SUBMUCOSAL LIFTING INJECTION  10/25/2020   Procedure: SUBMUCOSAL LIFTING INJECTION;  Surgeon: Mauri Pole, MD;  Location: [facility];  Service: Endoscopy;;  . WISDOM TOOTH EXTRACTION      There were no vitals filed for this visit.    Subjective Assessment - 12/13/20 1831    Subjective Patient states her condition has been going on for quite some time. She thinks the exercises she got in PT from about 3 years ago helped her but she didn't continue to do them. She goes to the pain clinic in [facility] and she feels that her pain correlates to the amount of activity she does, so she tries to limit her activity. She has had several RF (radiofrequency ablation) procedures (targeted at the back) and in the past they have been very  helpful in reliving her hip, back, and neck pain for 6 months or so. The last one did not help at all. She went back in March to get cortisone injections which were in the right and the left. The left one helped but the right one continues to hurt and she cannot sleep on the right side. When it gets really bad she can only sleep more than 2 hours and is woken up every 2 hours.  She tried heat, ice. She puts lidocaine patches and lays on left hip and ice pack on the right hip in order to go back to sleep. She also uses narcotic pain medication and muscle relaxer. She noticed when she performed IR to shave her legs it caused a lot of pain (both sides). Epson salt bath helps (she did right before today's session). Her pain is currently 6/10 and when it gets up to 8/10 she walks "like Frankenstein." Has  a history of back pain with gradual onset. Hip pain was gradual onset as well. She has been following the Keto diet since the end of January 2022. It is going well so far. Also struggles with chronic low back pain but currently over the last few days also feels okay. Doesn't necessary correlate with hip pain.    Pertinent History Patient is a 55 y.o. female who presents to outpatient physical therapy with a referral for medical diagnosis chronic pain of both hips, pain of both hip joints, low back pain, unspecified back pain laterality, unspecified chronicity, unspecified whether sciatica present,  trochanteric bursitis of both hips. This patient's chief complaints consist of chronic bilateral hip pain R > L in setting of chronic low back pain leading to the following functional deficits: difficulty with ADLs, IADLs, activities that require weight bearing, walking, standing, stairs lifting, shaving legs, sleeping and decreased quality of life.   Relevant past medical history and comorbidities include chronic back pain, chronic neck pain, hypermobile in several joints, GERD, fatigue, depression/anxiety, CAD (smallest artery in heart - no stent recommended - sees cardiologist no restrictions), skin cancer (cleared up a few years ago).  Patient denies hx of stroke, seizures, lung problem, major cardiac events, diabetes, unexplained weight loss, changes in bowel or bladder problems, stumbling or dropping things, spinal surgeries, osteoporosis.    Limitations Standing;Walking;House hold activities   difficulty with ADLs, IADLs, activities that require weight bearing, walking, standing, stairs lifting, shaving legs, sleeping and decreased quality of life.   Diagnostic tests R&L hip MRI report 04/18/2018: "IMPRESSION:  1. On the right side, there is a partially torn distal gluteus  medius tendon with considerable tendinopathy in the gluteus medius  and mild gluteus minimus tendinopathy, as well as adjacent  trochanteric  bursitis.  2. There is also a small right anterior superior labral tear which  appears nondisplaced.  3. On the left side there is distal gluteus medius moderate  tendinopathy and also mild proximal hamstring tendinopathy along  with mild trochanteric bursitis.  4. Spondylosis and degenerative disc disease at L5-S1.  5. Multiple chronic small cystic lesions along the vaginal cuff.  6. Sigmoid colon diverticulosis."    Currently in Pain? Yes    Pain Score 6    W: 8/10; B: 4/10 (R) 2/10 (L)   Pain Location Hip    Pain Orientation Right;Left;Lateral   posterior lateral glute. Left only tender to touch currently   Pain Descriptors / Indicators Aching   lateral posterior glute region bilaterally   Pain Type Chronic pain  Pain Radiating Towards right lateral thigh to knee. Denies numbness/tingling (did have paresthesia in the past down both legs R > L, right low back pain was worse)    Pain Onset More than a month ago    Pain Frequency Constant    Aggravating Factors  activity, walking, standing, stairs, lifting, shaving legs, turning legs    Pain Relieving Factors heat, ice, lidocaine patches, epson salt bath, medications, sitting and doing nothing    Effect of Pain on Daily Activities activity, walking, standing, stairs lifting, shaving legs            [facility] PT Assessment - 12/13/20 0001      Assessment   Medical Diagnosis chronic pain of both hips, pain of both hip joints, low back pain, unspecified back pain laterality, unspecified chronicity, unspecified whether sciatica present,  trochanteric bursitis of both hips    Referring Provider (PT) [name], [name], MD    Onset Date/Surgical Date --   chronic   Prior Therapy PT for low back, hips, knees with mixed success      Prior Function   Level of Independence Independent    Vocation Full time employment    Vocation Requirements desk work    Leisure used to be able to hike, garden, exercise including yoga, help husband with  household tasks. Now unable or limited due to pain and activity intolerance.       Cognition   Overall Cognitive Status Within Functional Limits for tasks assessed           OBJECTIVE: OBSERVATION/INSPECTION: Patient presents with slight left lateral shift in the spine in standing. Low muscle mass noted throughout B LE  Lumbar AROM:  *Indicates pain - Flexion: = palms to floor, elbows flexed slightly, no significant effect at low back or hips. - Extension: = WFL except right glute pain - Rotation: B = WFL except a little tight in the right low back - Side Flexion: B = WFL except lower back pain on right.   PERIPHERAL JOINT MOTION (AROM/PROM in degrees):  Hip  - Flexion: R = 130/145, L = 130/145. (tissue approximation bilaterally  With flexion) - Extension: R = /40, L = /40 - External rotation (at 90 degrees flexion): R = /90, L = /90. - Internal rotation (at 90 degrees flexion): R = ~45, L = ~45. Knee - Flexion: B appears WFL - Extension: B appear to have genurcurvatum of > 10 degrees Ankle:  - B ankles appear generally WFL    STRENGTH:  *Indicates pain Hip  - Flexion: R = 4/5, L = 4/5. - Extension: R = 4/5, L = 4/5. - Abduction: R = 3+/5 painful, L = 4/5. - Adduction: Flex: R = 2/5, L = NT. Ankle - Able to toe and heel walk with BUE support.   SPECIAL TESTS: ER derotation test: R = positive, L = positive. (implicates greater trochanteric pain syndrome) FADDIR: R = pain at lateral hip/glute, L = negative. Hip Scour: R = negative, L = negative. FABER: R = negative, L = negative (knee contacts mat before reaching end range bilaterally). Beighton scale: 7/9 (implicates generalized hypermobility)  ACCESSORY MOTION:  - CPA to lumbar spine not painful and does not reproduce concordant pain - CPA to sacrum mildly uncomfortable  PALPATION: - TTP at right greater trochanter, posterior hip and superior gluteal fibers, right SIJ - TTP left glute med and greater  trochanter  FUNCTIONAL MOBILITY: - Bed mobility: pain at R hip when laying  on R side, otherwise supine <> sit and rolling I. - Transfers: sit <> stand I - Gait: currently [facility] for basic mobility for household and short community distances. More detailed analysis to be performed at later date as appropriate.   FUNCTIONAL/BALANCE TESTS: Single leg stance, firm surface, eyes open: R= <5 seconds, L= <5 seconds  EDUCATION/COGNITION: Patient is alert and oriented X 4.   Objective measurements completed on examination: See above findings.      PT Education - 12/13/20 2010    Education Details exam technique/purpose. Education on diagnosis, prognosis, POC, anatomy and physiology of current condition    Person(s) Educated Patient    Methods Explanation;Demonstration;Tactile cues;Verbal cues    Comprehension Verbalized understanding;Returned demonstration;Verbal cues required;Tactile cues required;Need further instruction            PT Short Term Goals - 12/13/20 2027      PT SHORT TERM GOAL #1   Title Be independent with initial home exercise program for self-management of symptoms.    Baseline Initial HEP to be provided at visit 2 (12/13/2020);    Time 3    Period Weeks    Status New    Target Date 01/03/21             PT Long Term Goals - 12/13/20 2028      PT LONG TERM GOAL #1   Title Be independent with a long-term home exercise program for self-management of symptoms.    Baseline Initial HEP to be provided at visit 2 (12/13/2020);    Time 12    Period Weeks    Status New   TARGET DATE FOR ALL LONG TERM GOALS: 03/07/2021     PT LONG TERM GOAL #2   Title Demonstrate improved FOTO score by 10 units to demonstrate improvement in overall condition and self-reported functional ability.    Baseline to be completed visit 2 as appropriate (12/13/2020);    Time 12    Period Weeks    Status New      PT LONG TERM GOAL #3   Title Improve B hip strength to 4+/5 without increase in  pain for improved ability to allow patient to complete valued functional tasks such as stairs and walking with less difficulty.    Baseline weak and painful - see objective exam (12/13/2020);    Time 12    Period Weeks    Status New      PT LONG TERM GOAL #4   Title Patient will demonstrate single leg stance balance on firm surface with eyes open of equal or greater than 30 seconds each side to demonstrate improved proprioception and balance for functional activities such as walking, stairs, community and household mobility tasks.    Baseline < 5 seconds both sides (12/13/2020);    Time 12    Period Weeks    Status New      PT LONG TERM GOAL #5   Title Complete community, work and/or recreational activities without limitation due to current condition.    Baseline Functional Limitations:difficulty with ADLs, IADLs, activities that require weight bearing, walking, standing, stairs lifting, shaving legs, sleeping, hiking (12/13/2020);    Time 12    Period Weeks    Status New                  Plan - 12/13/20 2045    Clinical Impression Statement Patient is a 55 y.o. female referred to outpatient physical therapy with a medical diagnosis of chronic pain  of both hips, pain of both hip joints, low back pain, unspecified back pain laterality, unspecified chronicity, unspecified whether sciatica present,  trochanteric bursitis of both hips who presents with signs and symptoms consistent with chronic bilateral greater trochanteric pain syndrome, R > L in the setting of chronic low back pain with past radicular symptoms. Patient also demonstrates hypermobility in several joints scoring 7/9 on the Beighton Scale, suggesting generalized hypermobility. She demonstrated decreased strength at the hips, can balance on one leg for less than 5 seconds, is positive for ER derotation test bilaterally, and has concordant pain with palpation over glute muscles and greater trochanter, which implicate greater  trochanteric pain syndrome likely caused by gluteal tendionopathy.  Special tests for intra-articular pathology of the hip were negative, decreasing likelihood of this being a significant contributor to pain. Concordant hip pain was not reproduced with lumbar ROM or spring testing, decreasing likelihood that hip pain is primarily referred form the back. Patient presents with significant pain, ROM, joint hypermobility, motor control, muscle performance (strength/power/endurance), muscle tension, balance, proprioceptive, and activity tolerance  impairments that are limiting ability to complete her usual activities such as ADLs, IADLs, activities that require weight bearing, walking, standing, stairs lifting, shaving legs, sleeping without difficulty and decrease her quality of life. Patient will benefit from skilled physical therapy intervention to address current body structure impairments and activity limitations to improve function and work towards goals set in current POC in order to return to prior level of function or maximal functional improvement.    Personal Factors and Comorbidities Comorbidity 3+;Time since onset of injury/illness/exacerbation;Past/Current Experience;Fitness    Comorbidities Relevant past medical history and comorbidities include chronic back pain, chronic neck pain, hypermobile in several joints, GERD, fatigue, depression/anxiety, CAD (smallest artery in heart - no stent recommended - sees cardiologist no restrictions), skin cancer (cleared up a few years ago), former smoker.    Examination-Activity Limitations Squat;Stairs;Lift;Locomotion Level;Stand;Caring for Others;Carry;Sleep    Examination-Participation Restrictions Laundry;Cleaning;Shop;Community Activity;Occupation;Interpersonal Relationship    Stability/Clinical Decision Making Evolving/Moderate complexity    Clinical Decision Making Moderate    Rehab Potential Good    PT Frequency 2x / week    PT Duration 12 weeks    PT  Treatment/Interventions ADLs/Self Care Home Management;Aquatic Therapy;Biofeedback;Electrical Stimulation;Cryotherapy;Moist Heat;Gait training;Stair training;Functional mobility training;Therapeutic activities;Therapeutic exercise;Balance training;Neuromuscular re-education;Patient/family education;Manual techniques;Passive range of motion;Taping;Dry needling;Spinal Manipulations;Joint Manipulations;Other (comment);Energy conservation    PT Next Visit Plan estalish HEP, LE strengthening as tolerated    PT Home Exercise Plan TBD    Consulted and Agree with Plan of Care Patient           Patient will benefit from skilled therapeutic intervention in order to improve the following deficits and impairments:  Improper body mechanics,Pain,Decreased mobility,Hypermobility,Increased muscle spasms,Postural dysfunction,Decreased activity tolerance,Decreased endurance,Decreased strength,Impaired perceived functional ability,Difficulty walking,Abnormal gait,Decreased balance  Visit Diagnosis: Pain in right hip  Pain in left hip  Chronic bilateral low back pain, unspecified whether sciatica present  Difficulty in walking, not elsewhere classified  Muscle weakness (generalized)  Other muscle spasm     Problem List Patient Active Problem List   Diagnosis Date Noted  . Trochanteric bursitis of both hips 11/30/2020  . Special screening for malignant neoplasms, colon   . Polyp of cecum   . Pain in both feet 04/12/2020  . Burning sensation of feet 04/12/2020  . Pain of both hip joints 04/12/2020  . Onychomycosis 04/12/2020  . Fibrocystic breast disease (FCBD) 04/12/2020  . Numbness in both legs 09/09/2019  . Spinal  stenosis in cervical region 05/09/2019  . Facet arthropathy, cervical 05/09/2019  . Chronic pain 11/27/2018  . Obesity (BMI 30.0-34.9) 11/27/2018  . Sprain and strain of hip and thigh 05/10/2018  . Abnormal MRI, cervical spine 03/28/2018  . Menopause 11/26/2017  . Right upper  quadrant pain 11/26/2017  . Abnormal Pap smear of cervix 07/27/2017  . Vasomotor flushing 07/19/2017  . Depression 07/18/2017  . Nausea 06/25/2017  . Chronic pain of both hips 06/25/2017  . OSA (obstructive sleep apnea) 04/18/2017  . Annual physical exam 04/10/2017  . Fatigue 12/16/2016  . Chronic back pain 05/30/2016  . Abdominal pain 03/12/2016  . Anxiety and depression 12/28/2015  . History of obstructive sleep apnea 12/15/2015  . GERD (gastroesophageal reflux disease) 12/15/2015  . Hyperlipidemia 12/15/2015  . Pain medication agreement signed 03/25/2015  . CAD in native artery 02/05/2015  . Right supraspinatus tenosynovitis 08/17/2014  . Crohn's disease involving terminal ileum ([place]) 03/30/2014  . FH: colon polyps 03/30/2014  . 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 Other bursitis disorders 07/12/2012  . Pain in joint, pelvic region and thigh 07/12/2012  . Crohn's disease ([place]) 01/23/2012    Everlean Alstrom. Graylon Good, PT, DPT 12/13/20, 8:49 PM  [facility] [address] [place], [address] Phone: [phone]   Fax:  [phone]  Name: Carolyn Esparza MRN: [id number] Date of Birth: February 02, 1966

## 2020-12-15 ENCOUNTER — Encounter: Payer: Self-pay | Admitting: Physical Therapy

## 2020-12-15 ENCOUNTER — Ambulatory Visit: Payer: Managed Care, Other (non HMO) | Admitting: Physical Therapy

## 2020-12-15 ENCOUNTER — Other Ambulatory Visit: Payer: Self-pay

## 2020-12-15 DIAGNOSIS — M25551 Pain in right hip: Secondary | ICD-10-CM | POA: Diagnosis not present

## 2020-12-15 DIAGNOSIS — M62838 Other muscle spasm: Secondary | ICD-10-CM

## 2020-12-15 DIAGNOSIS — M545 Low back pain, unspecified: Secondary | ICD-10-CM

## 2020-12-15 DIAGNOSIS — M6281 Muscle weakness (generalized): Secondary | ICD-10-CM

## 2020-12-15 DIAGNOSIS — G8929 Other chronic pain: Secondary | ICD-10-CM

## 2020-12-15 DIAGNOSIS — K635 Polyp of colon: Secondary | ICD-10-CM

## 2020-12-15 DIAGNOSIS — M25552 Pain in left hip: Secondary | ICD-10-CM

## 2020-12-15 DIAGNOSIS — R262 Difficulty in walking, not elsewhere classified: Secondary | ICD-10-CM

## 2020-12-15 NOTE — Therapy (Signed)
[facility] [address], [place], [address] Phone: [phone]   Fax:  [phone]  Physical Therapy Treatment  Patient Details  Name: Carolyn Esparza MRN: [id number] Date of Birth: 1966-02-01 Referring Provider (PT): [name], [name], MD   Encounter Date: 12/15/2020   PT End of Session - 12/15/20 1351    Visit Number 2    Number of Visits 24    Date for PT Re-Evaluation 03/07/21    Authorization Type [employer] reporting period from 12/13/2020    Authorization Time Period 60 visits Per calendar year. Pre auth IS req after 5th visit  verified by Sherlie Ban [id number]    Authorization - Visit Number 2    Authorization - Number of Visits 60    Progress Note Due on Visit 10    PT Start Time [id number]    PT Stop Time 1425    PT Time Calculation (min) 40 min    Activity Tolerance Patient tolerated treatment well;Patient limited by pain    Behavior During Therapy [facility] for tasks assessed/performed           Past Medical History:  Diagnosis Date  . Anxiety   . ASCUS with positive high risk HPV cervical   . CAD (coronary artery disease)   . Chicken pox   . Chronic pain    neck, back, b/l hips   . Complication of anesthesia   . Depression   . GERD (gastroesophageal reflux disease)   . History of Crohn's disease   . Hyperlipidemia   . Left ovarian cyst    s/p removal of 1 ovary ? which one removed per pt   . Libido, decreased   . Skin cancer    reports skin cancer removed from skin in 2016 or 2017  . Thyroid nodule    repeat US resolved and Ct neck 05/2019 normal thyroid     Past Surgical History:  Procedure Laterality Date  . ABDOMINAL HYSTERECTOMY    . APPENDECTOMY  2004  . BLADDER SURGERY    . BREAST BIOPSY     x 2  . COLONOSCOPY    . COLONOSCOPY WITH PROPOFOL N/A 10/25/2020   Procedure: COLONOSCOPY WITH PROPOFOL;  Surgeon: Mauri Pole, MD;  Location: [facility];  Service: Endoscopy;   Laterality: N/A;  . HEMOSTASIS CLIP PLACEMENT  10/25/2020   Procedure: HEMOSTASIS CLIP PLACEMENT;  Surgeon: Mauri Pole, MD;  Location: [facility];  Service: Endoscopy;;  . OOPHORECTOMY Right    Unilateral Right (per MRI 04/17/2018)  . POLYPECTOMY  10/25/2020   Procedure: POLYPECTOMY;  Surgeon: Mauri Pole, MD;  Location: [facility];  Service: Endoscopy;;  . SUBMUCOSAL LIFTING INJECTION  10/25/2020   Procedure: SUBMUCOSAL LIFTING INJECTION;  Surgeon: Mauri Pole, MD;  Location: [facility];  Service: Endoscopy;;  . WISDOM TOOTH EXTRACTION      There were no vitals filed for this visit.   Subjective Assessment - 12/15/20 1348    Subjective Patient reports her right hip is bothering her a bit more today and rates her current pain 6/10 in the right hip, left hip 2/10, back 4/10 (lower right). States she felt okay the day after PT eval and does not think the eval irritated it. She  took her dog for a walk and irritated her right hip today. Also noticed her back pain made her grumpy when trying to cook yesterday. Prolonged standing seemed to irritate it.    Pertinent History Patient  is a 55 y.o. female who presents to outpatient physical therapy with a referral for medical diagnosis chronic pain of both hips, pain of both hip joints, low back pain, unspecified back pain laterality, unspecified chronicity, unspecified whether sciatica present,  trochanteric bursitis of both hips. This patient's chief complaints consist of chronic bilateral hip pain R > L in setting of chronic low back pain leading to the following functional deficits: difficulty with ADLs, IADLs, activities that require weight bearing, walking, standing, stairs lifting, shaving legs, sleeping and decreased quality of life.   Relevant past medical history and comorbidities include chronic back pain, chronic neck pain, hypermobile in several joints, GERD, fatigue, depression/anxiety, CAD (smallest artery in heart -  no stent recommended - sees cardiologist no restrictions), skin cancer (cleared up a few years ago).  Patient denies hx of stroke, seizures, lung problem, major cardiac events, diabetes, unexplained weight loss, changes in bowel or bladder problems, stumbling or dropping things, spinal surgeries, osteoporosis.    Limitations Standing;Walking;House hold activities   difficulty with ADLs, IADLs, activities that require weight bearing, walking, standing, stairs lifting, shaving legs, sleeping and decreased quality of life.   Diagnostic tests R&L hip MRI report 04/18/2018: "IMPRESSION:  1. On the right side, there is a partially torn distal gluteus  medius tendon with considerable tendinopathy in the gluteus medius  and mild gluteus minimus tendinopathy, as well as adjacent  trochanteric bursitis.  2. There is also a small right anterior superior labral tear which  appears nondisplaced.  3. On the left side there is distal gluteus medius moderate  tendinopathy and also mild proximal hamstring tendinopathy along  with mild trochanteric bursitis.  4. Spondylosis and degenerative disc disease at L5-S1.  5. Multiple chronic small cystic lesions along the vaginal cuff.  6. Sigmoid colon diverticulosis."    Currently in Pain? Yes    Pain Score 6     Pain Onset More than a month ago              [facility] PT Assessment - 12/15/20 0001      Assessment   Medical Diagnosis chronic pain of both hips, pain of both hip joints, low back pain, unspecified back pain laterality, unspecified chronicity, unspecified whether sciatica present,  trochanteric bursitis of both hips    Referring Provider (PT) [name], [name], MD    Onset Date/Surgical Date --   chronic   Prior Therapy PT for low back, hips, knees with mixed success      Balance Screen   Has the patient fallen in the past 6 months No    Has the patient had a decrease in activity level because of a fear of falling?  No    Is the patient reluctant to  leave their home because of a fear of falling?  No      Prior Function   Level of Independence Independent    Vocation Full time employment    Vocation Requirements desk work    Leisure used to be able to hike, garden, exercise including yoga, help husband with household tasks. Now unable or limited due to pain and activity intolerance.       Cognition   Overall Cognitive Status Within Functional Limits for tasks assessed      Observation/Other Assessments   Focus on Therapeutic Outcomes (FOTO)  31 (12/15/2020);           OBJECTIVE FOTO = 31 (12/15/2020);    Therapeutic exercise:to centralize symptoms and improve ROM  and strength required for successful completion of functional activities. - hooklying hip abduction against black theraband, 1x5, 5 second hold (right hip pain) - hooklying isometric hip abduction against gait belt, 1x20, 5 second holds (more comfortable) - hooklying isometric hip adduction against yoga block longways between knees 1x20, 5 second holds - hooklying bridge on heels with wide stance, 2x10, 5 second holds.  - isometric wall sit, 5x10 seconds (last two with red theraball on wall).  - single leg stance, 2x30 seconds each side.  - Education on HEP including handout   Pt required multimodal cuing for proper technique and to facilitate improved neuromuscular control, strength, range of motion, and functional ability resulting in improved performance and form.   HOME EXERCISE PROGRAM Access Code: [email or web address] URL: [email or web address] Date: 12/15/2020 Prepared by: Norton Blizzard  Exercises Hooklying Isometric Hip Abduction with Belt - 1 x daily - 1 sets - 20 reps - 5 second hold hold Supine Hip Adduction Isometric with Ball - 1 x daily - 1 sets - 20 reps - 5 seconds hold Bridge on Heels - 1 x daily - 2 sets - 10 reps - 5 seconds hold    PT Education - 12/15/20 1350    Education Details interventio/excercise technique/purpose. self-management  techniques    Person(s) Educated Patient    Methods Explanation;Demonstration;Tactile cues;Verbal cues;Handout    Comprehension Verbalized understanding;Returned demonstration;Verbal cues required;Tactile cues required;Need further instruction            PT Short Term Goals - 12/13/20 2027      PT SHORT TERM GOAL #1   Title Be independent with initial home exercise program for self-management of symptoms.    Baseline Initial HEP to be provided at visit 2 (12/13/2020);    Time 3    Period Weeks    Status New    Target Date 01/03/21             PT Long Term Goals - 12/13/20 2028      PT LONG TERM GOAL #1   Title Be independent with a long-term home exercise program for self-management of symptoms.    Baseline Initial HEP to be provided at visit 2 (12/13/2020);    Time 12    Period Weeks    Status New   TARGET DATE FOR ALL LONG TERM GOALS: 03/07/2021     PT LONG TERM GOAL #2   Title Demonstrate improved FOTO score by 10 units to demonstrate improvement in overall condition and self-reported functional ability.    Baseline to be completed visit 2 as appropriate (12/13/2020);    Time 12    Period Weeks    Status New      PT LONG TERM GOAL #3   Title Improve B hip strength to 4+/5 without increase in pain for improved ability to allow patient to complete valued functional tasks such as stairs and walking with less difficulty.    Baseline weak and painful - see objective exam (12/13/2020);    Time 12    Period Weeks    Status New      PT LONG TERM GOAL #4   Title Patient will demonstrate single leg stance balance on firm surface with eyes open of equal or greater than 30 seconds each side to demonstrate improved proprioception and balance for functional activities such as walking, stairs, community and household mobility tasks.    Baseline < 5 seconds both sides (12/13/2020);    Time 12    Period Weeks  Status New      PT LONG TERM GOAL #5   Title Complete community, work  and/or recreational activities without limitation due to current condition.    Baseline Functional Limitations:difficulty with ADLs, IADLs, activities that require weight bearing, walking, standing, stairs lifting, shaving legs, sleeping, hiking (12/13/2020);    Time 12    Period Weeks    Status New                 Plan - 12/15/20 2009    Clinical Impression Statement Patient tolerated treatment well overall and reported quick fatigue and no lasting increase in pain for exercises completed. PT was reminded at end of session that pain center had recommended dry needling and it was not completed today in leu of establishing initial HEP and starting on strengthening as tolerated to address likely tendinopathy and strength deficits. Plan to complete dry needling next session. Patient would benefit from continued management of limiting condition by skilled physical therapist to address remaining impairments and functional limitations to work towards stated goals and return to PLOF or maximal functional independence.    Personal Factors and Comorbidities Comorbidity 3+;Time since onset of injury/illness/exacerbation;Past/Current Experience;Fitness    Comorbidities Relevant past medical history and comorbidities include chronic back pain, chronic neck pain, hypermobile in several joints, GERD, fatigue, depression/anxiety, CAD (smallest artery in heart - no stent recommended - sees cardiologist no restrictions), skin cancer (cleared up a few years ago), former smoker.    Examination-Activity Limitations Squat;Stairs;Lift;Locomotion Level;Stand;Caring for Others;Carry;Sleep    Examination-Participation Restrictions Laundry;Cleaning;Shop;Community Activity;Occupation;Interpersonal Relationship    Stability/Clinical Decision Making Evolving/Moderate complexity    Rehab Potential Good    PT Frequency 2x / week    PT Duration 12 weeks    PT Treatment/Interventions ADLs/Self Care Home Management;Aquatic  Therapy;Biofeedback;Electrical Stimulation;Cryotherapy;Moist Heat;Gait training;Stair training;Functional mobility training;Therapeutic activities;Therapeutic exercise;Balance training;Neuromuscular re-education;Patient/family education;Manual techniques;Passive range of motion;Taping;Dry needling;Spinal Manipulations;Joint Manipulations;Other (comment);Energy conservation    PT Next Visit Plan updated HEP as appropriate, LE strengthening as tolerated, dry needling    PT Home Exercise Plan Medbridge Access Code: [email or web address]    Consulted and Agree with Plan of Care Patient           Patient will benefit from skilled therapeutic intervention in order to improve the following deficits and impairments:  Improper body mechanics,Pain,Decreased mobility,Hypermobility,Increased muscle spasms,Postural dysfunction,Decreased activity tolerance,Decreased endurance,Decreased strength,Impaired perceived functional ability,Difficulty walking,Abnormal gait,Decreased balance  Visit Diagnosis: Pain in right hip  Pain in left hip  Chronic bilateral low back pain, unspecified whether sciatica present  Difficulty in walking, not elsewhere classified  Muscle weakness (generalized)  Other muscle spasm     Problem List Patient Active Problem List   Diagnosis Date Noted  . Trochanteric bursitis of both hips 11/30/2020  . Special screening for malignant neoplasms, colon   . Polyp of cecum   . Pain in both feet 04/12/2020  . Burning sensation of feet 04/12/2020  . Pain of both hip joints 04/12/2020  . Onychomycosis 04/12/2020  . Fibrocystic breast disease (FCBD) 04/12/2020  . Numbness in both legs 09/09/2019  . Spinal stenosis in cervical region 05/09/2019  . Facet arthropathy, cervical 05/09/2019  . Chronic pain 11/27/2018  . Obesity (BMI 30.0-34.9) 11/27/2018  . Sprain and strain of hip and thigh 05/10/2018  . Abnormal MRI, cervical spine 03/28/2018  . Menopause 11/26/2017  . Right upper quadrant  pain 11/26/2017  . Abnormal Pap smear of cervix 07/27/2017  . Vasomotor flushing 07/19/2017  . Depression 07/18/2017  . Nausea  06/25/2017  . Chronic pain of both hips 06/25/2017  . OSA (obstructive sleep apnea) 04/18/2017  . Annual physical exam 04/10/2017  . Fatigue 12/16/2016  . Chronic back pain 05/30/2016  . Abdominal pain 03/12/2016  . Anxiety and depression 12/28/2015  . History of obstructive sleep apnea 12/15/2015  . GERD (gastroesophageal reflux disease) 12/15/2015  . Hyperlipidemia 12/15/2015  . Pain medication agreement signed 03/25/2015  . CAD in native artery 02/05/2015  . Right supraspinatus tenosynovitis 08/17/2014  . Crohn's disease involving terminal ileum ([place]) 03/30/2014  . FH: colon polyps 03/30/2014  . 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 Other bursitis disorders 07/12/2012  . Pain in joint, pelvic region and thigh 07/12/2012  . Crohn's disease ([place]) 01/23/2012    Everlean Alstrom. Graylon Good, PT, DPT 12/15/20, 8:13 PM  [facility] [address], [place], [address] Phone: [phone]   Fax:  [phone]  Name: Carolyn Esparza MRN: [id number] Date of Birth: 02/26/1966

## 2020-12-20 ENCOUNTER — Ambulatory Visit: Payer: Managed Care, Other (non HMO) | Admitting: Physical Therapy

## 2020-12-22 ENCOUNTER — Ambulatory Visit: Payer: Managed Care, Other (non HMO) | Admitting: Physical Therapy

## 2020-12-28 ENCOUNTER — Ambulatory Visit: Payer: Managed Care, Other (non HMO) | Admitting: Physical Therapy

## 2020-12-28 ENCOUNTER — Encounter: Payer: Self-pay | Admitting: Physical Therapy

## 2020-12-28 ENCOUNTER — Other Ambulatory Visit: Payer: Self-pay

## 2020-12-28 DIAGNOSIS — M25551 Pain in right hip: Secondary | ICD-10-CM

## 2020-12-28 DIAGNOSIS — R262 Difficulty in walking, not elsewhere classified: Secondary | ICD-10-CM

## 2020-12-28 DIAGNOSIS — M6281 Muscle weakness (generalized): Secondary | ICD-10-CM

## 2020-12-28 DIAGNOSIS — M545 Low back pain, unspecified: Secondary | ICD-10-CM

## 2020-12-28 DIAGNOSIS — M62838 Other muscle spasm: Secondary | ICD-10-CM

## 2020-12-28 DIAGNOSIS — G8929 Other chronic pain: Secondary | ICD-10-CM

## 2020-12-28 DIAGNOSIS — M25552 Pain in left hip: Secondary | ICD-10-CM

## 2020-12-28 NOTE — Therapy (Signed)
[facility] [address], [place], [address] Phone: [phone]   Fax:  [phone]  Physical Therapy Treatment  Patient Details  Name: Carolyn Esparza MRN: [id number] Date of Birth: [birth year] Referring Provider (PT): [name], [name], MD   Encounter Date: 12/28/2020   PT End of Session - 12/28/20 1939    Visit Number 3    Number of Visits 24    Date for PT Re-Evaluation 03/07/21    Authorization Type [employer] reporting period from 12/13/2020    Authorization Time Period 60 visits Per calendar year. Pre auth IS req after 5th visit  verified by Sherlie Ban [id number]    Authorization - Visit Number 3    Authorization - Number of Visits 60    Progress Note Due on Visit 10    PT Start Time 1120    PT Stop Time 1200    PT Time Calculation (min) 40 min    Activity Tolerance Patient tolerated treatment well;Patient limited by pain    Behavior During Therapy [facility] for tasks assessed/performed           Past Medical History:  Diagnosis Date  . Anxiety   . ASCUS with positive high risk HPV cervical   . CAD (coronary artery disease)   . Chicken pox   . Chronic pain    neck, back, b/l hips   . Complication of anesthesia   . Depression   . GERD (gastroesophageal reflux disease)   . History of Crohn's disease   . Hyperlipidemia   . Left ovarian cyst    s/p removal of 1 ovary ? which one removed per pt   . Libido, decreased   . Skin cancer    reports skin cancer removed from skin in 2016 or 2017  . Thyroid nodule    repeat US resolved and Ct neck 05/2019 normal thyroid     Past Surgical History:  Procedure Laterality Date  . ABDOMINAL HYSTERECTOMY    . APPENDECTOMY  2004  . BLADDER SURGERY    . BREAST BIOPSY     x 2  . COLONOSCOPY    . COLONOSCOPY WITH PROPOFOL N/A 10/25/2020   Procedure: COLONOSCOPY WITH PROPOFOL;  Surgeon: Mauri Pole, MD;  Location: [facility];  Service: Endoscopy;   Laterality: N/A;  . HEMOSTASIS CLIP PLACEMENT  10/25/2020   Procedure: HEMOSTASIS CLIP PLACEMENT;  Surgeon: Mauri Pole, MD;  Location: [facility];  Service: Endoscopy;;  . OOPHORECTOMY Right    Unilateral Right (per MRI 04/17/2018)  . POLYPECTOMY  10/25/2020   Procedure: POLYPECTOMY;  Surgeon: Mauri Pole, MD;  Location: [facility];  Service: Endoscopy;;  . SUBMUCOSAL LIFTING INJECTION  10/25/2020   Procedure: SUBMUCOSAL LIFTING INJECTION;  Surgeon: Mauri Pole, MD;  Location: [facility];  Service: Endoscopy;;  . WISDOM TOOTH EXTRACTION      There were no vitals filed for this visit.   Subjective Assessment - 12/28/20 1126    Subjective Patient reports she felt okay after last treatment session but she had several days while at the beach where her right hip hurt a lot so she was restricted in her movement. States her hip really starts to hurt after 7-8K steps. It took her a lot of steps to get everything ready for her trip. She is tired today. She has mostly stopped walking her dog. States her current pain number is 6/10 in the R hip and 2-3/10 in the left hip. Back  is pretty good today. Back doesn't hurt as much if her hip is feeling worse. If back is worse, usually hip doesn't hurt so much. Only got HEP in 1 time over her trip to the beach but brought her equipment. Would like to do dry needling today.    Pertinent History Patient is a 55 y.o. female who presents to outpatient physical therapy with a referral for medical diagnosis chronic pain of both hips, pain of both hip joints, low back pain, unspecified back pain laterality, unspecified chronicity, unspecified whether sciatica present,  trochanteric bursitis of both hips. This patient's chief complaints consist of chronic bilateral hip pain R > L in setting of chronic low back pain leading to the following functional deficits: difficulty with ADLs, IADLs, activities that require weight bearing, walking, standing,  stairs lifting, shaving legs, sleeping and decreased quality of life.   Relevant past medical history and comorbidities include chronic back pain, chronic neck pain, hypermobile in several joints, GERD, fatigue, depression/anxiety, CAD (smallest artery in heart - no stent recommended - sees cardiologist no restrictions), skin cancer (cleared up a few years ago).  Patient denies hx of stroke, seizures, lung problem, major cardiac events, diabetes, unexplained weight loss, changes in bowel or bladder problems, stumbling or dropping things, spinal surgeries, osteoporosis.    Limitations Standing;Walking;House hold activities   difficulty with ADLs, IADLs, activities that require weight bearing, walking, standing, stairs lifting, shaving legs, sleeping and decreased quality of life.   Diagnostic tests R&L hip MRI report 04/18/2018: "IMPRESSION:  1. On the right side, there is a partially torn distal gluteus  medius tendon with considerable tendinopathy in the gluteus medius  and mild gluteus minimus tendinopathy, as well as adjacent  trochanteric bursitis.  2. There is also a small right anterior superior labral tear which  appears nondisplaced.  3. On the left side there is distal gluteus medius moderate  tendinopathy and also mild proximal hamstring tendinopathy along  with mild trochanteric bursitis.  4. Spondylosis and degenerative disc disease at L5-S1.  5. Multiple chronic small cystic lesions along the vaginal cuff.  6. Sigmoid colon diverticulosis."    Currently in Pain? Yes    Pain Score 6     Pain Onset More than a month ago           OBJECTIVE FOTO = 31 (12/15/2020);    Therapeutic exercise:to centralize symptoms and improve ROM and strength required for successful completion of functional activities. - step up to 8.5 inch step (leading up with R, down with L) 1 rep before and after dry needling to assess response (increased tenderness after).  - hooklying bridge on heels with wide stance,  2x10, 2 second holds.  - hooklying isometric hip adduction against yoga block longways between knees 1x20, 5 second holds - hooklying isometric hip abduction against gait belt, 1x20, 5 second holds  - hooklying glute bridge with B hip abduction against red theraband, 3 second holds. 2x10  Manual therapy: to reduce pain and tissue tension, improve range of motion, neuromodulation, in order to promote improved ability to complete functional activities. SIDELYING (R side up) with pillows between legs - STM to right lateral hip muscles including glute med/min/max and TFL  Modality: (unbilled) Dry needling performed to right hip region to decrease pain and spasms along patient's right hip region with patient in sidelying utilizing (1) dry needle(s) .17mm x 48mm with (3) sticks at glute med/min and TFL . Patient educated about the risks and benefits from therapy  and verbally consents to treatment.  Dry needling performed by Everlean Alstrom. Graylon Good PT, DPT who is certified in this technique.   Pt required multimodal cuing for proper technique and to facilitate improved neuromuscular control, strength, range of motion, and functional ability resulting in improved performance and form.   HOME EXERCISE PROGRAM Access Code: [email or web address] URL: [email or web address] Date: 12/15/2020 Prepared by: Rosita Kea  Exercises Hooklying Isometric Hip Abduction with Belt - 1 x daily - 1 sets - 20 reps - 5 second hold hold Supine Hip Adduction Isometric with Ball - 1 x daily - 1 sets - 20 reps - 5 seconds hold Bridge on Heels - 1 x daily - 2 sets - 10 reps - 5 seconds hold     PT Education - 12/28/20 1156    Education Details intervention/excercise technique/purpose. self-management techniques    Person(s) Educated Patient    Methods Explanation;Demonstration;Tactile cues;Verbal cues    Comprehension Verbalized understanding;Returned demonstration;Verbal cues required;Tactile cues required;Need further  instruction            PT Short Term Goals - 12/13/20 2027      PT SHORT TERM GOAL #1   Title Be independent with initial home exercise program for self-management of symptoms.    Baseline Initial HEP to be provided at visit 2 (12/13/2020);    Time 3    Period Weeks    Status New    Target Date 01/03/21             PT Long Term Goals - 12/13/20 2028      PT LONG TERM GOAL #1   Title Be independent with a long-term home exercise program for self-management of symptoms.    Baseline Initial HEP to be provided at visit 2 (12/13/2020);    Time 12    Period Weeks    Status New   TARGET DATE FOR ALL LONG TERM GOALS: 03/07/2021     PT LONG TERM GOAL #2   Title Demonstrate improved FOTO score by 10 units to demonstrate improvement in overall condition and self-reported functional ability.    Baseline to be completed visit 2 as appropriate (12/13/2020);    Time 12    Period Weeks    Status New      PT LONG TERM GOAL #3   Title Improve B hip strength to 4+/5 without increase in pain for improved ability to allow patient to complete valued functional tasks such as stairs and walking with less difficulty.    Baseline weak and painful - see objective exam (12/13/2020);    Time 12    Period Weeks    Status New      PT LONG TERM GOAL #4   Title Patient will demonstrate single leg stance balance on firm surface with eyes open of equal or greater than 30 seconds each side to demonstrate improved proprioception and balance for functional activities such as walking, stairs, community and household mobility tasks.    Baseline < 5 seconds both sides (12/13/2020);    Time 12    Period Weeks    Status New      PT LONG TERM GOAL #5   Title Complete community, work and/or recreational activities without limitation due to current condition.    Baseline Functional Limitations:difficulty with ADLs, IADLs, activities that require weight bearing, walking, standing, stairs lifting, shaving legs,  sleeping, hiking (12/13/2020);    Time 12    Period Weeks    Status New  Plan - 12/28/20 1945    Clinical Impression Statement Patient tolerated treatment well overall with report of mild increase in soreness in right hip after dry needling consistent with expected outcome. Patient able to progress strengthening exercises slightly. Patient would benefit from continued management of limiting condition by skilled physical therapist to address remaining impairments and functional limitations to work towards stated goals and return to PLOF or maximal functional independence.    Personal Factors and Comorbidities Comorbidity 3+;Time since onset of injury/illness/exacerbation;Past/Current Experience;Fitness    Comorbidities Relevant past medical history and comorbidities include chronic back pain, chronic neck pain, hypermobile in several joints, GERD, fatigue, depression/anxiety, CAD (smallest artery in heart - no stent recommended - sees cardiologist no restrictions), skin cancer (cleared up a few years ago), former smoker.    Examination-Activity Limitations Squat;Stairs;Lift;Locomotion Level;Stand;Caring for Others;Carry;Sleep    Examination-Participation Restrictions Laundry;Cleaning;Shop;Community Activity;Occupation;Interpersonal Relationship    Stability/Clinical Decision Making Evolving/Moderate complexity    Rehab Potential Good    PT Frequency 2x / week    PT Duration 12 weeks    PT Treatment/Interventions ADLs/Self Care Home Management;Aquatic Therapy;Biofeedback;Electrical Stimulation;Cryotherapy;Moist Heat;Gait training;Stair training;Functional mobility training;Therapeutic activities;Therapeutic exercise;Balance training;Neuromuscular re-education;Patient/family education;Manual techniques;Passive range of motion;Taping;Dry needling;Spinal Manipulations;Joint Manipulations;Other (comment);Energy conservation    PT Next Visit Plan updated HEP as appropriate, LE  strengthening as tolerated, dry needling    PT Home Exercise Plan Medbridge Access Code: [email or web address]    Consulted and Agree with Plan of Care Patient           Patient will benefit from skilled therapeutic intervention in order to improve the following deficits and impairments:  Improper body mechanics,Pain,Decreased mobility,Hypermobility,Increased muscle spasms,Postural dysfunction,Decreased activity tolerance,Decreased endurance,Decreased strength,Impaired perceived functional ability,Difficulty walking,Abnormal gait,Decreased balance  Visit Diagnosis: Pain in right hip  Pain in left hip  Chronic bilateral low back pain, unspecified whether sciatica present  Difficulty in walking, not elsewhere classified  Muscle weakness (generalized)  Other muscle spasm     Problem List Patient Active Problem List   Diagnosis Date Noted  . Trochanteric bursitis of both hips 11/30/2020  . Special screening for malignant neoplasms, colon   . Polyp of cecum   . Pain in both feet 04/12/2020  . Burning sensation of feet 04/12/2020  . Pain of both hip joints 04/12/2020  . Onychomycosis 04/12/2020  . Fibrocystic breast disease (FCBD) 04/12/2020  . Numbness in both legs 09/09/2019  . Spinal stenosis in cervical region 05/09/2019  . Facet arthropathy, cervical 05/09/2019  . Chronic pain 11/27/2018  . Obesity (BMI 30.0-34.9) 11/27/2018  . Sprain and strain of hip and thigh 05/10/2018  . Abnormal MRI, cervical spine 03/28/2018  . Menopause 11/26/2017  . Right upper quadrant pain 11/26/2017  . Abnormal Pap smear of cervix 07/27/2017  . Vasomotor flushing 07/19/2017  . Depression 07/18/2017  . Nausea 06/25/2017  . Chronic pain of both hips 06/25/2017  . OSA (obstructive sleep apnea) 04/18/2017  . Annual physical exam 04/10/2017  . Fatigue 12/16/2016  . Chronic back pain 05/30/2016  . Abdominal pain 03/12/2016  . Anxiety and depression 12/28/2015  . History of obstructive sleep apnea  12/15/2015  . GERD (gastroesophageal reflux disease) 12/15/2015  . Hyperlipidemia 12/15/2015  . Pain medication agreement signed 03/25/2015  . CAD in native artery 02/05/2015  . Right supraspinatus tenosynovitis 08/17/2014  . Crohn's disease involving terminal ileum ([place]) 03/30/2014  . FH: colon polyps 03/30/2014  . 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 Other bursitis disorders 07/12/2012  . Pain in joint, pelvic region and  thigh 07/12/2012  . Crohn's disease ([place]) 01/23/2012    Everlean Alstrom. Graylon Good, PT, DPT 12/28/20, 7:46 PM  [facility] [address] [place], [address] Phone: [phone]   Fax:  [phone]  Name: Carolyn Esparza MRN: [id number] Date of Birth: 03/25/1966

## 2020-12-29 ENCOUNTER — Encounter: Payer: Managed Care, Other (non HMO) | Admitting: Physical Therapy

## 2021-01-03 ENCOUNTER — Encounter: Payer: Self-pay | Admitting: Physical Therapy

## 2021-01-03 ENCOUNTER — Ambulatory Visit: Payer: Managed Care, Other (non HMO) | Attending: Internal Medicine | Admitting: Physical Therapy

## 2021-01-03 ENCOUNTER — Other Ambulatory Visit: Payer: Self-pay

## 2021-01-03 DIAGNOSIS — G8929 Other chronic pain: Secondary | ICD-10-CM | POA: Insufficient documentation

## 2021-01-03 DIAGNOSIS — M62838 Other muscle spasm: Secondary | ICD-10-CM | POA: Diagnosis present

## 2021-01-03 DIAGNOSIS — M25552 Pain in left hip: Secondary | ICD-10-CM

## 2021-01-03 DIAGNOSIS — M545 Low back pain, unspecified: Secondary | ICD-10-CM | POA: Diagnosis present

## 2021-01-03 DIAGNOSIS — R262 Difficulty in walking, not elsewhere classified: Secondary | ICD-10-CM

## 2021-01-03 DIAGNOSIS — M25551 Pain in right hip: Secondary | ICD-10-CM | POA: Insufficient documentation

## 2021-01-03 DIAGNOSIS — M6281 Muscle weakness (generalized): Secondary | ICD-10-CM | POA: Diagnosis present

## 2021-01-03 NOTE — Therapy (Signed)
[facility] [address], [place], [address] Phone: [phone]   Fax:  [phone]  Physical Therapy Treatment / Progress Note Dates of reporting: 12/13/2020 - 01/03/2021  Patient Details  Name: Carolyn Esparza MRN: [id number] Date of Birth: [birth year] Referring Provider (PT): [name], [name], MD   Encounter Date: 01/03/2021   PT End of Session - 01/03/21 1410    Visit Number 4    Number of Visits 24    Date for PT Re-Evaluation 03/07/21    Authorization Type [employer] reporting period from 12/13/2020    Authorization Time Period 60 visits Per calendar year. Pre auth IS req after 5th visit  verified by Sherlie Ban [id number]    Authorization - Visit Number 4    Authorization - Number of Visits 60    Progress Note Due on Visit 10    PT Start Time 1350    PT Stop Time 1430    PT Time Calculation (min) 40 min    Activity Tolerance Patient tolerated treatment well;Patient limited by pain    Behavior During Therapy [facility] for tasks assessed/performed           Past Medical History:  Diagnosis Date  . Anxiety   . ASCUS with positive high risk HPV cervical   . CAD (coronary artery disease)   . Chicken pox   . Chronic pain    neck, back, b/l hips   . Complication of anesthesia   . Depression   . GERD (gastroesophageal reflux disease)   . History of Crohn's disease   . Hyperlipidemia   . Left ovarian cyst    s/p removal of 1 ovary ? which one removed per pt   . Libido, decreased   . Skin cancer    reports skin cancer removed from skin in 2016 or 2017  . Thyroid nodule    repeat US resolved and Ct neck 05/2019 normal thyroid     Past Surgical History:  Procedure Laterality Date  . ABDOMINAL HYSTERECTOMY    . APPENDECTOMY  2004  . BLADDER SURGERY    . BREAST BIOPSY     x 2  . COLONOSCOPY    . COLONOSCOPY WITH PROPOFOL N/A 10/25/2020   Procedure: COLONOSCOPY WITH PROPOFOL;  Surgeon: Mauri Pole,  MD;  Location: [facility];  Service: Endoscopy;  Laterality: N/A;  . HEMOSTASIS CLIP PLACEMENT  10/25/2020   Procedure: HEMOSTASIS CLIP PLACEMENT;  Surgeon: Mauri Pole, MD;  Location: [facility];  Service: Endoscopy;;  . OOPHORECTOMY Right    Unilateral Right (per MRI 04/17/2018)  . POLYPECTOMY  10/25/2020   Procedure: POLYPECTOMY;  Surgeon: Mauri Pole, MD;  Location: [facility];  Service: Endoscopy;;  . SUBMUCOSAL LIFTING INJECTION  10/25/2020   Procedure: SUBMUCOSAL LIFTING INJECTION;  Surgeon: Mauri Pole, MD;  Location: [facility];  Service: Endoscopy;;  . WISDOM TOOTH EXTRACTION      There were no vitals filed for this visit.   Subjective Assessment - 01/03/21 1352    Subjective Patient reports she is feeling like she is in a fog after a lot of work. She did her HEP every day except 6/3 and she did 10 reps of each. Would like to discontinue the hip hike exercise due to feeling a "catch" in her right low back afterwards. States she tolerated dry needling without a problem but also did not feel a benefit. Reports current pain 5/10 in the right lateral hip and left  hip has been a bit more angry in the last few days. Reports she has not noticed any functional improvements since starting PT.    Pertinent History Patient is a 55 y.o. female who presents to outpatient physical therapy with a referral for medical diagnosis chronic pain of both hips, pain of both hip joints, low back pain, unspecified back pain laterality, unspecified chronicity, unspecified whether sciatica present,  trochanteric bursitis of both hips. This patient's chief complaints consist of chronic bilateral hip pain R > L in setting of chronic low back pain leading to the following functional deficits: difficulty with ADLs, IADLs, activities that require weight bearing, walking, standing, stairs lifting, shaving legs, sleeping and decreased quality of life.   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  Patient denies hx of stroke, seizures, lung problem, major cardiac events, diabetes, unexplained weight loss, changes in bowel or bladder problems, stumbling or dropping things, spinal surgeries, osteoporosis.    Limitations Standing;Walking;House hold activities   difficulty with ADLs, IADLs, activities that require weight bearing, walking, standing, stairs lifting, shaving legs, sleeping and decreased quality of life.   Diagnostic tests R&L hip MRI report 04/18/2018: "IMPRESSION:  1. On the right side, there is a partially torn distal gluteus  medius tendon with considerable tendinopathy in the gluteus medius  and mild gluteus minimus tendinopathy, as well as adjacent  trochanteric bursitis.  2. There is also a small right anterior superior labral tear which  appears nondisplaced.  3. On the left side there is distal gluteus medius moderate  tendinopathy and also mild proximal hamstring tendinopathy along  with mild trochanteric bursitis.  4. Spondylosis and degenerative disc disease at L5-S1.  5. Multiple chronic small cystic lesions along the vaginal cuff.  6. Sigmoid colon diverticulosis."    Currently in Pain? Yes    Pain Score 5     Pain Onset More than a month ago             OBJECTIVE FOTO = 38 (01/03/2021);  SLS: R 14 seconds, L 21 seconds  Therapeutic exercise:to centralize symptoms and improve ROM and strength required for successful completion of functional activities. - hooklying isometric hip abduction against gait belt, 1x20, 5 second holds. - hooklying isometric hip adduction against yoga block longways between knees 1x20, 5 second holds - hooklying bridge on heels with wide stance, 2x10, 5 second holds.  - isometric wall  sit, 10x5 seconds red theraball on wall - SLS: R 14 seconds, L 21 seconds  Manual therapy: to reduce pain and tissue tension, improve range of motion, neuromodulation, in order to promote improved ability to complete functional activities. SIDELYING (R side up) with pillows between legs and SUPINE - STM to right lateral hip muscles including glute med/min/max and TFL  Modality: (unbilled) Dry needling performed to right hip region to decrease pain and spasms along patient's right hip region with patient in sidelying/supine utilizing (2) dry needle(s) .29mm x 84mm with (3) sticks at glute med/min and TFL . Patient educated about the risks and benefits from therapy and verbally consents to treatment. Dry needling performed by Everlean Alstrom. Graylon Good PT, DPT who is certified in this technique.  Pt required multimodal cuing for proper technique and to facilitate improved neuromuscular control, strength, range of motion, and functional ability  resulting in improved performance and form.  HOME EXERCISE PROGRAM Access Code: [email or web address] URL: [email or web address] Date: 12/15/2020 Prepared by: Rosita Kea  Exercises Hooklying Isometric Hip Abduction with Belt - 1 x daily - 1 sets - 20 reps - 5 second hold hold Supine Hip Adduction Isometric with Ball - 1 x daily - 1 sets - 20 reps - 5 seconds hold Bridge on Heels - 1 x daily - 2 sets - 10 reps - 5 seconds hold   PT Education - 01/03/21 1524    Education provided Yes    Education Details intervention/excercise technique/purpose. self-management techniques    Person(s) Educated Patient    Methods Explanation;Demonstration;Tactile cues;Verbal cues    Comprehension Verbalized understanding;Returned demonstration;Verbal cues required;Tactile cues required;Need further instruction            PT Short Term Goals - 01/03/21 1526      PT SHORT TERM GOAL #1   Title Be independent with initial home exercise program for self-management of  symptoms.    Baseline Initial HEP to be provided at visit 2 (12/13/2020);    Time 3    Period Weeks    Status Achieved    Target Date 01/03/21             PT Long Term Goals - 01/03/21 1526      PT LONG TERM GOAL #1   Title Be independent with a long-term home exercise program for self-management of symptoms.    Baseline Initial HEP to be provided at visit 2 (12/13/2020); currently participating in appropriate HEP (01/03/2021);    Time 12    Period Weeks    Status Partially Met   TARGET DATE FOR ALL LONG TERM GOALS: 03/07/2021     PT LONG TERM GOAL #2   Title Demonstrate improved FOTO score by 10 units to demonstrate improvement in overall condition and self-reported functional ability.    Baseline to be completed visit 2 as appropriate (12/13/2020); 31 (12/15/2020); 38 (01/03/2021);    Time 12    Period Weeks    Status Partially Met      PT LONG TERM GOAL #3   Title Improve B hip strength to 4+/5 without increase in pain for improved ability to allow patient to complete valued functional tasks such as stairs and walking with less difficulty.    Baseline weak and painful - see objective exam (12/13/2020);    Time 12    Period Weeks    Status On-going      PT LONG TERM GOAL #4   Title Patient will demonstrate single leg stance balance on firm surface with eyes open of equal or greater than 30 seconds each side to demonstrate improved proprioception and balance for functional activities such as walking, stairs, community and household mobility tasks.    Baseline < 5 seconds both sides (12/13/2020); R 14 seconds, L 21 seconds (01/03/2021);    Time 12    Period Weeks    Status Partially Met      PT LONG TERM GOAL #5   Title Complete community, work and/or recreational activities without limitation due to current condition.    Baseline Functional Limitations:difficulty with ADLs, IADLs, activities that require weight bearing, walking, standing, stairs lifting, shaving legs, sleeping, hiking  (12/13/2020);    Time 12    Period Weeks    Status On-going                 Plan - 01/03/21 2026    Clinical Impression  Statement Patient has attended 4 physical therapy sessions this episode of care and is demonstrating appropriate progress towards goals at this point. Demonstrates improved SLS from < 5 seconds each side to 14-21 seconds. Improved FOTO from 31 to 38 demonstrating an improvement in self-reported function. Patient has only completed 4 visits and is expected to require up to 20 more visits to properly address and improve chronic condition with significant comorbid conditions affecting ability to effectively improve with physical therapy including chronic back pain, chronic neck pain, hypermobile in several joints, GERD, fatigue, depression/anxiety, CAD, former smoker. Patient tolerated treatment well today. Patient would benefit from continued management of limiting condition by skilled physical therapist to address remaining impairments and functional limitations to work towards stated goals and return to PLOF or maximal functional independence.    Personal Factors and Comorbidities Comorbidity 3+;Time since onset of injury/illness/exacerbation;Past/Current Experience;Fitness    Comorbidities Relevant past medical history and comorbidities include chronic back pain, chronic neck pain, hypermobile in several joints, GERD, fatigue, depression/anxiety, CAD (smallest artery in heart - no stent recommended - sees cardiologist no restrictions), skin cancer (cleared up a few years ago), former smoker.    Examination-Activity Limitations Squat;Stairs;Lift;Locomotion Level;Stand;Caring for Others;Carry;Sleep    Examination-Participation Restrictions Laundry;Cleaning;Shop;Community Activity;Occupation;Interpersonal Relationship    Stability/Clinical Decision Making Evolving/Moderate complexity    Rehab Potential Good    PT Frequency 2x / week    PT Duration 12 weeks    PT  Treatment/Interventions ADLs/Self Care Home Management;Aquatic Therapy;Biofeedback;Electrical Stimulation;Cryotherapy;Moist Heat;Gait training;Stair training;Functional mobility training;Therapeutic activities;Therapeutic exercise;Balance training;Neuromuscular re-education;Patient/family education;Manual techniques;Passive range of motion;Taping;Dry needling;Spinal Manipulations;Joint Manipulations;Other (comment);Energy conservation    PT Next Visit Plan updated HEP as appropriate, LE strengthening as tolerated, dry needling    PT Home Exercise Plan Medbridge Access Code: [email or web address]    Consulted and Agree with Plan of Care Patient           Patient will benefit from skilled therapeutic intervention in order to improve the following deficits and impairments:  Improper body mechanics,Pain,Decreased mobility,Hypermobility,Increased muscle spasms,Postural dysfunction,Decreased activity tolerance,Decreased endurance,Decreased strength,Impaired perceived functional ability,Difficulty walking,Abnormal gait,Decreased balance  Visit Diagnosis: Pain in right hip  Pain in left hip  Chronic bilateral low back pain, unspecified whether sciatica present  Difficulty in walking, not elsewhere classified  Muscle weakness (generalized)  Other muscle spasm     Problem List Patient Active Problem List   Diagnosis Date Noted  . Trochanteric bursitis of both hips 11/30/2020  . Special screening for malignant neoplasms, colon   . Polyp of cecum   . Pain in both feet 04/12/2020  . Burning sensation of feet 04/12/2020  . Pain of both hip joints 04/12/2020  . Onychomycosis 04/12/2020  . Fibrocystic breast disease (FCBD) 04/12/2020  . Numbness in both legs 09/09/2019  . Spinal stenosis in cervical region 05/09/2019  . Facet arthropathy, cervical 05/09/2019  . Chronic pain 11/27/2018  . Obesity (BMI 30.0-34.9) 11/27/2018  . Sprain and strain of hip and thigh 05/10/2018  . Abnormal MRI, cervical spine  03/28/2018  . Menopause 11/26/2017  . Right upper quadrant pain 11/26/2017  . Abnormal Pap smear of cervix 07/27/2017  . Vasomotor flushing 07/19/2017  . Depression 07/18/2017  . Nausea 06/25/2017  . Chronic pain of both hips 06/25/2017  . OSA (obstructive sleep apnea) 04/18/2017  . Annual physical exam 04/10/2017  . Fatigue 12/16/2016  . Chronic back pain 05/30/2016  . Abdominal pain 03/12/2016  . Anxiety and depression 12/28/2015  . History of obstructive sleep apnea 12/15/2015  .  GERD (gastroesophageal reflux disease) 12/15/2015  . Hyperlipidemia 12/15/2015  . Pain medication agreement signed 03/25/2015  . CAD in native artery 02/05/2015  . Right supraspinatus tenosynovitis 08/17/2014  . Crohn's disease involving terminal ileum ([place]) 03/30/2014  . FH: colon polyps 03/30/2014  . 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 Other bursitis disorders 07/12/2012  . Pain in joint, pelvic region and thigh 07/12/2012  . Crohn's disease ([place]) 01/23/2012    Everlean Alstrom. Graylon Good, PT, DPT 01/03/21, 8:32 PM  [facility] [address], [place], [address] Phone: [phone]   Fax:  [phone]  Name: Carolyn Esparza MRN: [id number] Date of Birth: [birth year]

## 2021-01-05 ENCOUNTER — Ambulatory Visit: Payer: Managed Care, Other (non HMO) | Admitting: Physical Therapy

## 2021-01-05 ENCOUNTER — Encounter: Payer: Self-pay | Admitting: Physical Therapy

## 2021-01-05 ENCOUNTER — Other Ambulatory Visit: Payer: Self-pay

## 2021-01-05 DIAGNOSIS — G8929 Other chronic pain: Secondary | ICD-10-CM

## 2021-01-05 DIAGNOSIS — M62838 Other muscle spasm: Secondary | ICD-10-CM

## 2021-01-05 DIAGNOSIS — R262 Difficulty in walking, not elsewhere classified: Secondary | ICD-10-CM

## 2021-01-05 DIAGNOSIS — M25551 Pain in right hip: Secondary | ICD-10-CM | POA: Diagnosis not present

## 2021-01-05 DIAGNOSIS — M6281 Muscle weakness (generalized): Secondary | ICD-10-CM

## 2021-01-05 DIAGNOSIS — M545 Low back pain, unspecified: Secondary | ICD-10-CM

## 2021-01-05 DIAGNOSIS — M25552 Pain in left hip: Secondary | ICD-10-CM

## 2021-01-05 NOTE — Therapy (Signed)
[facility] [address], [place], [address] Phone: [phone]   Fax:  [phone]  Physical Therapy Treatment  Patient Details  Name: Carolyn Esparza MRN: [id number] Date of Birth: [birth year] Referring Provider (PT): [name], [name], MD   Encounter Date: 01/05/2021   PT End of Session - 01/05/21 1352    Visit Number 5    Number of Visits 24    Date for PT Re-Evaluation 03/07/21    Authorization Type [employer] reporting period from 12/13/2020    Authorization Time Period 60 visits Per calendar year. Pre auth IS req after 5th visit  verified by Sherlie Ban [id number]    Authorization - Visit Number 5    Authorization - Number of Visits 60    Progress Note Due on Visit 10    PT Start Time 8875    PT Stop Time 1427    PT Time Calculation (min) 40 min    Activity Tolerance Patient tolerated treatment well;Patient limited by pain    Behavior During Therapy [facility] for tasks assessed/performed           Past Medical History:  Diagnosis Date  . Anxiety   . ASCUS with positive high risk HPV cervical   . CAD (coronary artery disease)   . Chicken pox   . Chronic pain    neck, back, b/l hips   . Complication of anesthesia   . Depression   . GERD (gastroesophageal reflux disease)   . History of Crohn's disease   . Hyperlipidemia   . Left ovarian cyst    s/p removal of 1 ovary ? which one removed per pt   . Libido, decreased   . Skin cancer    reports skin cancer removed from skin in 2016 or 2017  . Thyroid nodule    repeat US resolved and Ct neck 05/2019 normal thyroid     Past Surgical History:  Procedure Laterality Date  . ABDOMINAL HYSTERECTOMY    . APPENDECTOMY  2004  . BLADDER SURGERY    . BREAST BIOPSY     x 2  . COLONOSCOPY    . COLONOSCOPY WITH PROPOFOL N/A 10/25/2020   Procedure: COLONOSCOPY WITH PROPOFOL;  Surgeon: Mauri Pole, MD;  Location: [facility];  Service: Endoscopy;   Laterality: N/A;  . HEMOSTASIS CLIP PLACEMENT  10/25/2020   Procedure: HEMOSTASIS CLIP PLACEMENT;  Surgeon: Mauri Pole, MD;  Location: [facility];  Service: Endoscopy;;  . OOPHORECTOMY Right    Unilateral Right (per MRI 04/17/2018)  . POLYPECTOMY  10/25/2020   Procedure: POLYPECTOMY;  Surgeon: Mauri Pole, MD;  Location: [facility];  Service: Endoscopy;;  . SUBMUCOSAL LIFTING INJECTION  10/25/2020   Procedure: SUBMUCOSAL LIFTING INJECTION;  Surgeon: Mauri Pole, MD;  Location: [facility];  Service: Endoscopy;;  . WISDOM TOOTH EXTRACTION      There were no vitals filed for this visit.   Subjective Assessment - 01/05/21 1347    Subjective Patient reports she is feeling achy today in her right hip. Reports R hip pain 7/10 and feels that walking the dogs today irritated her pain some. Felt sore after last session and did not do her HEP yesterday due to life and feeling discomfort. Requestes not to do dry needling today. Cannot tell that dry needling has helped anything so far.    Pertinent History Patient is a 55 y.o. female who presents to outpatient physical therapy with a referral for medical diagnosis  chronic pain of both hips, pain of both hip joints, low back pain, unspecified back pain laterality, unspecified chronicity, unspecified whether sciatica present,  trochanteric bursitis of both hips. This patient's chief complaints consist of chronic bilateral hip pain R > L in setting of chronic low back pain leading to the following functional deficits: difficulty with ADLs, IADLs, activities that require weight bearing, walking, standing, stairs lifting, shaving legs, sleeping and decreased quality of life.   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  Patient denies hx of stroke, seizures, lung problem, major cardiac events, diabetes, unexplained weight loss, changes in bowel or bladder problems, stumbling or dropping things, spinal surgeries, osteoporosis.    Limitations Standing;Walking;House hold activities   difficulty with ADLs, IADLs, activities that require weight bearing, walking, standing, stairs lifting, shaving legs, sleeping and decreased quality of life.   Diagnostic tests R&L hip MRI report 04/18/2018: "IMPRESSION:  1. On the right side, there is a partially torn distal gluteus  medius tendon with considerable tendinopathy in the gluteus medius  and mild gluteus minimus tendinopathy, as well as adjacent  trochanteric bursitis.  2. There is also a small right anterior superior labral tear which  appears nondisplaced.  3. On the left side there is distal gluteus medius moderate  tendinopathy and also mild proximal hamstring tendinopathy along  with mild trochanteric bursitis.  4. Spondylosis and degenerative disc disease at L5-S1.  5. Multiple chronic small cystic lesions along the vaginal cuff.  6. Sigmoid colon diverticulosis."    Currently in Pain? Yes    Pain Score 7     Pain Onset More than a month ago              OBJECTIVE FOTO = 38 (01/03/2021);   Therapeutic exercise:to centralize symptoms and improve ROM and strength required for successful completion of functional activities. - hooklying bridge on heels with wide stance, isometric hip abduction against gait belt, 2x10, 5 second holds.  - hooklying bridge on heels with wide stance, isometric hip abduction against yoga block (longways), 2x10, 5 second holds.  - Hooklying dead bug (supine, knees, and shoulders flexed to 90 starting position then alternating hip/knee extension and contralateral shoulder flexion with abdominal activation).  2x 10 each side. -Quadruped bird dog (alternating shoulder flexion/contralateral hip extension with core  muscles braced) ,Cuing for abdominal brace.1 X 10 each (difficult) - isometric wall sit, 10x5 seconds red theraball on wall - standing ball toss 1x10 with self-selected stance with 2kg ball - SLS ball toss 1x10 each side with 2kg theraball.  - side stepping with red/yellow theraband around ankles, 1x10 feet each direction (fatiguing).  - Education on HEP including handout   Pt required multimodal cuing for proper technique and to facilitate improved neuromuscular control, strength, range of motion, and functional ability resulting in improved performance and form.  HOME EXERCISE PROGRAM Access Code: [email or web address] URL: [email or web address] Date: 12/15/2020 Prepared by: Rosita Kea  Exercises Hooklying Isometric Hip Abduction with Belt - 1 x daily - 1 sets - 20 reps - 5 second hold hold Supine Hip Adduction Isometric with Ball - 1 x daily - 1 sets - 20 reps - 5 seconds hold Bridge on Heels - 1 x daily - 2 sets - 10 reps - 5 seconds hold  PT Education - 01/05/21 1352    Education Details intervention/excercise technique/purpose. self-management techniques    Person(s) Educated Patient    Methods Explanation;Demonstration;Tactile cues;Verbal cues    Comprehension Verbalized understanding;Returned demonstration;Verbal cues required;Tactile cues required;Need further instruction            PT Short Term Goals - 01/03/21 1526      PT SHORT TERM GOAL #1   Title Be independent with initial home exercise program for self-management of symptoms.    Baseline Initial HEP to be provided at visit 2 (12/13/2020);    Time 3    Period Weeks    Status Achieved    Target Date 01/03/21             PT Long Term Goals - 01/03/21 1526      PT LONG TERM GOAL #1   Title Be independent with a long-term home exercise program for self-management of symptoms.    Baseline Initial HEP to be provided at visit 2 (12/13/2020); currently participating in appropriate HEP (01/03/2021);     Time 12    Period Weeks    Status Partially Met   TARGET DATE FOR ALL LONG TERM GOALS: 03/07/2021     PT LONG TERM GOAL #2   Title Demonstrate improved FOTO score by 10 units to demonstrate improvement in overall condition and self-reported functional ability.    Baseline to be completed visit 2 as appropriate (12/13/2020); 31 (12/15/2020); 38 (01/03/2021);    Time 12    Period Weeks    Status Partially Met      PT LONG TERM GOAL #3   Title Improve B hip strength to 4+/5 without increase in pain for improved ability to allow patient to complete valued functional tasks such as stairs and walking with less difficulty.    Baseline weak and painful - see objective exam (12/13/2020);    Time 12    Period Weeks    Status On-going      PT LONG TERM GOAL #4   Title Patient will demonstrate single leg stance balance on firm surface with eyes open of equal or greater than 30 seconds each side to demonstrate improved proprioception and balance for functional activities such as walking, stairs, community and household mobility tasks.    Baseline < 5 seconds both sides (12/13/2020); R 14 seconds, L 21 seconds (01/03/2021);    Time 12    Period Weeks    Status Partially Met      PT LONG TERM GOAL #5   Title Complete community, work and/or recreational activities without limitation due to current condition.    Baseline Functional Limitations:difficulty with ADLs, IADLs, activities that require weight bearing, walking, standing, stairs lifting, shaving legs, sleeping, hiking (12/13/2020);    Time 12    Period Weeks    Status On-going                 Plan - 01/05/21 1420    Clinical Impression Statement Patient tolerated treatment well overall and was able to progress exercises slightly and advance HEP. Fatigued easily and found exercises challenging due to weakness and low activity tolerance. Continued to focus on hip and lower quarter strengthening.  Patient would benefit from continued management  of limiting condition by skilled physical therapist to address remaining impairments and functional limitations to work towards stated goals and return to PLOF or maximal functional independence.    Personal Factors and Comorbidities Comorbidity 3+;Time since onset of injury/illness/exacerbation;Past/Current Experience;Fitness  Comorbidities Relevant past medical history and comorbidities include chronic back pain, chronic neck pain, hypermobile in several joints, GERD, fatigue, depression/anxiety, CAD (smallest artery in heart - no stent recommended - sees cardiologist no restrictions), skin cancer (cleared up a few years ago), former smoker.    Examination-Activity Limitations Squat;Stairs;Lift;Locomotion Level;Stand;Caring for Others;Carry;Sleep    Examination-Participation Restrictions Laundry;Cleaning;Shop;Community Activity;Occupation;Interpersonal Relationship    Stability/Clinical Decision Making Evolving/Moderate complexity    Rehab Potential Good    PT Frequency 2x / week    PT Duration 12 weeks    PT Treatment/Interventions ADLs/Self Care Home Management;Aquatic Therapy;Biofeedback;Electrical Stimulation;Cryotherapy;Moist Heat;Gait training;Stair training;Functional mobility training;Therapeutic activities;Therapeutic exercise;Balance training;Neuromuscular re-education;Patient/family education;Manual techniques;Passive range of motion;Taping;Dry needling;Spinal Manipulations;Joint Manipulations;Other (comment);Energy conservation    PT Next Visit Plan updated HEP as appropriate, LE strengthening as tolerated, dry needling    PT Home Exercise Plan Medbridge Access Code: [email or web address]    Consulted and Agree with Plan of Care Patient           Patient will benefit from skilled therapeutic intervention in order to improve the following deficits and impairments:  Improper body mechanics,Pain,Decreased mobility,Hypermobility,Increased muscle spasms,Postural dysfunction,Decreased activity  tolerance,Decreased endurance,Decreased strength,Impaired perceived functional ability,Difficulty walking,Abnormal gait,Decreased balance  Visit Diagnosis: Pain in right hip  Pain in left hip  Chronic bilateral low back pain, unspecified whether sciatica present  Difficulty in walking, not elsewhere classified  Muscle weakness (generalized)  Other muscle spasm     Problem List Patient Active Problem List   Diagnosis Date Noted  . Trochanteric bursitis of both hips 11/30/2020  . Special screening for malignant neoplasms, colon   . Polyp of cecum   . Pain in both feet 04/12/2020  . Burning sensation of feet 04/12/2020  . Pain of both hip joints 04/12/2020  . Onychomycosis 04/12/2020  . Fibrocystic breast disease (FCBD) 04/12/2020  . Numbness in both legs 09/09/2019  . Spinal stenosis in cervical region 05/09/2019  . Facet arthropathy, cervical 05/09/2019  . Chronic pain 11/27/2018  . Obesity (BMI 30.0-34.9) 11/27/2018  . Sprain and strain of hip and thigh 05/10/2018  . Abnormal MRI, cervical spine 03/28/2018  . Menopause 11/26/2017  . Right upper quadrant pain 11/26/2017  . Abnormal Pap smear of cervix 07/27/2017  . Vasomotor flushing 07/19/2017  . Depression 07/18/2017  . Nausea 06/25/2017  . Chronic pain of both hips 06/25/2017  . OSA (obstructive sleep apnea) 04/18/2017  . Annual physical exam 04/10/2017  . Fatigue 12/16/2016  . Chronic back pain 05/30/2016  . Abdominal pain 03/12/2016  . Anxiety and depression 12/28/2015  . History of obstructive sleep apnea 12/15/2015  . GERD (gastroesophageal reflux disease) 12/15/2015  . Hyperlipidemia 12/15/2015  . Pain medication agreement signed 03/25/2015  . CAD in native artery 02/05/2015  . Right supraspinatus tenosynovitis 08/17/2014  . Crohn's disease involving terminal ileum ([place]) 03/30/2014  . FH: colon polyps 03/30/2014  . 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 Other bursitis disorders 07/12/2012  . Pain in  joint, pelvic region and thigh 07/12/2012  . Crohn's disease ([place]) 01/23/2012    Everlean Alstrom. Graylon Good, PT, DPT 01/05/21, 2:27 PM  [facility] [address], [place], [address] Phone: [phone]   Fax:  [phone]  Name: Carolyn Esparza MRN: [id number] Date of Birth: [birth year]

## 2021-01-06 ENCOUNTER — Ambulatory Visit: Payer: Managed Care, Other (non HMO) | Attending: Anesthesiology | Admitting: Anesthesiology

## 2021-01-06 ENCOUNTER — Encounter: Payer: Self-pay | Admitting: Anesthesiology

## 2021-01-06 VITALS — BP 129/96 | HR 83 | Temp 97.4°F | Wt 143.0 lb

## 2021-01-06 DIAGNOSIS — M47816 Spondylosis without myelopathy or radiculopathy, lumbar region: Secondary | ICD-10-CM

## 2021-01-06 DIAGNOSIS — M545 Low back pain, unspecified: Secondary | ICD-10-CM

## 2021-01-06 DIAGNOSIS — M51369 Other intervertebral disc degeneration, lumbar region without mention of lumbar back pain or lower extremity pain: Secondary | ICD-10-CM

## 2021-01-06 DIAGNOSIS — M5136 Other intervertebral disc degeneration, lumbar region: Secondary | ICD-10-CM | POA: Insufficient documentation

## 2021-01-06 DIAGNOSIS — F1190 Opioid use, unspecified, uncomplicated: Secondary | ICD-10-CM

## 2021-01-06 DIAGNOSIS — G8929 Other chronic pain: Secondary | ICD-10-CM

## 2021-01-06 DIAGNOSIS — G894 Chronic pain syndrome: Secondary | ICD-10-CM

## 2021-01-06 HISTORY — DX: Spondylosis without myelopathy or radiculopathy, lumbar region: M47.816

## 2021-01-06 HISTORY — DX: Other intervertebral disc degeneration, lumbar region without mention of lumbar back pain or lower extremity pain: M51.369

## 2021-01-06 HISTORY — DX: Other intervertebral disc degeneration, lumbar region: M51.36

## 2021-01-06 NOTE — Progress Notes (Signed)
Safety precautions to be maintained throughout the outpatient stay will include: orient to surroundings, keep bed in low position, maintain call bell within reach at all times, provide assistance with transfer out of bed and ambulation.  

## 2021-01-06 NOTE — Progress Notes (Signed)
Subjective:  Patient ID: Carolyn Esparza, female    DOB: Mar 21, 1966  Age: 55 y.o. MRN: [id number]  CC: No chief complaint on file.     PROCEDURE: None  HPI Carolyn Esparza presents for a new patient evaluation.  She is a pleasant 55 year old white female with a longstanding history of low back pain and some intermittent neck pain.  She presents today via referral from the [facility] pain management system.  She sees Dr. Jeneen Rinks there and has been a patient there for many years.  She reports that she has chronic low back pain described as a aching burning deep and disabling pain with radiation into the bilateral hips and buttocks and sometimes goes down the upper lateral leg.  Rarely does she ever get sciatica-like symptoms she reports.  The pain has been generally stable but has a maximum pain score of an 8 and best to 3 and Reitnauer 7.  It does not appear to be influenced by time of day.  The pain is aggravated by bending climbing rotational movement prolonged standing or stooping and twisting.  Walking seems to aggravate the pain as well.  Alleviating factors include cold and heat application lying down medication management for which she has been on for a prolonged period of time and radiofrequency nerve blocks.  She reports that the radiofrequency blocks give her anywhere from 70 to 80% relief on average lasting 6 months or more.  In the past she has had previous MRI and CT scans for evaluation in addition to nerve blocks and x-rays.  She is had previous neurologic neurosurgical and orthopedic evaluations in addition to psychiatric evaluation through pain management.  The pain is described as depressing and affects her concentration.  She denies problems with bowel or bladder function or recent change in strength or lower extremity function.  The neck pain has been stable and is noncontributory today.  This is been present for the low back over 20 years.  History Carolyn Esparza has a past medical history of Anxiety,  ASCUS with positive high risk HPV cervical, CAD (coronary artery disease), Chicken pox, Chronic pain, Complication of anesthesia, DDD (degenerative disc disease), lumbar (01/06/2021), Depression, Facet arthritis of lumbar region (01/06/2021), GERD (gastroesophageal reflux disease), History of Crohn's disease, Hyperlipidemia, Left ovarian cyst, Libido, decreased, Skin cancer, and Thyroid nodule.   She has a past surgical history that includes Appendectomy (2004); Bladder surgery; Abdominal hysterectomy; Wisdom tooth extraction; Oophorectomy (Right); Colonoscopy; Breast biopsy; Colonoscopy with propofol (N/A, 10/25/2020); polypectomy (10/25/2020); Hemostasis clip placement (10/25/2020); and Submucosal lifting injection (10/25/2020).   Her family history includes Alcohol abuse in her mother; Cancer in her paternal aunt; Crohn's disease in her sister; Depression in her sister; Diabetes in her mother; Heart disease in her mother; Hyperlipidemia in her mother; Hypertension in her mother; Intellectual disability in her niece; Leukemia in her paternal grandmother; Osteoporosis in her sister.She reports that she quit smoking about 20 years ago. Her smoking use included cigarettes. She has never used smokeless tobacco. She reports that she does not drink alcohol and does not use drugs.  No valid procedures specified.  No results found for: TOXASSSELUR  Outpatient Medications Prior to Visit  Medication Sig Dispense Refill   atorvastatin (LIPITOR) 10 MG tablet TAKE 1 TABLET(10 MG) BY MOUTH DAILY AT NIGHT 90 tablet 1   buPROPion (WELLBUTRIN XL) 300 MG 24 hr tablet TAKE 1 TABLET(300 MG) BY MOUTH DAILY 90 tablet 3   celecoxib (CELEBREX) 200 MG capsule Take 1 capsule (200 mg total)  by mouth daily. (Patient taking differently: Take 200 mg by mouth every other day.) 90 capsule 3   estradiol (CLIMARA - DOSED IN MG/24 HR) 0.025 mg/24hr patch APPLY 1 PATCH EXTERNALLY TO THE SKIN ONCE A WEEK (Patient taking differently: Place  0.025 mg onto the skin once a week. APPLY 1 PATCH EXTERNALLY TO THE SKIN ONCE A WEEK) 12 patch 3   fentaNYL (DURAGESIC) 12 MCG/HR Place 1 patch onto the skin every 3 (three) days.     HYDROcodone-acetaminophen (NORCO) 10-325 MG tablet Take 1 tablet by mouth daily as needed for severe pain.     lidocaine (LIDODERM) 5 % Place 2 patches onto the skin daily.      Medium Chain Triglycerides (MCT OIL PO) Take by mouth.     Multiple Vitamin (MULTI-VITAMINS) TABS Take 1 tablet by mouth daily.      Omega-3 Fatty Acids (FISH OIL PO) Take 1 capsule by mouth daily.      omeprazole (PRILOSEC) 20 MG capsule Take 1 capsule (20 mg total) by mouth daily. In am before food 30 minutes 90 capsule 3   promethazine (PHENERGAN) 25 MG tablet Take 1 tablet (25 mg total) by mouth 2 (two) times daily as needed for nausea or vomiting. 30 tablet 2   promethazine (PHENERGAN) 25 MG tablet Take 1 tablet (25 mg total) by mouth daily as needed for nausea or vomiting. 30 tablet 5   tiZANidine (ZANAFLEX) 4 MG capsule Take 1 capsule (4 mg total) by mouth at bedtime as needed for muscle spasms. 30 capsule 5   valACYclovir (VALTREX) 1000 MG tablet 2 pills day 1 then bid X 3-7 days prn 60 tablet 11   TRINTELLIX 5 MG TABS tablet Take 5 mg by mouth 2 (two) times a week. (Patient not taking: Reported on 12/13/2020)     No facility-administered medications prior to visit.   Lab Results  Component Value Date   WBC 7.7 04/16/2020   HGB 14.1 04/16/2020   HCT 42.6 04/16/2020   PLT 284 04/16/2020   GLUCOSE 84 08/27/2020   CHOL 163 12/06/2020   TRIG 111 12/06/2020   HDL 55 12/06/2020   LDLCALC 88 12/06/2020   ALT 17 08/27/2020   AST 26 08/27/2020   NA 142 08/27/2020   K 4.0 08/27/2020   CL 100 08/27/2020   CREATININE 1.01 (H) 08/27/2020   BUN 12 08/27/2020   CO2 26 08/27/2020   TSH 4.100 04/16/2020   INR 1.1 05/19/2014     --------------------------------------------------------------------------------------------------------------------- MM 3D SCREEN BREAST BILATERAL  Result Date: 11/25/2020 CLINICAL DATA:  Screening. EXAM: DIGITAL SCREENING BILATERAL MAMMOGRAM WITH TOMOSYNTHESIS AND CAD TECHNIQUE: Bilateral screening digital craniocaudal and mediolateral oblique mammograms were obtained. Bilateral screening digital breast tomosynthesis was performed. The images were evaluated with computer-aided detection. COMPARISON:  Previous exam(s). ACR Breast Density Category b: There are scattered areas of fibroglandular density. FINDINGS: There are no findings suspicious for malignancy. The images were evaluated with computer-aided detection. IMPRESSION: No mammographic evidence of malignancy. A result letter of this screening mammogram will be mailed directly to the patient. RECOMMENDATION: Screening mammogram in one year. (Code:SM-B-01Y) BI-RADS CATEGORY  1: Negative. Electronically Signed   By: Valentino Saxon MD   On: 11/25/2020 13:59       ---------------------------------------------------------------------------------------------------------------------- Past Medical History:  Diagnosis Date   Anxiety    ASCUS with positive high risk HPV cervical    CAD (coronary artery disease)    Chicken pox    Chronic pain    neck,  back, b/l hips    Complication of anesthesia    DDD (degenerative disc disease), lumbar 01/06/2021   Depression    Facet arthritis of lumbar region 01/06/2021   GERD (gastroesophageal reflux disease)    History of Crohn's disease    Hyperlipidemia    Left ovarian cyst    s/p removal of 1 ovary ? which one removed per pt    Libido, decreased    Skin cancer    reports skin cancer removed from skin in 2016 or 2017   Thyroid nodule    repeat US resolved and Ct neck 05/2019 normal thyroid     Past Surgical History:  Procedure Laterality Date   ABDOMINAL HYSTERECTOMY     APPENDECTOMY   2004   BLADDER SURGERY     BREAST BIOPSY     x 2   COLONOSCOPY     COLONOSCOPY WITH PROPOFOL N/A 10/25/2020   Procedure: COLONOSCOPY WITH PROPOFOL;  Surgeon: Mauri Pole, MD;  Location: [facility];  Service: Endoscopy;  Laterality: N/A;   HEMOSTASIS CLIP PLACEMENT  10/25/2020   Procedure: HEMOSTASIS CLIP PLACEMENT;  Surgeon: Mauri Pole, MD;  Location: [facility];  Service: Endoscopy;;   OOPHORECTOMY Right    Unilateral Right (per MRI 04/17/2018)   POLYPECTOMY  10/25/2020   Procedure: POLYPECTOMY;  Surgeon: Mauri Pole, MD;  Location: [facility];  Service: Endoscopy;;   SUBMUCOSAL LIFTING INJECTION  10/25/2020   Procedure: SUBMUCOSAL LIFTING INJECTION;  Surgeon: Mauri Pole, MD;  Location: [facility];  Service: Endoscopy;;   WISDOM TOOTH EXTRACTION      Family History  Problem Relation Age of Onset   Alcohol abuse Mother    Hyperlipidemia Mother    Heart disease Mother    Hypertension Mother    Diabetes Mother    Crohn's disease Sister        1/2 sister   Depression Sister    Osteoporosis Sister    Leukemia Paternal Grandmother    Cancer Paternal Aunt        blood, type unknown   Intellectual disability Niece    Breast cancer Neg Hx     Social History   Tobacco Use   Smoking status: Former    Pack years: 0.00    Types: Cigarettes    Quit date: 08/29/2000    Years since quitting: 20.3   Smokeless tobacco: Never  Substance Use Topics   Alcohol use: No    Alcohol/week: 0.0 standard drinks    Comment: social     ---------------------------------------------------------------------------------------------------------------------  Scheduled Meds: Continuous Infusions: PRN Meds:.   BP (!) 129/96   Pulse 83   Temp (!) 97.4 F (36.3 C)   Wt 143 lb (64.9 kg)   SpO2 98%   BMI 25.33 kg/m    BP Readings from Last 3 Encounters:  01/06/21 (!) 129/96  11/30/20 104/72  10/25/20 138/73     Wt Readings from Last 3 Encounters:   01/06/21 143 lb (64.9 kg)  11/30/20 148 lb 12.8 oz (67.5 kg)  10/25/20 149 lb (67.6 kg)     ----------------------------------------------------------------------------------------------------------------------  ROS Review of Systems Cardiac: No palpitations no angina Pulmonary: No shortness of breath or dyspnea or wheezing GI: No constipation diarrhea or nausea GU: No dysuria or burning Constitutional: No fevers chills  Objective:  BP (!) 129/96   Pulse 83   Temp (!) 97.4 F (36.3 C)   Wt 143 lb (64.9 kg)   SpO2 98%   BMI 25.33 kg/m  Physical Exam Patient is alert oriented cooperative compliant Pupils are equally round reactive light extraocular muscles intact Heart is regular rate and rhythm without murmur or rub Pulmonary: Breath sounds are equal bilaterally with no wheezes or rales Musculoskeletal with the patient standing she has some mild paraspinous muscle tenderness but no overt trigger points in the lumbar region.  She has some pain with extension at the low back and right lateral rotation reproducing her primary pain complaint and less so aggravated with left lateral rotation.  With the patient in the supine position she has a negative straight leg raise bilaterally.  Her muscle tone and bulk is good.  Sensation is intact throughout the lower extremities and muscle tone is 5/5 both proximal and distal.     Assessment & Plan:   Diagnoses and all orders for this visit:  Chronic pain syndrome  Chronic bilateral low back pain without sciatica  DDD (degenerative disc disease), lumbar  Facet arthritis of lumbar region  Chronic, continuous use of opioids  Spondylosis of lumbar region without myelopathy or radiculopathy     ----------------------------------------------------------------------------------------------------------------------  Problem List Items Addressed This Visit       Unprioritized   Chronic back pain   Chronic pain - Primary    DDD (degenerative disc disease), lumbar   Facet arthritis of lumbar region   Other Visit Diagnoses     Chronic, continuous use of opioids       Spondylosis of lumbar region without myelopathy or radiculopathy           ----------------------------------------------------------------------------------------------------------------------  1. Chronic pain syndrome I have reviewed the [facility] practitioner database information and is appropriate.  We will start her on Duragesic 12 mic patches as she has been previously.  We will continue with the short acting opioids for breakthrough pain and she is averaging approximately 1/day.  This will be initiated in August as she has refills presently through the [facility] system until then.  2. Chronic bilateral low back pain without sciatica Continue with core stretching strengthening exercises as reviewed today  3. DDD (degenerative disc disease), lumbar As above  4. Facet arthritis of lumbar region We will defer on any injections at this point and pending her pain cycle would proceed with the radiofrequency ablation unilaterally as needed.  These been very effective for her and given her generally 70 to 90% relief lasting 6 to 9 months at a time.  She averages this once per year.  We have talked about options including a dorsal column stimulator for consideration but she refuses further consideration at this time which I think is reasonable.  5. Chronic, continuous use of opioids As above and she has reviewed the narcotic contract and is aware of her narcotic based policies here in the clinic.  6. Spondylosis of lumbar region without myelopathy or radiculopathy As above    ----------------------------------------------------------------------------------------------------------------------  I am having Carolyn Esparza maintain her Multi-Vitamins, lidocaine, Omega-3 Fatty Acids (FISH OIL PO), promethazine, fentaNYL, Trintellix, celecoxib,  estradiol, HYDROcodone-acetaminophen, buPROPion, atorvastatin, Medium Chain Triglycerides (MCT OIL PO), promethazine, valACYclovir, omeprazole, and tiZANidine.   No orders of the defined types were placed in this encounter.      Follow-up: Return in about 2 months (around 03/08/2021) for med refill, evaluation.    Molli Barrows, MD 2:03 PM  The [facility] practitioner database for opioid medications on this patient has been reviewed by me and my staff   Greater than 50% of the total encounter time was spent in counseling  and / or coordination of care.     This dictation was performed utilizing Systems analyst.  Please excuse any unintentional or mistaken typographical errors as a result.

## 2021-01-10 ENCOUNTER — Ambulatory Visit: Payer: Managed Care, Other (non HMO) | Admitting: Physical Therapy

## 2021-01-10 ENCOUNTER — Encounter: Payer: Self-pay | Admitting: Physical Therapy

## 2021-01-10 DIAGNOSIS — M62838 Other muscle spasm: Secondary | ICD-10-CM

## 2021-01-10 DIAGNOSIS — M25551 Pain in right hip: Secondary | ICD-10-CM | POA: Diagnosis not present

## 2021-01-10 DIAGNOSIS — G8929 Other chronic pain: Secondary | ICD-10-CM

## 2021-01-10 DIAGNOSIS — M6281 Muscle weakness (generalized): Secondary | ICD-10-CM

## 2021-01-10 DIAGNOSIS — R262 Difficulty in walking, not elsewhere classified: Secondary | ICD-10-CM

## 2021-01-10 DIAGNOSIS — M545 Low back pain, unspecified: Secondary | ICD-10-CM

## 2021-01-10 DIAGNOSIS — M25552 Pain in left hip: Secondary | ICD-10-CM

## 2021-01-10 NOTE — Therapy (Signed)
[facility] [address], [place], [address] Phone: [phone]   Fax:  [phone]  Physical Therapy Treatment  Patient Details  Name: Carolyn Esparza MRN: [id number] Date of Birth: April 01, 1966 Referring Provider (PT): [name], [name], MD   Encounter Date: 01/10/2021   PT End of Session - 01/10/21 1357     Visit Number 6    Number of Visits 24    Date for PT Re-Evaluation 03/07/21    Authorization Type [employer] reporting period from 12/13/2020    Authorization Time Period 60 visits Per calendar year. Auth is not requried/ref number [id number]    Authorization - Visit Number 6    Authorization - Number of Visits 60    Progress Note Due on Visit 10    PT Start Time 7544    PT Stop Time 1427    PT Time Calculation (min) 40 min    Activity Tolerance Patient tolerated treatment well;Patient limited by pain    Behavior During Therapy WFL for tasks assessed/performed             Past Medical History:  Diagnosis Date   Anxiety    ASCUS with positive high risk HPV cervical    CAD (coronary artery disease)    Chicken pox    Chronic pain    neck, back, b/l hips    Complication of anesthesia    DDD (degenerative disc disease), lumbar 01/06/2021   Depression    Facet arthritis of lumbar region 01/06/2021   GERD (gastroesophageal reflux disease)    History of Crohn's disease    Hyperlipidemia    Left ovarian cyst    s/p removal of 1 ovary ? which one removed per pt    Libido, decreased    Skin cancer    reports skin cancer removed from skin in 2016 or 2017   Thyroid nodule    repeat US resolved and Ct neck 05/2019 normal thyroid     Past Surgical History:  Procedure Laterality Date   ABDOMINAL HYSTERECTOMY     APPENDECTOMY  2004   BLADDER SURGERY     BREAST BIOPSY     x 2   COLONOSCOPY     COLONOSCOPY WITH PROPOFOL N/A 10/25/2020   Procedure: COLONOSCOPY WITH PROPOFOL;  Surgeon: Mauri Pole,  MD;  Location: [facility];  Service: Endoscopy;  Laterality: N/A;   HEMOSTASIS CLIP PLACEMENT  10/25/2020   Procedure: HEMOSTASIS CLIP PLACEMENT;  Surgeon: Mauri Pole, MD;  Location: [facility];  Service: Endoscopy;;   OOPHORECTOMY Right    Unilateral Right (per MRI 04/17/2018)   POLYPECTOMY  10/25/2020   Procedure: POLYPECTOMY;  Surgeon: Mauri Pole, MD;  Location: [facility];  Service: Endoscopy;;   SUBMUCOSAL LIFTING INJECTION  10/25/2020   Procedure: SUBMUCOSAL LIFTING INJECTION;  Surgeon: Mauri Pole, MD;  Location: [facility];  Service: Endoscopy;;   WISDOM TOOTH EXTRACTION      There were no vitals filed for this visit.   Subjective Assessment - 01/10/21 1348     Subjective Patient reports she thinks she needs to dial it down because she has had increased pain and has not been able to do any exercises since last PT session. States her current pain is 8/10 in the lateral R hip. Did have some increased pain in the anterior R hip region for the first couple of days but that went away and the pain is continuing to stay at the familiar  region of the lateral hip. She has been using some lidocain patches, heat, epson salt bath that did not provide usual relief. Did not try any exercise to help with pain. Feels the pressure after the needling is irritating and would not like to do dry needling today. pain after PT started the next day. She plans to try some ice after PT.    Pertinent History Patient is a 55 y.o. female who presents to outpatient physical therapy with a referral for medical diagnosis chronic pain of both hips, pain of both hip joints, low back pain, unspecified back pain laterality, unspecified chronicity, unspecified whether sciatica present,  trochanteric bursitis of both hips. This patient's chief complaints consist of chronic bilateral hip pain R > L in setting of chronic low back pain leading to the following functional deficits: difficulty with ADLs,  IADLs, activities that require weight bearing, walking, standing, stairs lifting, shaving legs, sleeping and decreased quality of life.   Relevant past medical history and comorbidities include chronic back pain, chronic neck pain, hypermobile in several joints, GERD, fatigue, depression/anxiety, CAD (smallest artery in heart - no stent recommended - sees cardiologist no restrictions), skin cancer (cleared up a few years ago).  Patient denies hx of stroke, seizures, lung problem, major cardiac events, diabetes, unexplained weight loss, changes in bowel or bladder problems, stumbling or dropping things, spinal surgeries, osteoporosis.    Limitations Standing;Walking;House hold activities   difficulty with ADLs, IADLs, activities that require weight bearing, walking, standing, stairs lifting, shaving legs, sleeping and decreased quality of life.   Diagnostic tests R&L hip MRI report 04/18/2018: "IMPRESSION:  1. On the right side, there is a partially torn distal gluteus  medius tendon with considerable tendinopathy in the gluteus medius  and mild gluteus minimus tendinopathy, as well as adjacent  trochanteric bursitis.  2. There is also a small right anterior superior labral tear which  appears nondisplaced.  3. On the left side there is distal gluteus medius moderate  tendinopathy and also mild proximal hamstring tendinopathy along  with mild trochanteric bursitis.  4. Spondylosis and degenerative disc disease at L5-S1.  5. Multiple chronic small cystic lesions along the vaginal cuff.  6. Sigmoid colon diverticulosis."    Currently in Pain? Yes    Pain Score 8     Pain Onset More than a month ago             OBJECTIVE FOTO = 38 (01/03/2021);     Therapeutic exercise: to centralize symptoms and improve ROM and strength required for successful completion of functional activities.  - hooklying isometric hip abduction against gait belt, 1x10, 5 second holds. - hooklying isometric hip abduction against yoga  block (longways), 2x15, 5 second holds. (stopped per patient preference). - hooklying wide stance bridge with, ankles dorsiflexed, 5 second hold, 1x10  (Manual therapy - see below) - hooklying abdominal brace with marching, 1x6 each side - isometric wall sit, 10x5 seconds red theraball on wall - supine short arc quad, with 5 second hold, 5# ankle weights, 1x10 each side.   Manual therapy: to reduce pain and tissue tension, improve range of motion, neuromodulation, in order to promote improved ability to complete functional activities. - hooklying LAD through left hip, 3x30-45 seconds, grade III-IV oscillations (decreased pain)    Pt required multimodal cuing for proper technique and to facilitate improved neuromuscular control, strength, range of motion, and functional ability resulting in improved performance and form.   HOME EXERCISE PROGRAM Access Code: [email or web address] URL:  [email or web address] Date: 12/15/2020 Prepared by: Rosita Kea   Exercises Hooklying Isometric Hip Abduction with Belt - 1 x daily - 1 sets - 20 reps - 5 second hold hold Supine Hip Adduction Isometric with Ball - 1 x daily - 1 sets - 20 reps - 5 seconds hold Bridge on Heels - 1 x daily - 2 sets - 10 reps - 5 seconds hold    PT Education - 01/10/21 1356     Education Details intervention/excercise technique/purpose. self-management techniques    Person(s) Educated Patient    Methods Explanation;Demonstration;Tactile cues;Verbal cues    Comprehension Verbalized understanding;Returned demonstration;Verbal cues required;Tactile cues required;Need further instruction              PT Short Term Goals - 01/03/21 1526       PT SHORT TERM GOAL #1   Title Be independent with initial home exercise program for self-management of symptoms.    Baseline Initial HEP to be provided at visit 2 (12/13/2020);    Time 3    Period Weeks    Status Achieved    Target Date 01/03/21               PT Long  Term Goals - 01/03/21 1526       PT LONG TERM GOAL #1   Title Be independent with a long-term home exercise program for self-management of symptoms.    Baseline Initial HEP to be provided at visit 2 (12/13/2020); currently participating in appropriate HEP (01/03/2021);    Time 12    Period Weeks    Status Partially Met   TARGET DATE FOR ALL LONG TERM GOALS: 03/07/2021     PT LONG TERM GOAL #2   Title Demonstrate improved FOTO score by 10 units to demonstrate improvement in overall condition and self-reported functional ability.    Baseline to be completed visit 2 as appropriate (12/13/2020); 31 (12/15/2020); 38 (01/03/2021);    Time 12    Period Weeks    Status Partially Met      PT LONG TERM GOAL #3   Title Improve B hip strength to 4+/5 without increase in pain for improved ability to allow patient to complete valued functional tasks such as stairs and walking with less difficulty.    Baseline weak and painful - see objective exam (12/13/2020);    Time 12    Period Weeks    Status On-going      PT LONG TERM GOAL #4   Title Patient will demonstrate single leg stance balance on firm surface with eyes open of equal or greater than 30 seconds each side to demonstrate improved proprioception and balance for functional activities such as walking, stairs, community and household mobility tasks.    Baseline < 5 seconds both sides (12/13/2020); R 14 seconds, L 21 seconds (01/03/2021);    Time 12    Period Weeks    Status Partially Met      PT LONG TERM GOAL #5   Title Complete community, work and/or recreational activities without limitation due to current condition.    Baseline Functional Limitations:difficulty with ADLs, IADLs, activities that require weight bearing, walking, standing, stairs lifting, shaving legs, sleeping, hiking (12/13/2020);    Time 12    Period Weeks    Status On-going                   Plan - 01/10/21 1427     Clinical Impression Statement Pateint presents with  elevated pain today with  onset the day after last PT session. Regressed exercises to accommodate pain and so as not to produce further pain. Mild temporary relief from long axis distraction. Reports she feels the same by end of session. Patient demonstrated delayed and exaggerated pain response to activity that may decrease PT effectiveness and may require slower progressions. Completed session a little early to decrease risk of too much input increasing pain. Patient would benefit from continued management of limiting condition by skilled physical therapist to address remaining impairments and functional limitations to work towards stated goals and return to PLOF or maximal functional independence.    Personal Factors and Comorbidities Comorbidity 3+;Time since onset of injury/illness/exacerbation;Past/Current Experience;Fitness    Comorbidities Relevant past medical history and comorbidities include chronic back pain, chronic neck pain, hypermobile in several joints, GERD, fatigue, depression/anxiety, CAD (smallest artery in heart - no stent recommended - sees cardiologist no restrictions), skin cancer (cleared up a few years ago), former smoker.    Examination-Activity Limitations Squat;Stairs;Lift;Locomotion Level;Stand;Caring for Others;Carry;Sleep    Examination-Participation Restrictions Laundry;Cleaning;Shop;Community Activity;Occupation;Interpersonal Relationship    Stability/Clinical Decision Making Evolving/Moderate complexity    Rehab Potential Good    PT Frequency 2x / week    PT Duration 12 weeks    PT Treatment/Interventions ADLs/Self Care Home Management;Aquatic Therapy;Biofeedback;Electrical Stimulation;Cryotherapy;Moist Heat;Gait training;Stair training;Functional mobility training;Therapeutic activities;Therapeutic exercise;Balance training;Neuromuscular re-education;Patient/family education;Manual techniques;Passive range of motion;Taping;Dry needling;Spinal Manipulations;Joint  Manipulations;Other (comment);Energy conservation    PT Next Visit Plan updated HEP as appropriate, LE strengthening as tolerated, dry needling    PT Home Exercise Plan Medbridge Access Code: [email or web address]    Consulted and Agree with Plan of Care Patient             Patient will benefit from skilled therapeutic intervention in order to improve the following deficits and impairments:  Improper body mechanics, Pain, Decreased mobility, Hypermobility, Increased muscle spasms, Postural dysfunction, Decreased activity tolerance, Decreased endurance, Decreased strength, Impaired perceived functional ability, Difficulty walking, Abnormal gait, Decreased balance  Visit Diagnosis: Pain in right hip  Pain in left hip  Chronic bilateral low back pain, unspecified whether sciatica present  Difficulty in walking, not elsewhere classified  Muscle weakness (generalized)  Other muscle spasm     Problem List Patient Active Problem List   Diagnosis Date Noted   DDD (degenerative disc disease), lumbar 01/06/2021   Facet arthritis of lumbar region 01/06/2021   Trochanteric bursitis of both hips 11/30/2020   Special screening for malignant neoplasms, colon    Polyp of cecum    Pain in both feet 04/12/2020   Burning sensation of feet 04/12/2020   Pain of both hip joints 04/12/2020   Onychomycosis 04/12/2020   Fibrocystic breast disease (FCBD) 04/12/2020   Numbness in both legs 09/09/2019   Spinal stenosis in cervical region 05/09/2019   Facet arthropathy, cervical 05/09/2019   Chronic pain 11/27/2018   Obesity (BMI 30.0-34.9) 11/27/2018   Sprain and strain of hip and thigh 05/10/2018   Abnormal MRI, cervical spine 03/28/2018   Menopause 11/26/2017   Right upper quadrant pain 11/26/2017   Abnormal Pap smear of cervix 07/27/2017   Vasomotor flushing 07/19/2017   Depression 07/18/2017   Nausea 06/25/2017   Chronic pain of both hips 06/25/2017   OSA (obstructive sleep apnea) 04/18/2017    Annual physical exam 04/10/2017   Fatigue 12/16/2016   Chronic back pain 05/30/2016   Abdominal pain 03/12/2016   Anxiety and depression 12/28/2015   History of obstructive sleep apnea 12/15/2015   GERD (gastroesophageal reflux disease)  12/15/2015   Hyperlipidemia 12/15/2015   Pain medication agreement signed 03/25/2015   CAD in native artery 02/05/2015   Right supraspinatus tenosynovitis 08/17/2014   Crohn's disease involving terminal ileum ([place]) 03/30/2014   FH: colon polyps 03/30/2014   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 Graylon Good, PT, DPT 01/10/21, 2:30 PM   [facility] [address], [place], [address] Phone: [phone]   Fax:  [phone]  Name: Carolyn Esparza MRN: [id number] Date of Birth: July 13, 1966

## 2021-01-12 ENCOUNTER — Encounter: Payer: Self-pay | Admitting: Physical Therapy

## 2021-01-12 ENCOUNTER — Ambulatory Visit: Payer: Managed Care, Other (non HMO) | Admitting: Physical Therapy

## 2021-01-12 ENCOUNTER — Other Ambulatory Visit: Payer: Self-pay

## 2021-01-12 DIAGNOSIS — M6281 Muscle weakness (generalized): Secondary | ICD-10-CM

## 2021-01-12 DIAGNOSIS — G8929 Other chronic pain: Secondary | ICD-10-CM

## 2021-01-12 DIAGNOSIS — M25552 Pain in left hip: Secondary | ICD-10-CM

## 2021-01-12 DIAGNOSIS — M25551 Pain in right hip: Secondary | ICD-10-CM

## 2021-01-12 DIAGNOSIS — M545 Low back pain, unspecified: Secondary | ICD-10-CM

## 2021-01-12 DIAGNOSIS — R262 Difficulty in walking, not elsewhere classified: Secondary | ICD-10-CM

## 2021-01-12 DIAGNOSIS — M62838 Other muscle spasm: Secondary | ICD-10-CM

## 2021-01-12 NOTE — Therapy (Signed)
[facility] [address] [place], [address] Phone: [phone]   Fax:  [phone]  Physical Therapy Treatment  Patient Details  Name: Carolyn Esparza MRN: [id number] Date of Birth: 04/25/1966 Referring Provider (PT): [name], [name], MD   Encounter Date: 01/12/2021   PT End of Session - 01/12/21 1403     Visit Number 7    Number of Visits 24    Date for PT Re-Evaluation 03/07/21    Authorization Type [employer] reporting period from 12/13/2020    Authorization Time Period 60 visits Per calendar year. Auth is not requried/ref number [id number]    Authorization - Visit Number 7    Authorization - Number of Visits 60    Progress Note Due on Visit 10    PT Start Time 1350    PT Stop Time 1430    PT Time Calculation (min) 40 min    Activity Tolerance Patient tolerated treatment well;Patient limited by pain    Behavior During Therapy WFL for tasks assessed/performed             Past Medical History:  Diagnosis Date   Anxiety    ASCUS with positive high risk HPV cervical    CAD (coronary artery disease)    Chicken pox    Chronic pain    neck, back, b/l hips    Complication of anesthesia    DDD (degenerative disc disease), lumbar 01/06/2021   Depression    Facet arthritis of lumbar region 01/06/2021   GERD (gastroesophageal reflux disease)    History of Crohn's disease    Hyperlipidemia    Left ovarian cyst    s/p removal of 1 ovary ? which one removed per pt    Libido, decreased    Skin cancer    reports skin cancer removed from skin in 2016 or 2017   Thyroid nodule    repeat US resolved and Ct neck 05/2019 normal thyroid     Past Surgical History:  Procedure Laterality Date   ABDOMINAL HYSTERECTOMY     APPENDECTOMY  2004   BLADDER SURGERY     BREAST BIOPSY     x 2   COLONOSCOPY     COLONOSCOPY WITH PROPOFOL N/A 10/25/2020   Procedure: COLONOSCOPY WITH PROPOFOL;  Surgeon: Mauri Pole,  MD;  Location: [facility];  Service: Endoscopy;  Laterality: N/A;   HEMOSTASIS CLIP PLACEMENT  10/25/2020   Procedure: HEMOSTASIS CLIP PLACEMENT;  Surgeon: Mauri Pole, MD;  Location: [facility];  Service: Endoscopy;;   OOPHORECTOMY Right    Unilateral Right (per MRI 04/17/2018)   POLYPECTOMY  10/25/2020   Procedure: POLYPECTOMY;  Surgeon: Mauri Pole, MD;  Location: [facility];  Service: Endoscopy;;   SUBMUCOSAL LIFTING INJECTION  10/25/2020   Procedure: SUBMUCOSAL LIFTING INJECTION;  Surgeon: Mauri Pole, MD;  Location: [facility];  Service: Endoscopy;;   WISDOM TOOTH EXTRACTION      There were no vitals filed for this visit.   Subjective Assessment - 01/12/21 1354     Subjective Patient reports she has 6/10 pain in the right glute region. States she has notices that since last week she cannot cross her R leg over her left in a figure 4 position due to pain. Last week she could do this but it was painful. States she felt a lot better yesterday morning but pain came back to usual by end of day with the more she did. Did her  HEP last night 15 reps of each and it went well.    Pertinent History Patient is a 55 y.o. female who presents to outpatient physical therapy with a referral for medical diagnosis chronic pain of both hips, pain of both hip joints, low back pain, unspecified back pain laterality, unspecified chronicity, unspecified whether sciatica present,  trochanteric bursitis of both hips. This patient's chief complaints consist of chronic bilateral hip pain R > L in setting of chronic low back pain leading to the following functional deficits: difficulty with ADLs, IADLs, activities that require weight bearing, walking, standing, stairs lifting, shaving legs, sleeping and decreased quality of life.   Relevant past medical history and comorbidities include chronic back pain, chronic neck pain, hypermobile in several joints, GERD, fatigue, depression/anxiety, CAD  (smallest artery in heart - no stent recommended - sees cardiologist no restrictions), skin cancer (cleared up a few years ago).  Patient denies hx of stroke, seizures, lung problem, major cardiac events, diabetes, unexplained weight loss, changes in bowel or bladder problems, stumbling or dropping things, spinal surgeries, osteoporosis.    Limitations Standing;Walking;House hold activities   difficulty with ADLs, IADLs, activities that require weight bearing, walking, standing, stairs lifting, shaving legs, sleeping and decreased quality of life.   Diagnostic tests R&L hip MRI report 04/18/2018: "IMPRESSION:  1. On the right side, there is a partially torn distal gluteus  medius tendon with considerable tendinopathy in the gluteus medius  and mild gluteus minimus tendinopathy, as well as adjacent  trochanteric bursitis.  2. There is also a small right anterior superior labral tear which  appears nondisplaced.  3. On the left side there is distal gluteus medius moderate  tendinopathy and also mild proximal hamstring tendinopathy along  with mild trochanteric bursitis.  4. Spondylosis and degenerative disc disease at L5-S1.  5. Multiple chronic small cystic lesions along the vaginal cuff.  6. Sigmoid colon diverticulosis."    Currently in Pain? Yes    Pain Score 6     Pain Location Hip    Pain Orientation Right    Pain Onset More than a month ago             OBJECTIVE FOTO = 38 (01/03/2021);     Therapeutic exercise: to centralize symptoms and improve ROM and strength required for successful completion of functional activities.  - SLR = negative for concordant pain on both sides.  (Manual therapy - see below) - hooklying isometric hip abduction against yoga block (longways), 2x15, 5 second holds. - hooklying isometric hip abduction against gait belt, 2x15, 5 second holds. - hooklying wide stance bridge with, ankles dorsiflexed, 5 second hold, 1x15 - hooklying abdominal brace with marching, 2x10  each side  - supine short arc quad, with 5 second hold, 5# ankle weights, 1x10 each side.     Manual therapy: to reduce pain and tissue tension, improve range of motion, neuromodulation, in order to promote improved ability to complete functional activities. - hooklying LAD through left hip, 3x30 seconds each side, grade III-IV oscillations (decreased pain)    Pt required multimodal cuing for proper technique and to facilitate improved neuromuscular control, strength, range of motion, and functional ability resulting in improved performance and form.   HOME EXERCISE PROGRAM Access Code: [email or web address] URL: [email or web address] Date: 12/15/2020 Prepared by: Rosita Kea   Exercises Hooklying Isometric Hip Abduction with Belt - 1 x daily - 1 sets - 20 reps - 5 second hold hold Supine Hip Adduction Isometric with Ball - 1  x daily - 1 sets - 20 reps - 5 seconds hold Bridge on Heels - 1 x daily - 2 sets - 10 reps - 5 seconds hold      PT Education - 01/12/21 1403     Education Details intervention/excercise technique/purpose. self-management techniques    Person(s) Educated Patient    Methods Explanation;Demonstration;Tactile cues;Verbal cues    Comprehension Verbalized understanding;Returned demonstration;Verbal cues required;Tactile cues required;Need further instruction              PT Short Term Goals - 01/03/21 1526       PT SHORT TERM GOAL #1   Title Be independent with initial home exercise program for self-management of symptoms.    Baseline Initial HEP to be provided at visit 2 (12/13/2020);    Time 3    Period Weeks    Status Achieved    Target Date 01/03/21               PT Long Term Goals - 01/03/21 1526       PT LONG TERM GOAL #1   Title Be independent with a long-term home exercise program for self-management of symptoms.    Baseline Initial HEP to be provided at visit 2 (12/13/2020); currently participating in appropriate HEP (01/03/2021);     Time 12    Period Weeks    Status Partially Met   TARGET DATE FOR ALL LONG TERM GOALS: 03/07/2021     PT LONG TERM GOAL #2   Title Demonstrate improved FOTO score by 10 units to demonstrate improvement in overall condition and self-reported functional ability.    Baseline to be completed visit 2 as appropriate (12/13/2020); 31 (12/15/2020); 38 (01/03/2021);    Time 12    Period Weeks    Status Partially Met      PT LONG TERM GOAL #3   Title Improve B hip strength to 4+/5 without increase in pain for improved ability to allow patient to complete valued functional tasks such as stairs and walking with less difficulty.    Baseline weak and painful - see objective exam (12/13/2020);    Time 12    Period Weeks    Status On-going      PT LONG TERM GOAL #4   Title Patient will demonstrate single leg stance balance on firm surface with eyes open of equal or greater than 30 seconds each side to demonstrate improved proprioception and balance for functional activities such as walking, stairs, community and household mobility tasks.    Baseline < 5 seconds both sides (12/13/2020); R 14 seconds, L 21 seconds (01/03/2021);    Time 12    Period Weeks    Status Partially Met      PT LONG TERM GOAL #5   Title Complete community, work and/or recreational activities without limitation due to current condition.    Baseline Functional Limitations:difficulty with ADLs, IADLs, activities that require weight bearing, walking, standing, stairs lifting, shaving legs, sleeping, hiking (12/13/2020);    Time 12    Period Weeks    Status On-going                   Plan - 01/12/21 1421     Clinical Impression Statement Patient tolerated treatment better this session than previously. Repeated LAD due to report of relief following last treatment session.  Discussed pacing, spoon technique, etc this session. Patient would benefit from continued management of limiting condition by skilled physical therapist to  address remaining impairments  and functional limitations to work towards stated goals and return to PLOF or maximal functional independence.    Personal Factors and Comorbidities Comorbidity 3+;Time since onset of injury/illness/exacerbation;Past/Current Experience;Fitness    Comorbidities Relevant past medical history and comorbidities include chronic back pain, chronic neck pain, hypermobile in several joints, GERD, fatigue, depression/anxiety, CAD (smallest artery in heart - no stent recommended - sees cardiologist no restrictions), skin cancer (cleared up a few years ago), former smoker.    Examination-Activity Limitations Squat;Stairs;Lift;Locomotion Level;Stand;Caring for Others;Carry;Sleep    Examination-Participation Restrictions Laundry;Cleaning;Shop;Community Activity;Occupation;Interpersonal Relationship    Stability/Clinical Decision Making Evolving/Moderate complexity    Rehab Potential Good    PT Frequency 2x / week    PT Duration 12 weeks    PT Treatment/Interventions ADLs/Self Care Home Management;Aquatic Therapy;Biofeedback;Electrical Stimulation;Cryotherapy;Moist Heat;Gait training;Stair training;Functional mobility training;Therapeutic activities;Therapeutic exercise;Balance training;Neuromuscular re-education;Patient/family education;Manual techniques;Passive range of motion;Taping;Dry needling;Spinal Manipulations;Joint Manipulations;Other (comment);Energy conservation    PT Next Visit Plan updated HEP as appropriate, LE strengthening as tolerated, dry needling, pacing    PT Home Exercise Plan Medbridge Access Code: [email or web address]    Consulted and Agree with Plan of Care Patient             Patient will benefit from skilled therapeutic intervention in order to improve the following deficits and impairments:  Improper body mechanics, Pain, Decreased mobility, Hypermobility, Increased muscle spasms, Postural dysfunction, Decreased activity tolerance, Decreased endurance, Decreased  strength, Impaired perceived functional ability, Difficulty walking, Abnormal gait, Decreased balance  Visit Diagnosis: Pain in right hip  Pain in left hip  Chronic bilateral low back pain, unspecified whether sciatica present  Difficulty in walking, not elsewhere classified  Muscle weakness (generalized)  Other muscle spasm     Problem List Patient Active Problem List   Diagnosis Date Noted   DDD (degenerative disc disease), lumbar 01/06/2021   Facet arthritis of lumbar region 01/06/2021   Trochanteric bursitis of both hips 11/30/2020   Special screening for malignant neoplasms, colon    Polyp of cecum    Pain in both feet 04/12/2020   Burning sensation of feet 04/12/2020   Pain of both hip joints 04/12/2020   Onychomycosis 04/12/2020   Fibrocystic breast disease (FCBD) 04/12/2020   Numbness in both legs 09/09/2019   Spinal stenosis in cervical region 05/09/2019   Facet arthropathy, cervical 05/09/2019   Chronic pain 11/27/2018   Obesity (BMI 30.0-34.9) 11/27/2018   Sprain and strain of hip and thigh 05/10/2018   Abnormal MRI, cervical spine 03/28/2018   Menopause 11/26/2017   Right upper quadrant pain 11/26/2017   Abnormal Pap smear of cervix 07/27/2017   Vasomotor flushing 07/19/2017   Depression 07/18/2017   Nausea 06/25/2017   Chronic pain of both hips 06/25/2017   OSA (obstructive sleep apnea) 04/18/2017   Annual physical exam 04/10/2017   Fatigue 12/16/2016   Chronic back pain 05/30/2016   Abdominal pain 03/12/2016   Anxiety and depression 12/28/2015   History of obstructive sleep apnea 12/15/2015   GERD (gastroesophageal reflux disease) 12/15/2015   Hyperlipidemia 12/15/2015   Pain medication agreement signed 03/25/2015   CAD in native artery 02/05/2015   Right supraspinatus tenosynovitis 08/17/2014   Crohn's disease involving terminal ileum ([place]) 03/30/2014   FH: colon polyps 03/30/2014   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 Graylon Good, PT, DPT 01/12/21, 2:35 PM   [facility] [address], [place], [address] Phone: [phone]   Fax:  [phone]  Name: Rachella Basden MRN: [id number] Date of Birth: 03/08/1966

## 2021-01-17 ENCOUNTER — Encounter: Payer: Self-pay | Admitting: Physical Therapy

## 2021-01-17 ENCOUNTER — Ambulatory Visit: Payer: Managed Care, Other (non HMO) | Admitting: Physical Therapy

## 2021-01-17 ENCOUNTER — Other Ambulatory Visit: Payer: Self-pay

## 2021-01-17 DIAGNOSIS — G8929 Other chronic pain: Secondary | ICD-10-CM

## 2021-01-17 DIAGNOSIS — M545 Low back pain, unspecified: Secondary | ICD-10-CM

## 2021-01-17 DIAGNOSIS — M62838 Other muscle spasm: Secondary | ICD-10-CM

## 2021-01-17 DIAGNOSIS — M25551 Pain in right hip: Secondary | ICD-10-CM

## 2021-01-17 DIAGNOSIS — M25552 Pain in left hip: Secondary | ICD-10-CM

## 2021-01-17 DIAGNOSIS — M6281 Muscle weakness (generalized): Secondary | ICD-10-CM

## 2021-01-17 DIAGNOSIS — R262 Difficulty in walking, not elsewhere classified: Secondary | ICD-10-CM

## 2021-01-17 NOTE — Therapy (Signed)
[facility] [address] [place], [address] Phone: [phone]   Fax:  [phone]  Physical Therapy Treatment  Patient Details  Name: Carolyn Esparza MRN: [id number] Date of Birth: 1966-07-25 Referring Provider (PT): [name], [name], MD   Encounter Date: 01/17/2021   PT End of Session - 01/17/21 1348     Visit Number 8    Number of Visits 24    Date for PT Re-Evaluation 03/07/21    Authorization Type [employer] reporting period from 12/13/2020    Authorization Time Period 60 visits Per calendar year. Auth is not requried/ref number [id number]    Authorization - Visit Number 8    Authorization - Number of Visits 60    Progress Note Due on Visit 10    PT Start Time 5102    PT Stop Time 1425    PT Time Calculation (min) 40 min    Activity Tolerance Patient tolerated treatment well;Patient limited by pain    Behavior During Therapy WFL for tasks assessed/performed             Past Medical History:  Diagnosis Date   Anxiety    ASCUS with positive high risk HPV cervical    CAD (coronary artery disease)    Chicken pox    Chronic pain    neck, back, b/l hips    Complication of anesthesia    DDD (degenerative disc disease), lumbar 01/06/2021   Depression    Facet arthritis of lumbar region 01/06/2021   GERD (gastroesophageal reflux disease)    History of Crohn's disease    Hyperlipidemia    Left ovarian cyst    s/p removal of 1 ovary ? which one removed per pt    Libido, decreased    Skin cancer    reports skin cancer removed from skin in 2016 or 2017   Thyroid nodule    repeat US resolved and Ct neck 05/2019 normal thyroid     Past Surgical History:  Procedure Laterality Date   ABDOMINAL HYSTERECTOMY     APPENDECTOMY  2004   BLADDER SURGERY     BREAST BIOPSY     x 2   COLONOSCOPY     COLONOSCOPY WITH PROPOFOL N/A 10/25/2020   Procedure: COLONOSCOPY WITH PROPOFOL;  Surgeon: Mauri Pole,  MD;  Location: [facility];  Service: Endoscopy;  Laterality: N/A;   HEMOSTASIS CLIP PLACEMENT  10/25/2020   Procedure: HEMOSTASIS CLIP PLACEMENT;  Surgeon: Mauri Pole, MD;  Location: [facility];  Service: Endoscopy;;   OOPHORECTOMY Right    Unilateral Right (per MRI 04/17/2018)   POLYPECTOMY  10/25/2020   Procedure: POLYPECTOMY;  Surgeon: Mauri Pole, MD;  Location: [facility];  Service: Endoscopy;;   SUBMUCOSAL LIFTING INJECTION  10/25/2020   Procedure: SUBMUCOSAL LIFTING INJECTION;  Surgeon: Mauri Pole, MD;  Location: [facility];  Service: Endoscopy;;   WISDOM TOOTH EXTRACTION      There were no vitals filed for this visit.   Subjective Assessment - 01/17/21 1345     Subjective Patient states she is doing okay today. Did not do her HEP 2 days last week because she had a lot going on. She states she is tired from not sleeping well. She keeps waking up in pain or too hot. Pain no worse with her HEP when performed since last PT session. Patient reports current pain is 7/10 at her right lanterolateral hip. Knees are also hurting after limping. Seemd  to help when she did not do much on saturday.    Pertinent History Patient is a 55 y.o. female who presents to outpatient physical therapy with a referral for medical diagnosis chronic pain of both hips, pain of both hip joints, low back pain, unspecified back pain laterality, unspecified chronicity, unspecified whether sciatica present,  trochanteric bursitis of both hips. This patient's chief complaints consist of chronic bilateral hip pain R > L in setting of chronic low back pain leading to the following functional deficits: difficulty with ADLs, IADLs, activities that require weight bearing, walking, standing, stairs lifting, shaving legs, sleeping and decreased quality of life.   Relevant past medical history and comorbidities include chronic back pain, chronic neck pain, hypermobile in several joints, GERD, fatigue,  depression/anxiety, CAD (smallest artery in heart - no stent recommended - sees cardiologist no restrictions), skin cancer (cleared up a few years ago).  Patient denies hx of stroke, seizures, lung problem, major cardiac events, diabetes, unexplained weight loss, changes in bowel or bladder problems, stumbling or dropping things, spinal surgeries, osteoporosis.    Limitations Standing;Walking;House hold activities   difficulty with ADLs, IADLs, activities that require weight bearing, walking, standing, stairs lifting, shaving legs, sleeping and decreased quality of life.   Diagnostic tests R&L hip MRI report 04/18/2018: "IMPRESSION:  1. On the right side, there is a partially torn distal gluteus  medius tendon with considerable tendinopathy in the gluteus medius  and mild gluteus minimus tendinopathy, as well as adjacent  trochanteric bursitis.  2. There is also a small right anterior superior labral tear which  appears nondisplaced.  3. On the left side there is distal gluteus medius moderate  tendinopathy and also mild proximal hamstring tendinopathy along  with mild trochanteric bursitis.  4. Spondylosis and degenerative disc disease at L5-S1.  5. Multiple chronic small cystic lesions along the vaginal cuff.  6. Sigmoid colon diverticulosis."    Currently in Pain? Yes    Pain Score 7     Pain Location Hip    Pain Orientation Right    Pain Onset More than a month ago             OBJECTIVE FOTO = 33 (01/17/2021);     Therapeutic exercise: to centralize symptoms and improve ROM and strength required for successful completion of functional activities.   Circuit: - hooklying isometric hip abduction against yoga block (longways), 2x15, 5 second holds. - hooklying isometric hip abduction against gait belt, 2x15, 5 second holds. - hooklying wide stance bridge with, ankles dorsiflexed, 5 second hold, 2x15  - hooklying abdominal brace with marching, 2x10 each side - supine short arc quad, with 5  second hold, 7.5# ankle weights, 1x10 each side.      Manual therapy: to reduce pain and tissue tension, improve range of motion, neuromodulation, in order to promote improved ability to complete functional activities. - hooklying LAD through left hip, 3x30 seconds each side to tolerance - hooklying/supine STM to right TFL/proximal quads    Pt required multimodal cuing for proper technique and to facilitate improved neuromuscular control, strength, range of motion, and functional ability resulting in improved performance and form.   HOME EXERCISE PROGRAM Access Code: [email or web address] URL: [email or web address] Date: 12/15/2020 Prepared by: Rosita Kea   Exercises Hooklying Isometric Hip Abduction with Belt - 1 x daily - 1 sets - 20 reps - 5 second hold hold Supine Hip Adduction Isometric with Ball - 1 x daily - 1 sets - 20 reps - 5  seconds hold Bridge on Heels - 1 x daily - 2 sets - 10 reps - 5 seconds hold     PT Education - 01/17/21 1348     Education Details intervention/excercise technique/purpose. self-management techniques    Person(s) Educated Patient    Methods Explanation;Demonstration;Tactile cues;Verbal cues    Comprehension Verbalized understanding;Returned demonstration;Verbal cues required;Tactile cues required;Need further instruction              PT Short Term Goals - 01/03/21 1526       PT SHORT TERM GOAL #1   Title Be independent with initial home exercise program for self-management of symptoms.    Baseline Initial HEP to be provided at visit 2 (12/13/2020);    Time 3    Period Weeks    Status Achieved    Target Date 01/03/21               PT Long Term Goals - 01/03/21 1526       PT LONG TERM GOAL #1   Title Be independent with a long-term home exercise program for self-management of symptoms.    Baseline Initial HEP to be provided at visit 2 (12/13/2020); currently participating in appropriate HEP (01/03/2021);    Time 12    Period  Weeks    Status Partially Met   TARGET DATE FOR ALL LONG TERM GOALS: 03/07/2021     PT LONG TERM GOAL #2   Title Demonstrate improved FOTO score by 10 units to demonstrate improvement in overall condition and self-reported functional ability.    Baseline to be completed visit 2 as appropriate (12/13/2020); 31 (12/15/2020); 38 (01/03/2021);    Time 12    Period Weeks    Status Partially Met      PT LONG TERM GOAL #3   Title Improve B hip strength to 4+/5 without increase in pain for improved ability to allow patient to complete valued functional tasks such as stairs and walking with less difficulty.    Baseline weak and painful - see objective exam (12/13/2020);    Time 12    Period Weeks    Status On-going      PT LONG TERM GOAL #4   Title Patient will demonstrate single leg stance balance on firm surface with eyes open of equal or greater than 30 seconds each side to demonstrate improved proprioception and balance for functional activities such as walking, stairs, community and household mobility tasks.    Baseline < 5 seconds both sides (12/13/2020); R 14 seconds, L 21 seconds (01/03/2021);    Time 12    Period Weeks    Status Partially Met      PT LONG TERM GOAL #5   Title Complete community, work and/or recreational activities without limitation due to current condition.    Baseline Functional Limitations:difficulty with ADLs, IADLs, activities that require weight bearing, walking, standing, stairs lifting, shaving legs, sleeping, hiking (12/13/2020);    Time 12    Period Weeks    Status On-going                   Plan - 01/17/21 1430     Clinical Impression Statement Pateint tolerated treatment well with no increased pain with exercise today but patient slightly apprehensive about how it will feel tomorrow. Review R hip MRI from 2019 which showed small right anterior superior labral tear that was nondisplaced at the tim and partial torn distal glute med tendon with considerable  tendinopathy in glute med and  mild gute min tendinopathy. Continued with isometric loading and manual therapy for pain control and improved tissue remodeling as tolerated. Patient would benefit from continued management of limiting condition by skilled physical therapist to address remaining impairments and functional limitations to work towards stated goals and return to PLOF or maximal functional independence.    Personal Factors and Comorbidities Comorbidity 3+;Time since onset of injury/illness/exacerbation;Past/Current Experience;Fitness    Comorbidities Relevant past medical history and comorbidities include chronic back pain, chronic neck pain, hypermobile in several joints, GERD, fatigue, depression/anxiety, CAD (smallest artery in heart - no stent recommended - sees cardiologist no restrictions), skin cancer (cleared up a few years ago), former smoker.    Examination-Activity Limitations Squat;Stairs;Lift;Locomotion Level;Stand;Caring for Others;Carry;Sleep    Examination-Participation Restrictions Laundry;Cleaning;Shop;Community Activity;Occupation;Interpersonal Relationship    Stability/Clinical Decision Making Evolving/Moderate complexity    Rehab Potential Good    PT Frequency 2x / week    PT Duration 12 weeks    PT Treatment/Interventions ADLs/Self Care Home Management;Aquatic Therapy;Biofeedback;Electrical Stimulation;Cryotherapy;Moist Heat;Gait training;Stair training;Functional mobility training;Therapeutic activities;Therapeutic exercise;Balance training;Neuromuscular re-education;Patient/family education;Manual techniques;Passive range of motion;Taping;Dry needling;Spinal Manipulations;Joint Manipulations;Other (comment);Energy conservation    PT Next Visit Plan updated HEP as appropriate, LE strengthening as tolerated, dry needling, pacing    PT Home Exercise Plan Medbridge Access Code: [email or web address]    Consulted and Agree with Plan of Care Patient             Patient will benefit  from skilled therapeutic intervention in order to improve the following deficits and impairments:  Improper body mechanics, Pain, Decreased mobility, Hypermobility, Increased muscle spasms, Postural dysfunction, Decreased activity tolerance, Decreased endurance, Decreased strength, Impaired perceived functional ability, Difficulty walking, Abnormal gait, Decreased balance  Visit Diagnosis: Pain in right hip  Pain in left hip  Chronic bilateral low back pain, unspecified whether sciatica present  Difficulty in walking, not elsewhere classified  Muscle weakness (generalized)  Other muscle spasm     Problem List Patient Active Problem List   Diagnosis Date Noted   DDD (degenerative disc disease), lumbar 01/06/2021   Facet arthritis of lumbar region 01/06/2021   Trochanteric bursitis of both hips 11/30/2020   Special screening for malignant neoplasms, colon    Polyp of cecum    Pain in both feet 04/12/2020   Burning sensation of feet 04/12/2020   Pain of both hip joints 04/12/2020   Onychomycosis 04/12/2020   Fibrocystic breast disease (FCBD) 04/12/2020   Numbness in both legs 09/09/2019   Spinal stenosis in cervical region 05/09/2019   Facet arthropathy, cervical 05/09/2019   Chronic pain 11/27/2018   Obesity (BMI 30.0-34.9) 11/27/2018   Sprain and strain of hip and thigh 05/10/2018   Abnormal MRI, cervical spine 03/28/2018   Menopause 11/26/2017   Right upper quadrant pain 11/26/2017   Abnormal Pap smear of cervix 07/27/2017   Vasomotor flushing 07/19/2017   Depression 07/18/2017   Nausea 06/25/2017   Chronic pain of both hips 06/25/2017   OSA (obstructive sleep apnea) 04/18/2017   Annual physical exam 04/10/2017   Fatigue 12/16/2016   Chronic back pain 05/30/2016   Abdominal pain 03/12/2016   Anxiety and depression 12/28/2015   History of obstructive sleep apnea 12/15/2015   GERD (gastroesophageal reflux disease) 12/15/2015   Hyperlipidemia 12/15/2015   Pain  medication agreement signed 03/25/2015   CAD in native artery 02/05/2015   Right supraspinatus tenosynovitis 08/17/2014   Crohn's disease involving terminal ileum (HCC) 03/30/2014   FH: colon polyps 03/30/2014   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 Graylon Good, PT, DPT 01/17/21, 3:13 PM   [facility] [address] [place], [address] Phone: [phone]   Fax:  [phone]  Name: Carolyn Esparza MRN: [id number] Date of Birth: 1965-08-14

## 2021-01-19 ENCOUNTER — Encounter: Payer: Self-pay | Admitting: Physical Therapy

## 2021-01-19 ENCOUNTER — Ambulatory Visit: Payer: Managed Care, Other (non HMO) | Admitting: Physical Therapy

## 2021-01-19 DIAGNOSIS — M25551 Pain in right hip: Secondary | ICD-10-CM

## 2021-01-19 DIAGNOSIS — M25552 Pain in left hip: Secondary | ICD-10-CM

## 2021-01-19 DIAGNOSIS — M6281 Muscle weakness (generalized): Secondary | ICD-10-CM

## 2021-01-19 DIAGNOSIS — M545 Low back pain, unspecified: Secondary | ICD-10-CM

## 2021-01-19 DIAGNOSIS — R262 Difficulty in walking, not elsewhere classified: Secondary | ICD-10-CM

## 2021-01-19 DIAGNOSIS — M62838 Other muscle spasm: Secondary | ICD-10-CM

## 2021-01-19 DIAGNOSIS — G8929 Other chronic pain: Secondary | ICD-10-CM

## 2021-01-19 NOTE — Therapy (Signed)
[facility] [address], [place], [address] Phone: [phone]   Fax:  [phone]  Physical Therapy Treatment  Patient Details  Name: Carolyn Esparza MRN: [id number] Date of Birth: February 17, 1966 Referring Provider (PT): [name], [name], MD   Encounter Date: 01/19/2021   PT End of Session - 01/19/21 1354     Visit Number 9    Number of Visits 24    Date for PT Re-Evaluation 03/07/21    Authorization Type [employer] reporting period from 12/13/2020    Authorization Time Period 60 visits Per calendar year. Auth is not requried/ref number [id number]    Authorization - Visit Number 9    Authorization - Number of Visits 60    Progress Note Due on Visit 10    PT Start Time 5038    PT Stop Time 1429    PT Time Calculation (min) 40 min    Activity Tolerance Patient tolerated treatment well;Patient limited by pain    Behavior During Therapy WFL for tasks assessed/performed             Past Medical History:  Diagnosis Date   Anxiety    ASCUS with positive high risk HPV cervical    CAD (coronary artery disease)    Chicken pox    Chronic pain    neck, back, b/l hips    Complication of anesthesia    DDD (degenerative disc disease), lumbar 01/06/2021   Depression    Facet arthritis of lumbar region 01/06/2021   GERD (gastroesophageal reflux disease)    History of Crohn's disease    Hyperlipidemia    Left ovarian cyst    s/p removal of 1 ovary ? which one removed per pt    Libido, decreased    Skin cancer    reports skin cancer removed from skin in 2016 or 2017   Thyroid nodule    repeat US resolved and Ct neck 05/2019 normal thyroid     Past Surgical History:  Procedure Laterality Date   ABDOMINAL HYSTERECTOMY     APPENDECTOMY  2004   BLADDER SURGERY     BREAST BIOPSY     x 2   COLONOSCOPY     COLONOSCOPY WITH PROPOFOL N/A 10/25/2020   Procedure: COLONOSCOPY WITH PROPOFOL;  Surgeon: Mauri Pole,  MD;  Location: [facility];  Service: Endoscopy;  Laterality: N/A;   HEMOSTASIS CLIP PLACEMENT  10/25/2020   Procedure: HEMOSTASIS CLIP PLACEMENT;  Surgeon: Mauri Pole, MD;  Location: [facility];  Service: Endoscopy;;   OOPHORECTOMY Right    Unilateral Right (per MRI 04/17/2018)   POLYPECTOMY  10/25/2020   Procedure: POLYPECTOMY;  Surgeon: Mauri Pole, MD;  Location: [facility];  Service: Endoscopy;;   SUBMUCOSAL LIFTING INJECTION  10/25/2020   Procedure: SUBMUCOSAL LIFTING INJECTION;  Surgeon: Mauri Pole, MD;  Location: [facility];  Service: Endoscopy;;   WISDOM TOOTH EXTRACTION      There were no vitals filed for this visit.   Subjective Assessment - 01/19/21 1352     Subjective Patient report she is feeling okay today. States she felt okay after last PT session but did only one set of HEP yesterday. Rates pain as 7/10 at right lateral posterior hip.    Pertinent History Patient is a 55 y.o. female who presents to outpatient physical therapy with a referral for medical diagnosis chronic pain of both hips, pain of both hip joints, low back pain, unspecified back pain  laterality, unspecified chronicity, unspecified whether sciatica present,  trochanteric bursitis of both hips. This patient's chief complaints consist of chronic bilateral hip pain R > L in setting of chronic low back pain leading to the following functional deficits: difficulty with ADLs, IADLs, activities that require weight bearing, walking, standing, stairs lifting, shaving legs, sleeping and decreased quality of life.   Relevant past medical history and comorbidities include chronic back pain, chronic neck pain, hypermobile in several joints, GERD, fatigue, depression/anxiety, CAD (smallest artery in heart - no stent recommended - sees cardiologist no restrictions), skin cancer (cleared up a few years ago).  Patient denies hx of stroke, seizures, lung problem, major cardiac events, diabetes,  unexplained weight loss, changes in bowel or bladder problems, stumbling or dropping things, spinal surgeries, osteoporosis.    Limitations Standing;Walking;House hold activities   difficulty with ADLs, IADLs, activities that require weight bearing, walking, standing, stairs lifting, shaving legs, sleeping and decreased quality of life.   Diagnostic tests R&L hip MRI report 04/18/2018: "IMPRESSION:  1. On the right side, there is a partially torn distal gluteus  medius tendon with considerable tendinopathy in the gluteus medius  and mild gluteus minimus tendinopathy, as well as adjacent  trochanteric bursitis.  2. There is also a small right anterior superior labral tear which  appears nondisplaced.  3. On the left side there is distal gluteus medius moderate  tendinopathy and also mild proximal hamstring tendinopathy along  with mild trochanteric bursitis.  4. Spondylosis and degenerative disc disease at L5-S1.  5. Multiple chronic small cystic lesions along the vaginal cuff.  6. Sigmoid colon diverticulosis."    Currently in Pain? Yes    Pain Onset More than a month ago              OBJECTIVE FOTO = 33 (01/17/2021);     Therapeutic exercise: to centralize symptoms and improve ROM and strength required for successful completion of functional activities.    Circuit: - hooklying isometric hip abduction against yoga block (longways), 2x15, 5 second holds. - hooklying isometric hip abduction against gait belt, 2x15, 5 second holds. - hooklying wide stance bridge with, ankles dorsiflexed, 5 second hold, 2x15   - hooklying abdominal brace with alternating LE extension, 2x10 each side - supine short arc quad, with 5 second hold, 10# ankle weights, 1x10 each side.  - attempted standing single leg stance, discontinued due to intolerable R hip pain with weight bearing.  - quadruped bird dog legs only, 1x10 each side.   Manual therapy: to reduce pain and tissue tension, improve range of motion,  neuromodulation, in order to promote improved ability to complete functional activities. - hooklying LAD through left hip, 3x30 seconds each side to tolerance    Pt required multimodal cuing for proper technique and to facilitate improved neuromuscular control, strength, range of motion, and functional ability resulting in improved performance and form.   HOME EXERCISE PROGRAM Access Code: [email or web address] URL: [email or web address] Date: 12/15/2020 Prepared by: Rosita Kea   Exercises Hooklying Isometric Hip Abduction with Belt - 1 x daily - 1 sets - 20 reps - 5 second hold hold Supine Hip Adduction Isometric with Ball - 1 x daily - 1 sets - 20 reps - 5 seconds hold Bridge on Heels - 1 x daily - 2 sets - 10 reps - 5 seconds hold      PT Education - 01/19/21 1354     Education Details intervention/excercise technique/purpose. self-management techniques    Person(s) Educated Patient  Methods Explanation;Demonstration;Tactile cues;Verbal cues    Comprehension Verbalized understanding;Returned demonstration;Verbal cues required;Tactile cues required;Need further instruction              PT Short Term Goals - 01/03/21 1526       PT SHORT TERM GOAL #1   Title Be independent with initial home exercise program for self-management of symptoms.    Baseline Initial HEP to be provided at visit 2 (12/13/2020);    Time 3    Period Weeks    Status Achieved    Target Date 01/03/21               PT Long Term Goals - 01/03/21 1526       PT LONG TERM GOAL #1   Title Be independent with a long-term home exercise program for self-management of symptoms.    Baseline Initial HEP to be provided at visit 2 (12/13/2020); currently participating in appropriate HEP (01/03/2021);    Time 12    Period Weeks    Status Partially Met   TARGET DATE FOR ALL LONG TERM GOALS: 03/07/2021     PT LONG TERM GOAL #2   Title Demonstrate improved FOTO score by 10 units to demonstrate improvement  in overall condition and self-reported functional ability.    Baseline to be completed visit 2 as appropriate (12/13/2020); 31 (12/15/2020); 38 (01/03/2021);    Time 12    Period Weeks    Status Partially Met      PT LONG TERM GOAL #3   Title Improve B hip strength to 4+/5 without increase in pain for improved ability to allow patient to complete valued functional tasks such as stairs and walking with less difficulty.    Baseline weak and painful - see objective exam (12/13/2020);    Time 12    Period Weeks    Status On-going      PT LONG TERM GOAL #4   Title Patient will demonstrate single leg stance balance on firm surface with eyes open of equal or greater than 30 seconds each side to demonstrate improved proprioception and balance for functional activities such as walking, stairs, community and household mobility tasks.    Baseline < 5 seconds both sides (12/13/2020); R 14 seconds, L 21 seconds (01/03/2021);    Time 12    Period Weeks    Status Partially Met      PT LONG TERM GOAL #5   Title Complete community, work and/or recreational activities without limitation due to current condition.    Baseline Functional Limitations:difficulty with ADLs, IADLs, activities that require weight bearing, walking, standing, stairs lifting, shaving legs, sleeping, hiking (12/13/2020);    Time 12    Period Weeks    Status On-going                   Plan - 01/19/21 1425     Clinical Impression Statement Patient tolerated treatment well overall and made slight progressions in exercise/loading. Unable to tolerate R SLS but did well with modified bird dog for lateral hip stability. Patient would benefit from continued management of limiting condition by skilled physical therapist to address remaining impairments and functional limitations to work towards stated goals and return to PLOF or maximal functional independence.    Personal Factors and Comorbidities Comorbidity 3+;Time since onset of  injury/illness/exacerbation;Past/Current Experience;Fitness    Comorbidities Relevant past medical history and comorbidities include chronic back pain, chronic neck pain, hypermobile in several joints, GERD, fatigue, depression/anxiety, CAD (smallest artery in  heart - no stent recommended - sees cardiologist no restrictions), skin cancer (cleared up a few years ago), former smoker.    Examination-Activity Limitations Squat;Stairs;Lift;Locomotion Level;Stand;Caring for Others;Carry;Sleep    Examination-Participation Restrictions Laundry;Cleaning;Shop;Community Activity;Occupation;Interpersonal Relationship    Stability/Clinical Decision Making Evolving/Moderate complexity    Rehab Potential Good    PT Frequency 2x / week    PT Duration 12 weeks    PT Treatment/Interventions ADLs/Self Care Home Management;Aquatic Therapy;Biofeedback;Electrical Stimulation;Cryotherapy;Moist Heat;Gait training;Stair training;Functional mobility training;Therapeutic activities;Therapeutic exercise;Balance training;Neuromuscular re-education;Patient/family education;Manual techniques;Passive range of motion;Taping;Dry needling;Spinal Manipulations;Joint Manipulations;Other (comment);Energy conservation    PT Next Visit Plan updated HEP as appropriate, LE strengthening as tolerated, dry needling, pacing    PT Home Exercise Plan Medbridge Access Code: [email or web address]    Consulted and Agree with Plan of Care Patient             Patient will benefit from skilled therapeutic intervention in order to improve the following deficits and impairments:  Improper body mechanics, Pain, Decreased mobility, Hypermobility, Increased muscle spasms, Postural dysfunction, Decreased activity tolerance, Decreased endurance, Decreased strength, Impaired perceived functional ability, Difficulty walking, Abnormal gait, Decreased balance  Visit Diagnosis: Pain in right hip  Pain in left hip  Chronic bilateral low back pain, unspecified whether  sciatica present  Difficulty in walking, not elsewhere classified  Muscle weakness (generalized)  Other muscle spasm     Problem List Patient Active Problem List   Diagnosis Date Noted   DDD (degenerative disc disease), lumbar 01/06/2021   Facet arthritis of lumbar region 01/06/2021   Trochanteric bursitis of both hips 11/30/2020   Special screening for malignant neoplasms, colon    Polyp of cecum    Pain in both feet 04/12/2020   Burning sensation of feet 04/12/2020   Pain of both hip joints 04/12/2020   Onychomycosis 04/12/2020   Fibrocystic breast disease (FCBD) 04/12/2020   Numbness in both legs 09/09/2019   Spinal stenosis in cervical region 05/09/2019   Facet arthropathy, cervical 05/09/2019   Chronic pain 11/27/2018   Obesity (BMI 30.0-34.9) 11/27/2018   Sprain and strain of hip and thigh 05/10/2018   Abnormal MRI, cervical spine 03/28/2018   Menopause 11/26/2017   Right upper quadrant pain 11/26/2017   Abnormal Pap smear of cervix 07/27/2017   Vasomotor flushing 07/19/2017   Depression 07/18/2017   Nausea 06/25/2017   Chronic pain of both hips 06/25/2017   OSA (obstructive sleep apnea) 04/18/2017   Annual physical exam 04/10/2017   Fatigue 12/16/2016   Chronic back pain 05/30/2016   Abdominal pain 03/12/2016   Anxiety and depression 12/28/2015   History of obstructive sleep apnea 12/15/2015   GERD (gastroesophageal reflux disease) 12/15/2015   Hyperlipidemia 12/15/2015   Pain medication agreement signed 03/25/2015   CAD in native artery 02/05/2015   Right supraspinatus tenosynovitis 08/17/2014   Crohn's disease involving terminal ileum ([place]) 03/30/2014   FH: colon polyps 03/30/2014   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 Graylon Good, PT, DPT 01/19/21, 2:37 PM   [facility] [address] [place], [address] Phone: [phone]   Fax:  [phone]  Name: Lucyle Alumbaugh MRN: [id number] Date of Birth: Aug 26, 1965

## 2021-01-24 ENCOUNTER — Other Ambulatory Visit: Payer: Self-pay

## 2021-01-24 ENCOUNTER — Encounter: Payer: Self-pay | Admitting: Physical Therapy

## 2021-01-24 ENCOUNTER — Ambulatory Visit: Payer: Managed Care, Other (non HMO) | Admitting: Physical Therapy

## 2021-01-24 DIAGNOSIS — R262 Difficulty in walking, not elsewhere classified: Secondary | ICD-10-CM

## 2021-01-24 DIAGNOSIS — M25552 Pain in left hip: Secondary | ICD-10-CM

## 2021-01-24 DIAGNOSIS — M6281 Muscle weakness (generalized): Secondary | ICD-10-CM

## 2021-01-24 DIAGNOSIS — M62838 Other muscle spasm: Secondary | ICD-10-CM

## 2021-01-24 DIAGNOSIS — M25551 Pain in right hip: Secondary | ICD-10-CM

## 2021-01-24 DIAGNOSIS — M545 Low back pain, unspecified: Secondary | ICD-10-CM

## 2021-01-24 DIAGNOSIS — G8929 Other chronic pain: Secondary | ICD-10-CM

## 2021-01-24 NOTE — Therapy (Signed)
[facility] [address] [place], [address] Phone: [phone]   Fax:  [phone]  Physical Therapy Treatment / Progress Note Dates of reporting: 12/13/2020 - 01/24/2021  Patient Details  Name: Carolyn Esparza MRN: [id number] Date of Birth: Jun 29, 1966 Referring Provider (PT): [name], [name], MD   Encounter Date: 01/24/2021   PT End of Session - 01/24/21 1350     Visit Number 10    Number of Visits 24    Date for PT Re-Evaluation 03/07/21    Authorization Type [employer] reporting period from 12/13/2020    Authorization Time Period 60 visits Per calendar year. Auth is not requried/ref number [id number]    Authorization - Visit Number 85    Authorization - Number of Visits 60    Progress Note Due on Visit 10    PT Start Time 7915    PT Stop Time 1428    PT Time Calculation (min) 40 min    Activity Tolerance Patient tolerated treatment well;Patient limited by pain    Behavior During Therapy WFL for tasks assessed/performed             Past Medical History:  Diagnosis Date   Anxiety    ASCUS with positive high risk HPV cervical    CAD (coronary artery disease)    Chicken pox    Chronic pain    neck, back, b/l hips    Complication of anesthesia    DDD (degenerative disc disease), lumbar 01/06/2021   Depression    Facet arthritis of lumbar region 01/06/2021   GERD (gastroesophageal reflux disease)    History of Crohn's disease    Hyperlipidemia    Left ovarian cyst    s/p removal of 1 ovary ? which one removed per pt    Libido, decreased    Skin cancer    reports skin cancer removed from skin in 2016 or 2017   Thyroid nodule    repeat US resolved and Ct neck 05/2019 normal thyroid     Past Surgical History:  Procedure Laterality Date   ABDOMINAL HYSTERECTOMY     APPENDECTOMY  2004   BLADDER SURGERY     BREAST BIOPSY     x 2   COLONOSCOPY     COLONOSCOPY WITH PROPOFOL N/A 10/25/2020    Procedure: COLONOSCOPY WITH PROPOFOL;  Surgeon: Mauri Pole, MD;  Location: [facility];  Service: Endoscopy;  Laterality: N/A;   HEMOSTASIS CLIP PLACEMENT  10/25/2020   Procedure: HEMOSTASIS CLIP PLACEMENT;  Surgeon: Mauri Pole, MD;  Location: [facility];  Service: Endoscopy;;   OOPHORECTOMY Right    Unilateral Right (per MRI 04/17/2018)   POLYPECTOMY  10/25/2020   Procedure: POLYPECTOMY;  Surgeon: Mauri Pole, MD;  Location: [facility];  Service: Endoscopy;;   SUBMUCOSAL LIFTING INJECTION  10/25/2020   Procedure: SUBMUCOSAL LIFTING INJECTION;  Surgeon: Mauri Pole, MD;  Location: [facility];  Service: Endoscopy;;   WISDOM TOOTH EXTRACTION      There were no vitals filed for this visit.   Subjective Assessment - 01/24/21 1348     Subjective Patinet reports she is doing well today. Felt okay after last PT session and did her HEP 2x15 reps of each exercise 3/4 days since last PT session with no issues. Reports R hip pain at 5/10 upon arrival. Thinks her limp is almost gone.    Pertinent History Patient is a 55 y.o. female who presents to outpatient physical therapy  with a referral for medical diagnosis chronic pain of both hips, pain of both hip joints, low back pain, unspecified back pain laterality, unspecified chronicity, unspecified whether sciatica present,  trochanteric bursitis of both hips. This patient's chief complaints consist of chronic bilateral hip pain R > L in setting of chronic low back pain leading to the following functional deficits: difficulty with ADLs, IADLs, activities that require weight bearing, walking, standing, stairs lifting, shaving legs, sleeping and decreased quality of life.   Relevant past medical history and comorbidities include chronic back pain, chronic neck pain, hypermobile in several joints, GERD, fatigue, depression/anxiety, CAD (smallest artery in heart - no stent recommended - sees cardiologist no restrictions), skin  cancer (cleared up a few years ago).  Patient denies hx of stroke, seizures, lung problem, major cardiac events, diabetes, unexplained weight loss, changes in bowel or bladder problems, stumbling or dropping things, spinal surgeries, osteoporosis.    Limitations Standing;Walking;House hold activities   difficulty with ADLs, IADLs, activities that require weight bearing, walking, standing, stairs lifting, shaving legs, sleeping and decreased quality of life.   Diagnostic tests R&L hip MRI report 04/18/2018: "IMPRESSION:  1. On the right side, there is a partially torn distal gluteus  medius tendon with considerable tendinopathy in the gluteus medius  and mild gluteus minimus tendinopathy, as well as adjacent  trochanteric bursitis.  2. There is also a small right anterior superior labral tear which  appears nondisplaced.  3. On the left side there is distal gluteus medius moderate  tendinopathy and also mild proximal hamstring tendinopathy along  with mild trochanteric bursitis.  4. Spondylosis and degenerative disc disease at L5-S1.  5. Multiple chronic small cystic lesions along the vaginal cuff.  6. Sigmoid colon diverticulosis."    Currently in Pain? Yes    Pain Score 5     Pain Onset More than a month ago               OBJECTIVE FOTO = 38 (01/24/2021);   MANUAL MUSCLE TEST: *Indicates pain Hip        Flexion: R = 4+/5 painful lat hip, L = 4/5. Extension: R = 4+/5, L = 4/5. Abduction: R = 3/5 painful lat hip, L = 4+/5. Adduction: Flex: R = 3+/5 painful lat hip, L = 2.  FUNCTIONAL/BALANCE TESTS: Single leg stance, firm surface, eyes open: R =15 seconds (limited by lateral R hip pain) , L= 38 seconds (limited by L lateral thigh pain). Noted for increased trendelenburg sign when standing on R LE (educated patient).    TREATMENT:     Therapeutic exercise: to centralize symptoms and improve ROM and strength required for successful completion of functional activities.  - testing to assess  progress (see above).   - hooklying bridge with isometric hip abduction against yoga block (longways), 1x15, 5 second holds. - hooklying bridge with isometric hip abduction against gait belt, 1x15, 5 second holds. - hooklying isometric hip abduction against yoga block (longways), 1x15, 5 second holds. - hooklying isometric hip abduction against gait belt, 1x15, 5 second holds. - quadruped bird dog legs only, 1x10 each side.  - Education on HEP including handout   Manual therapy: to reduce pain and tissue tension, improve range of motion, neuromodulation, in order to promote improved ability to complete functional activities. - hooklying LAD through left hip, 3x30 seconds each side to tolerance    Pt required multimodal cuing for proper technique and to facilitate improved neuromuscular control, strength, range of motion, and  functional ability resulting in improved performance and form.   HOME EXERCISE PROGRAM Access Code: [email or web address] URL: [email or web address] Date: 12/15/2020 Prepared by: Rosita Kea   Exercises Hooklying Isometric Hip Abduction with Belt - 1 x daily - 1 sets - 20 reps - 5 second hold hold Supine Hip Adduction Isometric with Ball - 1 x daily - 1 sets - 20 reps - 5 seconds hold Bridge on Heels - 1 x   PT Education - 01/24/21 1353     Education Details intervention/excercise technique/purpose. self-management techniques    Person(s) Educated Patient    Methods Explanation;Demonstration;Tactile cues;Verbal cues    Comprehension Verbalized understanding;Returned demonstration;Verbal cues required;Tactile cues required;Need further instruction              PT Short Term Goals - 01/03/21 1526       PT SHORT TERM GOAL #1   Title Be independent with initial home exercise program for self-management of symptoms.    Baseline Initial HEP to be provided at visit 2 (12/13/2020);    Time 3    Period Weeks    Status Achieved    Target Date 01/03/21                PT Long Term Goals - 01/24/21 2105       PT LONG TERM GOAL #1   Title Be independent with a long-term home exercise program for self-management of symptoms.    Baseline Initial HEP to be provided at visit 2 (12/13/2020); currently participating in appropriate HEP (01/03/2021; 01/24/2021);    Time 12    Period Weeks    Status Partially Met   TARGET DATE FOR ALL LONG TERM GOALS: 03/07/2021     PT LONG TERM GOAL #2   Title Demonstrate improved FOTO score by 10 units to demonstrate improvement in overall condition and self-reported functional ability.    Baseline to be completed visit 2 as appropriate (12/13/2020); 31 (12/15/2020); 38 (01/03/2021); 38 (01/24/2021);    Time 12    Period Weeks    Status Partially Met      PT LONG TERM GOAL #3   Title Improve B hip strength to 4+/5 without increase in pain for improved ability to allow patient to complete valued functional tasks such as stairs and walking with less difficulty.    Baseline weak and painful - see objective exam (12/13/2020); weak and painful but with improvements - see objective exam (01/24/2021);    Time 12    Period Weeks    Status Partially Met      PT LONG TERM GOAL #4   Title Patient will demonstrate single leg stance balance on firm surface with eyes open of equal or greater than 30 seconds each side to demonstrate improved proprioception and balance for functional activities such as walking, stairs, community and household mobility tasks.    Baseline < 5 seconds both sides (12/13/2020); R 14 seconds, L 21 seconds (01/03/2021); R =15 seconds, L= 38 seconds (01/24/2021);    Time 12    Period Weeks    Status Partially Met      PT LONG TERM GOAL #5   Title Complete community, work and/or recreational activities without limitation due to current condition.    Baseline Functional Limitations:difficulty with ADLs, IADLs, activities that require weight bearing, walking, standing, stairs lifting, shaving legs, sleeping, hiking  (12/13/2020); notes improvements but still quite limited (01/24/2021);    Time 12    Period Weeks    Status On-going  Plan - 01/24/21 2108     Clinical Impression Statement Patient has attended 10 physical therapy sessions this episode of care and is making gradual progress towards goals. Condition has been very sensitive and pain dominant and patient initially had difficulty tolerating progressions. Returned to tolerated exercises and have been progressing exercises more slowly, especially loading of lateral hip. Overall, patient has significant limitations in activity tolerance and fatigues quickly but in the last 2 weeks has been tolerating PT well with gentle progressions. Demonstrated significant improvement in SLS time today, and some improvements in hip strength/pain although she is still significantly limited. Patient would benefit from continued management of limiting condition by skilled physical therapist to address remaining impairments and functional limitations to work towards stated goals and return to PLOF or maximal functional independence.    Personal Factors and Comorbidities Comorbidity 3+;Time since onset of injury/illness/exacerbation;Past/Current Experience;Fitness    Comorbidities Relevant past medical history and comorbidities include chronic back pain, chronic neck pain, hypermobile in several joints, GERD, fatigue, depression/anxiety, CAD (smallest artery in heart - no stent recommended - sees cardiologist no restrictions), skin cancer (cleared up a few years ago), former smoker.    Examination-Activity Limitations Squat;Stairs;Lift;Locomotion Level;Stand;Caring for Others;Carry;Sleep    Examination-Participation Restrictions Laundry;Cleaning;Shop;Community Activity;Occupation;Interpersonal Relationship    Stability/Clinical Decision Making Evolving/Moderate complexity    Rehab Potential Good    PT Frequency 2x / week    PT Duration 12 weeks    PT  Treatment/Interventions ADLs/Self Care Home Management;Aquatic Therapy;Biofeedback;Electrical Stimulation;Cryotherapy;Moist Heat;Gait training;Stair training;Functional mobility training;Therapeutic activities;Therapeutic exercise;Balance training;Neuromuscular re-education;Patient/family education;Manual techniques;Passive range of motion;Taping;Dry needling;Spinal Manipulations;Joint Manipulations;Other (comment);Energy conservation    PT Next Visit Plan updated HEP as appropriate, LE strengthening as tolerated, pacing, glute med loading    PT Home Exercise Plan Medbridge Access Code: [email or web address]    Consulted and Agree with Plan of Care Patient             Patient will benefit from skilled therapeutic intervention in order to improve the following deficits and impairments:  Improper body mechanics, Pain, Decreased mobility, Hypermobility, Increased muscle spasms, Postural dysfunction, Decreased activity tolerance, Decreased endurance, Decreased strength, Impaired perceived functional ability, Difficulty walking, Abnormal gait, Decreased balance  Visit Diagnosis: Pain in right hip  Pain in left hip  Chronic bilateral low back pain, unspecified whether sciatica present  Difficulty in walking, not elsewhere classified  Muscle weakness (generalized)  Other muscle spasm     Problem List Patient Active Problem List   Diagnosis Date Noted   DDD (degenerative disc disease), lumbar 01/06/2021   Facet arthritis of lumbar region 01/06/2021   Trochanteric bursitis of both hips 11/30/2020   Special screening for malignant neoplasms, colon    Polyp of cecum    Pain in both feet 04/12/2020   Burning sensation of feet 04/12/2020   Pain of both hip joints 04/12/2020   Onychomycosis 04/12/2020   Fibrocystic breast disease (FCBD) 04/12/2020   Numbness in both legs 09/09/2019   Spinal stenosis in cervical region 05/09/2019   Facet arthropathy, cervical 05/09/2019   Chronic pain 11/27/2018    Obesity (BMI 30.0-34.9) 11/27/2018   Sprain and strain of hip and thigh 05/10/2018   Abnormal MRI, cervical spine 03/28/2018   Menopause 11/26/2017   Right upper quadrant pain 11/26/2017   Abnormal Pap smear of cervix 07/27/2017   Vasomotor flushing 07/19/2017   Depression 07/18/2017   Nausea 06/25/2017   Chronic pain of both hips 06/25/2017   OSA (obstructive sleep apnea) 04/18/2017   Annual physical  exam 04/10/2017   Fatigue 12/16/2016   Chronic back pain 05/30/2016   Abdominal pain 03/12/2016   Anxiety and depression 12/28/2015   History of obstructive sleep apnea 12/15/2015   GERD (gastroesophageal reflux disease) 12/15/2015   Hyperlipidemia 12/15/2015   Pain medication agreement signed 03/25/2015   CAD in native artery 02/05/2015   Right supraspinatus tenosynovitis 08/17/2014   Crohn's disease involving terminal ileum ([place]) 03/30/2014   FH: colon polyps 03/30/2014   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 Graylon Good, PT, DPT 01/24/21, 9:09 PM   [facility] [address] [place], [address] Phone: [phone]   Fax:  [phone]  Name: Brittany Osier MRN: [id number] Date of Birth: 1965-11-05

## 2021-01-26 ENCOUNTER — Encounter: Payer: Managed Care, Other (non HMO) | Admitting: Physical Therapy

## 2021-02-02 ENCOUNTER — Encounter: Payer: Managed Care, Other (non HMO) | Admitting: Physical Therapy

## 2021-02-07 ENCOUNTER — Ambulatory Visit: Payer: Managed Care, Other (non HMO) | Admitting: Physical Therapy

## 2021-02-09 ENCOUNTER — Ambulatory Visit: Payer: Managed Care, Other (non HMO) | Attending: Internal Medicine | Admitting: Physical Therapy

## 2021-02-09 DIAGNOSIS — M62838 Other muscle spasm: Secondary | ICD-10-CM | POA: Diagnosis present

## 2021-02-09 DIAGNOSIS — G8929 Other chronic pain: Secondary | ICD-10-CM | POA: Insufficient documentation

## 2021-02-09 DIAGNOSIS — M25552 Pain in left hip: Secondary | ICD-10-CM | POA: Diagnosis present

## 2021-02-09 DIAGNOSIS — R262 Difficulty in walking, not elsewhere classified: Secondary | ICD-10-CM | POA: Insufficient documentation

## 2021-02-09 DIAGNOSIS — M545 Low back pain, unspecified: Secondary | ICD-10-CM | POA: Diagnosis present

## 2021-02-09 DIAGNOSIS — M6281 Muscle weakness (generalized): Secondary | ICD-10-CM

## 2021-02-09 DIAGNOSIS — M25551 Pain in right hip: Secondary | ICD-10-CM | POA: Insufficient documentation

## 2021-02-09 NOTE — Therapy (Signed)
[facility] [address], [place], [address] Phone: [phone]   Fax:  [phone]  Physical Therapy Treatment  Patient Details  Name: Carolyn Esparza MRN: [id number] Date of Birth: 07/17/1966 Referring Provider (PT): [name], [name], MD   Encounter Date: 02/09/2021   PT End of Session - 02/09/21 1533     Visit Number 11    Number of Visits 24    Date for PT Re-Evaluation 03/07/21    Authorization Type [employer] reporting period from 01/24/21    Authorization Time Period 60 visits Per calendar year. Auth is not requried/ref number [id number]    Authorization - Visit Number 79    Authorization - Number of Visits 60    Progress Note Due on Visit 10    PT Start Time 1520    PT Stop Time 1600    PT Time Calculation (min) 40 min    Activity Tolerance Patient tolerated treatment well;Patient limited by fatigue    Behavior During Therapy WFL for tasks assessed/performed             Past Medical History:  Diagnosis Date   Anxiety    ASCUS with positive high risk HPV cervical    CAD (coronary artery disease)    Chicken pox    Chronic pain    neck, back, b/l hips    Complication of anesthesia    DDD (degenerative disc disease), lumbar 01/06/2021   Depression    Facet arthritis of lumbar region 01/06/2021   GERD (gastroesophageal reflux disease)    History of Crohn's disease    Hyperlipidemia    Left ovarian cyst    s/p removal of 1 ovary ? which one removed per pt    Libido, decreased    Skin cancer    reports skin cancer removed from skin in 2016 or 2017   Thyroid nodule    repeat US resolved and Ct neck 05/2019 normal thyroid     Past Surgical History:  Procedure Laterality Date   ABDOMINAL HYSTERECTOMY     APPENDECTOMY  2004   BLADDER SURGERY     BREAST BIOPSY     x 2   COLONOSCOPY     COLONOSCOPY WITH PROPOFOL N/A 10/25/2020   Procedure: COLONOSCOPY WITH PROPOFOL;  Surgeon: Mauri Pole, MD;  Location: [facility];  Service: Endoscopy;  Laterality: N/A;   HEMOSTASIS CLIP PLACEMENT  10/25/2020   Procedure: HEMOSTASIS CLIP PLACEMENT;  Surgeon: Mauri Pole, MD;  Location: [facility];  Service: Endoscopy;;   OOPHORECTOMY Right    Unilateral Right (per MRI 04/17/2018)   POLYPECTOMY  10/25/2020   Procedure: POLYPECTOMY;  Surgeon: Mauri Pole, MD;  Location: [facility];  Service: Endoscopy;;   SUBMUCOSAL LIFTING INJECTION  10/25/2020   Procedure: SUBMUCOSAL LIFTING INJECTION;  Surgeon: Mauri Pole, MD;  Location: [facility];  Service: Endoscopy;;   WISDOM TOOTH EXTRACTION      There were no vitals filed for this visit.   Subjective Assessment - 02/09/21 1522     Subjective Patient reports she is doing okay today. She has has been completing her HEP with the less advanced exercises every other day or 2 days, 2-3 sets per session. States she is not limping much lately. Has about 4/10 pain right lateral hip. Thinks she felt okay after last PT session. Walking the dogs really seems to irriate her symptoms.    Pertinent History Patient is a 55 y.o. female  who presents to outpatient physical therapy with a referral for medical diagnosis chronic pain of both hips, pain of both hip joints, low back pain, unspecified back pain laterality, unspecified chronicity, unspecified whether sciatica present,  trochanteric bursitis of both hips. This patient's chief complaints consist of chronic bilateral hip pain R > L in setting of chronic low back pain leading to the following functional deficits: difficulty with ADLs, IADLs, activities that require weight bearing, walking, standing, stairs lifting, shaving legs, sleeping and decreased quality of life.   Relevant past medical history and comorbidities include chronic back pain, chronic neck pain, hypermobile in several joints, GERD, fatigue, depression/anxiety, CAD (smallest artery in heart - no stent recommended - sees  cardiologist no restrictions), skin cancer (cleared up a few years ago).  Patient denies hx of stroke, seizures, lung problem, major cardiac events, diabetes, unexplained weight loss, changes in bowel or bladder problems, stumbling or dropping things, spinal surgeries, osteoporosis.    Limitations Standing;Walking;House hold activities   difficulty with ADLs, IADLs, activities that require weight bearing, walking, standing, stairs lifting, shaving legs, sleeping and decreased quality of life.   Diagnostic tests R&L hip MRI report 04/18/2018: "IMPRESSION:  1. On the right side, there is a partially torn distal gluteus  medius tendon with considerable tendinopathy in the gluteus medius  and mild gluteus minimus tendinopathy, as well as adjacent  trochanteric bursitis.  2. There is also a small right anterior superior labral tear which  appears nondisplaced.  3. On the left side there is distal gluteus medius moderate  tendinopathy and also mild proximal hamstring tendinopathy along  with mild trochanteric bursitis.  4. Spondylosis and degenerative disc disease at L5-S1.  5. Multiple chronic small cystic lesions along the vaginal cuff.  6. Sigmoid colon diverticulosis."    Currently in Pain? Yes    Pain Score 4     Pain Onset More than a month ago            TREATMENT:     Manual therapy: to reduce pain and tissue tension, improve range of motion, neuromodulation, in order to promote improved ability to complete functional activities. - hooklying LAD through hip, 3x30 seconds each side to tolerance  Therapeutic exercise: to centralize symptoms and improve ROM and strength required for successful completion of functional activities.   - hooklying bridge with isometric hip abduction against yoga block (longways), 1x15, 5 second holds. - hooklying bridge with isometric hip abduction against gait belt, 1x15, 5 second holds. - hooklying isometric hip abduction against yoga block (longways), 1x15, 5  second holds. - hooklying isometric hip abduction against gait belt, 1x15, 5 second holds. - quadruped bird dog legs only, 2x10 each side.  - supine short arc quad, with 5 second hold, 5# ankle weights, 1x10 each side.  - standing heel raises DLS with B UE support, 1x15 - review of HEP    Pt required multimodal cuing for proper technique and to facilitate improved neuromuscular control, strength, range of motion, and functional ability resulting in improved performance and form.   HOME EXERCISE PROGRAM Access Code: [email or web address] URL: [email or web address] Date: 12/15/2020 Prepared by: Rosita Kea   Exercises Hooklying Isometric Hip Abduction with Belt - 1 x daily - 1 sets - 20 reps - 5 second hold hold Supine Hip Adduction Isometric with Ball - 1 x daily - 1 sets - 20 reps - 5 seconds hold Bridge on Heels - 1 x   PT Education - 02/09/21 1533  Education Details intervention/excercise technique/purpose. self-management techniques    Person(s) Educated Patient    Methods Explanation;Demonstration;Tactile cues;Verbal cues    Comprehension Verbalized understanding;Returned demonstration;Verbal cues required;Tactile cues required;Need further instruction              PT Short Term Goals - 01/03/21 1526       PT SHORT TERM GOAL #1   Title Be independent with initial home exercise program for self-management of symptoms.    Baseline Initial HEP to be provided at visit 2 (12/13/2020);    Time 3    Period Weeks    Status Achieved    Target Date 01/03/21               PT Long Term Goals - 01/24/21 2105       PT LONG TERM GOAL #1   Title Be independent with a long-term home exercise program for self-management of symptoms.    Baseline Initial HEP to be provided at visit 2 (12/13/2020); currently participating in appropriate HEP (01/03/2021; 01/24/2021);    Time 12    Period Weeks    Status Partially Met   TARGET DATE FOR ALL LONG TERM GOALS: 03/07/2021     PT  LONG TERM GOAL #2   Title Demonstrate improved FOTO score by 10 units to demonstrate improvement in overall condition and self-reported functional ability.    Baseline to be completed visit 2 as appropriate (12/13/2020); 31 (12/15/2020); 38 (01/03/2021); 38 (01/24/2021);    Time 12    Period Weeks    Status Partially Met      PT LONG TERM GOAL #3   Title Improve B hip strength to 4+/5 without increase in pain for improved ability to allow patient to complete valued functional tasks such as stairs and walking with less difficulty.    Baseline weak and painful - see objective exam (12/13/2020); weak and painful but with improvements - see objective exam (01/24/2021);    Time 12    Period Weeks    Status Partially Met      PT LONG TERM GOAL #4   Title Patient will demonstrate single leg stance balance on firm surface with eyes open of equal or greater than 30 seconds each side to demonstrate improved proprioception and balance for functional activities such as walking, stairs, community and household mobility tasks.    Baseline < 5 seconds both sides (12/13/2020); R 14 seconds, L 21 seconds (01/03/2021); R =15 seconds, L= 38 seconds (01/24/2021);    Time 12    Period Weeks    Status Partially Met      PT LONG TERM GOAL #5   Title Complete community, work and/or recreational activities without limitation due to current condition.    Baseline Functional Limitations:difficulty with ADLs, IADLs, activities that require weight bearing, walking, standing, stairs lifting, shaving legs, sleeping, hiking (12/13/2020); notes improvements but still quite limited (01/24/2021);    Time 12    Period Weeks    Status On-going                   Plan - 02/09/21 1545     Clinical Impression Statement Pateint tolerated treatment well overall and was able to continue with slightly progressed HEP that was performed at her last session about 2 weeks ago but that she has not yet transitioned to at home. Patient  would benefit from continued management of limiting condition by skilled physical therapist to address remaining impairments and functional limitations to work towards  stated goals and return to PLOF or maximal functional independence.    Personal Factors and Comorbidities Comorbidity 3+;Time since onset of injury/illness/exacerbation;Past/Current Experience;Fitness    Comorbidities Relevant past medical history and comorbidities include chronic back pain, chronic neck pain, hypermobile in several joints, GERD, fatigue, depression/anxiety, CAD (smallest artery in heart - no stent recommended - sees cardiologist no restrictions), skin cancer (cleared up a few years ago), former smoker.    Examination-Activity Limitations Squat;Stairs;Lift;Locomotion Level;Stand;Caring for Others;Carry;Sleep    Examination-Participation Restrictions Laundry;Cleaning;Shop;Community Activity;Occupation;Interpersonal Relationship    Stability/Clinical Decision Making Evolving/Moderate complexity    Rehab Potential Good    PT Frequency 2x / week    PT Duration 12 weeks    PT Treatment/Interventions ADLs/Self Care Home Management;Aquatic Therapy;Biofeedback;Electrical Stimulation;Cryotherapy;Moist Heat;Gait training;Stair training;Functional mobility training;Therapeutic activities;Therapeutic exercise;Balance training;Neuromuscular re-education;Patient/family education;Manual techniques;Passive range of motion;Taping;Dry needling;Spinal Manipulations;Joint Manipulations;Other (comment);Energy conservation    PT Next Visit Plan updated HEP as appropriate, LE strengthening as tolerated, pacing, glute med loading    PT Home Exercise Plan Medbridge Access Code: [email or web address]    Consulted and Agree with Plan of Care Patient             Patient will benefit from skilled therapeutic intervention in order to improve the following deficits and impairments:  Improper body mechanics, Pain, Decreased mobility, Hypermobility, Increased  muscle spasms, Postural dysfunction, Decreased activity tolerance, Decreased endurance, Decreased strength, Impaired perceived functional ability, Difficulty walking, Abnormal gait, Decreased balance  Visit Diagnosis: Pain in right hip  Pain in left hip  Chronic bilateral low back pain, unspecified whether sciatica present  Difficulty in walking, not elsewhere classified  Muscle weakness (generalized)     Problem List Patient Active Problem List   Diagnosis Date Noted   DDD (degenerative disc disease), lumbar 01/06/2021   Facet arthritis of lumbar region 01/06/2021   Trochanteric bursitis of both hips 11/30/2020   Special screening for malignant neoplasms, colon    Polyp of cecum    Pain in both feet 04/12/2020   Burning sensation of feet 04/12/2020   Pain of both hip joints 04/12/2020   Onychomycosis 04/12/2020   Fibrocystic breast disease (FCBD) 04/12/2020   Numbness in both legs 09/09/2019   Spinal stenosis in cervical region 05/09/2019   Facet arthropathy, cervical 05/09/2019   Chronic pain 11/27/2018   Obesity (BMI 30.0-34.9) 11/27/2018   Sprain and strain of hip and thigh 05/10/2018   Abnormal MRI, cervical spine 03/28/2018   Menopause 11/26/2017   Right upper quadrant pain 11/26/2017   Abnormal Pap smear of cervix 07/27/2017   Vasomotor flushing 07/19/2017   Depression 07/18/2017   Nausea 06/25/2017   Chronic pain of both hips 06/25/2017   OSA (obstructive sleep apnea) 04/18/2017   Annual physical exam 04/10/2017   Fatigue 12/16/2016   Chronic back pain 05/30/2016   Abdominal pain 03/12/2016   Anxiety and depression 12/28/2015   History of obstructive sleep apnea 12/15/2015   GERD (gastroesophageal reflux disease) 12/15/2015   Hyperlipidemia 12/15/2015   Pain medication agreement signed 03/25/2015   CAD in native artery 02/05/2015   Right supraspinatus tenosynovitis 08/17/2014   Crohn's disease involving terminal ileum ([place]) 03/30/2014   FH: colon polyps  03/30/2014   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 Graylon Good, PT, DPT 02/09/21, 4:03 PM  [facility] [address]  [place], [place], [address] Phone: [phone]   Fax:  [phone]  Name: Carolyn Esparza MRN: [id number] Date of Birth: 07-17-1966

## 2021-02-10 ENCOUNTER — Encounter: Payer: Managed Care, Other (non HMO) | Admitting: Physical Therapy

## 2021-02-15 ENCOUNTER — Encounter: Payer: Self-pay | Admitting: Physical Therapy

## 2021-02-15 ENCOUNTER — Ambulatory Visit: Payer: Managed Care, Other (non HMO) | Admitting: Physical Therapy

## 2021-02-15 DIAGNOSIS — R262 Difficulty in walking, not elsewhere classified: Secondary | ICD-10-CM

## 2021-02-15 DIAGNOSIS — M25551 Pain in right hip: Secondary | ICD-10-CM | POA: Diagnosis not present

## 2021-02-15 DIAGNOSIS — M545 Low back pain, unspecified: Secondary | ICD-10-CM

## 2021-02-15 DIAGNOSIS — M6281 Muscle weakness (generalized): Secondary | ICD-10-CM

## 2021-02-15 DIAGNOSIS — G8929 Other chronic pain: Secondary | ICD-10-CM

## 2021-02-15 DIAGNOSIS — M25552 Pain in left hip: Secondary | ICD-10-CM

## 2021-02-15 DIAGNOSIS — M62838 Other muscle spasm: Secondary | ICD-10-CM

## 2021-02-15 NOTE — Therapy (Signed)
[facility] [address], [place], [address] Phone: [phone]   Fax:  [phone]  Physical Therapy Treatment  Patient Details  Name: Carolyn Esparza MRN: [id number] Date of Birth: September 08, 1965 Referring Provider (PT): [name], [name], MD   Encounter Date: 02/15/2021   PT End of Session - 02/15/21 1443     Visit Number 12    Number of Visits 24    Date for PT Re-Evaluation 03/07/21    Authorization Type [employer] reporting period from 01/24/21    Authorization Time Period 60 visits Per calendar year. Auth is not requried/ref number [id number]    Authorization - Visit Number 79    Authorization - Number of Visits 60    Progress Note Due on Visit 10    PT Start Time 3299    PT Stop Time 1515    PT Time Calculation (min) 40 min    Activity Tolerance Patient tolerated treatment well;Patient limited by fatigue    Behavior During Therapy WFL for tasks assessed/performed             Past Medical History:  Diagnosis Date   Anxiety    ASCUS with positive high risk HPV cervical    CAD (coronary artery disease)    Chicken pox    Chronic pain    neck, back, b/l hips    Complication of anesthesia    DDD (degenerative disc disease), lumbar 01/06/2021   Depression    Facet arthritis of lumbar region 01/06/2021   GERD (gastroesophageal reflux disease)    History of Crohn's disease    Hyperlipidemia    Left ovarian cyst    s/p removal of 1 ovary ? which one removed per pt    Libido, decreased    Skin cancer    reports skin cancer removed from skin in 2016 or 2017   Thyroid nodule    repeat US resolved and Ct neck 05/2019 normal thyroid     Past Surgical History:  Procedure Laterality Date   ABDOMINAL HYSTERECTOMY     APPENDECTOMY  2004   BLADDER SURGERY     BREAST BIOPSY     x 2   COLONOSCOPY     COLONOSCOPY WITH PROPOFOL N/A 10/25/2020   Procedure: COLONOSCOPY WITH PROPOFOL;  Surgeon: Mauri Pole, MD;  Location: [facility];  Service: Endoscopy;  Laterality: N/A;   HEMOSTASIS CLIP PLACEMENT  10/25/2020   Procedure: HEMOSTASIS CLIP PLACEMENT;  Surgeon: Mauri Pole, MD;  Location: [facility];  Service: Endoscopy;;   OOPHORECTOMY Right    Unilateral Right (per MRI 04/17/2018)   POLYPECTOMY  10/25/2020   Procedure: POLYPECTOMY;  Surgeon: Mauri Pole, MD;  Location: [facility];  Service: Endoscopy;;   SUBMUCOSAL LIFTING INJECTION  10/25/2020   Procedure: SUBMUCOSAL LIFTING INJECTION;  Surgeon: Mauri Pole, MD;  Location: [facility];  Service: Endoscopy;;   WISDOM TOOTH EXTRACTION      There were no vitals filed for this visit.   Subjective Assessment - 02/15/21 1439     Subjective Pateint reports she is feeling pretty good today. States she has 3-4/10 pain in the right hip just posterior to the greater trochanter. Did a lot of extracurricular activities at the beach since last session and did her new exercises just once. Did not awake with as much pain today and is not sure why. Also did a couple of laps in the pool and was in the hot tub. Had  some exhaustion one day after a lot of walking. Mildly sore all over after last PT session. Started taking collagen supplement.    Pertinent History Patient is a 55 y.o. female who presents to outpatient physical therapy with a referral for medical diagnosis chronic pain of both hips, pain of both hip joints, low back pain, unspecified back pain laterality, unspecified chronicity, unspecified whether sciatica present,  trochanteric bursitis of both hips. This patient's chief complaints consist of chronic bilateral hip pain R > L in setting of chronic low back pain leading to the following functional deficits: difficulty with ADLs, IADLs, activities that require weight bearing, walking, standing, stairs lifting, shaving legs, sleeping and decreased quality of life.   Relevant past medical history and comorbidities include chronic  back pain, chronic neck pain, hypermobile in several joints, GERD, fatigue, depression/anxiety, CAD (smallest artery in heart - no stent recommended - sees cardiologist no restrictions), skin cancer (cleared up a few years ago).  Patient denies hx of stroke, seizures, lung problem, major cardiac events, diabetes, unexplained weight loss, changes in bowel or bladder problems, stumbling or dropping things, spinal surgeries, osteoporosis.    Limitations Standing;Walking;House hold activities   difficulty with ADLs, IADLs, activities that require weight bearing, walking, standing, stairs lifting, shaving legs, sleeping and decreased quality of life.   Diagnostic tests R&L hip MRI report 04/18/2018: "IMPRESSION:  1. On the right side, there is a partially torn distal gluteus  medius tendon with considerable tendinopathy in the gluteus medius  and mild gluteus minimus tendinopathy, as well as adjacent  trochanteric bursitis.  2. There is also a small right anterior superior labral tear which  appears nondisplaced.  3. On the left side there is distal gluteus medius moderate  tendinopathy and also mild proximal hamstring tendinopathy along  with mild trochanteric bursitis.  4. Spondylosis and degenerative disc disease at L5-S1.  5. Multiple chronic small cystic lesions along the vaginal cuff.  6. Sigmoid colon diverticulosis."    Currently in Pain? Yes    Pain Score 4     Pain Onset More than a month ago              TREATMENT:     Manual therapy: to reduce pain and tissue tension, improve range of motion, neuromodulation, in order to promote improved ability to complete functional activities. - hooklying LAD through hip, 3x30 seconds each side to tolerance   Therapeutic exercise: to centralize symptoms and improve ROM and strength required for successful completion of functional activities.  - hooklying bridge with isometric hip abduction against yoga block (longways), 2x15, 5 second holds. - hooklying  bridge with isometric hip abduction against gait belt, 2x15, 5 second holds. - quadruped bird dog intermittently using UE/LE or  legs only, 2x10 each side.  - supine short arc quad, with 5 second hold, 7.5# ankle weights, 2x10 each side.  - standing heel raises DLS with B UE support, 2x15 - review of HEP    Pt required multimodal cuing for proper technique and to facilitate improved neuromuscular control, strength, range of motion, and functional ability resulting in improved performance and form.   HOME EXERCISE PROGRAM Access Code: [email or web address] URL: [email or web address] Date: 12/15/2020 Prepared by: Rosita Kea   Exercises Hooklying Isometric Hip Abduction with Belt - 1 x daily - 1 sets - 20 reps - 5 second hold hold Supine Hip Adduction Isometric with Ball - 1 x daily - 1 sets - 20 reps - 5 seconds hold Bridge on Heels -  1 x     PT Education - 02/15/21 1442     Education Details intervention/excercise technique/purpose. self-managemetn techniques    Person(s) Educated Patient    Methods Explanation;Demonstration;Tactile cues;Verbal cues    Comprehension Verbalized understanding;Returned demonstration;Verbal cues required;Tactile cues required;Need further instruction              PT Short Term Goals - 01/03/21 1526       PT SHORT TERM GOAL #1   Title Be independent with initial home exercise program for self-management of symptoms.    Baseline Initial HEP to be provided at visit 2 (12/13/2020);    Time 3    Period Weeks    Status Achieved    Target Date 01/03/21               PT Long Term Goals - 01/24/21 2105       PT LONG TERM GOAL #1   Title Be independent with a long-term home exercise program for self-management of symptoms.    Baseline Initial HEP to be provided at visit 2 (12/13/2020); currently participating in appropriate HEP (01/03/2021; 01/24/2021);    Time 12    Period Weeks    Status Partially Met   TARGET DATE FOR ALL LONG TERM GOALS:  03/07/2021     PT LONG TERM GOAL #2   Title Demonstrate improved FOTO score by 10 units to demonstrate improvement in overall condition and self-reported functional ability.    Baseline to be completed visit 2 as appropriate (12/13/2020); 31 (12/15/2020); 38 (01/03/2021); 38 (01/24/2021);    Time 12    Period Weeks    Status Partially Met      PT LONG TERM GOAL #3   Title Improve B hip strength to 4+/5 without increase in pain for improved ability to allow patient to complete valued functional tasks such as stairs and walking with less difficulty.    Baseline weak and painful - see objective exam (12/13/2020); weak and painful but with improvements - see objective exam (01/24/2021);    Time 12    Period Weeks    Status Partially Met      PT LONG TERM GOAL #4   Title Patient will demonstrate single leg stance balance on firm surface with eyes open of equal or greater than 30 seconds each side to demonstrate improved proprioception and balance for functional activities such as walking, stairs, community and household mobility tasks.    Baseline < 5 seconds both sides (12/13/2020); R 14 seconds, L 21 seconds (01/03/2021); R =15 seconds, L= 38 seconds (01/24/2021);    Time 12    Period Weeks    Status Partially Met      PT LONG TERM GOAL #5   Title Complete community, work and/or recreational activities without limitation due to current condition.    Baseline Functional Limitations:difficulty with ADLs, IADLs, activities that require weight bearing, walking, standing, stairs lifting, shaving legs, sleeping, hiking (12/13/2020); notes improvements but still quite limited (01/24/2021);    Time 12    Period Weeks    Status On-going                   Plan - 02/15/21 1526     Clinical Impression Statement Patient tolerated treatment well overall with no increase in pain during session. Appeared to have tolerated last session well but has not yet increased HEP volume to be ready to advance HEP.  Increased exercise volume slightly this session and encouraged improved participation in  HEP. Visit frequency reduced to 1x a week to allow patient more time between progressions of her exercise. Patient would benefit from continued management of limiting condition by skilled physical therapist to address remaining impairments and functional limitations to work towards stated goals and return to PLOF or maximal functional independence.    Personal Factors and Comorbidities Comorbidity 3+;Time since onset of injury/illness/exacerbation;Past/Current Experience;Fitness    Comorbidities Relevant past medical history and comorbidities include chronic back pain, chronic neck pain, hypermobile in several joints, GERD, fatigue, depression/anxiety, CAD (smallest artery in heart - no stent recommended - sees cardiologist no restrictions), skin cancer (cleared up a few years ago), former smoker.    Examination-Activity Limitations Squat;Stairs;Lift;Locomotion Level;Stand;Caring for Others;Carry;Sleep    Examination-Participation Restrictions Laundry;Cleaning;Shop;Community Activity;Occupation;Interpersonal Relationship    Stability/Clinical Decision Making Evolving/Moderate complexity    Rehab Potential Good    PT Frequency 2x / week    PT Duration 12 weeks    PT Treatment/Interventions ADLs/Self Care Home Management;Aquatic Therapy;Biofeedback;Electrical Stimulation;Cryotherapy;Moist Heat;Gait training;Stair training;Functional mobility training;Therapeutic activities;Therapeutic exercise;Balance training;Neuromuscular re-education;Patient/family education;Manual techniques;Passive range of motion;Taping;Dry needling;Spinal Manipulations;Joint Manipulations;Other (comment);Energy conservation    PT Next Visit Plan updated HEP as appropriate, LE strengthening as tolerated, pacing, glute med loading    PT Home Exercise Plan Medbridge Access Code: [email or web address]    Consulted and Agree with Plan of Care Patient              Patient will benefit from skilled therapeutic intervention in order to improve the following deficits and impairments:  Improper body mechanics, Pain, Decreased mobility, Hypermobility, Increased muscle spasms, Postural dysfunction, Decreased activity tolerance, Decreased endurance, Decreased strength, Impaired perceived functional ability, Difficulty walking, Abnormal gait, Decreased balance  Visit Diagnosis: Pain in right hip  Pain in left hip  Chronic bilateral low back pain, unspecified whether sciatica present  Difficulty in walking, not elsewhere classified  Muscle weakness (generalized)  Other muscle spasm     Problem List Patient Active Problem List   Diagnosis Date Noted   DDD (degenerative disc disease), lumbar 01/06/2021   Facet arthritis of lumbar region 01/06/2021   Trochanteric bursitis of both hips 11/30/2020   Special screening for malignant neoplasms, colon    Polyp of cecum    Pain in both feet 04/12/2020   Burning sensation of feet 04/12/2020   Pain of both hip joints 04/12/2020   Onychomycosis 04/12/2020   Fibrocystic breast disease (FCBD) 04/12/2020   Numbness in both legs 09/09/2019   Spinal stenosis in cervical region 05/09/2019   Facet arthropathy, cervical 05/09/2019   Chronic pain 11/27/2018   Obesity (BMI 30.0-34.9) 11/27/2018   Sprain and strain of hip and thigh 05/10/2018   Abnormal MRI, cervical spine 03/28/2018   Menopause 11/26/2017   Right upper quadrant pain 11/26/2017   Abnormal Pap smear of cervix 07/27/2017   Vasomotor flushing 07/19/2017   Depression 07/18/2017   Nausea 06/25/2017   Chronic pain of both hips 06/25/2017   OSA (obstructive sleep apnea) 04/18/2017   Annual physical exam 04/10/2017   Fatigue 12/16/2016   Chronic back pain 05/30/2016   Abdominal pain 03/12/2016   Anxiety and depression 12/28/2015   History of obstructive sleep apnea 12/15/2015   GERD (gastroesophageal reflux disease) 12/15/2015    Hyperlipidemia 12/15/2015   Pain medication agreement signed 03/25/2015   CAD in native artery 02/05/2015   Right supraspinatus tenosynovitis 08/17/2014   Crohn's disease involving terminal ileum ([place]) 03/30/2014   FH: colon polyps 03/30/2014   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   Everlean Alstrom. Graylon Good, PT, DPT 02/15/21, 3:27 PM   [facility] [address], [place], [address] Phone: [phone]   Fax:  [phone]  Name: Carolyn Esparza MRN: [id number] Date of Birth: 02-25-1966

## 2021-02-17 ENCOUNTER — Ambulatory Visit: Payer: Managed Care, Other (non HMO) | Admitting: Physical Therapy

## 2021-02-22 ENCOUNTER — Ambulatory Visit: Payer: Managed Care, Other (non HMO) | Admitting: Physical Therapy

## 2021-02-22 DIAGNOSIS — M545 Low back pain, unspecified: Secondary | ICD-10-CM

## 2021-02-22 DIAGNOSIS — M25551 Pain in right hip: Secondary | ICD-10-CM

## 2021-02-22 DIAGNOSIS — M6281 Muscle weakness (generalized): Secondary | ICD-10-CM

## 2021-02-22 DIAGNOSIS — R262 Difficulty in walking, not elsewhere classified: Secondary | ICD-10-CM

## 2021-02-22 DIAGNOSIS — M62838 Other muscle spasm: Secondary | ICD-10-CM

## 2021-02-22 DIAGNOSIS — G8929 Other chronic pain: Secondary | ICD-10-CM

## 2021-02-22 DIAGNOSIS — M25552 Pain in left hip: Secondary | ICD-10-CM

## 2021-02-22 NOTE — Therapy (Signed)
[facility] [address], [place], [address] Phone: [phone]   Fax:  [phone]  Physical Therapy Treatment  Patient Details  Name: Carolyn Esparza MRN: [id number] Date of Birth: [birth year] Referring Provider (PT): [name], [name], MD   Encounter Date: 02/22/2021   PT End of Session - 02/22/21 1614     Visit Number 13    Number of Visits 24    Date for PT Re-Evaluation 03/07/21    Authorization Type [employer] reporting period from 01/24/21    Authorization Time Period 60 visits Per calendar year. Auth is not requried/ref number [id number]    Authorization - Visit Number 21    Authorization - Number of Visits 60    Progress Note Due on Visit 10    PT Start Time 1520    PT Stop Time 1600    PT Time Calculation (min) 40 min    Activity Tolerance Patient tolerated treatment well;Patient limited by fatigue    Behavior During Therapy WFL for tasks assessed/performed             Past Medical History:  Diagnosis Date   Anxiety    ASCUS with positive high risk HPV cervical    CAD (coronary artery disease)    Chicken pox    Chronic pain    neck, back, b/l hips    Complication of anesthesia    DDD (degenerative disc disease), lumbar 01/06/2021   Depression    Facet arthritis of lumbar region 01/06/2021   GERD (gastroesophageal reflux disease)    History of Crohn's disease    Hyperlipidemia    Left ovarian cyst    s/p removal of 1 ovary ? which one removed per pt    Libido, decreased    Skin cancer    reports skin cancer removed from skin in 2016 or 2017   Thyroid nodule    repeat US resolved and Ct neck 05/2019 normal thyroid     Past Surgical History:  Procedure Laterality Date   ABDOMINAL HYSTERECTOMY     APPENDECTOMY  2004   BLADDER SURGERY     BREAST BIOPSY     x 2   COLONOSCOPY     COLONOSCOPY WITH PROPOFOL N/A 10/25/2020   Procedure: COLONOSCOPY WITH PROPOFOL;  Surgeon: Mauri Pole, MD;  Location: [facility];  Service: Endoscopy;  Laterality: N/A;   HEMOSTASIS CLIP PLACEMENT  10/25/2020   Procedure: HEMOSTASIS CLIP PLACEMENT;  Surgeon: Mauri Pole, MD;  Location: [facility];  Service: Endoscopy;;   OOPHORECTOMY Right    Unilateral Right (per MRI 04/17/2018)   POLYPECTOMY  10/25/2020   Procedure: POLYPECTOMY;  Surgeon: Mauri Pole, MD;  Location: [facility];  Service: Endoscopy;;   SUBMUCOSAL LIFTING INJECTION  10/25/2020   Procedure: SUBMUCOSAL LIFTING INJECTION;  Surgeon: Mauri Pole, MD;  Location: [facility];  Service: Endoscopy;;   WISDOM TOOTH EXTRACTION      There were no vitals filed for this visit.   Subjective Assessment - 02/22/21 1523     Subjective Patient reports she had a hard time the last few days. She started having more pain on Friday evening after going on a short dog walk on Friday morning. She got a catch in her back and could not stand up straight for a couple of days. She did her HEP twice with some of the more advanced exercises. She also did an exercise where she lay prone and moved her  legs into IR/ER with knees flexed to 90 degrees to "reset sacrum" but it did cause a lot of soreness and possibly contributed to back catch. Back does feel better today. R pain is 5/10 currently. Felt a little sore but okay after last PT session.    Pertinent History Patient is a 55 y.o. female who presents to outpatient physical therapy with a referral for medical diagnosis chronic pain of both hips, pain of both hip joints, low back pain, unspecified back pain laterality, unspecified chronicity, unspecified whether sciatica present,  trochanteric bursitis of both hips. This patient's chief complaints consist of chronic bilateral hip pain R > L in setting of chronic low back pain leading to the following functional deficits: difficulty with ADLs, IADLs, activities that require weight bearing, walking, standing, stairs lifting, shaving  legs, sleeping and decreased quality of life.   Relevant past medical history and comorbidities include chronic back pain, chronic neck pain, hypermobile in several joints, GERD, fatigue, depression/anxiety, CAD (smallest artery in heart - no stent recommended - sees cardiologist no restrictions), skin cancer (cleared up a few years ago).  Patient denies hx of stroke, seizures, lung problem, major cardiac events, diabetes, unexplained weight loss, changes in bowel or bladder problems, stumbling or dropping things, spinal surgeries, osteoporosis.    Limitations Standing;Walking;House hold activities   difficulty with ADLs, IADLs, activities that require weight bearing, walking, standing, stairs lifting, shaving legs, sleeping and decreased quality of life.   Diagnostic tests R&L hip MRI report 04/18/2018: "IMPRESSION:  1. On the right side, there is a partially torn distal gluteus  medius tendon with considerable tendinopathy in the gluteus medius  and mild gluteus minimus tendinopathy, as well as adjacent  trochanteric bursitis.  2. There is also a small right anterior superior labral tear which  appears nondisplaced.  3. On the left side there is distal gluteus medius moderate  tendinopathy and also mild proximal hamstring tendinopathy along  with mild trochanteric bursitis.  4. Spondylosis and degenerative disc disease at L5-S1.  5. Multiple chronic small cystic lesions along the vaginal cuff.  6. Sigmoid colon diverticulosis."    Pain Onset More than a month ago              TREATMENT:     Manual therapy: to reduce pain and tissue tension, improve range of motion, neuromodulation, in order to promote improved ability to complete functional activities. - hooklying LAD through hip, 3x30-40 seconds each side to tolerance   Therapeutic exercise: to centralize symptoms and improve ROM and strength required for successful completion of functional activities.  - hooklying bridge with isometric hip  abduction against yoga block (longways), 2x15, 5 second holds. - hooklying bridge with isometric hip abduction against gait belt, 2x15, 5 second holds. - quadruped bird dog intermittently using legs only, 3x10 each side.  - supine short arc quad, 1x8/10 R/L with 10# ankle weights, 2x10 each side 7.5# AW with 5 second hold.  - standing heel raises DLS with B UE support, 2x15    Pt required multimodal cuing for proper technique and to facilitate improved neuromuscular control, strength, range of motion, and functional ability resulting in improved performance and form.   HOME EXERCISE PROGRAM Access Code: [email or web address] URL: [email or web address] Date: 12/15/2020 Prepared by: Rosita Kea   Exercises Hooklying Isometric Hip Abduction with Belt - 1 x daily - 1 sets - 20 reps - 5 second hold hold Supine Hip Adduction Isometric with Ball - 1 x daily - 1 sets -  20 reps - 5 seconds hold Bridge on Heels - 1 x     PT Education - 02/22/21 1613     Education Details intervention/excercise technique/purpose. self-management techniques    Person(s) Educated Patient    Methods Explanation;Demonstration;Verbal cues    Comprehension Verbalized understanding;Returned demonstration;Verbal cues required;Need further instruction              PT Short Term Goals - 01/03/21 1526       PT SHORT TERM GOAL #1   Title Be independent with initial home exercise program for self-management of symptoms.    Baseline Initial HEP to be provided at visit 2 (12/13/2020);    Time 3    Period Weeks    Status Achieved    Target Date 01/03/21               PT Long Term Goals - 01/24/21 2105       PT LONG TERM GOAL #1   Title Be independent with a long-term home exercise program for self-management of symptoms.    Baseline Initial HEP to be provided at visit 2 (12/13/2020); currently participating in appropriate HEP (01/03/2021; 01/24/2021);    Time 12    Period Weeks    Status Partially Met    TARGET DATE FOR ALL LONG TERM GOALS: 03/07/2021     PT LONG TERM GOAL #2   Title Demonstrate improved FOTO score by 10 units to demonstrate improvement in overall condition and self-reported functional ability.    Baseline to be completed visit 2 as appropriate (12/13/2020); 31 (12/15/2020); 38 (01/03/2021); 38 (01/24/2021);    Time 12    Period Weeks    Status Partially Met      PT LONG TERM GOAL #3   Title Improve B hip strength to 4+/5 without increase in pain for improved ability to allow patient to complete valued functional tasks such as stairs and walking with less difficulty.    Baseline weak and painful - see objective exam (12/13/2020); weak and painful but with improvements - see objective exam (01/24/2021);    Time 12    Period Weeks    Status Partially Met      PT LONG TERM GOAL #4   Title Patient will demonstrate single leg stance balance on firm surface with eyes open of equal or greater than 30 seconds each side to demonstrate improved proprioception and balance for functional activities such as walking, stairs, community and household mobility tasks.    Baseline < 5 seconds both sides (12/13/2020); R 14 seconds, L 21 seconds (01/03/2021); R =15 seconds, L= 38 seconds (01/24/2021);    Time 12    Period Weeks    Status Partially Met      PT LONG TERM GOAL #5   Title Complete community, work and/or recreational activities without limitation due to current condition.    Baseline Functional Limitations:difficulty with ADLs, IADLs, activities that require weight bearing, walking, standing, stairs lifting, shaving legs, sleeping, hiking (12/13/2020); notes improvements but still quite limited (01/24/2021);    Time 12    Period Weeks    Status On-going                   Plan - 02/22/21 1613     Clinical Impression Statement Pateint tolerated treatment well with some report of fatigue but no increase in pain. Continued with well tolerated exercises with gentle progressions as  tolerated. Encouraged improved participation in [place]. No limitations in participation due to low  back pain today during session. Patient would benefit from continued management of limiting condition by skilled physical therapist to address remaining impairments and functional limitations to work towards stated goals and return to PLOF or maximal functional independence.    Personal Factors and Comorbidities Comorbidity 3+;Time since onset of injury/illness/exacerbation;Past/Current Experience;Fitness    Comorbidities Relevant past medical history and comorbidities include chronic back pain, chronic neck pain, hypermobile in several joints, GERD, fatigue, depression/anxiety, CAD (smallest artery in heart - no stent recommended - sees cardiologist no restrictions), skin cancer (cleared up a few years ago), former smoker.    Examination-Activity Limitations Squat;Stairs;Lift;Locomotion Level;Stand;Caring for Others;Carry;Sleep    Examination-Participation Restrictions Laundry;Cleaning;Shop;Community Activity;Occupation;Interpersonal Relationship    Stability/Clinical Decision Making Evolving/Moderate complexity    Rehab Potential Good    PT Frequency 2x / week    PT Duration 12 weeks    PT Treatment/Interventions ADLs/Self Care Home Management;Aquatic Therapy;Biofeedback;Electrical Stimulation;Cryotherapy;Moist Heat;Gait training;Stair training;Functional mobility training;Therapeutic activities;Therapeutic exercise;Balance training;Neuromuscular re-education;Patient/family education;Manual techniques;Passive range of motion;Taping;Dry needling;Spinal Manipulations;Joint Manipulations;Other (comment);Energy conservation    PT Next Visit Plan updated HEP as appropriate, LE strengthening as tolerated, pacing, glute med loading    PT Home Exercise Plan Medbridge Access Code: [email or web address]    Consulted and Agree with Plan of Care Patient             Patient will benefit from skilled therapeutic intervention  in order to improve the following deficits and impairments:  Improper body mechanics, Pain, Decreased mobility, Hypermobility, Increased muscle spasms, Postural dysfunction, Decreased activity tolerance, Decreased endurance, Decreased strength, Impaired perceived functional ability, Difficulty walking, Abnormal gait, Decreased balance  Visit Diagnosis: Pain in right hip  Pain in left hip  Chronic bilateral low back pain, unspecified whether sciatica present  Difficulty in walking, not elsewhere classified  Muscle weakness (generalized)  Other muscle spasm     Problem List Patient Active Problem List   Diagnosis Date Noted   DDD (degenerative disc disease), lumbar 01/06/2021   Facet arthritis of lumbar region 01/06/2021   Trochanteric bursitis of both hips 11/30/2020   Special screening for malignant neoplasms, colon    Polyp of cecum    Pain in both feet 04/12/2020   Burning sensation of feet 04/12/2020   Pain of both hip joints 04/12/2020   Onychomycosis 04/12/2020   Fibrocystic breast disease (FCBD) 04/12/2020   Numbness in both legs 09/09/2019   Spinal stenosis in cervical region 05/09/2019   Facet arthropathy, cervical 05/09/2019   Chronic pain 11/27/2018   Obesity (BMI 30.0-34.9) 11/27/2018   Sprain and strain of hip and thigh 05/10/2018   Abnormal MRI, cervical spine 03/28/2018   Menopause 11/26/2017   Right upper quadrant pain 11/26/2017   Abnormal Pap smear of cervix 07/27/2017   Vasomotor flushing 07/19/2017   Depression 07/18/2017   Nausea 06/25/2017   Chronic pain of both hips 06/25/2017   OSA (obstructive sleep apnea) 04/18/2017   Annual physical exam 04/10/2017   Fatigue 12/16/2016   Chronic back pain 05/30/2016   Abdominal pain 03/12/2016   Anxiety and depression 12/28/2015   History of obstructive sleep apnea 12/15/2015   GERD (gastroesophageal reflux disease) 12/15/2015   Hyperlipidemia 12/15/2015   Pain medication agreement signed 03/25/2015    CAD in native artery 02/05/2015   Right supraspinatus tenosynovitis 08/17/2014   Crohn's disease involving terminal ileum ([place]) 03/30/2014   FH: colon polyps 03/30/2014   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 Graylon Good, PT, DPT 02/22/21, 4:15 PM   [facility] [address], [place], [address] Phone: [phone]   Fax:  [phone]  Name: Carolyn Esparza MRN: [id number] Date of Birth: 09/06/1965

## 2021-02-23 ENCOUNTER — Other Ambulatory Visit: Payer: Self-pay | Admitting: Obstetrics and Gynecology

## 2021-02-23 ENCOUNTER — Encounter: Payer: Self-pay | Admitting: Obstetrics and Gynecology

## 2021-02-23 MED ORDER — ESTRADIOL 0.0375 MG/24HR TD PTWK: 0.0375 mg | MEDICATED_PATCH | TRANSDERMAL | 1 refills | Status: DC

## 2021-02-23 NOTE — Progress Notes (Signed)
Rx dose increase to 0.0375 mg climara patch due to increased sx with 0.025 mg dose.

## 2021-02-24 ENCOUNTER — Encounter: Payer: Managed Care, Other (non HMO) | Admitting: Physical Therapy

## 2021-02-28 ENCOUNTER — Encounter: Payer: Self-pay | Admitting: Physical Therapy

## 2021-02-28 DIAGNOSIS — R262 Difficulty in walking, not elsewhere classified: Secondary | ICD-10-CM

## 2021-02-28 DIAGNOSIS — M62838 Other muscle spasm: Secondary | ICD-10-CM

## 2021-02-28 DIAGNOSIS — M25551 Pain in right hip: Secondary | ICD-10-CM

## 2021-02-28 DIAGNOSIS — M6281 Muscle weakness (generalized): Secondary | ICD-10-CM

## 2021-02-28 DIAGNOSIS — M25552 Pain in left hip: Secondary | ICD-10-CM

## 2021-02-28 DIAGNOSIS — G8929 Other chronic pain: Secondary | ICD-10-CM

## 2021-02-28 DIAGNOSIS — M545 Low back pain, unspecified: Secondary | ICD-10-CM

## 2021-02-28 NOTE — Therapy (Addendum)
[facility] [address], [place], [address] Phone: [phone]   Fax:  [phone]  Physical Therapy No-Visit Discharge Summary Dates of reporting: 12/13/2020 - 02/28/2021  Patient Details  Name: Carolyn Esparza MRN: [id number] Date of Birth: 09-Dec-1965 Referring Provider (PT): [name], [name], MD   Encounter Date: 02/28/2021    Past Medical History:  Diagnosis Date   Anxiety    ASCUS with positive high risk HPV cervical    CAD (coronary artery disease)    Chicken pox    Chronic pain    neck, back, b/l hips    Complication of anesthesia    DDD (degenerative disc disease), lumbar 01/06/2021   Depression    Facet arthritis of lumbar region 01/06/2021   GERD (gastroesophageal reflux disease)    History of Crohn's disease    Hyperlipidemia    Left ovarian cyst    s/p removal of 1 ovary ? which one removed per pt    Libido, decreased    Skin cancer    reports skin cancer removed from skin in 2016 or 2017   Thyroid nodule    repeat US resolved and Ct neck 05/2019 normal thyroid     Past Surgical History:  Procedure Laterality Date   ABDOMINAL HYSTERECTOMY     APPENDECTOMY  2004   BLADDER SURGERY     BREAST BIOPSY     x 2   COLONOSCOPY     COLONOSCOPY WITH PROPOFOL N/A 10/25/2020   Procedure: COLONOSCOPY WITH PROPOFOL;  Surgeon: Mauri Pole, MD;  Location: [facility];  Service: Endoscopy;  Laterality: N/A;   HEMOSTASIS CLIP PLACEMENT  10/25/2020   Procedure: HEMOSTASIS CLIP PLACEMENT;  Surgeon: Mauri Pole, MD;  Location: [facility];  Service: Endoscopy;;   OOPHORECTOMY Right    Unilateral Right (per MRI 04/17/2018)   POLYPECTOMY  10/25/2020   Procedure: POLYPECTOMY;  Surgeon: Mauri Pole, MD;  Location: [facility];  Service: Endoscopy;;   SUBMUCOSAL LIFTING INJECTION  10/25/2020   Procedure: SUBMUCOSAL LIFTING INJECTION;  Surgeon: Mauri Pole, MD;  Location: [facility];  Service: Endoscopy;;   WISDOM TOOTH EXTRACTION      There were no vitals filed for this visit.   Subjective Assessment - 02/28/21 1302     Subjective Patient called today and canceled all future appointments. PT called back and patient reported feeling that she had gone as far with PT as she was going to be able to due to chronic pain. Stated she had no further PT needs at this time.    Pertinent History Patient is a 55 y.o. female who presents to outpatient physical therapy with a referral for medical diagnosis chronic pain of both hips, pain of both hip joints, low back pain, unspecified back pain laterality, unspecified chronicity, unspecified whether sciatica present,  trochanteric bursitis of both hips. This patient's chief complaints consist of chronic bilateral hip pain R > L in setting of chronic low back pain leading to the following functional deficits: difficulty with ADLs, IADLs, activities that require weight bearing, walking, standing, stairs lifting, shaving legs, sleeping and decreased quality of life.   Relevant past medical history and comorbidities include chronic back pain, chronic neck pain, hypermobile in several joints, GERD, fatigue, depression/anxiety, CAD (smallest artery in heart - no stent recommended - sees cardiologist no restrictions), skin cancer (cleared up a few years ago).  Patient denies hx of stroke, seizures, lung problem, major cardiac events, diabetes,  unexplained weight loss, changes in bowel or bladder problems, stumbling or dropping things, spinal surgeries, osteoporosis.    Limitations Standing;Walking;House hold activities   difficulty with ADLs, IADLs, activities that require weight bearing, walking, standing, stairs lifting, shaving legs, sleeping and decreased quality of life.   Diagnostic tests R&L hip MRI report 04/18/2018: "IMPRESSION:  1. On the right side, there is a partially torn distal gluteus  medius tendon with considerable tendinopathy  in the gluteus medius  and mild gluteus minimus tendinopathy, as well as adjacent  trochanteric bursitis.  2. There is also a small right anterior superior labral tear which  appears nondisplaced.  3. On the left side there is distal gluteus medius moderate  tendinopathy and also mild proximal hamstring tendinopathy along  with mild trochanteric bursitis.  4. Spondylosis and degenerative disc disease at L5-S1.  5. Multiple chronic small cystic lesions along the vaginal cuff.  6. Sigmoid colon diverticulosis."             OBJECTIVE Patient is not present for examination at this time. Please see previous documentation for latest objective data.     PT Short Term Goals - 01/03/21 1526       PT SHORT TERM GOAL #1   Title Be independent with initial home exercise program for self-management of symptoms.    Baseline Initial HEP to be provided at visit 2 (12/13/2020);    Time 3    Period Weeks    Status Achieved    Target Date 01/03/21               PT Long Term Goals - 02/28/21 1303       PT LONG TERM GOAL #1   Title Be independent with a long-term home exercise program for self-management of symptoms.    Baseline Initial HEP to be provided at visit 2 (12/13/2020); currently participating in appropriate HEP (01/03/2021; 01/24/2021); provided with long term HEP (02/28/2021);    Time 12    Period Weeks    Status Partially Met   TARGET DATE FOR ALL LONG TERM GOALS: 03/07/2021     PT LONG TERM GOAL #2   Title Demonstrate improved FOTO score by 10 units to demonstrate improvement in overall condition and self-reported functional ability.    Baseline to be completed visit 2 as appropriate (12/13/2020); 31 (12/15/2020); 38 (01/03/2021); 38 (01/24/2021);    Time 12    Period Weeks    Status Partially Met      PT LONG TERM GOAL #3   Title Improve B hip strength to 4+/5 without increase in pain for improved ability to allow patient to complete valued functional tasks such as stairs and walking with  less difficulty.    Baseline weak and painful - see objective exam (12/13/2020); weak and painful but with improvements - see objective exam (01/24/2021);    Time 12    Period Weeks    Status Partially Met      PT LONG TERM GOAL #4   Title Patient will demonstrate single leg stance balance on firm surface with eyes open of equal or greater than 30 seconds each side to demonstrate improved proprioception and balance for functional activities such as walking, stairs, community and household mobility tasks.    Baseline < 5 seconds both sides (12/13/2020); R 14 seconds, L 21 seconds (01/03/2021); R =15 seconds, L= 38 seconds (01/24/2021);    Time 12    Period Weeks    Status Partially Met  PT LONG TERM GOAL #5   Title Complete community, work and/or recreational activities without limitation due to current condition.    Baseline Functional Limitations:difficulty with ADLs, IADLs, activities that require weight bearing, walking, standing, stairs lifting, shaving legs, sleeping, hiking (12/13/2020); notes improvements but still quite limited (01/24/2021);    Time 12    Period Weeks    Status Not Met                Plan - 02/28/21 1306     Clinical Impression Statement Patient attended 13 physical therapy sessions this episode of care. She made some progress towards goals but struggled with decreased activity tolerance and pain the day after new activity limiting her progress. Patient is now being discharged due to her request, feeling that she had made as much progress as she can. Patient would benefit from continued management of limiting condition by skilled physical therapist to address remaining impairments and functional limitations to work towards stated goals and return to PLOF or maximal functional independence.    Personal Factors and Comorbidities Comorbidity 3+;Time since onset of injury/illness/exacerbation;Past/Current Experience;Fitness    Comorbidities Relevant past medical  history and comorbidities include chronic back pain, chronic neck pain, hypermobile in several joints, GERD, fatigue, depression/anxiety, CAD (smallest artery in heart - no stent recommended - sees cardiologist no restrictions), skin cancer (cleared up a few years ago), former smoker.    Examination-Activity Limitations Squat;Stairs;Lift;Locomotion Level;Stand;Caring for Others;Carry;Sleep    Examination-Participation Restrictions Laundry;Cleaning;Shop;Community Activity;Occupation;Interpersonal Relationship    Stability/Clinical Decision Making Evolving/Moderate complexity    Rehab Potential Good    PT Frequency 2x / week    PT Duration 12 weeks    PT Treatment/Interventions ADLs/Self Care Home Management;Aquatic Therapy;Biofeedback;Electrical Stimulation;Cryotherapy;Moist Heat;Gait training;Stair training;Functional mobility training;Therapeutic activities;Therapeutic exercise;Balance training;Neuromuscular re-education;Patient/family education;Manual techniques;Passive range of motion;Taping;Dry needling;Spinal Manipulations;Joint Manipulations;Other (comment);Energy conservation    PT Next Visit Plan Patient is now discharged from [facility] Access Code: [email or web address]    Consulted and Agree with Plan of Care Patient             Patient will benefit from skilled therapeutic intervention in order to improve the following deficits and impairments:  Improper body mechanics, Pain, Decreased mobility, Hypermobility, Increased muscle spasms, Postural dysfunction, Decreased activity tolerance, Decreased endurance, Decreased strength, Impaired perceived functional ability, Difficulty walking, Abnormal gait, Decreased balance  Visit Diagnosis: Pain in right hip  Pain in left hip  Chronic bilateral low back pain, unspecified whether sciatica present  Difficulty in walking, not elsewhere classified  Muscle weakness (generalized)  Other muscle spasm     Problem  List Patient Active Problem List   Diagnosis Date Noted   DDD (degenerative disc disease), lumbar 01/06/2021   Facet arthritis of lumbar region 01/06/2021   Trochanteric bursitis of both hips 11/30/2020   Special screening for malignant neoplasms, colon    Polyp of cecum    Pain in both feet 04/12/2020   Burning sensation of feet 04/12/2020   Pain of both hip joints 04/12/2020   Onychomycosis 04/12/2020   Fibrocystic breast disease (FCBD) 04/12/2020   Numbness in both legs 09/09/2019   Spinal stenosis in cervical region 05/09/2019   Facet arthropathy, cervical 05/09/2019   Chronic pain 11/27/2018   Obesity (BMI 30.0-34.9) 11/27/2018   Sprain and strain of hip and thigh 05/10/2018   Abnormal MRI, cervical spine 03/28/2018   Menopause 11/26/2017   Right upper quadrant pain 11/26/2017   Abnormal Pap smear of  cervix 07/27/2017   Vasomotor flushing 07/19/2017   Depression 07/18/2017   Nausea 06/25/2017   Chronic pain of both hips 06/25/2017   OSA (obstructive sleep apnea) 04/18/2017   Annual physical exam 04/10/2017   Fatigue 12/16/2016   Chronic back pain 05/30/2016   Abdominal pain 03/12/2016   Anxiety and depression 12/28/2015   History of obstructive sleep apnea 12/15/2015   GERD (gastroesophageal reflux disease) 12/15/2015   Hyperlipidemia 12/15/2015   Pain medication agreement signed 03/25/2015   CAD in native artery 02/05/2015   Right supraspinatus tenosynovitis 08/17/2014   Crohn's disease involving terminal ileum ([place]) 03/30/2014   FH: colon polyps 03/30/2014   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 Graylon Good, PT, DPT 02/28/21, 1:06 PM   [facility] [address] [place], [address] Phone: [phone]   Fax:  [phone]  Name: Carolyn Esparza MRN:  [id number] Date of Birth: 12-Sep-1965

## 2021-03-01 ENCOUNTER — Ambulatory Visit: Payer: Managed Care, Other (non HMO) | Admitting: Physical Therapy

## 2021-03-03 ENCOUNTER — Encounter: Payer: Managed Care, Other (non HMO) | Admitting: Physical Therapy

## 2021-03-03 ENCOUNTER — Ambulatory Visit: Payer: Managed Care, Other (non HMO) | Attending: Anesthesiology | Admitting: Anesthesiology

## 2021-03-03 ENCOUNTER — Other Ambulatory Visit: Payer: Self-pay

## 2021-03-03 ENCOUNTER — Encounter: Payer: Self-pay | Admitting: Anesthesiology

## 2021-03-03 DIAGNOSIS — M47816 Spondylosis without myelopathy or radiculopathy, lumbar region: Secondary | ICD-10-CM | POA: Diagnosis not present

## 2021-03-03 DIAGNOSIS — M5136 Other intervertebral disc degeneration, lumbar region: Secondary | ICD-10-CM | POA: Diagnosis not present

## 2021-03-03 DIAGNOSIS — M545 Low back pain, unspecified: Secondary | ICD-10-CM

## 2021-03-03 DIAGNOSIS — G894 Chronic pain syndrome: Secondary | ICD-10-CM

## 2021-03-03 DIAGNOSIS — F1190 Opioid use, unspecified, uncomplicated: Secondary | ICD-10-CM

## 2021-03-03 DIAGNOSIS — G8929 Other chronic pain: Secondary | ICD-10-CM

## 2021-03-03 MED ORDER — FENTANYL 12 MCG/HR TD PT72: 1.0000 | MEDICATED_PATCH | TRANSDERMAL | 0 refills | Status: DC

## 2021-03-03 MED ORDER — HYDROCODONE-ACETAMINOPHEN 10-325 MG PO TABS: 1.0000 | ORAL_TABLET | Freq: Every day | ORAL | 0 refills | Status: DC | PRN

## 2021-03-03 NOTE — Progress Notes (Signed)
Virtual Visit via Telephone Note  I connected with Carolyn Esparza on 03/03/21 at  4:15 PM EDT by telephone and verified that I am speaking with the correct person using two identifiers.  Location: Patient: Home Provider: Pain control center   I discussed the limitations, risks, security and privacy concerns of performing an evaluation and management service by telephone and the availability of in person appointments. I also discussed with the patient that there may be a patient responsible charge related to this service. The patient expressed understanding and agreed to proceed.   History of Present Illness: I spoke with Carolyn Esparza today via telephone as we were unable to link for the video portion of the conference and she reports that she is doing well and stable in regards to her low back pain.  The quality characteristic and distribution of her symptoms are stable with no changes in lower extremity strength or function or bowel or bladder function.  She is taking her medications at the 12 mic Duragesic patch and a hydrocodone at 10 mg as needed.  She uses this sparingly and for breakthrough pain.  She has been on this regimen for an extended period of time.  She recently transferred over to our service.  No side effects with the medications are reported and she continues to derive good functional benefits with them and is able to stay active utilizing these medications.  She has failed more conservative therapy.  In the past she also has had radiofrequency ablation for facet disease but is holding off on this at this time.  Review of systems: General: No fevers or chills Pulmonary: No shortness of breath or dyspnea Cardiac: No angina or palpitations or lightheadedness GI: No abdominal pain or constipation Psych: No depression    Observations/Objective:   Current Outpatient Medications:    atorvastatin (LIPITOR) 10 MG tablet, TAKE 1 TABLET(10 MG) BY MOUTH DAILY AT NIGHT, Disp: 90  tablet, Rfl: 1   buPROPion (WELLBUTRIN XL) 300 MG 24 hr tablet, TAKE 1 TABLET(300 MG) BY MOUTH DAILY, Disp: 90 tablet, Rfl: 3   celecoxib (CELEBREX) 200 MG capsule, Take 1 capsule (200 mg total) by mouth daily. (Patient taking differently: Take 200 mg by mouth every other day.), Disp: 90 capsule, Rfl: 3   estradiol (CLIMARA - DOSED IN MG/24 HR) 0.0375 mg/24hr patch, Place 1 patch (0.0375 mg total) onto the skin once a week., Disp: 12 patch, Rfl: 1   fentaNYL (DURAGESIC) 12 MCG/HR, Place 1 patch onto the skin every 3 (three) days., Disp: 10 patch, Rfl: 0   HYDROcodone-acetaminophen (NORCO) 10-325 MG tablet, Take 1 tablet by mouth daily as needed for severe pain., Disp: 15 tablet, Rfl: 0   lidocaine (LIDODERM) 5 %, Place 2 patches onto the skin daily. , Disp: , Rfl:    Medium Chain Triglycerides (MCT OIL PO), Take by mouth., Disp: , Rfl:    Multiple Vitamin (MULTI-VITAMINS) TABS, Take 1 tablet by mouth daily. , Disp: , Rfl:    Omega-3 Fatty Acids (FISH OIL PO), Take 1 capsule by mouth daily. , Disp: , Rfl:    omeprazole (PRILOSEC) 20 MG capsule, Take 1 capsule (20 mg total) by mouth daily. In am before food 30 minutes, Disp: 90 capsule, Rfl: 3   promethazine (PHENERGAN) 25 MG tablet, Take 1 tablet (25 mg total) by mouth 2 (two) times daily as needed for nausea or vomiting., Disp: 30 tablet, Rfl: 2   promethazine (PHENERGAN) 25 MG tablet, Take 1 tablet (25 mg total)  by mouth daily as needed for nausea or vomiting., Disp: 30 tablet, Rfl: 5   tiZANidine (ZANAFLEX) 4 MG capsule, Take 1 capsule (4 mg total) by mouth at bedtime as needed for muscle spasms., Disp: 30 capsule, Rfl: 5   TRINTELLIX 5 MG TABS tablet, Take 5 mg by mouth 2 (two) times a week. (Patient not taking: Reported on 12/13/2020), Disp: , Rfl:    valACYclovir (VALTREX) 1000 MG tablet, 2 pills day 1 then bid X 3-7 days prn, Disp: 60 tablet, Rfl: 11  Assessment and Plan: 1. Chronic pain syndrome   2. Chronic bilateral low back pain without  sciatica   3. DDD (degenerative disc disease), lumbar   4. Facet arthritis of lumbar region   5. Chronic, continuous use of opioids   6. Spondylosis of lumbar region without myelopathy or radiculopathy   Based on her discussion today and in review of the [facility] practitioner database information I think is appropriate to refill her medications.  This will be for the next month.  Will be for the Duragesic and hydrocodone.  No other changes in her pain management regimen are initiated.  We will schedule her for a 1 month return to clinic and she is to continue follow-up with her primary care physicians for baseline medical care as indicated.  25  Follow Up Instructions:    I discussed the assessment and treatment plan with the patient. The patient was provided an opportunity to ask questions and all were answered. The patient agreed with the plan and demonstrated an understanding of the instructions.   The patient was advised to call back or seek an in-person evaluation if the symptoms worsen or if the condition fails to improve as anticipated.  I provided 25 minutes of non-face-to-face time during this encounter.   Molli Barrows, MD

## 2021-03-07 ENCOUNTER — Encounter: Payer: Self-pay | Admitting: Internal Medicine

## 2021-03-08 ENCOUNTER — Ambulatory Visit: Payer: Managed Care, Other (non HMO) | Admitting: Physical Therapy

## 2021-03-08 NOTE — Telephone Encounter (Signed)
Okay for referral?

## 2021-03-10 ENCOUNTER — Encounter: Payer: Managed Care, Other (non HMO) | Admitting: Physical Therapy

## 2021-03-14 ENCOUNTER — Encounter: Payer: Managed Care, Other (non HMO) | Admitting: Physical Therapy

## 2021-03-16 ENCOUNTER — Encounter: Payer: Managed Care, Other (non HMO) | Admitting: Physical Therapy

## 2021-03-21 ENCOUNTER — Encounter: Payer: Managed Care, Other (non HMO) | Admitting: Physical Therapy

## 2021-03-23 ENCOUNTER — Encounter: Payer: Managed Care, Other (non HMO) | Admitting: Physical Therapy

## 2021-03-26 ENCOUNTER — Encounter: Payer: Self-pay | Admitting: Obstetrics and Gynecology

## 2021-03-26 DIAGNOSIS — N951 Menopausal and female climacteric states: Secondary | ICD-10-CM

## 2021-03-26 DIAGNOSIS — Z79890 Hormone replacement therapy (postmenopausal): Secondary | ICD-10-CM

## 2021-03-26 DIAGNOSIS — R6882 Decreased libido: Secondary | ICD-10-CM

## 2021-03-28 ENCOUNTER — Encounter: Payer: Managed Care, Other (non HMO) | Admitting: Physical Therapy

## 2021-03-28 MED ORDER — EST ESTROGENS-METHYLTEST 0.625-1.25 MG PO TABS: 1.0000 | ORAL_TABLET | Freq: Every day | ORAL | 0 refills | Status: DC

## 2021-03-30 ENCOUNTER — Other Ambulatory Visit: Payer: Self-pay

## 2021-03-30 ENCOUNTER — Ambulatory Visit: Payer: Managed Care, Other (non HMO) | Attending: Anesthesiology | Admitting: Anesthesiology

## 2021-03-30 ENCOUNTER — Other Ambulatory Visit: Payer: Self-pay | Admitting: Obstetrics and Gynecology

## 2021-03-30 ENCOUNTER — Encounter: Payer: Self-pay | Admitting: Anesthesiology

## 2021-03-30 DIAGNOSIS — G894 Chronic pain syndrome: Secondary | ICD-10-CM | POA: Diagnosis not present

## 2021-03-30 DIAGNOSIS — M545 Low back pain, unspecified: Secondary | ICD-10-CM

## 2021-03-30 DIAGNOSIS — M5136 Other intervertebral disc degeneration, lumbar region: Secondary | ICD-10-CM | POA: Diagnosis not present

## 2021-03-30 DIAGNOSIS — Z79890 Hormone replacement therapy (postmenopausal): Secondary | ICD-10-CM

## 2021-03-30 DIAGNOSIS — F1190 Opioid use, unspecified, uncomplicated: Secondary | ICD-10-CM | POA: Diagnosis not present

## 2021-03-30 DIAGNOSIS — N951 Menopausal and female climacteric states: Secondary | ICD-10-CM

## 2021-03-30 DIAGNOSIS — M51369 Other intervertebral disc degeneration, lumbar region without mention of lumbar back pain or lower extremity pain: Secondary | ICD-10-CM

## 2021-03-30 DIAGNOSIS — G8929 Other chronic pain: Secondary | ICD-10-CM

## 2021-03-30 DIAGNOSIS — M47816 Spondylosis without myelopathy or radiculopathy, lumbar region: Secondary | ICD-10-CM

## 2021-03-30 DIAGNOSIS — R6882 Decreased libido: Secondary | ICD-10-CM

## 2021-03-30 MED ORDER — FENTANYL 12 MCG/HR TD PT72: 1.0000 | MEDICATED_PATCH | TRANSDERMAL | 0 refills | Status: DC

## 2021-03-30 MED ORDER — EST ESTROGENS-METHYLTEST 0.625-1.25 MG PO TABS: 1.0000 | ORAL_TABLET | Freq: Every day | ORAL | 1 refills | Status: DC

## 2021-03-30 MED ORDER — HYDROCODONE-ACETAMINOPHEN 10-325 MG PO TABS: 1.0000 | ORAL_TABLET | Freq: Every day | ORAL | 0 refills | Status: DC | PRN

## 2021-03-30 NOTE — Progress Notes (Signed)
Virtual Visit via Telephone Note  I connected with [name] on 03/30/21 at  1:45 PM EDT by telephone and verified that I am speaking with the correct person using two identifiers.  Location: Patient: Home Provider: Pain control center   I discussed the limitations, risks, security and privacy concerns of performing an evaluation and management service by telephone and the availability of in person appointments. I also discussed with the patient that there may be a patient responsible charge related to this service. The patient expressed understanding and agreed to proceed.   History of Present Illness:  I spoke with Carolyn Esparza today via telephone as we were unable to link for the video portion of the conference.  She reports that she is doing well with her low back pain and current pain management regimen.  She uses a Duragesic patch every 3 days for application and uses her hydrocodone for breakthrough pain generally once a day.  This combination therapy has worked well for her for an extended period of time and keeps her pain under good control.  No other changes in the quality characteristic or distribution of her low back pain or leg pain are noted.  She is staying active and doing her physical therapy exercises as best tolerated and works a full-time schedule she reports.  Otherwise low back pain quality is stable with no changes in lower extremity strength or function or bowel or bladder function.  Review of systems: General: No fevers or chills Pulmonary: No shortness of breath or dyspnea Cardiac: No angina or palpitations or lightheadedness GI: No abdominal pain or constipation Psych: No depression  Observations/Objective:  Current Outpatient Medications:    atorvastatin (LIPITOR) 10 MG tablet, TAKE 1 TABLET(10 MG) BY MOUTH DAILY AT NIGHT, Disp: 90 tablet, Rfl: 1   buPROPion (WELLBUTRIN XL) 300 MG 24 hr tablet, TAKE 1 TABLET(300 MG) BY MOUTH DAILY, Disp: 90 tablet, Rfl: 3   celecoxib  (CELEBREX) 200 MG capsule, Take 1 capsule (200 mg total) by mouth daily. (Patient taking differently: Take 200 mg by mouth every other day.), Disp: 90 capsule, Rfl: 3   estrogen-methylTESTOSTERone 0.625-1.25 MG tablet, Take 1 tablet by mouth daily., Disp: 30 tablet, Rfl: 1   [START ON 04/01/2021] fentaNYL (DURAGESIC) 12 MCG/HR, Place 1 patch onto the skin every 3 (three) days., Disp: 10 patch, Rfl: 0   [START ON 04/01/2021] HYDROcodone-acetaminophen (NORCO) 10-325 MG tablet, Take 1 tablet by mouth daily as needed for severe pain., Disp: 15 tablet, Rfl: 0   lidocaine (LIDODERM) 5 %, Place 2 patches onto the skin daily. , Disp: , Rfl:    Medium Chain Triglycerides (MCT OIL PO), Take by mouth., Disp: , Rfl:    Multiple Vitamin (MULTI-VITAMINS) TABS, Take 1 tablet by mouth daily. , Disp: , Rfl:    Omega-3 Fatty Acids (FISH OIL PO), Take 1 capsule by mouth daily. , Disp: , Rfl:    omeprazole (PRILOSEC) 20 MG capsule, Take 1 capsule (20 mg total) by mouth daily. In am before food 30 minutes, Disp: 90 capsule, Rfl: 3   promethazine (PHENERGAN) 25 MG tablet, Take 1 tablet (25 mg total) by mouth 2 (two) times daily as needed for nausea or vomiting., Disp: 30 tablet, Rfl: 2   promethazine (PHENERGAN) 25 MG tablet, Take 1 tablet (25 mg total) by mouth daily as needed for nausea or vomiting., Disp: 30 tablet, Rfl: 5   tiZANidine (ZANAFLEX) 4 MG capsule, Take 1 capsule (4 mg total) by mouth at bedtime as needed  for muscle spasms., Disp: 30 capsule, Rfl: 5   TRINTELLIX 5 MG TABS tablet, Take 5 mg by mouth 2 (two) times a week. (Patient not taking: Reported on 12/13/2020), Disp: , Rfl:    valACYclovir (VALTREX) 1000 MG tablet, 2 pills day 1 then bid X 3-7 days prn, Disp: 60 tablet, Rfl: 11   Assessment and Plan: 1. Chronic pain syndrome   2. Chronic bilateral low back pain without sciatica   3. DDD (degenerative disc disease), lumbar   4. Chronic, continuous use of opioids   5. Spondylosis of lumbar region without  myelopathy or radiculopathy   6. Facet arthritis of lumbar region   Based on her discussion today and upon review of the [facility] practitioner database information it is appropriate for refills for next month.  She will be leaving town approximately the time of her next refill and these will be dated for September 3 to assist.  We will schedule her for a 1 month return to clinic.  No other changes will be made in her pain management protocol.  I encouraged her to continue with stretching strengthening exercises as reviewed with return as mentioned and continue follow-up with primary care physician for baseline medical care.  Follow Up Instructions:    I discussed the assessment and treatment plan with the patient. The patient was provided an opportunity to ask questions and all were answered. The patient agreed with the plan and demonstrated an understanding of the instructions.   The patient was advised to call back or seek an in-person evaluation if the symptoms worsen or if the condition fails to improve as anticipated.  I provided 25 minutes of non-face-to-face time during this encounter.   Molli Barrows, MD

## 2021-03-30 NOTE — Progress Notes (Signed)
Rx estratest to [employer] for cheaper GoodRx coupon

## 2021-03-31 ENCOUNTER — Encounter: Payer: Managed Care, Other (non HMO) | Admitting: Physical Therapy

## 2021-04-05 ENCOUNTER — Encounter: Payer: Managed Care, Other (non HMO) | Admitting: Physical Therapy

## 2021-04-13 LAB — COMPREHENSIVE METABOLIC PANEL
ALT: 17 IU/L (ref 0–32)
AST: 26 IU/L (ref 0–40)
Albumin/Globulin Ratio: 2.4 — ABNORMAL HIGH (ref 1.2–2.2)
Albumin: 4.5 g/dL (ref 3.8–4.9)
Alkaline Phosphatase: 51 IU/L (ref 44–121)
BUN/Creatinine Ratio: 20 (ref 9–23)
BUN: 19 mg/dL (ref 6–24)
Bilirubin Total: 0.2 mg/dL (ref 0.0–1.2)
CO2: 23 mmol/L (ref 20–29)
Calcium: 9.2 mg/dL (ref 8.7–10.2)
Chloride: 103 mmol/L (ref 96–106)
Creatinine, Ser: 0.95 mg/dL (ref 0.57–1.00)
Globulin, Total: 1.9 g/dL (ref 1.5–4.5)
Glucose: 87 mg/dL (ref 65–99)
Potassium: 3.9 mmol/L (ref 3.5–5.2)
Sodium: 143 mmol/L (ref 134–144)
Total Protein: 6.4 g/dL (ref 6.0–8.5)
eGFR: 71 mL/min/{1.73_m2} (ref >59)

## 2021-04-13 LAB — LIPID PANEL
Chol/HDL Ratio: 3.9 ratio (ref 0.0–4.4)
Cholesterol, Total: 182 mg/dL (ref 100–199)
HDL: 47 mg/dL (ref >39)
LDL Chol Calc (NIH): 113 mg/dL — ABNORMAL HIGH (ref 0–99)
Triglycerides: 122 mg/dL (ref 0–149)
VLDL Cholesterol Cal: 22 mg/dL (ref 5–40)

## 2021-04-13 LAB — CBC WITH DIFFERENTIAL/PLATELET
Basophils Absolute: 0 10*3/uL (ref 0.0–0.2)
Basos: 0 %
EOS (ABSOLUTE): 0.1 10*3/uL (ref 0.0–0.4)
Eos: 2 %
Hematocrit: 41.1 % (ref 34.0–46.6)
Hemoglobin: 13.6 g/dL (ref 11.1–15.9)
Immature Grans (Abs): 0 10*3/uL (ref 0.0–0.1)
Immature Granulocytes: 1 %
Lymphocytes Absolute: 1.7 10*3/uL (ref 0.7–3.1)
Lymphs: 30 %
MCH: 31.4 pg (ref 26.6–33.0)
MCHC: 33.1 g/dL (ref 31.5–35.7)
MCV: 95 fL (ref 79–97)
Monocytes Absolute: 0.5 10*3/uL (ref 0.1–0.9)
Monocytes: 8 %
Neutrophils Absolute: 3.4 10*3/uL (ref 1.4–7.0)
Neutrophils: 59 %
Platelets: 305 10*3/uL (ref 150–450)
RBC: 4.33 x10E6/uL (ref 3.77–5.28)
RDW: 12.3 % (ref 11.7–15.4)
WBC: 5.8 10*3/uL (ref 3.4–10.8)

## 2021-04-13 LAB — URINALYSIS, ROUTINE W REFLEX MICROSCOPIC
Bilirubin, UA: NEGATIVE
Glucose, UA: NEGATIVE
Ketones, UA: NEGATIVE
Leukocytes,UA: NEGATIVE
Nitrite, UA: NEGATIVE
Protein,UA: NEGATIVE
RBC, UA: NEGATIVE
Specific Gravity, UA: 1.008 (ref 1.005–1.030)
Urobilinogen, Ur: 0.2 mg/dL (ref 0.2–1.0)
pH, UA: 6.5 (ref 5.0–7.5)

## 2021-04-13 LAB — HEMOGLOBIN A1C
Est. average glucose Bld gHb Est-mCnc: 103 mg/dL
Hgb A1c MFr Bld: 5.2 % (ref 4.8–5.6)

## 2021-04-13 LAB — TSH: TSH: 3.36 u[IU]/mL (ref 0.450–4.500)

## 2021-04-21 ENCOUNTER — Other Ambulatory Visit: Payer: Self-pay | Admitting: Surgery

## 2021-04-21 DIAGNOSIS — M7061 Trochanteric bursitis, right hip: Secondary | ICD-10-CM

## 2021-04-21 DIAGNOSIS — S76011D Strain of muscle, fascia and tendon of right hip, subsequent encounter: Secondary | ICD-10-CM

## 2021-04-21 DIAGNOSIS — M7062 Trochanteric bursitis, left hip: Secondary | ICD-10-CM

## 2021-04-23 ENCOUNTER — Other Ambulatory Visit: Payer: Self-pay | Admitting: Internal Medicine

## 2021-04-23 DIAGNOSIS — M25551 Pain in right hip: Secondary | ICD-10-CM

## 2021-04-23 DIAGNOSIS — M542 Cervicalgia: Secondary | ICD-10-CM

## 2021-04-28 ENCOUNTER — Ambulatory Visit: Payer: Managed Care, Other (non HMO) | Attending: Anesthesiology | Admitting: Anesthesiology

## 2021-04-28 ENCOUNTER — Other Ambulatory Visit: Payer: Self-pay

## 2021-04-28 ENCOUNTER — Encounter: Payer: Self-pay | Admitting: Anesthesiology

## 2021-04-28 DIAGNOSIS — M545 Low back pain, unspecified: Secondary | ICD-10-CM | POA: Diagnosis not present

## 2021-04-28 DIAGNOSIS — M5136 Other intervertebral disc degeneration, lumbar region: Secondary | ICD-10-CM | POA: Diagnosis not present

## 2021-04-28 DIAGNOSIS — F1190 Opioid use, unspecified, uncomplicated: Secondary | ICD-10-CM | POA: Diagnosis not present

## 2021-04-28 DIAGNOSIS — G8929 Other chronic pain: Secondary | ICD-10-CM

## 2021-04-28 DIAGNOSIS — G894 Chronic pain syndrome: Secondary | ICD-10-CM

## 2021-04-28 DIAGNOSIS — M47816 Spondylosis without myelopathy or radiculopathy, lumbar region: Secondary | ICD-10-CM

## 2021-04-28 DIAGNOSIS — M51369 Other intervertebral disc degeneration, lumbar region without mention of lumbar back pain or lower extremity pain: Secondary | ICD-10-CM

## 2021-04-28 DIAGNOSIS — M25551 Pain in right hip: Secondary | ICD-10-CM

## 2021-04-28 HISTORY — DX: Other chronic pain: G89.29

## 2021-04-28 HISTORY — DX: Pain in right hip: M25.551

## 2021-04-28 MED ORDER — FENTANYL 12 MCG/HR TD PT72: 1.0000 | MEDICATED_PATCH | TRANSDERMAL | 0 refills | Status: AC

## 2021-04-28 MED ORDER — HYDROCODONE-ACETAMINOPHEN 10-325 MG PO TABS: 1.0000 | ORAL_TABLET | Freq: Every day | ORAL | 0 refills | Status: AC | PRN

## 2021-04-28 MED ORDER — HYDROCODONE-ACETAMINOPHEN 10-325 MG PO TABS: 1.0000 | ORAL_TABLET | Freq: Every day | ORAL | 0 refills | Status: DC | PRN

## 2021-04-28 MED ORDER — FENTANYL 12 MCG/HR TD PT72: 1.0000 | MEDICATED_PATCH | TRANSDERMAL | 0 refills | Status: DC

## 2021-04-28 NOTE — Progress Notes (Signed)
Virtual Visit via Telephone Note  I connected with Carolyn Esparza on 04/28/21 at  2:10 PM EDT by telephone and verified that I am speaking with the correct person using two identifiers.  Location: Patient: Home Provider: Pain control center   I discussed the limitations, risks, security and privacy concerns of performing an evaluation and management service by telephone and the availability of in person appointments. I also discussed with the patient that there may be a patient responsible charge related to this service. The patient expressed understanding and agreed to proceed.   History of Present Illness: I spoke with Carolyn Esparza today via telephone as we were unable to link for the video portion of virtual, but he reports that she is doing well with her current pain regimen.  She takes the Duragesic patch approximately every 3 days with one of the hydrocodone's for breakthrough pain as needed.  She averages about every other day on that.  She uses these based on severity pain and the quality characteristic and distribution of the pain has been stable nature no recent changes reported.  Otherwise she is in her usual state of health.  She is having a considerable amount of right hip pain and this is currently scheduled to be evaluated with an MRI with surgical evaluation additionally.  She is recently seen Dr. Richardine Service for this.  Otherwise no other changes are report in her lower extremity strength or function.Review of systems:  General: No fevers or chills Pulmonary: No shortness of breath or dyspnea Cardiac: No angina or palpitations or lightheadedness GI: No abdominal pain or constipation Psych: No depression Observations/Objective:   Current Outpatient Medications:    [START ON 05/31/2021] fentaNYL (DURAGESIC) 12 MCG/HR, Place 1 patch onto the skin every 3 (three) days., Disp: 10 patch, Rfl: 0   [START ON 05/31/2021] HYDROcodone-acetaminophen (NORCO) 10-325 MG tablet, Take 1 tablet by mouth  daily as needed for moderate pain or severe pain., Disp: 15 tablet, Rfl: 0   atorvastatin (LIPITOR) 10 MG tablet, TAKE 1 TABLET(10 MG) BY MOUTH DAILY AT NIGHT, Disp: 90 tablet, Rfl: 1   buPROPion (WELLBUTRIN XL) 300 MG 24 hr tablet, TAKE 1 TABLET(300 MG) BY MOUTH DAILY, Disp: 90 tablet, Rfl: 3   celecoxib (CELEBREX) 200 MG capsule, TAKE 1 CAPSULE(200 MG) BY MOUTH DAILY, Disp: 90 capsule, Rfl: 1   estrogen-methylTESTOSTERone 0.625-1.25 MG tablet, Take 1 tablet by mouth daily., Disp: 30 tablet, Rfl: 1   [START ON 04/30/2021] fentaNYL (DURAGESIC) 12 MCG/HR, Place 1 patch onto the skin every 3 (three) days., Disp: 10 patch, Rfl: 0   [START ON 04/30/2021] HYDROcodone-acetaminophen (NORCO) 10-325 MG tablet, Take 1 tablet by mouth daily as needed for severe pain., Disp: 15 tablet, Rfl: 0   lidocaine (LIDODERM) 5 %, Place 2 patches onto the skin daily. , Disp: , Rfl:    Medium Chain Triglycerides (MCT OIL PO), Take by mouth., Disp: , Rfl:    Multiple Vitamin (MULTI-VITAMINS) TABS, Take 1 tablet by mouth daily. , Disp: , Rfl:    Omega-3 Fatty Acids (FISH OIL PO), Take 1 capsule by mouth daily. , Disp: , Rfl:    omeprazole (PRILOSEC) 20 MG capsule, Take 1 capsule (20 mg total) by mouth daily. In am before food 30 minutes, Disp: 90 capsule, Rfl: 3   promethazine (PHENERGAN) 25 MG tablet, Take 1 tablet (25 mg total) by mouth 2 (two) times daily as needed for nausea or vomiting., Disp: 30 tablet, Rfl: 2   promethazine (PHENERGAN) 25 MG tablet,  Take 1 tablet (25 mg total) by mouth daily as needed for nausea or vomiting., Disp: 30 tablet, Rfl: 5   tiZANidine (ZANAFLEX) 4 MG capsule, Take 1 capsule (4 mg total) by mouth at bedtime as needed for muscle spasms., Disp: 30 capsule, Rfl: 5   TRINTELLIX 5 MG TABS tablet, Take 5 mg by mouth 2 (two) times a week. (Patient not taking: Reported on 12/13/2020), Disp: , Rfl:    valACYclovir (VALTREX) 1000 MG tablet, 2 pills day 1 then bid X 3-7 days prn, Disp: 60 tablet, Rfl: 11    Assessment and Plan: 1. Chronic pain syndrome   2. Chronic bilateral low back pain without sciatica   3. DDD (degenerative disc disease), lumbar   4. Chronic, continuous use of opioids   5. Spondylosis of lumbar region without myelopathy or radiculopathy   6. Facet arthritis of lumbar region   7. Chronic pain of right hip   Based on our discussion today and after review of the [facility] practitioner database information it is appropriate to refill her medicines for the next 2 months is to be dated for October 2 and November 1.  We will schedule her for routine evaluation in 2 months.  No other changes in her pain management regimen will be initiated today.  She is to continue follow-up with her primary care physicians for baseline medical care.  She will feedback to [place] regarding her hip eval and future plans if surgery is indicated.  Follow Up Instructions:    I discussed the assessment and treatment plan with the patient. The patient was provided an opportunity to ask questions and all were answered. The patient agreed with the plan and demonstrated an understanding of the instructions.   The patient was advised to call back or seek an in-person evaluation if the symptoms worsen or if the condition fails to improve as anticipated.  I provided 30 minutes of non-face-to-face time during this encounter.   Molli Barrows, MD

## 2021-04-29 ENCOUNTER — Encounter: Payer: Self-pay | Admitting: Internal Medicine

## 2021-05-02 ENCOUNTER — Ambulatory Visit
Admission: RE | Admit: 2021-05-02 | Discharge: 2021-05-02 | Disposition: A | Discharge status: Home / Self Care | Payer: Managed Care, Other (non HMO) | Source: Ambulatory Visit | Attending: Surgery | Admitting: Surgery

## 2021-05-02 ENCOUNTER — Other Ambulatory Visit: Payer: Self-pay

## 2021-05-02 DIAGNOSIS — M7062 Trochanteric bursitis, left hip: Secondary | ICD-10-CM | POA: Insufficient documentation

## 2021-05-02 DIAGNOSIS — S76011D Strain of muscle, fascia and tendon of right hip, subsequent encounter: Secondary | ICD-10-CM | POA: Diagnosis present

## 2021-05-02 DIAGNOSIS — M7061 Trochanteric bursitis, right hip: Secondary | ICD-10-CM | POA: Diagnosis present

## 2021-05-03 NOTE — Telephone Encounter (Signed)
The clips will eventually pass, its not causing any harm. Sometimes it can stay attached longer than anticipated.

## 2021-06-06 ENCOUNTER — Telehealth: Payer: Self-pay

## 2021-06-06 NOTE — Telephone Encounter (Signed)
Patient notified that PA has been submitted.

## 2021-06-06 NOTE — Telephone Encounter (Signed)
Patient states her fent patches will require prior auth.

## 2021-06-27 ENCOUNTER — Other Ambulatory Visit: Payer: Self-pay

## 2021-06-27 ENCOUNTER — Ambulatory Visit: Payer: Managed Care, Other (non HMO) | Attending: Anesthesiology | Admitting: Anesthesiology

## 2021-06-27 ENCOUNTER — Encounter: Payer: Self-pay | Admitting: Anesthesiology

## 2021-06-27 DIAGNOSIS — M5136 Other intervertebral disc degeneration, lumbar region: Secondary | ICD-10-CM

## 2021-06-27 DIAGNOSIS — M545 Low back pain, unspecified: Secondary | ICD-10-CM

## 2021-06-27 DIAGNOSIS — F1190 Opioid use, unspecified, uncomplicated: Secondary | ICD-10-CM

## 2021-06-27 DIAGNOSIS — M25551 Pain in right hip: Secondary | ICD-10-CM

## 2021-06-27 DIAGNOSIS — G894 Chronic pain syndrome: Secondary | ICD-10-CM

## 2021-06-27 DIAGNOSIS — M47816 Spondylosis without myelopathy or radiculopathy, lumbar region: Secondary | ICD-10-CM

## 2021-06-27 DIAGNOSIS — G8929 Other chronic pain: Secondary | ICD-10-CM

## 2021-06-27 DIAGNOSIS — M51369 Other intervertebral disc degeneration, lumbar region without mention of lumbar back pain or lower extremity pain: Secondary | ICD-10-CM

## 2021-06-27 MED ORDER — FENTANYL 12 MCG/HR TD PT72: 1.0000 | MEDICATED_PATCH | TRANSDERMAL | 0 refills | Status: DC

## 2021-06-27 MED ORDER — HYDROCODONE-ACETAMINOPHEN 10-325 MG PO TABS: 1.0000 | ORAL_TABLET | Freq: Every day | ORAL | 0 refills | Status: DC | PRN

## 2021-06-28 ENCOUNTER — Other Ambulatory Visit: Payer: Self-pay | Admitting: Obstetrics and Gynecology

## 2021-06-28 DIAGNOSIS — R6882 Decreased libido: Secondary | ICD-10-CM

## 2021-06-28 DIAGNOSIS — Z79890 Hormone replacement therapy (postmenopausal): Secondary | ICD-10-CM

## 2021-06-28 DIAGNOSIS — N951 Menopausal and female climacteric states: Secondary | ICD-10-CM

## 2021-06-28 NOTE — Telephone Encounter (Signed)
Texted Dr. Andree Elk.

## 2021-06-29 ENCOUNTER — Other Ambulatory Visit: Payer: Self-pay | Admitting: Obstetrics and Gynecology

## 2021-06-29 ENCOUNTER — Encounter: Payer: Self-pay | Admitting: Obstetrics and Gynecology

## 2021-06-29 DIAGNOSIS — R6882 Decreased libido: Secondary | ICD-10-CM

## 2021-06-29 DIAGNOSIS — Z79890 Hormone replacement therapy (postmenopausal): Secondary | ICD-10-CM

## 2021-06-29 DIAGNOSIS — N951 Menopausal and female climacteric states: Secondary | ICD-10-CM

## 2021-06-29 NOTE — Progress Notes (Unsigned)
Rx change back to climara 0.0375 patches for HRT. Preferred over estratest.

## 2021-06-30 ENCOUNTER — Other Ambulatory Visit: Payer: Self-pay | Admitting: Obstetrics and Gynecology

## 2021-06-30 MED ORDER — ESTRADIOL 0.0375 MG/24HR TD PTWK: 0.0375 mg | MEDICATED_PATCH | TRANSDERMAL | 0 refills | Status: DC

## 2021-06-30 NOTE — Progress Notes (Signed)
Rx change to climara patch from estratest. Sent to [facility].

## 2021-07-01 ENCOUNTER — Other Ambulatory Visit: Payer: Self-pay

## 2021-07-01 ENCOUNTER — Ambulatory Visit: Payer: Managed Care, Other (non HMO) | Admitting: Internal Medicine

## 2021-07-01 ENCOUNTER — Encounter: Payer: Self-pay | Admitting: Internal Medicine

## 2021-07-01 VITALS — BP 110/68 | HR 73 | Temp 98.1°F | Ht 63.0 in | Wt 144.0 lb

## 2021-07-01 DIAGNOSIS — R779 Abnormality of plasma protein, unspecified: Secondary | ICD-10-CM | POA: Diagnosis not present

## 2021-07-01 DIAGNOSIS — M545 Low back pain, unspecified: Secondary | ICD-10-CM

## 2021-07-01 DIAGNOSIS — Z0000 Encounter for general adult medical examination without abnormal findings: Secondary | ICD-10-CM | POA: Diagnosis not present

## 2021-07-01 DIAGNOSIS — M542 Cervicalgia: Secondary | ICD-10-CM

## 2021-07-01 DIAGNOSIS — Z23 Encounter for immunization: Secondary | ICD-10-CM | POA: Diagnosis not present

## 2021-07-01 DIAGNOSIS — E785 Hyperlipidemia, unspecified: Secondary | ICD-10-CM

## 2021-07-01 DIAGNOSIS — G8929 Other chronic pain: Secondary | ICD-10-CM

## 2021-07-01 DIAGNOSIS — R110 Nausea: Secondary | ICD-10-CM

## 2021-07-01 DIAGNOSIS — M25551 Pain in right hip: Secondary | ICD-10-CM

## 2021-07-01 DIAGNOSIS — B009 Herpesviral infection, unspecified: Secondary | ICD-10-CM

## 2021-07-01 DIAGNOSIS — Z1231 Encounter for screening mammogram for malignant neoplasm of breast: Secondary | ICD-10-CM

## 2021-07-01 DIAGNOSIS — M25552 Pain in left hip: Secondary | ICD-10-CM

## 2021-07-01 HISTORY — DX: Herpesviral infection, unspecified: B00.9

## 2021-07-01 HISTORY — DX: Low back pain, unspecified: M54.50

## 2021-07-01 HISTORY — DX: Cervicalgia: M54.2

## 2021-07-01 MED ORDER — PNEUMOCOCCAL 13-VAL CONJ VACC IM SUSP: 0.5000 mL | Freq: Once | INTRAMUSCULAR | 0 refills | Status: AC

## 2021-07-01 MED ORDER — CELECOXIB 200 MG PO CAPS: ORAL_CAPSULE | ORAL | 3 refills | Status: DC

## 2021-07-01 MED ORDER — BUPROPION HCL ER (XL) 300 MG PO TB24: ORAL_TABLET | ORAL | 3 refills | Status: DC

## 2021-07-01 MED ORDER — PROMETHAZINE HCL 25 MG PO TABS: 25.0000 mg | ORAL_TABLET | Freq: Every day | ORAL | 5 refills | Status: DC | PRN

## 2021-07-01 MED ORDER — TIZANIDINE HCL 4 MG PO CAPS: 4.0000 mg | ORAL_CAPSULE | Freq: Every evening | ORAL | 5 refills | Status: DC | PRN

## 2021-07-01 MED ORDER — ATORVASTATIN CALCIUM 10 MG PO TABS: ORAL_TABLET | ORAL | 3 refills | Status: DC

## 2021-07-01 MED ORDER — VALACYCLOVIR HCL 1 G PO TABS: ORAL_TABLET | ORAL | 11 refills | Status: AC

## 2021-07-01 NOTE — Progress Notes (Addendum)
Chief Complaint  Patient presents with   Follow-up   Annual  1. Chronic right hip pain f/u ortho and hip pain is lateral  2. Colonoscopy 09/2020 tolerated fentanyl and versed  3. Elevated protein will repeat A/G ratio advised to increase water  4. Ldl increased and rec healthy diet and exercise doing keto diet which I do not rec   Review of Systems  Constitutional:  Negative for weight loss.  HENT:  Negative for hearing loss.   Eyes:  Negative for blurred vision.  Respiratory:  Negative for shortness of breath.   Cardiovascular:  Negative for chest pain.  Gastrointestinal:  Negative for abdominal pain and blood in stool.  Genitourinary:  Negative for dysuria.  Musculoskeletal:  Positive for joint pain. Negative for falls.  Skin:  Negative for rash.  Neurological:  Negative for headaches.  Psychiatric/Behavioral:  Negative for depression.   Past Medical History:  Diagnosis Date   Anxiety    ASCUS with positive high risk HPV cervical    CAD (coronary artery disease)    Chicken pox    Chronic pain    neck, back, b/l hips    Chronic pain of right hip 0/63/0160   Complication of anesthesia    DDD (degenerative disc disease), lumbar 01/06/2021   Depression    Facet arthritis of lumbar region 01/06/2021   GERD (gastroesophageal reflux disease)    History of Crohn's disease    Hyperlipidemia    Left ovarian cyst    s/p removal of 1 ovary ? which one removed per pt    Libido, decreased    Skin cancer    reports skin cancer removed from skin in 2016 or 2017   Thyroid nodule    repeat US resolved and Ct neck 05/2019 normal thyroid    Past Surgical History:  Procedure Laterality Date   ABDOMINAL HYSTERECTOMY     APPENDECTOMY  2004   BLADDER SURGERY     BREAST BIOPSY     x 2   COLONOSCOPY     COLONOSCOPY WITH PROPOFOL N/A 10/25/2020   Procedure: COLONOSCOPY WITH PROPOFOL;  Surgeon: Mauri Pole, MD;  Location: [facility];  Service: Endoscopy;  Laterality: N/A;    HEMOSTASIS CLIP PLACEMENT  10/25/2020   Procedure: HEMOSTASIS CLIP PLACEMENT;  Surgeon: Mauri Pole, MD;  Location: [facility];  Service: Endoscopy;;   OOPHORECTOMY Right    Unilateral Right (per MRI 04/17/2018)   POLYPECTOMY  10/25/2020   Procedure: POLYPECTOMY;  Surgeon: Mauri Pole, MD;  Location: [facility];  Service: Endoscopy;;   SUBMUCOSAL LIFTING INJECTION  10/25/2020   Procedure: SUBMUCOSAL LIFTING INJECTION;  Surgeon: Mauri Pole, MD;  Location: [facility];  Service: Endoscopy;;   WISDOM TOOTH EXTRACTION     Family History  Problem Relation Age of Onset   Alcohol abuse Mother    Hyperlipidemia Mother    Heart disease Mother    Hypertension Mother    Diabetes Mother    Crohn's disease Sister        1/2 sister   Depression Sister    Osteoporosis Sister    Leukemia Paternal Grandmother    Cancer Paternal Aunt        blood, type unknown   Intellectual disability Niece    Breast cancer Neg Hx    Social History   Socioeconomic History   Marital status: Married    Spouse name: douglas   Number of children: 0   Years of education: Not on file  Highest education level: Associate degree: occupational, Hotel manager, or vocational program  Occupational History   Occupation: Public relations account executive    Comment: full time  Tobacco Use   Smoking status: Former    Types: Cigarettes    Quit date: 08/29/2000    Years since quitting: 20.8   Smokeless tobacco: Never  Vaping Use   Vaping Use: Never used  Substance and Sexual Activity   Alcohol use: No    Alcohol/week: 0.0 standard drinks    Comment: social    Drug use: No   Sexual activity: Not Currently    Birth control/protection: Surgical    Comment: Hysterectomy  Other Topics Concern   Not on file  Social History Narrative   Married Doug   Works [employer]    Social Determinants of Radio broadcast assistant Strain: Not on file  Food Insecurity: Not on file  Transportation Needs: Not on file   Physical Activity: Not on file  Stress: Not on file  Social Connections: Not on file  Intimate Partner Violence: Not on file   Current Meds  Medication Sig   estradiol (CLIMARA - DOSED IN MG/24 HR) 0.0375 mg/24hr patch Place 1 patch (0.0375 mg total) onto the skin once a week.   [START ON 07/06/2021] fentaNYL (DURAGESIC) 12 MCG/HR Place 1 patch onto the skin every 3 (three) days.   HYDROcodone-acetaminophen (NORCO) 10-325 MG tablet Take 1 tablet by mouth daily as needed for moderate pain or severe pain.   lidocaine (LIDODERM) 5 % Place 2 patches onto the skin daily.    Medium Chain Triglycerides (MCT OIL PO) Take by mouth.   Multiple Vitamin (MULTI-VITAMINS) TABS Take 1 tablet by mouth daily.    Omega-3 Fatty Acids (FISH OIL PO) Take 1 capsule by mouth daily.    omeprazole (PRILOSEC) 20 MG capsule Take 1 capsule (20 mg total) by mouth daily. In am before food 30 minutes   pneumococcal 13-valent conjugate vaccine (PREVNAR 13) SUSP injection Inject 0.5 mLs into the muscle once for 1 dose.   [DISCONTINUED] atorvastatin (LIPITOR) 10 MG tablet TAKE 1 TABLET(10 MG) BY MOUTH DAILY AT NIGHT   [DISCONTINUED] buPROPion (WELLBUTRIN XL) 300 MG 24 hr tablet TAKE 1 TABLET(300 MG) BY MOUTH DAILY   [DISCONTINUED] celecoxib (CELEBREX) 200 MG capsule TAKE 1 CAPSULE(200 MG) BY MOUTH DAILY   [DISCONTINUED] promethazine (PHENERGAN) 25 MG tablet Take 1 tablet (25 mg total) by mouth 2 (two) times daily as needed for nausea or vomiting.   [DISCONTINUED] promethazine (PHENERGAN) 25 MG tablet Take 1 tablet (25 mg total) by mouth daily as needed for nausea or vomiting.   [DISCONTINUED] tiZANidine (ZANAFLEX) 4 MG capsule Take 1 capsule (4 mg total) by mouth at bedtime as needed for muscle spasms.   [DISCONTINUED] valACYclovir (VALTREX) 1000 MG tablet 2 pills day 1 then bid X 3-7 days prn   Allergies  Allergen Reactions   Propofol Palpitations    BP drops and EKG changes, bladder retention   Nsaids Other (See  Comments)    Flare of Crohn's.   Pentasa [Mesalamine] Other (See Comments)    Chest pain.   Recent Results (from the past 2160 hour(s))  Comprehensive metabolic panel     Status: Abnormal   Collection Time: 04/12/21  7:33 AM  Result Value Ref Range   Glucose 87 65 - 99 mg/dL   BUN 19 6 - 24 mg/dL   Creatinine, Ser 0.95 0.57 - 1.00 mg/dL   eGFR 71 >59 mL/min/1.73   BUN/Creatinine  Ratio 20 9 - 23   Sodium 143 134 - 144 mmol/L   Potassium 3.9 3.5 - 5.2 mmol/L   Chloride 103 96 - 106 mmol/L   CO2 23 20 - 29 mmol/L   Calcium 9.2 8.7 - 10.2 mg/dL   Total Protein 6.4 6.0 - 8.5 g/dL   Albumin 4.5 3.8 - 4.9 g/dL   Globulin, Total 1.9 1.5 - 4.5 g/dL   Albumin/Globulin Ratio 2.4 (H) 1.2 - 2.2   Bilirubin Total 0.2 0.0 - 1.2 mg/dL   Alkaline Phosphatase 51 44 - 121 IU/L   AST 26 0 - 40 IU/L   ALT 17 0 - 32 IU/L  Lipid panel     Status: Abnormal   Collection Time: 04/12/21  7:33 AM  Result Value Ref Range   Cholesterol, Total 182 100 - 199 mg/dL   Triglycerides 122 0 - 149 mg/dL   HDL 47 >39 mg/dL   VLDL Cholesterol Cal 22 5 - 40 mg/dL   LDL Chol Calc (NIH) 113 (H) 0 - 99 mg/dL   Chol/HDL Ratio 3.9 0.0 - 4.4 ratio    Comment:                                   T. Chol/HDL Ratio                                             Men  Women                               1/2 Avg.Risk  3.4    3.3                                   Avg.Risk  5.0    4.4                                2X Avg.Risk  9.6    7.1                                3X Avg.Risk 23.4   11.0   TSH     Status: None   Collection Time: 04/12/21  7:33 AM  Result Value Ref Range   TSH 3.360 0.450 - 4.500 uIU/mL  Urinalysis, Routine w reflex microscopic     Status: None   Collection Time: 04/12/21  7:33 AM  Result Value Ref Range   Specific Gravity, UA 1.008 1.005 - 1.030   pH, UA 6.5 5.0 - 7.5   Color, UA Yellow Yellow   Appearance Ur Clear Clear   Leukocytes,UA Negative Negative   Protein,UA Negative Negative/Trace    Glucose, UA Negative Negative   Ketones, UA Negative Negative   RBC, UA Negative Negative   Bilirubin, UA Negative Negative   Urobilinogen, Ur 0.2 0.2 - 1.0 mg/dL   Nitrite, UA Negative Negative   Microscopic Examination Comment     Comment: Microscopic not indicated and not performed.  CBC with Differential/Platelet     Status: None   Collection Time: 04/12/21  7:33  AM  Result Value Ref Range   WBC 5.8 3.4 - 10.8 x10E3/uL   RBC 4.33 3.77 - 5.28 x10E6/uL   Hemoglobin 13.6 11.1 - 15.9 g/dL   Hematocrit 41.1 34.0 - 46.6 %   MCV 95 79 - 97 fL   MCH 31.4 26.6 - 33.0 pg   MCHC 33.1 31.5 - 35.7 g/dL   RDW 12.3 11.7 - 15.4 %   Platelets 305 150 - 450 x10E3/uL   Neutrophils 59 Not Estab. %   Lymphs 30 Not Estab. %   Monocytes 8 Not Estab. %   Eos 2 Not Estab. %   Basos 0 Not Estab. %   Neutrophils Absolute 3.4 1.4 - 7.0 x10E3/uL   Lymphocytes Absolute 1.7 0.7 - 3.1 x10E3/uL   Monocytes Absolute 0.5 0.1 - 0.9 x10E3/uL   EOS (ABSOLUTE) 0.1 0.0 - 0.4 x10E3/uL   Basophils Absolute 0.0 0.0 - 0.2 x10E3/uL   Immature Granulocytes 1 Not Estab. %   Immature Grans (Abs) 0.0 0.0 - 0.1 x10E3/uL  Hemoglobin A1c     Status: None   Collection Time: 04/12/21  7:38 AM  Result Value Ref Range   Hgb A1c MFr Bld 5.2 4.8 - 5.6 %    Comment:          Prediabetes: 5.7 - 6.4          Diabetes: >6.4          Glycemic control for adults with diabetes: <7.0    Est. average glucose Bld gHb Est-mCnc 103 mg/dL   Objective  Body mass index is 25.51 kg/m. Wt Readings from Last 3 Encounters:  07/01/21 144 lb (65.3 kg)  01/06/21 143 lb (64.9 kg)  11/30/20 148 lb 12.8 oz (67.5 kg)   Temp Readings from Last 3 Encounters:  07/01/21 98.1 F (36.7 C) (Oral)  01/06/21 (!) 97.4 F (36.3 C)  11/30/20 97.6 F (36.4 C) (Oral)   BP Readings from Last 3 Encounters:  07/01/21 110/68  01/06/21 (!) 129/96  11/30/20 104/72   Pulse Readings from Last 3 Encounters:  07/01/21 73  01/06/21 83  11/30/20 95     Physical Exam Vitals and nursing note reviewed.  Constitutional:      Appearance: Normal appearance. She is well-developed and well-groomed.  HENT:     Head: Normocephalic and atraumatic.  Eyes:     Conjunctiva/sclera: Conjunctivae normal.     Pupils: Pupils are equal, round, and reactive to light.  Cardiovascular:     Rate and Rhythm: Normal rate and regular rhythm.     Heart sounds: Normal heart sounds. No murmur heard. Pulmonary:     Effort: Pulmonary effort is normal.     Breath sounds: Normal breath sounds.  Abdominal:     General: Abdomen is flat. Bowel sounds are normal.     Tenderness: There is no abdominal tenderness.  Musculoskeletal:        General: No tenderness.  Skin:    General: Skin is warm and dry.  Neurological:     General: No focal deficit present.     Mental Status: She is alert and oriented to person, place, and time. Mental status is at baseline.     Cranial Nerves: Cranial nerves 2-12 are intact.     Gait: Gait is intact.  Psychiatric:        Attention and Perception: Attention and perception normal.        Mood and Affect: Mood and affect normal.  Speech: Speech normal.        Behavior: Behavior normal. Behavior is cooperative.        Thought Content: Thought content normal.        Cognition and Memory: Cognition and memory normal.        Judgment: Judgment normal.    Assessment  Plan  Annual physical exam See below   Chronic right hip pain f/u [facility]  Elevated blood protein - Plan: Comprehensive metabolic panel  Hyperlipidemia, unspecified hyperlipidemia type - Plan: atorvastatin (LIPITOR) 10 MG tablet  Cervicalgia - Plan: celecoxib (CELEBREX) 200 MG capsule, tiZANidine (ZANAFLEX) 4 MG capsule  Pain of both hip joints - Plan: celecoxib (CELEBREX) 200 MG capsule, tiZANidine (ZANAFLEX) 4 MG capsule  Chronic nausea - Plan: promethazine (PHENERGAN) 25 MG tablet  Low back pain, unspecified back pain laterality, unspecified  chronicity, unspecified whether sciatica present - Plan: tiZANidine (ZANAFLEX) 4 MG capsule  Herpes infection - Plan: valACYclovir (VALTREX) 1000 MG tablet   HM Given labs [facility] to be done fasting as of 04/16/21  Flu shot utd 2022 [place]  Will get prevnar 13 07/15/21 hep B 3/3 vaccines had immune shingrix had 2/2  4/4 moderna covid 19 vaccines  utd Tdap  rec MMR   Sp partial hysterectomy. H/o Pap ASCUS 07/19/17 f/u with Dr. Kenton Kingfisher pap 07/22/18 negative except yeast now with [facility]  Pap 08/67/61 normal except yeast 09/01/20 neg neg HPV   Colonoscopy pt prev wants to hold as disc prev visits will keep encouraging given h/o Crohns per hx, FH polpys  -cant tolerate proprofol but tolerate fenatyl and versed Dr. Silverio Decamp [facility] 10/25/20 serrated polyps repeat 3 years   [facility]ammogram [facility] [place] repeat from 08/11/19 to 08/21/2019 normal    Ordered for 2022 mammogram 11/25/20 neg   Dermatology saw recently Dr. Kellie Moor 08/2019 normal no bx's; no bxs in 2022  Sch 1/ or 08/2021    DEXA 01/28/19 normal    Former smoker quit in 2002    Thyroid nodule noted on problem list she did have in the past but resolved after repeat thyroid US and CT scan 05/06/19 negative thyroid nodules    Rec healthy diet and exercise   Specialists:  [facility]. [facility]  [facility] ortho Cards Dr. Ubaldo Glassing GI in past [place]>le[facility] GI Dr. Burna Mortimer as of 9/50/93 Ob/gyn [facility]  pulm [facility] osa  Provider: Dr. Olivia Mackie [name]-Internal Medicine

## 2021-07-01 NOTE — Patient Instructions (Addendum)
Carolyn Esparza Carolyn Kitten, MD-2nd opinion ? Orthopedic surgeon in [place], [place]: [email or web address] Get online care: [email or web address] Address: [address] [place], [place] [address] Phone: [phone]   Pneumococcal Conjugate Vaccine (Prevnar 13) Suspension for Injection What is this medication? PNEUMOCOCCAL VACCINE (NEU mo KOK al vak SEEN) is a vaccine used to prevent pneumococcus bacterial infections. These bacteria can cause serious infections like pneumonia, meningitis, and blood infections. This vaccine will lower your chance of getting pneumonia. If you do get pneumonia, it can make your symptoms milder and your illness shorter. This vaccine will not treat an infection and will not cause infection. This vaccine is recommended for infants and young children, adults with certain medical conditions, and adults 67 years or older. This medicine may be used for other purposes; ask your health care provider or pharmacist if you have questions. COMMON BRAND NAME(S): Prevnar, Prevnar 13 What should I tell my care team before I take this medication? They need to know if you have any of these conditions: bleeding problems fever immune system problems an unusual or allergic reaction to pneumococcal vaccine, diphtheria toxoid, other vaccines, latex, other medicines, foods, dyes, or preservatives pregnant or trying to get pregnant breast-feeding How should I use this medication? This vaccine is for injection into a muscle. It is given by a health care professional. A copy of Vaccine Information Statements will be given before each vaccination. Read this sheet carefully each time. The sheet may change frequently. Talk to your pediatrician regarding the use of this medicine in children. While this drug may be prescribed for children as young as 51 weeks old for selected conditions, precautions do apply. Overdosage: If you think you have taken too much of this medicine contact a poison control  center or emergency room at once. NOTE: This medicine is only for you. Do not share this medicine with others. What if I miss a dose? It is important not to miss your dose. Call your doctor or health care professional if you are unable to keep an appointment. What may interact with this medication? medicines for cancer chemotherapy medicines that suppress your immune function steroid medicines like prednisone or cortisone This list may not describe all possible interactions. Give your health care provider a list of all the medicines, herbs, non-prescription drugs, or dietary supplements you use. Also tell them if you smoke, drink alcohol, or use illegal drugs. Some items may interact with your medicine. What should I watch for while using this medication? Mild fever and pain should go away in 3 days or less. Report any unusual symptoms to your doctor or health care professional. What side effects may I notice from receiving this medication? Side effects that you should report to your doctor or health care professional as soon as possible: allergic reactions like skin rash, itching or hives, swelling of the face, lips, or tongue breathing problems confused fast or irregular heartbeat fever over 102 degrees F seizures unusual bleeding or bruising unusual muscle weakness Side effects that usually do not require medical attention (report to your doctor or health care professional if they continue or are bothersome): aches and pains diarrhea fever of 102 degrees F or less headache irritable loss of appetite pain, tender at site where injected trouble sleeping This list may not describe all possible side effects. Call your doctor for medical advice about side effects. You may report side effects to FDA at 1-800-FDA-1088. Where should I keep my medication? This does not apply. This vaccine is  given in a clinic, pharmacy, doctor's office, or other health care setting and will not be stored at  home. NOTE: This sheet is a summary. It may not cover all possible information. If you have questions about this medicine, talk to your doctor, pharmacist, or health care provider.  2022 Elsevier/Gold Standard (2014-04-23 00:00:00)

## 2021-07-04 ENCOUNTER — Encounter: Payer: Self-pay | Admitting: *Deleted

## 2021-07-04 ENCOUNTER — Telehealth: Payer: Self-pay | Admitting: *Deleted

## 2021-07-04 ENCOUNTER — Other Ambulatory Visit: Payer: Self-pay | Admitting: Internal Medicine

## 2021-07-04 MED ORDER — HYDROCODONE-ACETAMINOPHEN 10-325 MG PO TABS: 1.0000 | ORAL_TABLET | Freq: Every day | ORAL | 0 refills | Status: DC | PRN

## 2021-07-04 MED ORDER — FENTANYL 12 MCG/HR TD PT72
1.0000 | MEDICATED_PATCH | TRANSDERMAL | 0 refills | Status: DC
Start: 2021-07-06 — End: 2021-07-14

## 2021-07-04 NOTE — Progress Notes (Signed)
Virtual Visit via Telephone Note  I connected with Carolyn Esparza on 07/04/21 at  2:15 PM EST by telephone and verified that I am speaking with the correct person using two identifiers.  Location: Patient: Home Provider: Pain control symptoms   I discussed the limitations, risks, security and privacy concerns of performing an evaluation and management service by telephone and the availability of in person appointments. I also discussed with the patient that there may be a patient responsible charge related to this service. The patient expressed understanding and agreed to proceed.   History of Present Illness: I spoke with Carolyn Esparza via telephone as we were unable to link for the video portion of virtual conference and she reports thatShe has been doing reasonably well but is having a fair amount of breakthrough pain.  Primary impetus for her pain is her low back and right hip.  This is currently under evaluation.  She may be needing some interventional therapy for the right hip pain she reports.  No change in bowel or bladder function is noted.  Her lower extremity strength and function is also at baseline.  She takes her Duragesic patch as prescribed and this continues to work well for her.  She does use once a day or every other day Norco which does work but with the additional pain she is having more frequent breakthrough.  Otherwise she is tolerating the medications well with no side effects reported and gets good relief.  The quality characteristic and distribution of her pain syndrome has stable in nature.  Review of systems: General: No fevers or chills Pulmonary: No shortness of breath or dyspnea Cardiac: No angina or palpitations or lightheadedness GI: No abdominal pain or constipation Psych: No depression    Observations/Objective:  Current Outpatient Medications:    atorvastatin (LIPITOR) 10 MG tablet, TAKE 1 TABLET(10 MG) BY MOUTH DAILY AT NIGHT, Disp: 90 tablet, Rfl: 3   buPROPion  (WELLBUTRIN XL) 300 MG 24 hr tablet, TAKE 1 TABLET(300 MG) BY MOUTH DAILY, Disp: 90 tablet, Rfl: 3   celecoxib (CELEBREX) 200 MG capsule, TAKE 1 CAPSULE(200 MG) BY MOUTH DAILY, Disp: 90 capsule, Rfl: 3   estradiol (CLIMARA - DOSED IN MG/24 HR) 0.0375 mg/24hr patch, Place 1 patch (0.0375 mg total) onto the skin once a week., Disp: 12 patch, Rfl: 0   [START ON 07/06/2021] fentaNYL (DURAGESIC) 12 MCG/HR, Place 1 patch onto the skin every 3 (three) days., Disp: 10 patch, Rfl: 0   HYDROcodone-acetaminophen (NORCO) 10-325 MG tablet, Take 1 tablet by mouth daily as needed for moderate pain or severe pain., Disp: 15 tablet, Rfl: 0   lidocaine (LIDODERM) 5 %, Place 2 patches onto the skin daily. , Disp: , Rfl:    Medium Chain Triglycerides (MCT OIL PO), Take by mouth., Disp: , Rfl:    Multiple Vitamin (MULTI-VITAMINS) TABS, Take 1 tablet by mouth daily. , Disp: , Rfl:    Omega-3 Fatty Acids (FISH OIL PO), Take 1 capsule by mouth daily. , Disp: , Rfl:    omeprazole (PRILOSEC) 20 MG capsule, Take 1 capsule (20 mg total) by mouth daily. In am before food 30 minutes, Disp: 90 capsule, Rfl: 3   promethazine (PHENERGAN) 25 MG tablet, Take 1 tablet (25 mg total) by mouth daily as needed for nausea or vomiting., Disp: 30 tablet, Rfl: 5   tiZANidine (ZANAFLEX) 4 MG capsule, Take 1 capsule (4 mg total) by mouth at bedtime as needed for muscle spasms., Disp: 30 capsule, Rfl: 5  valACYclovir (VALTREX) 1000 MG tablet, 2 pills day 1 then bid X 3-7 days prn, Disp: 60 tablet, Rfl: 11   Assessment and Plan:  1. Chronic pain syndrome   2. Chronic bilateral low back pain without sciatica   3. DDD (degenerative disc disease), lumbar   4. Spondylosis of lumbar region without myelopathy or radiculopathy   5. Chronic, continuous use of opioids   6. Facet arthritis of lumbar region   7. Chronic pain of right hip   Based on our discussion today we are going to keep her on her baseline Duragesic.  We may need to reevaluate the  frequency of her short acting pain medications but she has been on this opioid therapy chronically and had no side effects with good relief reported.  She failed conservative therapy previously.  Otherwise I want her to continue follow-up with her orthopedic doctors and continue with her stretching strengthening exercises as reviewed today with return to clinic scheduled at this point for 1 month.  At that stage we may consider some alternatives for her current medication regimen.  Previously she has failed interventional therapy she reports.  Continue follow-up with her primary care physicians for baseline medical care. Follow Up Instructions:    I discussed the assessment and treatment plan with the patient. The patient was provided an opportunity to ask questions and all were answered. The patient agreed with the plan and demonstrated an understanding of the instructions.   The patient was advised to call back or seek an in-person evaluation if the symptoms worsen or if the condition fails to improve as anticipated.  I provided 30 minutes of non-face-to-face time during this encounter.   Molli Barrows, MD

## 2021-07-04 NOTE — Telephone Encounter (Signed)
[facility] called, cancelled outstanding scripts for Hydrocodone and Fentanyl patch.

## 2021-07-04 NOTE — Addendum Note (Signed)
Addended by: Molli Barrows on: 07/04/2021 03:18 PM   Modules accepted: Orders

## 2021-07-12 ENCOUNTER — Ambulatory Visit: Payer: Managed Care, Other (non HMO) | Admitting: Anesthesiology

## 2021-07-12 ENCOUNTER — Other Ambulatory Visit: Payer: Self-pay

## 2021-07-12 ENCOUNTER — Telehealth: Payer: Self-pay | Admitting: Internal Medicine

## 2021-07-12 NOTE — Telephone Encounter (Signed)
Pt called in requesting to get her pneumonia injection here at the office. Pt stated that she have a prescription from doctor  Order is not in system. Pt is schedule for nurse visit on 07/15/21 @ 10:15am.

## 2021-07-13 ENCOUNTER — Encounter: Payer: Self-pay | Admitting: Anesthesiology

## 2021-07-13 NOTE — Telephone Encounter (Signed)
Per note 07/01/21 appointment Patient given rx for Prevnar 13. Updated appointment note to show this.   For your information, Patient coming in 07/15/21 for Prevnar 13

## 2021-07-13 NOTE — Telephone Encounter (Signed)
Ok with prevnar 13 07/15/21

## 2021-07-14 ENCOUNTER — Ambulatory Visit: Payer: Managed Care, Other (non HMO) | Attending: Anesthesiology | Admitting: Anesthesiology

## 2021-07-14 ENCOUNTER — Encounter: Payer: Self-pay | Admitting: Anesthesiology

## 2021-07-14 ENCOUNTER — Other Ambulatory Visit: Payer: Self-pay

## 2021-07-14 DIAGNOSIS — M5136 Other intervertebral disc degeneration, lumbar region: Secondary | ICD-10-CM

## 2021-07-14 DIAGNOSIS — M25551 Pain in right hip: Secondary | ICD-10-CM

## 2021-07-14 DIAGNOSIS — M545 Low back pain, unspecified: Secondary | ICD-10-CM

## 2021-07-14 DIAGNOSIS — G8929 Other chronic pain: Secondary | ICD-10-CM

## 2021-07-14 DIAGNOSIS — M47816 Spondylosis without myelopathy or radiculopathy, lumbar region: Secondary | ICD-10-CM

## 2021-07-14 DIAGNOSIS — G894 Chronic pain syndrome: Secondary | ICD-10-CM | POA: Diagnosis not present

## 2021-07-14 DIAGNOSIS — F1190 Opioid use, unspecified, uncomplicated: Secondary | ICD-10-CM

## 2021-07-14 MED ORDER — HYDROCODONE-ACETAMINOPHEN 10-325 MG PO TABS: 1.0000 | ORAL_TABLET | Freq: Two times a day (BID) | ORAL | 0 refills | Status: DC

## 2021-07-14 MED ORDER — FENTANYL 12 MCG/HR TD PT72
1.0000 | MEDICATED_PATCH | TRANSDERMAL | 0 refills | Status: DC
Start: 2021-08-05 — End: 2021-08-03

## 2021-07-14 NOTE — Progress Notes (Unsigned)
Safety precautions to be maintained throughout the outpatient stay will include: orient to surroundings, keep bed in low position, maintain call bell within reach at all times, provide assistance with transfer out of bed and ambulation.  

## 2021-07-14 NOTE — Progress Notes (Signed)
Virtual Visit via Telephone Note  I connected with Carolyn Esparza on 07/14/21 at  4:25 PM EST by telephone and verified that I am speaking with the correct person using two identifiers.  Location: Patient: Home Provider: Pain control center   I discussed the limitations, risks, security and privacy concerns of performing an evaluation and management service by telephone and the availability of in person appointments. I also discussed with the patient that there may be a patient responsible charge related to this service. The patient expressed understanding and agreed to proceed.   History of Present Illness: I spoke with Carolyn Esparza today via telephone as we were unable to link for the video portion of the conference.  Unfortunately she continues to have severe and incapacitating right hip pain with a surgery scheduled but this cannot be accomplished until sometime in the early new year.  Despite the Duragesic patch 12.5 mics per day and her hydrocodone every other day at the 10 mg strength she has significant breakthrough pain in between.  She gets some relief with the hydrocodone but this relief has been insufficient considering the severity of her back and hip pain.  She is asking for an adjustment in frequency if possible.  In the past she has been on the Duragesic at the 25 mic strength and had difficulty reducing this to the 12 and desires to not change that baseline strength if possible.  She has no side effects with the medications and unfortunately has failed more conservative therapy additionally.  The hydrocodone works well for her and she denies any side effects with that medication gets good relief.  She denies any diverting or illicit use.  The quality characteristic and distribution of her low back pain and hip pain are stable with no change.  No change in lower extremity strength or function or bowel or bladder function is mentioned at this time.  Review of systems: General: No fevers  or chills Pulmonary: No shortness of breath or dyspnea Cardiac: No angina or palpitations or lightheadedness GI: No abdominal pain or constipation Psych: No depression   Observations/Objective:  Current Outpatient Medications:    atorvastatin (LIPITOR) 10 MG tablet, TAKE 1 TABLET(10 MG) BY MOUTH DAILY AT NIGHT, Disp: 90 tablet, Rfl: 3   buPROPion (WELLBUTRIN XL) 300 MG 24 hr tablet, TAKE 1 TABLET(300 MG) BY MOUTH DAILY, Disp: 90 tablet, Rfl: 3   celecoxib (CELEBREX) 200 MG capsule, TAKE 1 CAPSULE(200 MG) BY MOUTH DAILY, Disp: 90 capsule, Rfl: 3   estradiol (CLIMARA - DOSED IN MG/24 HR) 0.0375 mg/24hr patch, Place 1 patch (0.0375 mg total) onto the skin once a week., Disp: 12 patch, Rfl: 0   [START ON 08/05/2021] fentaNYL (DURAGESIC) 12 MCG/HR, Place 1 patch onto the skin every 3 (three) days., Disp: 10 patch, Rfl: 0   [START ON 07/19/2021] HYDROcodone-acetaminophen (NORCO) 10-325 MG tablet, Take 1 tablet by mouth 2 (two) times daily., Disp: 60 tablet, Rfl: 0   lidocaine (LIDODERM) 5 %, Place 2 patches onto the skin daily. , Disp: , Rfl:    Medium Chain Triglycerides (MCT OIL PO), Take by mouth., Disp: , Rfl:    Multiple Vitamin (MULTI-VITAMINS) TABS, Take 1 tablet by mouth daily. , Disp: , Rfl:    Omega-3 Fatty Acids (FISH OIL PO), Take 1 capsule by mouth daily. , Disp: , Rfl:    omeprazole (PRILOSEC) 20 MG capsule, Take 1 capsule (20 mg total) by mouth daily. In am before food 30 minutes, Disp: 90 capsule, Rfl:  3   promethazine (PHENERGAN) 25 MG tablet, Take 1 tablet (25 mg total) by mouth daily as needed for nausea or vomiting., Disp: 30 tablet, Rfl: 5   tiZANidine (ZANAFLEX) 4 MG capsule, Take 1 capsule (4 mg total) by mouth at bedtime as needed for muscle spasms., Disp: 30 capsule, Rfl: 5   tiZANidine (ZANAFLEX) 4 MG tablet, TAKE 1 TABLET BY MOUTH EVERY NIGHT AT BEDTIME AS NEEDED FOR MUSCLE SPASMS., Disp: 30 tablet, Rfl: 1   valACYclovir (VALTREX) 1000 MG tablet, 2 pills day 1 then bid X 3-7  days prn, Disp: 60 tablet, Rfl: 11   Assessment and Plan: 1. Chronic pain syndrome   2. Chronic bilateral low back pain without sciatica   3. DDD (degenerative disc disease), lumbar   4. Spondylosis of lumbar region without myelopathy or radiculopathy   5. Chronic, continuous use of opioids   6. Facet arthritis of lumbar region   7. Chronic pain of right hip   Based on her discussion today and after reviewing the [facility] practitioner database information it is appropriate to make some adjustments in her medication.  She strongly desires not to increase the Duragesic patch so we will keep that at the 12.mic strength with a next refill on January 6 and I am going to have her increase her hydrocodone usage to twice a day dosing with a new refill on December 20.  This will be for 60 tablets with an expected return to clinic in 1 month to reconvene.  Should she have any questions regarding management or problems with her medications she is instructed to contact us for consultation and as always continue follow-up with her primary care physicians for baseline medical care.  Hopefully she will be able to have her hip surgery early in the new year to assist with her pain management.  Follow Up Instructions:    I discussed the assessment and treatment plan with the patient. The patient was provided an opportunity to ask questions and all were answered. The patient agreed with the plan and demonstrated an understanding of the instructions.   The patient was advised to call back or seek an in-person evaluation if the symptoms worsen or if the condition fails to improve as anticipated.  I provided 30 minutes of non-face-to-face time during this encounter.   Molli Barrows, MD

## 2021-07-15 ENCOUNTER — Ambulatory Visit (INDEPENDENT_AMBULATORY_CARE_PROVIDER_SITE_OTHER): Payer: Managed Care, Other (non HMO)

## 2021-07-15 ENCOUNTER — Encounter: Payer: Self-pay | Admitting: Internal Medicine

## 2021-07-15 ENCOUNTER — Other Ambulatory Visit: Payer: Self-pay

## 2021-07-15 DIAGNOSIS — Z23 Encounter for immunization: Secondary | ICD-10-CM | POA: Diagnosis not present

## 2021-07-15 NOTE — Telephone Encounter (Signed)
For your information  

## 2021-07-15 NOTE — Progress Notes (Signed)
Patient presented for Prevnar 13 injection to left deltoid, patient voiced no concerns nor showed any signs of distress during injection.

## 2021-07-16 LAB — COMPREHENSIVE METABOLIC PANEL
ALT: 20 IU/L (ref 0–32)
AST: 22 IU/L (ref 0–40)
Albumin/Globulin Ratio: 2.1 (ref 1.2–2.2)
Albumin: 4.8 g/dL (ref 3.8–4.9)
Alkaline Phosphatase: 68 IU/L (ref 44–121)
BUN/Creatinine Ratio: 19 (ref 9–23)
BUN: 16 mg/dL (ref 6–24)
Bilirubin Total: 0.3 mg/dL (ref 0.0–1.2)
CO2: 23 mmol/L (ref 20–29)
Calcium: 9.4 mg/dL (ref 8.7–10.2)
Chloride: 99 mmol/L (ref 96–106)
Creatinine, Ser: 0.84 mg/dL (ref 0.57–1.00)
Globulin, Total: 2.3 g/dL (ref 1.5–4.5)
Glucose: 92 mg/dL (ref 70–99)
Potassium: 4 mmol/L (ref 3.5–5.2)
Sodium: 138 mmol/L (ref 134–144)
Total Protein: 7.1 g/dL (ref 6.0–8.5)
eGFR: 82 mL/min/{1.73_m2} (ref >59)

## 2021-08-03 ENCOUNTER — Encounter: Payer: Self-pay | Admitting: Anesthesiology

## 2021-08-03 ENCOUNTER — Other Ambulatory Visit: Payer: Self-pay

## 2021-08-03 ENCOUNTER — Ambulatory Visit: Payer: Managed Care, Other (non HMO) | Attending: Anesthesiology | Admitting: Anesthesiology

## 2021-08-03 DIAGNOSIS — M545 Low back pain, unspecified: Secondary | ICD-10-CM

## 2021-08-03 DIAGNOSIS — G894 Chronic pain syndrome: Secondary | ICD-10-CM

## 2021-08-03 DIAGNOSIS — M47816 Spondylosis without myelopathy or radiculopathy, lumbar region: Secondary | ICD-10-CM

## 2021-08-03 DIAGNOSIS — G8929 Other chronic pain: Secondary | ICD-10-CM

## 2021-08-03 DIAGNOSIS — M5136 Other intervertebral disc degeneration, lumbar region: Secondary | ICD-10-CM | POA: Diagnosis not present

## 2021-08-03 DIAGNOSIS — F1190 Opioid use, unspecified, uncomplicated: Secondary | ICD-10-CM

## 2021-08-03 DIAGNOSIS — M25551 Pain in right hip: Secondary | ICD-10-CM

## 2021-08-03 MED ORDER — FENTANYL 12 MCG/HR TD PT72: 1.0000 | MEDICATED_PATCH | TRANSDERMAL | 0 refills | Status: DC

## 2021-08-03 MED ORDER — HYDROCODONE-ACETAMINOPHEN 10-325 MG PO TABS: 1.0000 | ORAL_TABLET | Freq: Two times a day (BID) | ORAL | 0 refills | Status: DC

## 2021-08-03 MED ORDER — HYDROCODONE-ACETAMINOPHEN 10-325 MG PO TABS: 1.0000 | ORAL_TABLET | Freq: Two times a day (BID) | ORAL | 0 refills | Status: AC

## 2021-08-03 NOTE — Patient Instructions (Signed)
Virtual Visit via Telephone Note  I connected with Carolyn Esparza on @TODAY @ at  1:20 PM EST by telephone and verified that I am speaking with the correct person using two identifiers.  Location: Patient: Home Provider: Pain control center   I discussed the limitations, risks, security and privacy concerns of performing an evaluation and management service by telephone and the availability of in person appointments. I also discussed with the patient that there may be a patient responsible charge related to this service. The patient expressed understanding and agreed to proceed.   History of Present Illness: I spoke with Carolyn Esparza today via telephone as she was unable to do the video portion of the conference but she reports that she is doing reasonably well with the recent change in opioid regimen.  She is currently taking the Duragesic patch at the 12 mcg dose and this has been for a considerable period of time and she is stable without it.  We recently increased her hydrocodone 10 mg tablets to twice a day dosing.  This is been working much better for her and she is having less breakthrough pain.  She has been concerned about using Tylenol additionally but we talked about increasing to 2 of the 500 mg tablets up to an additional 4 tablets/day and the need to stay below 4000 mg/day total daily dosing.  The quality characteristic distribution of her pain has been stable with no other changes in lower extremity strength or function.  She is trying to exercise and stay active as best she can otherwise she is in her usual state of health.Review of systems: General: No fevers or chills Pulmonary: No shortness of breath or dyspnea Cardiac: No angina or palpitations or lightheadedness GI: No abdominal pain or constipation Psych: No depression    Observations/Objective: Past Medical History:  Diagnosis Date   Anxiety    ASCUS with positive high risk HPV cervical    CAD (coronary artery disease)     Chicken pox    Chronic pain    neck, back, b/l hips    Chronic pain of right hip 11/24/8339   Complication of anesthesia    DDD (degenerative disc disease), lumbar 01/06/2021   Depression    Facet arthritis of lumbar region 01/06/2021   GERD (gastroesophageal reflux disease)    History of Crohn's disease    Hyperlipidemia    Left ovarian cyst    s/p removal of 1 ovary ? which one removed per pt    Libido, decreased    Skin cancer    reports skin cancer removed from skin in 2016 or 2017   Thyroid nodule    repeat US resolved and Ct neck 05/2019 normal thyroid     Current Outpatient Medications:    [START ON 09/04/2021] HYDROcodone-acetaminophen (NORCO) 10-325 MG tablet, Take 1 tablet by mouth 2 (two) times daily., Disp: 60 tablet, Rfl: 0   atorvastatin (LIPITOR) 10 MG tablet, TAKE 1 TABLET(10 MG) BY MOUTH DAILY AT NIGHT, Disp: 90 tablet, Rfl: 3   buPROPion (WELLBUTRIN XL) 300 MG 24 hr tablet, TAKE 1 TABLET(300 MG) BY MOUTH DAILY, Disp: 90 tablet, Rfl: 3   celecoxib (CELEBREX) 200 MG capsule, TAKE 1 CAPSULE(200 MG) BY MOUTH DAILY, Disp: 90 capsule, Rfl: 3   estradiol (CLIMARA - DOSED IN MG/24 HR) 0.0375 mg/24hr patch, Place 1 patch (0.0375 mg total) onto the skin once a week., Disp: 12 patch, Rfl: 0   [START ON 09/04/2021] fentaNYL (DURAGESIC) 12 MCG/HR, Place 1 patch  onto the skin every 3 (three) days., Disp: 10 patch, Rfl: 0   [START ON 08/18/2021] HYDROcodone-acetaminophen (NORCO) 10-325 MG tablet, Take 1 tablet by mouth 2 (two) times daily for 17 days., Disp: 34 tablet, Rfl: 0   lidocaine (LIDODERM) 5 %, Place 2 patches onto the skin daily. , Disp: , Rfl:    Medium Chain Triglycerides (MCT OIL PO), Take by mouth., Disp: , Rfl:    Multiple Vitamin (MULTI-VITAMINS) TABS, Take 1 tablet by mouth daily. , Disp: , Rfl:    Omega-3 Fatty Acids (FISH OIL PO), Take 1 capsule by mouth daily. , Disp: , Rfl:    omeprazole (PRILOSEC) 20 MG capsule, Take 1 capsule (20 mg total) by mouth daily. In am  before food 30 minutes, Disp: 90 capsule, Rfl: 3   promethazine (PHENERGAN) 25 MG tablet, Take 1 tablet (25 mg total) by mouth daily as needed for nausea or vomiting., Disp: 30 tablet, Rfl: 5   tiZANidine (ZANAFLEX) 4 MG capsule, Take 1 capsule (4 mg total) by mouth at bedtime as needed for muscle spasms., Disp: 30 capsule, Rfl: 5   tiZANidine (ZANAFLEX) 4 MG tablet, TAKE 1 TABLET BY MOUTH EVERY NIGHT AT BEDTIME AS NEEDED FOR MUSCLE SPASMS., Disp: 30 tablet, Rfl: 1   valACYclovir (VALTREX) 1000 MG tablet, 2 pills day 1 then bid X 3-7 days prn, Disp: 60 tablet, Rfl: 11   Assessment and Plan:  1. Chronic pain syndrome   2. Chronic bilateral low back pain without sciatica   3. DDD (degenerative disc disease), lumbar   4. Spondylosis of lumbar region without myelopathy or radiculopathy   5. Chronic, continuous use of opioids   6. Facet arthritis of lumbar region   7. Chronic pain of right hip   Based on her discussion today and after review of the [facility] practitioner database information I think it is appropriate to refill her medicines.  This be for the next 2 months.  We have made an effort to coordinate her Lorcet and Duragesic timing with her next scheduled dosing in February with return to clinic scheduled in 2 months.  She is to continue her exercise regimen with no other changes in her pharmacologic regimen initiated and continue follow-up with her primary care physicians for baseline medical care. Follow Up Instructions:    I discussed the assessment and treatment plan with the patient. The patient was provided an opportunity to ask questions and all were answered. The patient agreed with the plan and demonstrated an understanding of the instructions.   The patient was advised to call back or seek an in-person evaluation if the symptoms worsen or if the condition fails to improve as anticipated.  I provided 30 minutes of non-face-to-face time during this encounter.   Molli Barrows, MD

## 2021-08-05 NOTE — Progress Notes (Signed)
Virtual Visit via Video Note  I connected with Jaydence Vanyo on 08/05/21 at  1:20 PM EST by a video enabled telemedicine application and verified that I am speaking with the correct person using two identifiers.  Location: Patient: Home Provider: Pain control center   I discussed the limitations of evaluation and management by telemedicine and the availability of in person appointments. The patient expressed understanding and agreed to proceed.  History of Present Illness:  spoke with [name] today via telephone as she was unable to do the video portion of the conference but she reports that she is doing reasonably well with the recent change in opioid regimen.  She is currently taking the Duragesic patch at the 12 mcg dose and this has been for a considerable period of time and she is stable without it.  We recently increased her hydrocodone 10 mg tablets to twice a day dosing.  This is been working much better for her and she is having less breakthrough pain.  She has been concerned about using Tylenol additionally but we talked about increasing to 2 of the 500 mg tablets up to an additional 4 tablets/day and the need to stay below 4000 mg/day total daily dosing.  The quality characteristic distribution of her pain has been stable with no other changes in lower extremity strength or function.  She is trying to exercise and stay active as best she can otherwise she is in her usual state of health.Review of systems: General: No fevers or chills Pulmonary: No shortness of breath or dyspnea Cardiac: No angina or palpitations or lightheadedness GI: No abdominal pain or constipation Psych: No depression    Observations/Objective: Past Medical History:  Diagnosis Date   Anxiety    ASCUS with positive high risk HPV cervical    CAD (coronary artery disease)    Chicken pox    Chronic pain    neck, back, b/l hips    Chronic pain of right hip 01/04/3015   Complication of anesthesia    DDD  (degenerative disc disease), lumbar 01/06/2021   Depression    Facet arthritis of lumbar region 01/06/2021   GERD (gastroesophageal reflux disease)    History of Crohn's disease    Hyperlipidemia    Left ovarian cyst    s/p removal of 1 ovary ? which one removed per pt    Libido, decreased    Skin cancer    reports skin cancer removed from skin in 2016 or 2017   Thyroid nodule    repeat US resolved and Ct neck 05/2019 normal thyroid     Current Outpatient Medications:    [START ON 09/04/2021] HYDROcodone-acetaminophen (NORCO) 10-325 MG tablet, Take 1 tablet by mouth 2 (two) times daily., Disp: 60 tablet, Rfl: 0   atorvastatin (LIPITOR) 10 MG tablet, TAKE 1 TABLET(10 MG) BY MOUTH DAILY AT NIGHT, Disp: 90 tablet, Rfl: 3   buPROPion (WELLBUTRIN XL) 300 MG 24 hr tablet, TAKE 1 TABLET(300 MG) BY MOUTH DAILY, Disp: 90 tablet, Rfl: 3   celecoxib (CELEBREX) 200 MG capsule, TAKE 1 CAPSULE(200 MG) BY MOUTH DAILY, Disp: 90 capsule, Rfl: 3   estradiol (CLIMARA - DOSED IN MG/24 HR) 0.0375 mg/24hr patch, Place 1 patch (0.0375 mg total) onto the skin once a week., Disp: 12 patch, Rfl: 0   [START ON 09/04/2021] fentaNYL (DURAGESIC) 12 MCG/HR, Place 1 patch onto the skin every 3 (three) days., Disp: 10 patch, Rfl: 0   [START ON 08/18/2021] HYDROcodone-acetaminophen (NORCO) 10-325 MG tablet, Take  1 tablet by mouth 2 (two) times daily for 17 days., Disp: 34 tablet, Rfl: 0   lidocaine (LIDODERM) 5 %, Place 2 patches onto the skin daily. , Disp: , Rfl:    Medium Chain Triglycerides (MCT OIL PO), Take by mouth., Disp: , Rfl:    Multiple Vitamin (MULTI-VITAMINS) TABS, Take 1 tablet by mouth daily. , Disp: , Rfl:    Omega-3 Fatty Acids (FISH OIL PO), Take 1 capsule by mouth daily. , Disp: , Rfl:    omeprazole (PRILOSEC) 20 MG capsule, Take 1 capsule (20 mg total) by mouth daily. In am before food 30 minutes, Disp: 90 capsule, Rfl: 3   promethazine (PHENERGAN) 25 MG tablet, Take 1 tablet (25 mg total) by mouth daily as  needed for nausea or vomiting., Disp: 30 tablet, Rfl: 5   tiZANidine (ZANAFLEX) 4 MG capsule, Take 1 capsule (4 mg total) by mouth at bedtime as needed for muscle spasms., Disp: 30 capsule, Rfl: 5   tiZANidine (ZANAFLEX) 4 MG tablet, TAKE 1 TABLET BY MOUTH EVERY NIGHT AT BEDTIME AS NEEDED FOR MUSCLE SPASMS., Disp: 30 tablet, Rfl: 1   valACYclovir (VALTREX) 1000 MG tablet, 2 pills day 1 then bid X 3-7 days prn, Disp: 60 tablet, Rfl: 11   Assessment and Plan: 1. Chronic pain syndrome   2. Chronic bilateral low back pain without sciatica   3. DDD (degenerative disc disease), lumbar   4. Spondylosis of lumbar region without myelopathy or radiculopathy   5. Chronic, continuous use of opioids   6. Facet arthritis of lumbar region   7. Chronic pain of right hip     Based on her discussion today and after review of the [facility] practitioner database information I think it is appropriate to refill her medicines.  This be for the next 2 months.  We have made an effort to coordinate her Lorcet and Duragesic timing with her next scheduled dosing in February with return to clinic scheduled in 2 months.  She is to continue her exercise regimen with no other changes in her pharmacologic regimen initiated and continue follow-up with her primary care physicians for baseline medical care. Follow Up Instructions:    I discussed the assessment and treatment plan with the patient. The patient was provided an opportunity to ask questions and all were answered. The patient agreed with the plan and demonstrated an understanding of the instructions.   The patient was advised to call back or seek an in-person evaluation if the symptoms worsen or if the condition fails to improve as anticipated.  I provided 30 minutes of non-face-to-face time during this encounter.   Molli Barrows, MD

## 2021-08-10 ENCOUNTER — Encounter: Payer: Self-pay | Admitting: Obstetrics and Gynecology

## 2021-09-02 DIAGNOSIS — Q6589 Other specified congenital deformities of hip: Secondary | ICD-10-CM | POA: Insufficient documentation

## 2021-09-05 ENCOUNTER — Ambulatory Visit (INDEPENDENT_AMBULATORY_CARE_PROVIDER_SITE_OTHER): Payer: Managed Care, Other (non HMO) | Admitting: Obstetrics and Gynecology

## 2021-09-05 ENCOUNTER — Encounter: Payer: Managed Care, Other (non HMO) | Admitting: Anesthesiology

## 2021-09-05 ENCOUNTER — Other Ambulatory Visit: Payer: Self-pay

## 2021-09-05 ENCOUNTER — Encounter: Payer: Self-pay | Admitting: Obstetrics and Gynecology

## 2021-09-05 VITALS — BP 100/70 | Ht 63.0 in | Wt 141.0 lb

## 2021-09-05 DIAGNOSIS — N951 Menopausal and female climacteric states: Secondary | ICD-10-CM | POA: Diagnosis not present

## 2021-09-05 DIAGNOSIS — Z1231 Encounter for screening mammogram for malignant neoplasm of breast: Secondary | ICD-10-CM | POA: Diagnosis not present

## 2021-09-05 DIAGNOSIS — Z79890 Hormone replacement therapy (postmenopausal): Secondary | ICD-10-CM

## 2021-09-05 DIAGNOSIS — Z01419 Encounter for gynecological examination (general) (routine) without abnormal findings: Secondary | ICD-10-CM

## 2021-09-05 MED ORDER — ESTRADIOL 0.0375 MG/24HR TD PTWK: 0.0375 mg | MEDICATED_PATCH | TRANSDERMAL | 3 refills | Status: DC

## 2021-09-05 NOTE — Progress Notes (Signed)
PCP: [name], [name], MD   Chief Complaint  Patient presents with   Gynecologic Exam    No concerns    HPI:      [name] is a 56 y.o. G1P0010 whose LMP was No LMP recorded. Patient has had a hysterectomy., presents today for her annual examination.  Her menses are absent due to [facility] due to pelvic pain and AUB. She does not have vasomotor sx. On climara HRT patch 0.0375, without sx. Had increased sx with 0.025 dose last yr.  Tried estratest last yr due to decreased libido without sx change/not covered by insurance, so went back to [employer]. Doing well, wants to continue.   Sex activity: not sexually active. She does not have vaginal dryness/sx.  Last Pap: 09/01/20 Results were: no abnormalities/neg HPV DNA. Repeat due Q3 yrs. Neg paps 2019 and 2020. Hx of ASCUS 2017 and 2018; hx of LGSIL in past and colpo 2016. S/p TAH. Hx of STDs: HPV on pap  Last mammogram: 11/05/20  Results were: normal--routine follow-up in 12 months with LT breast cysts There is no FH of breast cancer. There is no FH of ovarian cancer. The patient does not do self-breast exams.  Colonoscopy: 3/22 hx of crohns; had polyp. Repeat due after 3 yrs.  Tobacco use: quit Alcohol use: none  No drug use Exercise: moderately active; awaiting hip surg 5/23  She does get adequate calcium and Vitamin D in her diet.  Labs with PCP.   Past Medical History:  Diagnosis Date   Anxiety    ASCUS with positive high risk HPV cervical    CAD (coronary artery disease)    Chicken pox    Chronic pain    neck, back, b/l hips    Chronic pain of right hip 01/24/349   Complication of anesthesia    DDD (degenerative disc disease), lumbar 01/06/2021   Depression    Facet arthritis of lumbar region 01/06/2021   GERD (gastroesophageal reflux disease)    History of Crohn's disease    Hyperlipidemia    Left ovarian cyst    s/p removal of 1 ovary ? which one removed per pt    Libido, decreased    Skin cancer     reports skin cancer removed from skin in 2016 or 2017   Thyroid nodule    repeat US resolved and Ct neck 05/2019 normal thyroid     Past Surgical History:  Procedure Laterality Date   ABDOMINAL HYSTERECTOMY     APPENDECTOMY  2004   BLADDER SURGERY     BREAST BIOPSY     x 2   COLONOSCOPY     COLONOSCOPY WITH PROPOFOL N/A 10/25/2020   Procedure: COLONOSCOPY WITH PROPOFOL;  Surgeon: Mauri Pole, MD;  Location: [facility];  Service: Endoscopy;  Laterality: N/A;   HEMOSTASIS CLIP PLACEMENT  10/25/2020   Procedure: HEMOSTASIS CLIP PLACEMENT;  Surgeon: Mauri Pole, MD;  Location: [facility];  Service: Endoscopy;;   OOPHORECTOMY Right    Unilateral Right (per MRI 04/17/2018)   POLYPECTOMY  10/25/2020   Procedure: POLYPECTOMY;  Surgeon: Mauri Pole, MD;  Location: [facility];  Service: Endoscopy;;   SUBMUCOSAL LIFTING INJECTION  10/25/2020   Procedure: SUBMUCOSAL LIFTING INJECTION;  Surgeon: Mauri Pole, MD;  Location: [facility];  Service: Endoscopy;;   WISDOM TOOTH EXTRACTION      Family History  Problem Relation Age of Onset   Alcohol abuse Mother    Hyperlipidemia Mother  Heart disease Mother    Hypertension Mother    Diabetes Mother    Crohn's disease Sister        1/2 sister   Depression Sister    Osteoporosis Sister    Leukemia Paternal Grandmother    Cancer Paternal Aunt        blood, type unknown   Intellectual disability Niece    Breast cancer Neg Hx     Social History   Socioeconomic History   Marital status: Married    Spouse name: douglas   Number of children: 0   Years of education: Not on file   Highest education level: Associate degree: occupational, Hotel manager, or vocational program  Occupational History   Occupation: Public relations account executive    Comment: full time  Tobacco Use   Smoking status: Former    Types: Cigarettes    Quit date: 08/29/2000    Years since quitting: 21.0   Smokeless tobacco: Never  Vaping Use   Vaping  Use: Never used  Substance and Sexual Activity   Alcohol use: No    Alcohol/week: 0.0 standard drinks    Comment: social    Drug use: No   Sexual activity: Not Currently    Birth control/protection: Surgical    Comment: Hysterectomy  Other Topics Concern   Not on file  Social History Narrative   Married Doug   Works [employer]    Social Determinants of Radio broadcast assistant Strain: Not on file  Food Insecurity: Not on file  Transportation Needs: Not on file  Physical Activity: Not on file  Stress: Not on file  Social Connections: Not on file  Intimate Partner Violence: Not on file     Current Outpatient Medications:    atorvastatin (LIPITOR) 10 MG tablet, TAKE 1 TABLET(10 MG) BY MOUTH DAILY AT NIGHT, Disp: 90 tablet, Rfl: 3   buPROPion (WELLBUTRIN XL) 300 MG 24 hr tablet, TAKE 1 TABLET(300 MG) BY MOUTH DAILY, Disp: 90 tablet, Rfl: 3   celecoxib (CELEBREX) 200 MG capsule, TAKE 1 CAPSULE(200 MG) BY MOUTH DAILY, Disp: 90 capsule, Rfl: 3   fentaNYL (DURAGESIC) 12 MCG/HR, Place 1 patch onto the skin every 3 (three) days., Disp: 10 patch, Rfl: 0   HYDROcodone-acetaminophen (NORCO) 10-325 MG tablet, Take 1 tablet by mouth 2 (two) times daily., Disp: 60 tablet, Rfl: 0   lidocaine (LIDODERM) 5 %, Place 2 patches onto the skin daily. , Disp: , Rfl:    Medium Chain Triglycerides (MCT OIL PO), Take by mouth., Disp: , Rfl:    Multiple Vitamin (MULTI-VITAMINS) TABS, Take 1 tablet by mouth daily. , Disp: , Rfl:    Omega-3 Fatty Acids (FISH OIL PO), Take 1 capsule by mouth daily. , Disp: , Rfl:    omeprazole (PRILOSEC) 20 MG capsule, Take 1 capsule (20 mg total) by mouth daily. In am before food 30 minutes, Disp: 90 capsule, Rfl: 3   promethazine (PHENERGAN) 25 MG tablet, Take 1 tablet (25 mg total) by mouth daily as needed for nausea or vomiting., Disp: 30 tablet, Rfl: 5   tiZANidine (ZANAFLEX) 4 MG capsule, Take 1 capsule (4 mg total) by mouth at bedtime as needed for muscle spasms.,  Disp: 30 capsule, Rfl: 5   tiZANidine (ZANAFLEX) 4 MG tablet, TAKE 1 TABLET BY MOUTH EVERY NIGHT AT BEDTIME AS NEEDED FOR MUSCLE SPASMS., Disp: 30 tablet, Rfl: 1   valACYclovir (VALTREX) 1000 MG tablet, 2 pills day 1 then bid X 3-7 days prn, Disp: 60 tablet,  Rfl: 11   estradiol (CLIMARA - DOSED IN MG/24 HR) 0.0375 mg/24hr patch, Place 1 patch (0.0375 mg total) onto the skin once a week., Disp: 12 patch, Rfl: 3     ROS:  Review of Systems  Constitutional:  Negative for fatigue, fever and unexpected weight change.  Respiratory:  Negative for cough, shortness of breath and wheezing.   Cardiovascular:  Negative for chest pain, palpitations and leg swelling.  Gastrointestinal:  Negative for blood in stool, constipation, diarrhea, nausea and vomiting.  Endocrine: Negative for cold intolerance, heat intolerance and polyuria.  Genitourinary:  Negative for dyspareunia, dysuria, flank pain, frequency, genital sores, hematuria, menstrual problem, pelvic pain, urgency, vaginal bleeding, vaginal discharge and vaginal pain.  Musculoskeletal:  Negative for back pain, joint swelling and myalgias.  Skin:  Negative for rash.  Neurological:  Negative for dizziness, syncope, light-headedness, numbness and headaches.  Hematological:  Negative for adenopathy.  Psychiatric/Behavioral:  Negative for agitation, confusion, dysphoric mood, sleep disturbance and suicidal ideas. The patient is not nervous/anxious.  BREAST: No symptoms    Objective: BP 100/70    Ht 5\' 3"  (1.6 m)    Wt 141 lb (64 kg)    BMI 24.98 kg/m    Physical Exam Constitutional:      Appearance: She is well-developed.  Genitourinary:     Vulva normal.     Genitourinary Comments: UTERUS/CX SURG REM     Right Labia: No rash, tenderness or lesions.    Left Labia: No tenderness, lesions or rash.    Vaginal cuff intact.    No vaginal discharge, erythema or tenderness.      Right Adnexa: absent.    Right Adnexa: not tender and no mass  present.    Left Adnexa: not tender and no mass present.    Cervix is absent.     Uterus is absent.  Breasts:    Right: No mass, nipple discharge, skin change or tenderness.     Left: No mass, nipple discharge, skin change or tenderness.  Neck:     Thyroid: No thyromegaly.  Cardiovascular:     Rate and Rhythm: Normal rate and regular rhythm.     Heart sounds: Normal heart sounds. No murmur heard. Pulmonary:     Effort: Pulmonary effort is normal.     Breath sounds: Normal breath sounds.  Abdominal:     Palpations: Abdomen is soft.     Tenderness: There is no abdominal tenderness. There is no guarding.  Musculoskeletal:        General: Normal range of motion.     Cervical back: Normal range of motion.  Neurological:     General: No focal deficit present.     Mental Status: She is alert and oriented to person, place, and time.     Cranial Nerves: No cranial nerve deficit.  Skin:    General: Skin is warm and dry.  Psychiatric:        Mood and Affect: Mood normal.        Behavior: Behavior normal.        Thought Content: Thought content normal.        Judgment: Judgment normal.  Vitals reviewed.    Assessment/Plan:  Encounter for annual routine gynecological examination  Encounter for screening mammogram for malignant neoplasm of breast; has order with PCP  Hormone replacement therapy (HRT) - Plan: estradiol (CLIMARA - DOSED IN MG/24 HR) 0.0375 mg/24hr patch; Rx RF. Doing well.  Vasomotor symptoms due to menopause - Plan: estradiol (  CLIMARA - DOSED IN MG/24 HR) 0.0375 mg/24hr patch    Meds ordered this encounter  Medications   estradiol (CLIMARA - DOSED IN MG/24 HR) 0.0375 mg/24hr patch    Sig: Place 1 patch (0.0375 mg total) onto the skin once a week.    Dispense:  12 patch    Refill:  3    Order Specific Question:   Supervising Provider    Answer:   Gae Dry [[id number]]           GYN counsel breast self exam, mammography screening, menopause, adequate  intake of calcium and vitamin D, diet and exercise    F/U  Return in about 1 year (around 09/05/2022).  Charizma Gardiner B. Benjamyn Hestand, PA-C 09/05/2021 10:45 AM

## 2021-09-05 NOTE — Patient Instructions (Addendum)
I value your feedback and you entrusting us with your care. If you get a [facility] patient survey, I would appreciate you taking the time to let us know about your experience today. Thank you!  [facility]: [phone]      

## 2021-09-19 ENCOUNTER — Ambulatory Visit: Payer: Managed Care, Other (non HMO) | Attending: Anesthesiology | Admitting: Anesthesiology

## 2021-09-19 ENCOUNTER — Other Ambulatory Visit: Payer: Self-pay

## 2021-09-21 ENCOUNTER — Other Ambulatory Visit: Payer: Self-pay | Admitting: Anesthesiology

## 2021-09-26 ENCOUNTER — Encounter: Payer: Self-pay | Admitting: Obstetrics and Gynecology

## 2021-10-03 ENCOUNTER — Telehealth: Payer: Self-pay | Admitting: Anesthesiology

## 2021-10-03 ENCOUNTER — Other Ambulatory Visit: Payer: Self-pay

## 2021-10-03 ENCOUNTER — Ambulatory Visit: Payer: Managed Care, Other (non HMO) | Attending: Anesthesiology | Admitting: Anesthesiology

## 2021-10-03 ENCOUNTER — Encounter: Payer: Self-pay | Admitting: Anesthesiology

## 2021-10-03 VITALS — BP 133/85 | HR 74 | Temp 98.1°F | Resp 18 | Ht 63.0 in | Wt 138.0 lb

## 2021-10-03 DIAGNOSIS — M47816 Spondylosis without myelopathy or radiculopathy, lumbar region: Secondary | ICD-10-CM | POA: Diagnosis not present

## 2021-10-03 DIAGNOSIS — M25551 Pain in right hip: Secondary | ICD-10-CM | POA: Diagnosis present

## 2021-10-03 DIAGNOSIS — G8929 Other chronic pain: Secondary | ICD-10-CM | POA: Diagnosis present

## 2021-10-03 DIAGNOSIS — M545 Low back pain, unspecified: Secondary | ICD-10-CM | POA: Diagnosis not present

## 2021-10-03 DIAGNOSIS — M51369 Other intervertebral disc degeneration, lumbar region without mention of lumbar back pain or lower extremity pain: Secondary | ICD-10-CM

## 2021-10-03 DIAGNOSIS — G894 Chronic pain syndrome: Secondary | ICD-10-CM

## 2021-10-03 DIAGNOSIS — F1190 Opioid use, unspecified, uncomplicated: Secondary | ICD-10-CM | POA: Diagnosis present

## 2021-10-03 DIAGNOSIS — M5136 Other intervertebral disc degeneration, lumbar region: Secondary | ICD-10-CM | POA: Diagnosis not present

## 2021-10-03 MED ORDER — FENTANYL 12 MCG/HR TD PT72: 1.0000 | MEDICATED_PATCH | TRANSDERMAL | 0 refills | Status: AC

## 2021-10-03 MED ORDER — FENTANYL 12 MCG/HR TD PT72
1.0000 | MEDICATED_PATCH | TRANSDERMAL | 0 refills | Status: AC
Start: 2021-10-28 — End: 2021-11-27

## 2021-10-03 MED ORDER — HYDROCODONE-ACETAMINOPHEN 10-325 MG PO TABS: 1.0000 | ORAL_TABLET | Freq: Four times a day (QID) | ORAL | 0 refills | Status: AC | PRN

## 2021-10-03 MED ORDER — HYDROCODONE-ACETAMINOPHEN 10-325 MG PO TABS
1.0000 | ORAL_TABLET | Freq: Two times a day (BID) | ORAL | 0 refills | Status: AC
Start: 2021-10-12 — End: 2021-11-11

## 2021-10-03 NOTE — Telephone Encounter (Signed)
Called patient, she has appt today. ?

## 2021-10-03 NOTE — Progress Notes (Unsigned)
Subjective:  Patient ID: Carolyn Esparza, female    DOB: 12-18-1965  Age: 56 y.o. MRN: [id number]  CC: Back Pain (Low right) and Hip Pain (right)   Procedure: None  HPI Carolyn Esparza presents for reevaluation.  She is doing quite well with her current Duragesic 12 mic patch.  She takes this every 72 hours and this keeps good baseline control for her.  She still taking her hydrocodone but averages between 1 and 2/day and uses this for breakthrough pain.  No side effects are reported and she continues to derive good functional lifestyle improvement with the medications.  Quality characteristic and distribution of her low back pain and leg pain is stable in nature without recent change.  She still takes her Celebrex 1 tab per day in addition to the above.  No change in lower extremity strength or function or bowel or bladder function is noted.  She still has some right lumbar spasming periodically but otherwise seems to be doing reasonably well at this time.  Outpatient Medications Prior to Visit  Medication Sig Dispense Refill   atorvastatin (LIPITOR) 10 MG tablet TAKE 1 TABLET(10 MG) BY MOUTH DAILY AT NIGHT 90 tablet 3   buPROPion (WELLBUTRIN XL) 300 MG 24 hr tablet TAKE 1 TABLET(300 MG) BY MOUTH DAILY 90 tablet 3   celecoxib (CELEBREX) 200 MG capsule TAKE 1 CAPSULE(200 MG) BY MOUTH DAILY 90 capsule 3   estradiol (CLIMARA - DOSED IN MG/24 HR) 0.0375 mg/24hr patch Place 1 patch (0.0375 mg total) onto the skin once a week. 12 patch 3   lidocaine (LIDODERM) 5 % Place 2 patches onto the skin daily.      Medium Chain Triglycerides (MCT OIL PO) Take by mouth.     Multiple Vitamin (MULTI-VITAMINS) TABS Take 1 tablet by mouth daily.      Omega-3 Fatty Acids (FISH OIL PO) Take 1 capsule by mouth daily.      omeprazole (PRILOSEC) 20 MG capsule Take 1 capsule (20 mg total) by mouth daily. In am before food 30 minutes 90 capsule 3   promethazine (PHENERGAN) 25 MG tablet Take 1 tablet (25 mg total) by mouth  daily as needed for nausea or vomiting. 30 tablet 5   tiZANidine (ZANAFLEX) 4 MG capsule Take 1 capsule (4 mg total) by mouth at bedtime as needed for muscle spasms. 30 capsule 5   tiZANidine (ZANAFLEX) 4 MG tablet TAKE 1 TABLET BY MOUTH EVERY NIGHT AT BEDTIME AS NEEDED FOR MUSCLE SPASMS. 30 tablet 1   valACYclovir (VALTREX) 1000 MG tablet 2 pills day 1 then bid X 3-7 days prn 60 tablet 11   fentaNYL (DURAGESIC) 12 MCG/HR Place 1 patch onto the skin every 3 (three) days. 10 patch 0   HYDROcodone-acetaminophen (NORCO) 10-325 MG tablet Take 1 tablet by mouth 2 (two) times daily. 60 tablet 0   No facility-administered medications prior to visit.    Review of Systems CNS: No confusion or sedation Cardiac: No angina or palpitations GI: No abdominal pain or constipation Constitutional: No nausea vomiting fevers or chills  Objective:  BP 133/85    Pulse 74    Temp 98.1 F (36.7 C)    Resp 18    Ht '5\' 3"'$  (1.6 m)    Wt 138 lb (62.6 kg)    SpO2 100%    BMI 24.45 kg/m    BP Readings from Last 3 Encounters:  10/03/21 133/85  09/05/21 100/70  07/01/21 110/68     Wt Readings from  Last 3 Encounters:  10/03/21 138 lb (62.6 kg)  09/05/21 141 lb (64 kg)  07/01/21 144 lb (65.3 kg)     Physical Exam Pt is alert and oriented PERRL EOMI HEART IS RRR no murmur or rub LCTA no wheezing or rales MUSCULOSKELETAL reveals a trigger point of the right lumbar region approximately L4 level.  She has some mild pain with standing and extension with left and right lateral rotation.  Right lateral rotation seems to reproduce some of her pain complaint.  Percussion and palpation over this right trigger point does reproduce some of her pain.  She ambulates with appropriate strength and a slightly antalgic gait but otherwise muscle tone and bulk is at baseline.  Labs  Lab Results  Component Value Date   HGBA1C 5.2 04/12/2021   Lab Results  Component Value Date   LDLCALC 113 (H) 04/12/2021   CREATININE  0.84 07/15/2021    -------------------------------------------------------------------------------------------------------------------- Lab Results  Component Value Date   WBC 5.8 04/12/2021   HGB 13.6 04/12/2021   HCT 41.1 04/12/2021   PLT 305 04/12/2021   GLUCOSE 92 07/15/2021   CHOL 182 04/12/2021   TRIG 122 04/12/2021   HDL 47 04/12/2021   LDLCALC 113 (H) 04/12/2021   ALT 20 07/15/2021   AST 22 07/15/2021   NA 138 07/15/2021   K 4.0 07/15/2021   CL 99 07/15/2021   CREATININE 0.84 07/15/2021   BUN 16 07/15/2021   CO2 23 07/15/2021   TSH 3.360 04/12/2021   INR 1.1 05/19/2014   HGBA1C 5.2 04/12/2021    --------------------------------------------------------------------------------------------------------------------- MR HIP RIGHT WO CONTRAST  Result Date: 05/03/2021 CLINICAL DATA:  Right hip pain EXAM: MR OF THE RIGHT HIP WITHOUT CONTRAST TECHNIQUE: Multiplanar, multisequence MR imaging was performed. No intravenous contrast was administered. COMPARISON:  None. FINDINGS: Bones: No hip fracture, dislocation or avascular necrosis. No periosteal reaction or bone destruction. No aggressive osseous lesion. Normal sacrum and sacroiliac joints. No SI joint widening or erosive changes. Degenerative disease with disc height loss at L4-5 and L5-S1. Articular cartilage and labrum Articular cartilage:  No chondral defect. Labrum:  Probable small superior anterior right labral tear. Joint or bursal effusion Joint effusion:  No hip joint effusion.  No SI joint effusion. Bursae:  No bursa formation. Muscles and tendons Flexors: Normal. Extensors: Normal. Abductors: Normal. Adductors: Normal. Gluteals: Mild tendinosis of the right gluteus minimus and medius tendon insertion. Mild tendinosis of the left gluteus medius and minimus tendon insertion. Hamstrings: Normal. Other findings No pelvic free fluid. No fluid collection or hematoma. No inguinal lymphadenopathy. No inguinal hernia. IMPRESSION: 1.  No hip fracture, dislocation or avascular necrosis. 2. Mild tendinosis of the right gluteus minimus and medius tendon insertion. 3. Mild tendinosis of the left gluteus medius and minimus tendon insertion. Electronically Signed   By: Kathreen Devoid M.D.   On: 05/03/2021 16:55     Assessment & Plan:   Carolyn Esparza was seen today for back pain and hip pain.  Diagnoses and all orders for this visit:  Chronic bilateral low back pain without sciatica  DDD (degenerative disc disease), lumbar  Spondylosis of lumbar region without myelopathy or radiculopathy  Chronic pain syndrome -     ToxASSURE Select 13 (MW), Urine  Chronic, continuous use of opioids -     ToxASSURE Select 13 (MW), Urine  Facet arthritis of lumbar region  Chronic pain of right hip  Other orders -     fentaNYL (DURAGESIC) 12 MCG/HR; Place 1 patch onto the  skin every 3 (three) days. -     fentaNYL (DURAGESIC) 12 MCG/HR; Place 1 patch onto the skin every 3 (three) days. -     HYDROcodone-acetaminophen (NORCO) 10-325 MG tablet; Take 1 tablet by mouth 2 (two) times daily. -     HYDROcodone-acetaminophen (NORCO) 10-325 MG tablet; Take 1 tablet by mouth every 6 (six) hours as needed for moderate pain or severe pain.        ----------------------------------------------------------------------------------------------------------------------  Problem List Items Addressed This Visit       Unprioritized   Chronic back pain - Primary   Relevant Medications   fentaNYL (DURAGESIC) 12 MCG/HR (Start on 10/28/2021)   fentaNYL (DURAGESIC) 12 MCG/HR (Start on 11/26/2021)   HYDROcodone-acetaminophen (NORCO) 10-325 MG tablet (Start on 10/12/2021)   HYDROcodone-acetaminophen (NORCO) 10-325 MG tablet (Start on 11/26/2021)   Chronic pain   Relevant Medications   fentaNYL (DURAGESIC) 12 MCG/HR (Start on 10/28/2021)   fentaNYL (DURAGESIC) 12 MCG/HR (Start on 11/26/2021)   HYDROcodone-acetaminophen (NORCO) 10-325 MG tablet (Start on 10/12/2021)    HYDROcodone-acetaminophen (NORCO) 10-325 MG tablet (Start on 11/26/2021)   Other Relevant Orders   ToxASSURE Select 13 (MW), Urine   Chronic pain of right hip   Relevant Medications   fentaNYL (DURAGESIC) 12 MCG/HR (Start on 10/28/2021)   fentaNYL (DURAGESIC) 12 MCG/HR (Start on 11/26/2021)   HYDROcodone-acetaminophen (NORCO) 10-325 MG tablet (Start on 10/12/2021)   HYDROcodone-acetaminophen (NORCO) 10-325 MG tablet (Start on 11/26/2021)   DDD (degenerative disc disease), lumbar   Relevant Medications   fentaNYL (DURAGESIC) 12 MCG/HR (Start on 10/28/2021)   fentaNYL (DURAGESIC) 12 MCG/HR (Start on 11/26/2021)   HYDROcodone-acetaminophen (NORCO) 10-325 MG tablet (Start on 10/12/2021)   HYDROcodone-acetaminophen (NORCO) 10-325 MG tablet (Start on 11/26/2021)   Facet arthritis of lumbar region   Relevant Medications   fentaNYL (DURAGESIC) 12 MCG/HR (Start on 10/28/2021)   fentaNYL (DURAGESIC) 12 MCG/HR (Start on 11/26/2021)   HYDROcodone-acetaminophen (NORCO) 10-325 MG tablet (Start on 10/12/2021)   HYDROcodone-acetaminophen ([place]) 10-325 MG tablet (Start on 11/26/2021)   Other Visit Diagnoses     Spondylosis of lumbar region without myelopathy or radiculopathy       Relevant Medications   fentaNYL (DURAGESIC) 12 MCG/HR (Start on 10/28/2021)   fentaNYL (DURAGESIC) 12 MCG/HR (Start on 11/26/2021)   HYDROcodone-acetaminophen (NORCO) 10-325 MG tablet (Start on 10/12/2021)   HYDROcodone-acetaminophen (NORCO) 10-325 MG tablet (Start on 11/26/2021)   Chronic, continuous use of opioids       Relevant Orders   ToxASSURE Select 13 (MW), Urine         ----------------------------------------------------------------------------------------------------------------------  1. Chronic bilateral low back pain without sciatica I want her to continue with the current stretching strengthening exercises and current medication management.  She seems to be doing well with this.  No other changes are made in her  regimen today.  2. DDD (degenerative disc disease), lumbar As above  3. Spondylosis of lumbar region without myelopathy or radiculopathy   4. Chronic pain syndrome  - ToxASSURE Select 13 (MW), Urine  5. Chronic, continuous use of opioids I have reviewed the [facility] practitioner database information and it is appropriate for refill of her medications today.  Is a be dated for the next 2 months with return to clinic scheduled in 2 months.  She is due for routine urine screen today.  No other changes in her regimen are initiated. - ToxASSURE Select 13 (MW), Urine  6. Facet arthritis of lumbar region As above and continue with core stretching strengthening  exercises.  7. Chronic pain of right hip Continue follow-up with her primary care physicians and orthopedist for baseline routine evaluation.    ----------------------------------------------------------------------------------------------------------------------  I am having Carolyn Esparza start on fentaNYL and HYDROcodone-acetaminophen. I am also having her maintain her Multi-Vitamins, lidocaine, Omega-3 Fatty Acids (FISH OIL PO), Medium Chain Triglycerides (MCT OIL PO), omeprazole, buPROPion, atorvastatin, celecoxib, promethazine, tiZANidine, valACYclovir, tiZANidine, estradiol, fentaNYL, and HYDROcodone-acetaminophen.   Meds ordered this encounter  Medications   fentaNYL (DURAGESIC) 12 MCG/HR    Sig: Place 1 patch onto the skin every 3 (three) days.    Dispense:  10 patch    Refill:  0   fentaNYL (DURAGESIC) 12 MCG/HR    Sig: Place 1 patch onto the skin every 3 (three) days.    Dispense:  10 patch    Refill:  0   HYDROcodone-acetaminophen (NORCO) 10-325 MG tablet    Sig: Take 1 tablet by mouth 2 (two) times daily.    Dispense:  60 tablet    Refill:  0   HYDROcodone-acetaminophen (NORCO) 10-325 MG tablet    Sig: Take 1 tablet by mouth every 6 (six) hours as needed for moderate pain or severe pain.    Dispense:  60  tablet    Refill:  0   Patient's Medications  New Prescriptions   FENTANYL (DURAGESIC) 12 MCG/HR    Place 1 patch onto the skin every 3 (three) days.   HYDROCODONE-ACETAMINOPHEN (NORCO) 10-325 MG TABLET    Take 1 tablet by mouth every 6 (six) hours as needed for moderate pain or severe pain.  Previous Medications   ATORVASTATIN (LIPITOR) 10 MG TABLET    TAKE 1 TABLET(10 MG) BY MOUTH DAILY AT NIGHT   BUPROPION (WELLBUTRIN XL) 300 MG 24 HR TABLET    TAKE 1 TABLET(300 MG) BY MOUTH DAILY   CELECOXIB (CELEBREX) 200 MG CAPSULE    TAKE 1 CAPSULE(200 MG) BY MOUTH DAILY   ESTRADIOL (CLIMARA - DOSED IN MG/24 HR) 0.0375 MG/24HR PATCH    Place 1 patch (0.0375 mg total) onto the skin once a week.   LIDOCAINE (LIDODERM) 5 %    Place 2 patches onto the skin daily.    MEDIUM CHAIN TRIGLYCERIDES (MCT OIL PO)    Take by mouth.   MULTIPLE VITAMIN (MULTI-VITAMINS) TABS    Take 1 tablet by mouth daily.    OMEGA-3 FATTY ACIDS (FISH OIL PO)    Take 1 capsule by mouth daily.    OMEPRAZOLE (PRILOSEC) 20 MG CAPSULE    Take 1 capsule (20 mg total) by mouth daily. In am before food 30 minutes   PROMETHAZINE (PHENERGAN) 25 MG TABLET    Take 1 tablet (25 mg total) by mouth daily as needed for nausea or vomiting.   TIZANIDINE (ZANAFLEX) 4 MG CAPSULE    Take 1 capsule (4 mg total) by mouth at bedtime as needed for muscle spasms.   TIZANIDINE (ZANAFLEX) 4 MG TABLET    TAKE 1 TABLET BY MOUTH EVERY NIGHT AT BEDTIME AS NEEDED FOR MUSCLE SPASMS.   VALACYCLOVIR (VALTREX) 1000 MG TABLET    2 pills day 1 then bid X 3-7 days prn  Modified Medications   Modified Medication Previous Medication   FENTANYL (DURAGESIC) 12 MCG/HR fentaNYL (DURAGESIC) 12 MCG/HR      Place 1 patch onto the skin every 3 (three) days.    Place 1 patch onto the skin every 3 (three) days.   HYDROCODONE-ACETAMINOPHEN (NORCO) 10-325 MG TABLET HYDROcodone-acetaminophen (NORCO) 10-325 MG tablet  Take 1 tablet by mouth 2 (two) times daily.    Take 1 tablet by  mouth 2 (two) times daily.  Discontinued Medications   No medications on file   ----------------------------------------------------------------------------------------------------------------------  Follow-up: No follow-ups on file.    Molli Barrows, MD

## 2021-10-03 NOTE — Progress Notes (Signed)
Nursing Pain Medication Assessment:  ?Safety precautions to be maintained throughout the outpatient stay will include: orient to surroundings, keep bed in low position, maintain call bell within reach at all times, provide assistance with transfer out of bed and ambulation.  ?Medication Inspection Compliance: Pill count conducted under aseptic conditions, in front of the patient. Neither the pills nor the bottle was removed from the patient's sight at any time. Once count was completed pills were immediately returned to the patient in their original bottle. ? ?Medication: Hydrocodone/APAP ?Pill/Patch Count:  15 of 60 pills remain ?Pill/Patch Appearance: Markings consistent with prescribed medication ?Bottle Appearance: Standard pharmacy container. Clearly labeled. ?Filled Date: 02 / 06 / 2023 ?Last Medication intake:  Today ?Fentanyl patch ?10/20 ?Filled 09-28-2021 ?

## 2021-10-06 LAB — TOXASSURE SELECT 13 (MW), URINE

## 2021-10-07 ENCOUNTER — Other Ambulatory Visit: Payer: Self-pay | Admitting: Internal Medicine

## 2021-11-11 DIAGNOSIS — Z79890 Hormone replacement therapy (postmenopausal): Secondary | ICD-10-CM | POA: Insufficient documentation

## 2021-11-11 DIAGNOSIS — F1190 Opioid use, unspecified, uncomplicated: Secondary | ICD-10-CM | POA: Insufficient documentation

## 2021-11-23 ENCOUNTER — Encounter: Payer: Managed Care, Other (non HMO) | Admitting: Anesthesiology

## 2021-11-28 ENCOUNTER — Ambulatory Visit
Admission: RE | Admit: 2021-11-28 | Discharge: 2021-11-28 | Disposition: A | Discharge status: Home / Self Care | Payer: Managed Care, Other (non HMO) | Source: Ambulatory Visit | Attending: Internal Medicine | Admitting: Internal Medicine

## 2021-11-28 DIAGNOSIS — Z1231 Encounter for screening mammogram for malignant neoplasm of breast: Secondary | ICD-10-CM

## 2022-01-03 ENCOUNTER — Other Ambulatory Visit: Payer: Self-pay | Admitting: Family

## 2022-01-10 ENCOUNTER — Other Ambulatory Visit: Payer: Self-pay | Admitting: Internal Medicine

## 2022-01-10 DIAGNOSIS — K219 Gastro-esophageal reflux disease without esophagitis: Secondary | ICD-10-CM

## 2022-01-10 MED ORDER — OMEPRAZOLE 20 MG PO CPDR: 20.0000 mg | DELAYED_RELEASE_CAPSULE | Freq: Every day | ORAL | 3 refills | Status: DC

## 2022-01-26 ENCOUNTER — Encounter: Payer: Self-pay | Admitting: Physical Therapy

## 2022-01-26 ENCOUNTER — Ambulatory Visit: Payer: Managed Care, Other (non HMO) | Attending: Orthopedic Surgery | Admitting: Physical Therapy

## 2022-01-26 DIAGNOSIS — M25552 Pain in left hip: Secondary | ICD-10-CM | POA: Diagnosis present

## 2022-01-26 DIAGNOSIS — M6281 Muscle weakness (generalized): Secondary | ICD-10-CM | POA: Insufficient documentation

## 2022-01-26 DIAGNOSIS — R262 Difficulty in walking, not elsewhere classified: Secondary | ICD-10-CM | POA: Diagnosis present

## 2022-01-26 DIAGNOSIS — M25551 Pain in right hip: Secondary | ICD-10-CM | POA: Insufficient documentation

## 2022-01-26 DIAGNOSIS — R2681 Unsteadiness on feet: Secondary | ICD-10-CM | POA: Insufficient documentation

## 2022-01-26 NOTE — Therapy (Signed)
OUTPATIENT PHYSICAL THERAPY EVALUATION   Patient Name: Carolyn Esparza MRN: [id number] DOB:06-12-1966, 56 y.o., female Today's Date: 01/26/2022   PT End of Session - 01/26/22 2002     Visit Number 1    Number of Visits 24    Date for PT Re-Evaluation 04/20/22    Authorization Type [employer] reporting period from 01/26/2022    Authorization Time Period VL: max combined 60 PT/OT per year    Authorization - Visit Number 1    Authorization - Number of Visits 60    Progress Note Due on Visit 10    PT Start Time 1305    PT Stop Time 1345    PT Time Calculation (min) 40 min    Activity Tolerance Patient tolerated treatment well    Behavior During Therapy WFL for tasks assessed/performed             Past Medical History:  Diagnosis Date   Anxiety    ASCUS with positive high risk HPV cervical    CAD (coronary artery disease)    Chicken pox    Chronic pain    neck, back, b/l hips    Chronic pain of right hip 5/49/8264   Complication of anesthesia    DDD (degenerative disc disease), lumbar 01/06/2021   Depression    Facet arthritis of lumbar region 01/06/2021   GERD (gastroesophageal reflux disease)    History of Crohn's disease    Hyperlipidemia    Left ovarian cyst    s/p removal of 1 ovary ? which one removed per pt    Libido, decreased    Skin cancer    reports skin cancer removed from skin in 2016 or 2017   Thyroid nodule    repeat US resolved and Ct neck 05/2019 normal thyroid    Past Surgical History:  Procedure Laterality Date   ABDOMINAL HYSTERECTOMY     APPENDECTOMY  2004   BLADDER SURGERY     BREAST BIOPSY     x 2   COLONOSCOPY     COLONOSCOPY WITH PROPOFOL N/A 10/25/2020   Procedure: COLONOSCOPY WITH PROPOFOL;  Surgeon: Mauri Pole, MD;  Location: [facility];  Service: Endoscopy;  Laterality: N/A;   HEMOSTASIS CLIP PLACEMENT  10/25/2020   Procedure: HEMOSTASIS CLIP PLACEMENT;  Surgeon: Mauri Pole, MD;  Location: [facility];  Service:  Endoscopy;;   OOPHORECTOMY Right    Unilateral Right (per MRI 04/17/2018)   POLYPECTOMY  10/25/2020   Procedure: POLYPECTOMY;  Surgeon: Mauri Pole, MD;  Location: [facility];  Service: Endoscopy;;   SUBMUCOSAL LIFTING INJECTION  10/25/2020   Procedure: SUBMUCOSAL LIFTING INJECTION;  Surgeon: Mauri Pole, MD;  Location: [facility];  Service: Endoscopy;;   WISDOM TOOTH EXTRACTION     Patient Active Problem List   Diagnosis Date Noted   Cervicalgia 07/01/2021   Low back pain 07/01/2021   Herpes infection 07/01/2021   Chronic pain of right hip 04/28/2021   DDD (degenerative disc disease), lumbar 01/06/2021   Facet arthritis of lumbar region 01/06/2021   Trochanteric bursitis of both hips 11/30/2020   Special screening for malignant neoplasms, colon    Polyp of cecum    Pain in both feet 04/12/2020   Burning sensation of feet 04/12/2020   Pain of both hip joints 04/12/2020   Onychomycosis 04/12/2020   Fibrocystic breast disease (FCBD) 04/12/2020   Numbness in both legs 09/09/2019   Spinal stenosis in cervical region 05/09/2019   Facet arthropathy, cervical 05/09/2019  Chronic pain 11/27/2018   Obesity (BMI 30.0-34.9) 11/27/2018   Sprain and strain of hip and thigh 05/10/2018   Abnormal MRI, cervical spine 03/28/2018   Menopause 11/26/2017   Right upper quadrant pain 11/26/2017   Abnormal Pap smear of cervix 07/27/2017   Vasomotor flushing 07/19/2017   Depression 07/18/2017   Nausea 06/25/2017   Chronic pain of both hips 06/25/2017   OSA (obstructive sleep apnea) 04/18/2017   Annual physical exam 04/10/2017   Fatigue 12/16/2016   Chronic back pain 05/30/2016   Abdominal pain 03/12/2016   Anxiety and depression 12/28/2015   History of obstructive sleep apnea 12/15/2015   GERD (gastroesophageal reflux disease) 12/15/2015   Hyperlipidemia 12/15/2015   Pain medication agreement signed 03/25/2015   CAD in native artery 02/05/2015   Right supraspinatus  tenosynovitis 08/17/2014   Crohn's disease involving terminal ileum ([place]) 03/30/2014   FH: colon polyps 03/30/2014   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   Other bursitis disorders 07/12/2012   Pain in joint, pelvic region and thigh 07/12/2012   Crohn's disease ([place]) 01/23/2012    PCP: [name], [name], MD  REFERRING PROVIDER: Raelene Bott, PA  REFERRING DIAG: tear of right gluteus medius tendon, trochanteric bursitis of both hips, enthesopathy of hip region on both sides  THERAPY DIAG:  Pain in right hip  Pain in left hip  Difficulty in walking, not elsewhere classified  Muscle weakness (generalized)  Unsteadiness on feet  Rationale for Evaluation and Treatment: Rehabilitation  ONSET DATE: s/p OPEN REPAIR R GLUTEUS MEDIUS gluteal repair 12/05/2021, chronic R hip pain over years prior to that.   SUBJECTIVE:                                                                                                                                                                                                         SUBJECTIVE STATEMENT: Patient states she did PT for her chronic right hip twice in the past and felt that she had maximized her improvement with PT so she sought out surgical intervention. She met 4 surgeons before she found one she was comfortable undergoing surgery with. She really wanted arthroscopic surgery but eventually chose Dr. Arvella Nigh who did an OPEN REPAIR to R GLUTEUS MEDIUS on 12/05/2021. She states things have been going well since surgery.. She just had her most recent follow up appointment earlier today. She states the tested her ability to hold her R leg in abduction in sidelying and he said she could start using the RW more for balance in walking instead of for weight support. She  has not tried this yet. She had an abduction brace at the hospital and wore it to her 2 week appointment and when she went out somewhere, but she didn't wear it much overall. It  made her cry when she put it on and she felt she understood and could follow the directions given to her in the hospital about precautions/restrictions. She did a few exercises she was given the first 2 weeks, then stopped when she found out her doctor preferred she did not do anything for the first 6 weeks. She has been able to go back to working at home after 3 weeks off. She had an RF procedure in April which has made her chronic back pain tolerable but her back feels "off" with trying to keep weight off of her R LE since the surgery. Patient also has chronic left hip pain but it does not bother her as much currently.   PERTINENT HISTORY:  Patient is a 56 y.o. female who presents to outpatient physical therapy with a referral for medical diagnosis tear of right gluteus medius tendon, trochanteric bursitis of both hips, enthesopathy of hip region on both sides. This patient's chief complaints consist of right hip pain, stiffness, weakness, abnormal gait, difficulty with balance, and dysfunction s/p OPEN REPAIR R GLUTEUS MEDIUS gluteal repair 12/05/2021, leading to the following functional deficits: difficulty with usual activities and basic mobility including household and community ambulation, transfers, walking her dogs, ADLs, IADLs, going to the beach, stairs, sleeping, carrying, lifting, bending, doing activities that require weight bearing without loss of balance, walking the dogs. 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  Patient denies hx of stroke, seizures, lung problems, diabetes, unexplained weight loss, unexplained changes in bowel or bladder problems, unexplained stumbling or dropping things,  osteoporosis, and spinal surgery   PAIN:  Are you having pain? Yes: NPRS scale: Current: 3-4/10,  Best: 0/10, Worst: 5/10. Pain location: right glute med Pain description: achy, numbness over the incision.  Aggravating factors: end of the day, being up on RW, moving around. Relieving factors: rest, muscle relaxers  FUNCTIONAL LIMITATIONS: difficulty with basic mobility including household and community ambulation, transfers, walking her dogs, ADLs, IADLs, going to the beach, stairs, sleeping, carrying, lifting, bending, doing activities that require weight bearing without loss of balance, walking the dogs.   PRECAUTIONS: Other: can transition to weight bearing as tolerated.  See protocol in chart.   WEIGHT BEARING RESTRICTIONS yes: can transition to weight bearing as tolerated.   FALLS:  Has patient fallen in last 6 months? No  LIVING ENVIRONMENT: Lives with: lives with their spouse Lives in: Townhouse, one 3 inch step in to slab.  Stairs: No Has following equipment at home: bed side commode and standard walker , R hip abduction brace.   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  LEISURE: walk her dogs, go up and down steps, go to the beach, ride in a convertible car, read a book.   PLOF: Independent  PATIENT GOALS: be able to walk dogs and go up and down steps and not hurt. Go back to daily activities without her hip aching and hurting.   OBJECTIVE  DIAGNOSTIC FINDINGS:  R hip MRI report from 10?10/2020: "  IMPRESSION: 1. No hip fracture, dislocation or avascular necrosis. 2. Mild tendinosis of the right gluteus minimus and medius tendon insertion. 3. Mild tendinosis of the left gluteus medius and minimus tendon insertion."  SELF- REPORTED FUNCTION FOTO score: 30/100 (upper leg questionnaire)  OBSERVATION/INSPECTION Posture Posture (seated): shifts away from right hip.  Anthropometrics Tremor: none Body composition: BMI 23.9 Muscle bulk:  grossly bilaterally Skin: The incision sites appear to be healing well with no excessive redness, warmth, drainage or signs of infection present.   Edema: none Functional Mobility Bed mobility: supine <> sit and rolling (over left hip) independent.  Transfers: sit <> stand mod I for increased time, offloading R LE, steadying on AD or furniture.  Gait: ambulates household and short community distance with standard walker with significant weight through B UE during R stance phase. Step to gait pattern.   NEUROLOGICAL Dermatomes L2-S2 appears equal and intact to light touch.   PERIPHERAL JOINT MOTION (in degrees)  PASSIVE RANGE OF MOTION (PROM) *Indicates pain 01/26/22 Date Date  Joint/Motion R/L R/L R/L  Hip     Flexion 137/135 / /  Extension  /20* / /  Abduction 45/40 / /  Adduction 25/30 / /  External rotation (prone) 70*/80 / /  Internal rotation (prone) 30*/45 / /  Comments:  01/26/2022: B knees and ankles grossly appear WFL.   MUSCLE PERFORMANCE (MMT):  *Indicates pain 01/26/22 Date Date  Joint/Motion R/L R/L R/L  Hip     Flexion (L1, L2) 3+/4+ / /  Extension (knee ext) 4/4+ / /  Abduction 3*/ / /  Adduction / / /  External rotation / / /  Internal rotation  / / /  Knee     Extension (L3) 5/5 / /  Flexion (S2) 5/5 / /  Ankle/Foot     Dorsiflexion (L4) 5/5 / /  Great toe extension (L5) 4+/4+ / /  Eversion (S1) 5/5 / /  Comments:    FUNCTIONAL/BALANCE TESTS: Single leg stance, firm surface, eyes open: R= 0 seconds, L= not tested.  TODAY'S TREATMENT:  education  PATIENT EDUCATION:  Education details: Education on diagnosis, prognosis, POC, anatomy and physiology of current condition.  Person educated: Patient Education method: Explanation Education comprehension: verbalized understanding and needs further education   HOME EXERCISE PROGRAM: TBD  ASSESSMENT:  CLINICAL IMPRESSION: Patient is a 56 y.o. female referred to outpatient physical therapy with a  medical diagnosis of tear of right gluteus medius tendon, trochanteric bursitis of both hips, enthesopathy of hip region on both sides who presents with signs and symptoms consistent with right hip pain, stiffness, weakness, dysfunction, imbalance, gait disturbance s/p OPEN REPAIR R GLUTEUS MEDIUS gluteal repair 12/05/2021 and . Patient presents with significant pain, ROM, posture, joint stiffness, tissue integrity, motor control, flexibility, gait, balance, muscle performance (strength/power/endurance) and activity tolerance impairments that are limiting ability to complete her usual activities and basic mobility including household and community ambulation, transfers, walking her dogs, ADLs, IADLs, going to the beach, stairs, sleeping, carrying, lifting, bending, doing activities that require weight bearing without loss of balance, walking the dogs without difficulty. Patient will benefit from skilled physical therapy intervention to address current body structure impairments and activity limitations to improve function and work towards goals set in current POC in order to return to prior level of function or maximal functional improvement.   OBJECTIVE IMPAIRMENTS Abnormal gait, decreased activity tolerance, decreased balance, decreased coordination, decreased endurance, decreased knowledge of use of DME, decreased mobility, difficulty walking, decreased  ROM, decreased strength, impaired perceived functional ability, impaired flexibility, improper body mechanics, postural dysfunction, and pain.   ACTIVITY LIMITATIONS carrying, lifting, bending, sitting, standing, squatting, sleeping, stairs, transfers, bed mobility, dressing, hygiene/grooming, locomotion level, and caring for others  PARTICIPATION LIMITATIONS: meal prep, cleaning, laundry, interpersonal relationship, driving, shopping, community activity, and   basic mobility including household and community ambulation, transfers, walking her dogs, ADLs,  IADLs, going to the beach, stairs, sleeping, carrying, lifting, bending, doing activities that require weight bearing without loss of balance, walking the dogs.  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   REHAB POTENTIAL: Good  CLINICAL DECISION MAKING: Stable/uncomplicated  EVALUATION COMPLEXITY: Low   GOALS: Goals reviewed with patient? No  SHORT TERM GOALS: Target date: 02/09/2022  Patient will be independent with initial home exercise program for self-management of symptoms. Baseline: Initial HEP to be provided at visit 2 as appropriate (01/26/22); Goal status: INITIAL   LONG TERM GOALS: Target date: 04/20/2022  Patient will be independent with a long-term home exercise program for self-management of symptoms.  Baseline: Initial HEP to be provided at visit 2 as appropriate (01/26/22); Goal status: INITIAL  2.  Patient will demonstrate improved FOTO to equal or greater than 54 by visit #16 to demonstrate improvement in overall condition and self-reported functional ability.  Baseline: 30 (01/26/22); Goal status: INITIAL  3.  Patient will demonstrate R sided single leg stance of equal or greater than 30 seconds with no trendelenburg compensation to decrease fall risk and improve household and community ambulation.  Baseline: 0 seconds (01/26/22); Goal status: INITIAL  4.  Patient will ambulate equal or greater than 1000 feet with no AD during 6 min walk test to improve community ambulation.  Baseline: not tested (01/26/22); Goal status: INITIAL  5.   Patient will complete community, work and/or recreational activities without limitation due to current condition.  Baseline: difficulty with usual activities including basic mobility including household and community ambulation, transfers, walking her dogs, ADLs, IADLs, going to the beach, stairs, sleeping, carrying, lifting, bending, doing activities that require weight bearing without loss of balance, walking the dogs (01/26/22); Goal status: INITIAL  6.  Patient will demonstrate B hip abduction MMT equal or greater than 4+/5 with no increase in pain to improve activity tolerance for walking her dog.  Baseline: R = 3/5 with pain, left not tested due to lack of tolerance for laying on R side (01/26/2022);   Goal status: INITIAL    PLAN: PT FREQUENCY: 1-2x/week  PT DURATION: 12 weeks  PLANNED INTERVENTIONS: Therapeutic exercises, Therapeutic activity, Neuromuscular re-education, Balance training, Gait training, Patient/Family education, Joint mobilization, Stair training, DME instructions, Aquatic Therapy, Dry Needling, Electrical stimulation, Spinal mobilization, Cryotherapy, Moist heat, Manual therapy, and Re-evaluation  PLAN FOR NEXT SESSION: update HEP as appropriate, progressive strengthening, ROM, gait training, and balance within protocol guidelines.    Everlean Alstrom. Graylon Good, PT, DPT 01/26/22, 8:06 PM  [facility] [address] [place], [place] [address] P: [phone] I F: [phone]

## 2022-01-30 ENCOUNTER — Ambulatory Visit: Payer: Managed Care, Other (non HMO) | Attending: Orthopedic Surgery | Admitting: Physical Therapy

## 2022-01-30 ENCOUNTER — Encounter: Payer: Self-pay | Admitting: Physical Therapy

## 2022-01-30 DIAGNOSIS — R2681 Unsteadiness on feet: Secondary | ICD-10-CM | POA: Insufficient documentation

## 2022-01-30 DIAGNOSIS — M25552 Pain in left hip: Secondary | ICD-10-CM | POA: Diagnosis present

## 2022-01-30 DIAGNOSIS — M6281 Muscle weakness (generalized): Secondary | ICD-10-CM | POA: Diagnosis present

## 2022-01-30 DIAGNOSIS — R262 Difficulty in walking, not elsewhere classified: Secondary | ICD-10-CM | POA: Insufficient documentation

## 2022-01-30 DIAGNOSIS — M25551 Pain in right hip: Secondary | ICD-10-CM | POA: Diagnosis not present

## 2022-01-30 NOTE — Therapy (Signed)
OUTPATIENT PHYSICAL THERAPY TREATMENT NOTE   Patient Name: Carolyn Esparza MRN: [id number] DOB:02-15-1966, 56 y.o., female Today's Date: 01/30/2022  PCP: [name], [name], MD REFERRING PROVIDER: Raelene Bott, PA  END OF SESSION:   PT End of Session - 01/30/22 1334     Visit Number 2    Number of Visits 24    Date for PT Re-Evaluation 04/20/22    Authorization Type [employer] reporting period from 01/26/2022    Authorization Time Period VL: max combined 60 PT/OT per year    Authorization - Visit Number 2    Authorization - Number of Visits 60    Progress Note Due on Visit 10    PT Start Time 0904    PT Stop Time 0945    PT Time Calculation (min) 41 min    Activity Tolerance Patient tolerated treatment well    Behavior During Therapy [facility] for tasks assessed/performed             Past Medical History:  Diagnosis Date   Anxiety    ASCUS with positive high risk HPV cervical    CAD (coronary artery disease)    Chicken pox    Chronic pain    neck, back, b/l hips    Chronic pain of right hip 10/31/4740   Complication of anesthesia    DDD (degenerative disc disease), lumbar 01/06/2021   Depression    Facet arthritis of lumbar region 01/06/2021   GERD (gastroesophageal reflux disease)    History of Crohn's disease    Hyperlipidemia    Left ovarian cyst    s/p removal of 1 ovary ? which one removed per pt    Libido, decreased    Skin cancer    reports skin cancer removed from skin in 2016 or 2017   Thyroid nodule    repeat US resolved and Ct neck 05/2019 normal thyroid    Past Surgical History:  Procedure Laterality Date   ABDOMINAL HYSTERECTOMY     APPENDECTOMY  2004   BLADDER SURGERY     BREAST BIOPSY     x 2   COLONOSCOPY     COLONOSCOPY WITH PROPOFOL N/A 10/25/2020   Procedure: COLONOSCOPY WITH PROPOFOL;  Surgeon: Mauri Pole, MD;  Location: [facility];  Service: Endoscopy;  Laterality: N/A;   HEMOSTASIS CLIP PLACEMENT  10/25/2020   Procedure:  HEMOSTASIS CLIP PLACEMENT;  Surgeon: Mauri Pole, MD;  Location: [facility];  Service: Endoscopy;;   OOPHORECTOMY Right    Unilateral Right (per MRI 04/17/2018)   POLYPECTOMY  10/25/2020   Procedure: POLYPECTOMY;  Surgeon: Mauri Pole, MD;  Location: [facility];  Service: Endoscopy;;   SUBMUCOSAL LIFTING INJECTION  10/25/2020   Procedure: SUBMUCOSAL LIFTING INJECTION;  Surgeon: Mauri Pole, MD;  Location: [facility];  Service: Endoscopy;;   WISDOM TOOTH EXTRACTION     Patient Active Problem List   Diagnosis Date Noted   Cervicalgia 07/01/2021   Low back pain 07/01/2021   Herpes infection 07/01/2021   Chronic pain of right hip 04/28/2021   DDD (degenerative disc disease), lumbar 01/06/2021   Facet arthritis of lumbar region 01/06/2021   Trochanteric bursitis of both hips 11/30/2020   Special screening for malignant neoplasms, colon    Polyp of cecum    Pain in both feet 04/12/2020   Burning sensation of feet 04/12/2020   Pain of both hip joints 04/12/2020   Onychomycosis 04/12/2020   Fibrocystic breast disease (FCBD) 04/12/2020   Numbness in both legs  09/09/2019   Spinal stenosis in cervical region 05/09/2019   Facet arthropathy, cervical 05/09/2019   Chronic pain 11/27/2018   Obesity (BMI 30.0-34.9) 11/27/2018   Sprain and strain of hip and thigh 05/10/2018   Abnormal MRI, cervical spine 03/28/2018   Menopause 11/26/2017   Right upper quadrant pain 11/26/2017   Abnormal Pap smear of cervix 07/27/2017   Vasomotor flushing 07/19/2017   Depression 07/18/2017   Nausea 06/25/2017   Chronic pain of both hips 06/25/2017   OSA (obstructive sleep apnea) 04/18/2017   Annual physical exam 04/10/2017   Fatigue 12/16/2016   Chronic back pain 05/30/2016   Abdominal pain 03/12/2016   Anxiety and depression 12/28/2015   History of obstructive sleep apnea 12/15/2015   GERD (gastroesophageal reflux disease) 12/15/2015   Hyperlipidemia 12/15/2015   Pain  medication agreement signed 03/25/2015   CAD in native artery 02/05/2015   Right supraspinatus tenosynovitis 08/17/2014   Crohn's disease involving terminal ileum ([place]) 03/30/2014   FH: colon polyps 03/30/2014   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 female who presents to outpatient physical therapy with a referral for medical diagnosis tear of right gluteus medius tendon, trochanteric bursitis of both hips, enthesopathy of hip region on both sides. This patient's chief complaints consist of right hip pain, stiffness, weakness, abnormal gait, difficulty with balance, and dysfunction s/p OPEN REPAIR R GLUTEUS MEDIUS gluteal repair 12/05/2021, leading to the following functional deficits: difficulty with usual activities and basic mobility including household and community ambulation, transfers, walking her dogs, ADLs, IADLs, going to the beach, stairs, sleeping, carrying, lifting, bending, doing activities that require weight bearing without loss of balance, walking the dogs. 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  Patient denies hx of stroke, seizures, lung problems, diabetes, unexplained  weight loss, unexplained changes in bowel or bladder problems, unexplained stumbling or dropping things, osteoporosis, and spinal surgery.  PRECAUTIONS: Other: can transition to weight bearing as tolerated.  See protocol in chart.  SUBJECTIVE: Patient reports she was a little sore after initial eval, but not unacceptable. She rates soreness in the right lateral hip of 3/10 upon arrival. She would like to try walking with the cane today.    PAIN:  Are you having pain? Yes 3/10 in right lateral hip.    OBJECTIVE:    TODAY'S TREATMENT:  Therapeutic exercise: to centralize symptoms and improve ROM, strength, muscular endurance, and activity tolerance required for successful completion of functional activities.  - NuStep level 3 using bilateral upper and lower extremities. Seat/handle setting 6/6. For improved extremity mobility, muscular endurance, and activity tolerance; and to induce the analgesic effect of aerobic exercise, stimulate improved joint nutrition, and prepare body structures and systems for following interventions. x 6  minutes. Average  SPM = 51. - ambulation with trial of various assistive devices including SPC, 1 and 2 axial crutches, RW. Has best control and gait pattern with RW.  - sit <> stand from 18 inch chair with no UE support, 1x10 - seated isometric hip abduction against strap, 1x20 with 4 second holds - supine R hip abduction AROM, 1x10 - supine R hip abduction against YTB loop at ankles, 1x6, 2x5 (unable to do more than 6 due to pain).  - quadruped alternating LE extension, 1x5 each side, 1x2 each side (discontinued due to pain at R lateral hip).  - hooklying bridge with isometric hip abduction against yellow theraband, 1x10 with 5 second hold.  - Education on HEP including handout   Pt required multimodal cuing for proper technique and to facilitate improved neuromuscular control, strength, range of motion, and functional ability resulting in improved performance and  form.    PATIENT EDUCATION:  Education details:  Exercise purpose/form. Self management techniques. HEP including handout  Person educated: Patient Education method: Explanation, demonstration, multimodal cuing.  Education comprehension: verbalized and demonstrated understanding and needs further education     HOME EXERCISE PROGRAM: Access Code: [email or web address] URL: [email or web address] Date: 01/30/2022 Prepared by: Rosita Kea  Exercises - Sit to Stand Without Arm Support  - 1 x daily - 1-3 sets - 10 reps - Seated Isometric Hip Abduction with Belt  - 1-2 x daily - 1 sets - 20 reps - 5 seconds hold - Seated Hip Adduction Isometrics with Ball  - 1-2 x daily - 1 sets - 20 reps - 5 seconds hold - Supine Single Leg Hip Abduction with Resistance at Ankles  - 1-2 x daily - 1-3 sets - 5 reps - Supine Bridge with Resistance Band  - 1-2 x daily - 1 sets - 20 reps - 5 seconds hold     ASSESSMENT:   CLINICAL IMPRESSION: Patient tolerated treatment well overall and was able to participate in exercises with no lasting increased pain. Session focused on updating AD to most appropriate for level of recovery and establishing initial HEP. Patient fatigues quickly and R glute med and continues to need support for ambulation. RW recommended as most appropriate AD at this point. Plan to continue with tolerated hip exercises next session within protocol and as appropriate. Patient would benefit from continued management of limiting condition by skilled physical therapist to address remaining impairments and functional limitations to work towards stated goals and return to PLOF or maximal functional independence.   Patient is a 56 y.o. female referred to outpatient physical therapy with a medical diagnosis of tear of right gluteus medius tendon, trochanteric bursitis of both hips, enthesopathy of hip region on both sides who presents with signs and symptoms consistent with right hip pain, stiffness,  weakness, dysfunction, imbalance, gait disturbance s/p OPEN REPAIR R GLUTEUS MEDIUS gluteal repair 12/05/2021 and . Patient presents with significant pain, ROM, posture, joint stiffness, tissue integrity, motor control, flexibility, gait, balance, muscle performance (strength/power/endurance) and activity tolerance impairments that are limiting ability to complete her usual activities and basic mobility including household and community ambulation, transfers, walking her dogs, ADLs, IADLs, going to the beach, stairs, sleeping, carrying, lifting, bending, doing activities that require weight bearing without loss of balance, walking the dogs without difficulty. Patient will benefit from skilled physical therapy intervention to address current body structure impairments and activity limitations to improve function and work towards goals set in current POC in order to return to prior level of function or  maximal functional improvement.    OBJECTIVE IMPAIRMENTS Abnormal gait, decreased activity tolerance, decreased balance, decreased coordination, decreased endurance, decreased knowledge of use of DME, decreased mobility, difficulty walking, decreased ROM, decreased strength, impaired perceived functional ability, impaired flexibility, improper body mechanics, postural dysfunction, and pain.    ACTIVITY LIMITATIONS carrying, lifting, bending, sitting, standing, squatting, sleeping, stairs, transfers, bed mobility, dressing, hygiene/grooming, locomotion level, and caring for others   PARTICIPATION LIMITATIONS: meal prep, cleaning, laundry, interpersonal relationship, driving, shopping, community activity, and   basic mobility including household and community ambulation, transfers, walking her dogs, ADLs, IADLs, going to the beach, stairs, sleeping, carrying, lifting, bending, doing activities that require weight bearing without loss of balance, walking the dogs.   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    REHAB POTENTIAL: Good   CLINICAL DECISION MAKING: Stable/uncomplicated   EVALUATION COMPLEXITY: Low     GOALS: Goals reviewed with patient? No   SHORT TERM GOALS: Target date: 02/09/2022   Patient will be independent with initial home exercise program for self-management of symptoms. Baseline: Initial HEP to be provided at visit 2 as appropriate (01/26/22); Goal status: In-progress     LONG TERM GOALS: Target date: 04/20/2022   Patient will be independent with a long-term home exercise program for self-management of symptoms.  Baseline: Initial HEP to be provided at visit 2 as appropriate (01/26/22); Goal status: In-progress   2.  Patient will demonstrate improved FOTO to equal or greater than 54 by visit #16 to demonstrate improvement in overall condition and self-reported functional ability.  Baseline: 30 (01/26/22); Goal status: In-progress   3.  Patient will demonstrate R sided single leg stance of equal or greater than 30 seconds with no trendelenburg compensation to decrease fall risk and improve household and community ambulation.  Baseline: 0 seconds (01/26/22); Goal status: In-progress   4.  Patient will ambulate equal or greater than 1000 feet with no AD during 6 min walk test to improve community ambulation.  Baseline: not tested (01/26/22); Goal status: In-progress   5.  Patient will complete community, work and/or recreational activities without limitation due to current condition.  Baseline: difficulty with usual activities including basic  mobility including household and community ambulation, transfers, walking her dogs, ADLs, IADLs, going to the beach, stairs, sleeping, carrying, lifting, bending, doing activities that require weight bearing without loss of balance, walking the dogs (01/26/22); Goal status: In-progress   6.  Patient will demonstrate B hip abduction MMT equal or greater than 4+/5 with no increase in pain to improve activity tolerance for walking her dog.  Baseline: R = 3/5 with pain, left not tested due to lack of tolerance for laying on R side (01/26/2022);   Goal status: In-progress       PLAN: PT FREQUENCY: 1-2x/week   PT DURATION: 12 weeks   PLANNED INTERVENTIONS: Therapeutic exercises, Therapeutic activity, Neuromuscular re-education, Balance training, Gait training, Patient/Family education, Joint mobilization, Stair training, DME instructions, Aquatic Therapy, Dry Needling, Electrical stimulation, Spinal mobilization, Cryotherapy, Moist heat, Manual therapy, and Re-evaluation   PLAN FOR NEXT SESSION: update HEP as appropriate, progressive strengthening, ROM, gait training, and balance within protocol guidelines.  Everlean Alstrom. Graylon Good, PT, DPT 01/30/22, 1:43 PM  [facility] [address] [place], [place] [address] P: [phone] I F: [phone]

## 2022-02-02 ENCOUNTER — Ambulatory Visit: Payer: Managed Care, Other (non HMO) | Admitting: Physical Therapy

## 2022-02-02 ENCOUNTER — Encounter: Payer: Self-pay | Admitting: Physical Therapy

## 2022-02-02 DIAGNOSIS — R2681 Unsteadiness on feet: Secondary | ICD-10-CM

## 2022-02-02 DIAGNOSIS — R262 Difficulty in walking, not elsewhere classified: Secondary | ICD-10-CM

## 2022-02-02 DIAGNOSIS — M25552 Pain in left hip: Secondary | ICD-10-CM

## 2022-02-02 DIAGNOSIS — M25551 Pain in right hip: Secondary | ICD-10-CM

## 2022-02-02 DIAGNOSIS — M6281 Muscle weakness (generalized): Secondary | ICD-10-CM

## 2022-02-02 NOTE — Therapy (Signed)
OUTPATIENT PHYSICAL THERAPY TREATMENT NOTE   Patient Name: Carolyn Esparza MRN: [id number] DOB:Jun 29, 1966, 56 y.o., female Today's Date: 02/02/2022  PCP: [name], [name], MD REFERRING PROVIDER: Raelene Bott, PA  END OF SESSION:   PT End of Session - 02/02/22 0951     Visit Number 3    Number of Visits 24    Date for PT Re-Evaluation 04/20/22    Authorization Type [employer] reporting period from 01/26/2022    Authorization Time Period VL: max combined 60 PT/OT per year    Authorization - Visit Number 3    Authorization - Number of Visits 60    Progress Note Due on Visit 10    PT Start Time 0945    PT Stop Time 1025    PT Time Calculation (min) 40 min    Activity Tolerance Patient tolerated treatment well    Behavior During Therapy WFL for tasks assessed/performed              Past Medical History:  Diagnosis Date   Anxiety    ASCUS with positive high risk HPV cervical    CAD (coronary artery disease)    Chicken pox    Chronic pain    neck, back, b/l hips    Chronic pain of right hip 3/55/7322   Complication of anesthesia    DDD (degenerative disc disease), lumbar 01/06/2021   Depression    Facet arthritis of lumbar region 01/06/2021   GERD (gastroesophageal reflux disease)    History of Crohn's disease    Hyperlipidemia    Left ovarian cyst    s/p removal of 1 ovary ? which one removed per pt    Libido, decreased    Skin cancer    reports skin cancer removed from skin in 2016 or 2017   Thyroid nodule    repeat US resolved and Ct neck 05/2019 normal thyroid    Past Surgical History:  Procedure Laterality Date   ABDOMINAL HYSTERECTOMY     APPENDECTOMY  2004   BLADDER SURGERY     BREAST BIOPSY     x 2   COLONOSCOPY     COLONOSCOPY WITH PROPOFOL N/A 10/25/2020   Procedure: COLONOSCOPY WITH PROPOFOL;  Surgeon: Mauri Pole, MD;  Location: [facility];  Service: Endoscopy;  Laterality: N/A;   HEMOSTASIS CLIP PLACEMENT  10/25/2020   Procedure:  HEMOSTASIS CLIP PLACEMENT;  Surgeon: Mauri Pole, MD;  Location: [facility];  Service: Endoscopy;;   OOPHORECTOMY Right    Unilateral Right (per MRI 04/17/2018)   POLYPECTOMY  10/25/2020   Procedure: POLYPECTOMY;  Surgeon: Mauri Pole, MD;  Location: [facility];  Service: Endoscopy;;   SUBMUCOSAL LIFTING INJECTION  10/25/2020   Procedure: SUBMUCOSAL LIFTING INJECTION;  Surgeon: Mauri Pole, MD;  Location: [facility];  Service: Endoscopy;;   WISDOM TOOTH EXTRACTION     Patient Active Problem List   Diagnosis Date Noted   Cervicalgia 07/01/2021   Low back pain 07/01/2021   Herpes infection 07/01/2021   Chronic pain of right hip 04/28/2021   DDD (degenerative disc disease), lumbar 01/06/2021   Facet arthritis of lumbar region 01/06/2021   Trochanteric bursitis of both hips 11/30/2020   Special screening for malignant neoplasms, colon    Polyp of cecum    Pain in both feet 04/12/2020   Burning sensation of feet 04/12/2020   Pain of both hip joints 04/12/2020   Onychomycosis 04/12/2020   Fibrocystic breast disease (FCBD) 04/12/2020   Numbness in both  legs 09/09/2019   Spinal stenosis in cervical region 05/09/2019   Facet arthropathy, cervical 05/09/2019   Chronic pain 11/27/2018   Obesity (BMI 30.0-34.9) 11/27/2018   Sprain and strain of hip and thigh 05/10/2018   Abnormal MRI, cervical spine 03/28/2018   Menopause 11/26/2017   Right upper quadrant pain 11/26/2017   Abnormal Pap smear of cervix 07/27/2017   Vasomotor flushing 07/19/2017   Depression 07/18/2017   Nausea 06/25/2017   Chronic pain of both hips 06/25/2017   OSA (obstructive sleep apnea) 04/18/2017   Annual physical exam 04/10/2017   Fatigue 12/16/2016   Chronic back pain 05/30/2016   Abdominal pain 03/12/2016   Anxiety and depression 12/28/2015   History of obstructive sleep apnea 12/15/2015   GERD (gastroesophageal reflux disease) 12/15/2015   Hyperlipidemia 12/15/2015   Pain  medication agreement signed 03/25/2015   CAD in native artery 02/05/2015   Right supraspinatus tenosynovitis 08/17/2014   Crohn's disease involving terminal ileum ([place]) 03/30/2014   FH: colon polyps 03/30/2014   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 female who presents to outpatient physical therapy with a referral for medical diagnosis tear of right gluteus medius tendon, trochanteric bursitis of both hips, enthesopathy of hip region on both sides. This patient's chief complaints consist of right hip pain, stiffness, weakness, abnormal gait, difficulty with balance, and dysfunction s/p OPEN REPAIR R GLUTEUS MEDIUS gluteal repair 12/05/2021, leading to the following functional deficits: difficulty with usual activities and basic mobility including household and community ambulation, transfers, walking her dogs, ADLs, IADLs, going to the beach, stairs, sleeping, carrying, lifting, bending, doing activities that require weight bearing without loss of balance, walking the dogs. 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  Patient denies hx of stroke, seizures, lung problems, diabetes, unexplained  weight loss, unexplained changes in bowel or bladder problems, unexplained stumbling or dropping things, osteoporosis, and spinal surgery.  PRECAUTIONS: Other: can transition to weight bearing as tolerated.  See protocol in chart.  SUBJECTIVE: Patient reports she was a little sore after last session in her right hip and low back but felt this was acceptable to her. She currently has pain she rates as 4/10 "not bad" at the right hip. She could not find wheels for her standard walker and is still using the standard walker today. Her HEP went pretty good. She did all the reps for all the exercises and was a little sore so she just did one set on everything. She is sleeping "so so" but better than right after the surgery. She does not think her sleep difficulties currently have anything to do with her hip.   PAIN:  Are you having pain? Yes 4/10 in right lateral hip.    OBJECTIVE:  TODAY'S TREATMENT:  Therapeutic exercise: to centralize symptoms and improve ROM, strength, muscular endurance, and activity tolerance required for successful completion of functional activities.  - NuStep level 4 using bilateral upper and lower extremities. Seat/handle setting 6/6. For improved extremity mobility, muscular endurance, and activity tolerance; and to induce the analgesic effect of aerobic exercise, stimulate improved joint nutrition, and prepare body structures and systems for following interventions. x 6  minutes. Average SPM = 60. - standing left foot taps on 8.5 inch platform with B UE support, 1x5 (painful R hip). - standing R hip abduction with B UE support, 1x10 - standing R hip flexion, knee extended, 1x10 - standing R hip circles with knee extended, 1x5 CW.  - standing B heel raises, 2x10 with B UE support.  - standing R hip circles with knee extended, 2x10 CW. VC decreased pelvic movement.  - standing hip adductor strengthening with R knee on rolling stool, rolling it laterally away from body and  then back, BUE support, 3x10. - standing R hip circles with knee extended, 3x10 CCW. VC to keep toe forward.  - quadruped alternating LE extension, 3x10 each side, L foot sliding on table to decrease load on R lateral hip to tolerable level, R foot in air. Rest in child's pose between sets on each side.  - left sidelying R hip abduction with short arm (knee flexed) from support on red bolster, had cramping at rep 3 and had to stop.  - hooklying bridge with isometric hip abduction against GTB, 1x10, 1x7 with 5 second hold (discontinued due to pain/cramping).  - child's pose stretch Pt required multimodal cuing for proper technique and to facilitate improved neuromuscular control, strength, range of motion, and functional ability resulting in improved performance and form.    PATIENT EDUCATION:  Education details:  Exercise purpose/form. Self management techniques. HEP including handout  Reviewed cancelation/no-show policy with patient and confirmed patient has correct phone number for clinic; patient verbalized understanding (02/02/22). Person educated: Patient Education method: Explanation, demonstration, multimodal cuing.  Education comprehension: verbalized and demonstrated understanding and needs further education     HOME EXERCISE PROGRAM: Access Code: [email or web address] URL: [email or web address] Date: 01/30/2022 Prepared by: Rosita Kea  Exercises - Sit to Stand Without Arm Support  - 1 x daily - 1-3 sets - 10 reps - Seated Isometric Hip Abduction with Belt  - 1-2 x daily - 1 sets - 20 reps - 5 seconds hold - Seated Hip Adduction Isometrics with Ball  - 1-2 x daily - 1 sets - 20 reps - 5 seconds hold - Supine Single Leg Hip Abduction with Resistance at Ankles  - 1-2 x daily - 1-3 sets - 5 reps - Supine Bridge with Resistance Band  - 1-2 x daily - 1 sets - 20 reps - 5 seconds hold     ASSESSMENT:   CLINICAL IMPRESSION: Patient tolerated treatment well overall with  modifications and frequent rest to prevent excessive soreness at the right lateral hip. Patient was unable to tolerate single leg exercises while standing on R LE so exercises were completed with R OKC and modified R weight bearing. Had the most trouble with R hip abduction with light ER, but was able to tolerate bridges with mostly isometric abduction against GTB. Patient would benefit from continued management of limiting condition by skilled physical therapist to address remaining impairments and functional limitations to work towards stated goals and return to PLOF or maximal functional independence.   Patient is a 56 y.o. female  referred to outpatient physical therapy with a medical diagnosis of tear of right gluteus medius tendon, trochanteric bursitis of both hips, enthesopathy of hip region on both sides who presents with signs and symptoms consistent with right hip pain, stiffness, weakness, dysfunction, imbalance, gait disturbance s/p OPEN REPAIR R GLUTEUS MEDIUS gluteal repair 12/05/2021 and . Patient presents with significant pain, ROM, posture, joint stiffness, tissue integrity, motor control, flexibility, gait, balance, muscle performance (strength/power/endurance) and activity tolerance impairments that are limiting ability to complete her usual activities and basic mobility including household and community ambulation, transfers, walking her dogs, ADLs, IADLs, going to the beach, stairs, sleeping, carrying, lifting, bending, doing activities that require weight bearing without loss of balance, walking the dogs without difficulty. Patient will benefit from skilled physical therapy intervention to address current body structure impairments and activity limitations to improve function and work towards goals set in current POC in order to return to prior level of function or maximal functional improvement.    OBJECTIVE IMPAIRMENTS Abnormal gait, decreased activity tolerance, decreased balance,  decreased coordination, decreased endurance, decreased knowledge of use of DME, decreased mobility, difficulty walking, decreased ROM, decreased strength, impaired perceived functional ability, impaired flexibility, improper body mechanics, postural dysfunction, and pain.    ACTIVITY LIMITATIONS carrying, lifting, bending, sitting, standing, squatting, sleeping, stairs, transfers, bed mobility, dressing, hygiene/grooming, locomotion level, and caring for others   PARTICIPATION LIMITATIONS: meal prep, cleaning, laundry, interpersonal relationship, driving, shopping, community activity, and   basic mobility including household and community ambulation, transfers, walking her dogs, ADLs, IADLs, going to the beach, stairs, sleeping, carrying, lifting, bending, doing activities that require weight bearing without loss of balance, walking the dogs.   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    REHAB POTENTIAL: Good   CLINICAL DECISION MAKING: Stable/uncomplicated   EVALUATION COMPLEXITY: Low     GOALS: Goals reviewed with patient? No   SHORT TERM GOALS: Target date: 02/09/2022   Patient will be independent with initial home exercise program for self-management of symptoms. Baseline: Initial HEP to be provided at visit 2 as appropriate (01/26/22); Goal status: In-progress     LONG TERM GOALS: Target date: 04/20/2022   Patient will be independent with a long-term home exercise program for self-management of symptoms.  Baseline: Initial HEP to be provided at visit 2 as  appropriate (01/26/22); Goal status: In-progress   2.  Patient will demonstrate improved FOTO to equal or greater than 54 by visit #16 to demonstrate improvement in overall condition and self-reported functional ability.  Baseline: 30 (01/26/22); Goal status: In-progress   3.  Patient will demonstrate R sided single leg stance of equal or greater than 30 seconds with no trendelenburg compensation to decrease fall risk and improve household and community ambulation.  Baseline: 0 seconds (01/26/22); Goal status: In-progress   4.  Patient will ambulate equal or greater than 1000 feet with no AD during 6 min walk test to improve community ambulation.  Baseline: not tested (01/26/22); Goal status: In-progress   5.  Patient will complete community, work and/or recreational activities without limitation due to current condition.  Baseline: difficulty with usual activities including basic mobility including  household and community ambulation, transfers, walking her dogs, ADLs, IADLs, going to the beach, stairs, sleeping, carrying, lifting, bending, doing activities that require weight bearing without loss of balance, walking the dogs (01/26/22); Goal status: In-progress   6.  Patient will demonstrate B hip abduction MMT equal or greater than 4+/5 with no increase in pain to improve activity tolerance for walking her dog.  Baseline: R = 3/5 with pain, left not tested due to lack of tolerance for laying on R side (01/26/2022);   Goal status: In-progress       PLAN: PT FREQUENCY: 1-2x/week   PT DURATION: 12 weeks   PLANNED INTERVENTIONS: Therapeutic exercises, Therapeutic activity, Neuromuscular re-education, Balance training, Gait training, Patient/Family education, Joint mobilization, Stair training, DME instructions, Aquatic Therapy, Dry Needling, Electrical stimulation, Spinal mobilization, Cryotherapy, Moist heat, Manual therapy, and Re-evaluation   PLAN FOR NEXT SESSION: update HEP as  appropriate, progressive strengthening, ROM, gait training, and balance within protocol guidelines. Work towards ability to tolerate R SLS.    Everlean Alstrom. Graylon Good, PT, DPT 02/02/22, 10:27 AM  [facility] [address] [place], [place] [address] P: [phone] I F: [phone]

## 2022-02-06 ENCOUNTER — Ambulatory Visit: Payer: Managed Care, Other (non HMO) | Admitting: Physical Therapy

## 2022-02-06 ENCOUNTER — Encounter: Payer: Managed Care, Other (non HMO) | Admitting: Physical Therapy

## 2022-02-06 ENCOUNTER — Encounter: Payer: Self-pay | Admitting: Physical Therapy

## 2022-02-06 DIAGNOSIS — M25552 Pain in left hip: Secondary | ICD-10-CM

## 2022-02-06 DIAGNOSIS — M25551 Pain in right hip: Secondary | ICD-10-CM | POA: Diagnosis not present

## 2022-02-06 DIAGNOSIS — R262 Difficulty in walking, not elsewhere classified: Secondary | ICD-10-CM

## 2022-02-06 DIAGNOSIS — M6281 Muscle weakness (generalized): Secondary | ICD-10-CM

## 2022-02-06 DIAGNOSIS — R2681 Unsteadiness on feet: Secondary | ICD-10-CM

## 2022-02-06 NOTE — Therapy (Addendum)
OUTPATIENT PHYSICAL THERAPY TREATMENT NOTE   Patient Name: Carolyn Esparza MRN: [id number] DOB:March 14, 1966, 56 y.o., female Today's Date: 02/06/2022  PCP: [name], [name], MD REFERRING PROVIDER: Raelene Bott, PA  END OF SESSION:   PT End of Session - 02/06/22 1359     Visit Number 4    Number of Visits 24    Date for PT Re-Evaluation 04/20/22    Authorization Type [employer] reporting period from 01/26/2022    Authorization Time Period VL: max combined 60 PT/OT per year    Authorization - Visit Number 4    Authorization - Number of Visits 60    Progress Note Due on Visit 10    PT Start Time 8657    PT Stop Time 1429    PT Time Calculation (min) 38 min    Activity Tolerance Patient tolerated treatment well    Behavior During Therapy WFL for tasks assessed/performed               Past Medical History:  Diagnosis Date   Anxiety    ASCUS with positive high risk HPV cervical    CAD (coronary artery disease)    Chicken pox    Chronic pain    neck, back, b/l hips    Chronic pain of right hip 8/46/9629   Complication of anesthesia    DDD (degenerative disc disease), lumbar 01/06/2021   Depression    Facet arthritis of lumbar region 01/06/2021   GERD (gastroesophageal reflux disease)    History of Crohn's disease    Hyperlipidemia    Left ovarian cyst    s/p removal of 1 ovary ? which one removed per pt    Libido, decreased    Skin cancer    reports skin cancer removed from skin in 2016 or 2017   Thyroid nodule    repeat US resolved and Ct neck 05/2019 normal thyroid    Past Surgical History:  Procedure Laterality Date   ABDOMINAL HYSTERECTOMY     APPENDECTOMY  2004   BLADDER SURGERY     BREAST BIOPSY     x 2   COLONOSCOPY     COLONOSCOPY WITH PROPOFOL N/A 10/25/2020   Procedure: COLONOSCOPY WITH PROPOFOL;  Surgeon: Mauri Pole, MD;  Location: [facility];  Service: Endoscopy;  Laterality: N/A;   HEMOSTASIS CLIP PLACEMENT  10/25/2020   Procedure:  HEMOSTASIS CLIP PLACEMENT;  Surgeon: Mauri Pole, MD;  Location: [facility];  Service: Endoscopy;;   OOPHORECTOMY Right    Unilateral Right (per MRI 04/17/2018)   POLYPECTOMY  10/25/2020   Procedure: POLYPECTOMY;  Surgeon: Mauri Pole, MD;  Location: [facility];  Service: Endoscopy;;   SUBMUCOSAL LIFTING INJECTION  10/25/2020   Procedure: SUBMUCOSAL LIFTING INJECTION;  Surgeon: Mauri Pole, MD;  Location: [facility];  Service: Endoscopy;;   WISDOM TOOTH EXTRACTION     Patient Active Problem List   Diagnosis Date Noted   Cervicalgia 07/01/2021   Low back pain 07/01/2021   Herpes infection 07/01/2021   Chronic pain of right hip 04/28/2021   DDD (degenerative disc disease), lumbar 01/06/2021   Facet arthritis of lumbar region 01/06/2021   Trochanteric bursitis of both hips 11/30/2020   Special screening for malignant neoplasms, colon    Polyp of cecum    Pain in both feet 04/12/2020   Burning sensation of feet 04/12/2020   Pain of both hip joints 04/12/2020   Onychomycosis 04/12/2020   Fibrocystic breast disease (FCBD) 04/12/2020   Numbness in  both legs 09/09/2019   Spinal stenosis in cervical region 05/09/2019   Facet arthropathy, cervical 05/09/2019   Chronic pain 11/27/2018   Obesity (BMI 30.0-34.9) 11/27/2018   Sprain and strain of hip and thigh 05/10/2018   Abnormal MRI, cervical spine 03/28/2018   Menopause 11/26/2017   Right upper quadrant pain 11/26/2017   Abnormal Pap smear of cervix 07/27/2017   Vasomotor flushing 07/19/2017   Depression 07/18/2017   Nausea 06/25/2017   Chronic pain of both hips 06/25/2017   OSA (obstructive sleep apnea) 04/18/2017   Annual physical exam 04/10/2017   Fatigue 12/16/2016   Chronic back pain 05/30/2016   Abdominal pain 03/12/2016   Anxiety and depression 12/28/2015   History of obstructive sleep apnea 12/15/2015   GERD (gastroesophageal reflux disease) 12/15/2015   Hyperlipidemia 12/15/2015   Pain  medication agreement signed 03/25/2015   CAD in native artery 02/05/2015   Right supraspinatus tenosynovitis 08/17/2014   Crohn's disease involving terminal ileum ([place]) 03/30/2014   FH: colon polyps 03/30/2014   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 female who presents to outpatient physical therapy with a referral for medical diagnosis tear of right gluteus medius tendon, trochanteric bursitis of both hips, enthesopathy of hip region on both sides. This patient's chief complaints consist of right hip pain, stiffness, weakness, abnormal gait, difficulty with balance, and dysfunction s/p OPEN REPAIR R GLUTEUS MEDIUS gluteal repair 12/05/2021, leading to the following functional deficits: difficulty with usual activities and basic mobility including household and community ambulation, transfers, walking her dogs, ADLs, IADLs, going to the beach, stairs, sleeping, carrying, lifting, bending, doing activities that require weight bearing without loss of balance, walking the dogs. 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  Patient denies hx of stroke, seizures, lung problems, diabetes, unexplained  weight loss, unexplained changes in bowel or bladder problems, unexplained stumbling or dropping things, osteoporosis, and spinal surgery.  PRECAUTIONS: Other: can transition to weight bearing as tolerated.  See protocol in chart.  SUBJECTIVE: Patient arrives with RW and improved gait pattern. She states she recovered well from last PT session. She is anxious to get off the walker.   PAIN:  Are you having pain? no   OBJECTIVE:    TODAY'S TREATMENT:  Therapeutic exercise: to centralize symptoms and improve ROM, strength, muscular endurance, and activity tolerance required for successful completion of functional activities.  - NuStep level 5 using bilateral upper and lower extremities. Seat/handle setting 6/6. For improved extremity mobility, muscular endurance, and activity tolerance; and to induce the analgesic effect of aerobic exercise, stimulate improved joint nutrition, and prepare body structures and systems for following interventions. x 6  minutes. Average SPM = 61. - ambulation with RW focusing on improving gait pattern with less UE  support, 2x30 feet.   Circuit: - standing left foot taps on 8.5 inch platform with B UE support, 3x10 (uncomfortable R hip). - standing R hip flexion, knee extended, 3x10  - R SLS with B UE support (double finger each hand), 1x10 with 5 second holds. - standing R hip abduction with isometric hold against 40# KB while using contralateral U UE support and self palpation at R pelvis to prevent pelvic compensation, 1x10 with 5 second holds - ambulation with RW focusing on improving gait pattern with less UE support, 1x60 feet.  - ambulation with SPC in L UE, 1x30 feet with VC/TC to improve gait pattern. (still feels more stable with RW).  - quadruped alternating LE extension, 3x10 each side.  Rest in child's pose between sets on each side.   Pt required multimodal cuing for proper technique and to facilitate improved neuromuscular control, strength, range  of motion, and functional ability resulting in improved performance and form.    PATIENT EDUCATION:  Education details:  Exercise purpose/form. Self management techniques. HEP including handout  Reviewed cancelation/no-show policy with patient and confirmed patient has correct phone number for clinic; patient verbalized understanding (02/02/22). Person educated: Patient Education method: Explanation, demonstration, multimodal cuing.  Education comprehension: verbalized and demonstrated understanding and needs further education     HOME EXERCISE PROGRAM: Access Code: [email or web address] URL: [email or web address] Date: 01/30/2022 Prepared by: Rosita Kea  Exercises - Sit to Stand Without Arm Support  - 1 x daily - 1-3 sets - 10 reps - Seated Isometric Hip Abduction with Belt  - 1-2 x daily - 1 sets - 20 reps - 5 seconds hold - Seated Hip Adduction Isometrics with Ball  - 1-2 x daily - 1 sets - 20 reps - 5 seconds hold - Supine Single Leg Hip Abduction with Resistance at Ankles  - 1-2 x daily - 1-3 sets - 5 reps - Supine Bridge with Resistance Band  - 1-2 x daily - 1 sets - 20 reps - 5 seconds hold     ASSESSMENT:   CLINICAL IMPRESSION: Patient tolerated treatment well overall with mild soreness/cramping at the right lateral hip that decreased with rest. Patient eager to get off walker so interventions focused on working towards improved tolerance for single leg stance and improving gait mechanics with use of SPC and RW with less use of B UE. Patinet continues to experience cramping, fatigue, and pain with too much use of R glute med. She appears to be ready to start transition to [facility] soon. Patient would benefit from continued management of limiting condition by skilled physical therapist to address remaining impairments and functional limitations to work towards stated goals and return to PLOF or maximal functional independence.   Patient is a 56 y.o. female referred to outpatient  physical therapy with a medical diagnosis of tear of right gluteus medius tendon, trochanteric bursitis of both hips, enthesopathy of hip region on both sides who presents with signs and symptoms consistent with right hip pain, stiffness, weakness, dysfunction, imbalance, gait disturbance s/p OPEN REPAIR R GLUTEUS MEDIUS gluteal repair 12/05/2021 and . Patient presents with significant pain, ROM, posture, joint stiffness, tissue integrity, motor control, flexibility, gait, balance, muscle performance (strength/power/endurance) and activity tolerance impairments that are limiting ability to complete her usual activities and basic mobility including household and community ambulation, transfers, walking her dogs, ADLs, IADLs, going to the beach, stairs, sleeping, carrying, lifting, bending, doing activities that require weight bearing without loss of balance, walking the dogs without difficulty.  Patient will benefit from skilled physical therapy intervention to address current body structure impairments and activity limitations to improve function and work towards goals set in current POC in order to return to prior level of function or maximal functional improvement.    OBJECTIVE IMPAIRMENTS Abnormal gait, decreased activity tolerance, decreased balance, decreased coordination, decreased endurance, decreased knowledge of use of DME, decreased mobility, difficulty walking, decreased ROM, decreased strength, impaired perceived functional ability, impaired flexibility, improper body mechanics, postural dysfunction, and pain.    ACTIVITY LIMITATIONS carrying, lifting, bending, sitting, standing, squatting, sleeping, stairs, transfers, bed mobility, dressing, hygiene/grooming, locomotion level, and caring for others   PARTICIPATION LIMITATIONS: meal prep, cleaning, laundry, interpersonal relationship, driving, shopping, community activity, and   basic mobility including household and community ambulation, transfers,  walking her dogs, ADLs, IADLs, going to the beach, stairs, sleeping, carrying, lifting, bending, doing activities that require weight bearing without loss of balance, walking the dogs.   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    REHAB POTENTIAL: Good   CLINICAL DECISION MAKING: Stable/uncomplicated   EVALUATION COMPLEXITY: Low     GOALS: Goals reviewed with patient? No   SHORT TERM GOALS: Target date: 02/09/2022   Patient will be independent with initial home exercise program for self-management of symptoms. Baseline: Initial HEP to be provided at visit 2 as appropriate (01/26/22); Goal status: In-progress     LONG TERM GOALS: Target date: 04/20/2022   Patient will be independent with a long-term home exercise program for self-management of symptoms.  Baseline: Initial HEP to be provided at visit 2 as appropriate (01/26/22); Goal status: In-progress   2.  Patient will demonstrate improved FOTO to equal or greater than 54 by visit #16 to demonstrate improvement in overall condition and self-reported functional ability.  Baseline: 30 (01/26/22); Goal status: In-progress   3.  Patient will demonstrate R sided single leg stance of equal or greater than 30 seconds with no trendelenburg compensation to decrease fall risk and improve household and community ambulation.  Baseline: 0 seconds (01/26/22); Goal status: In-progress   4.  Patient will ambulate equal or greater than 1000 feet with no AD during 6 min walk test to improve community ambulation.  Baseline:  not tested (01/26/22); Goal status: In-progress   5.  Patient will complete community, work and/or recreational activities without limitation due to current condition.  Baseline: difficulty with usual activities including basic mobility including household and community ambulation, transfers, walking her dogs, ADLs, IADLs, going to the beach, stairs, sleeping, carrying, lifting, bending, doing activities that require weight bearing without loss of balance, walking the dogs (01/26/22); Goal status: In-progress   6.  Patient will demonstrate B hip abduction MMT equal or greater than 4+/5 with no increase in pain to improve activity tolerance for walking her dog.  Baseline: R = 3/5 with pain, left not tested due to lack of tolerance for laying on R side (01/26/2022);   Goal status: In-progress       PLAN: PT FREQUENCY: 1-2x/week   PT DURATION: 12 weeks   PLANNED INTERVENTIONS: Therapeutic exercises, Therapeutic activity, Neuromuscular re-education, Balance training, Gait training, Patient/Family education, Joint mobilization, Stair  training, DME instructions, Aquatic Therapy, Dry Needling, Electrical stimulation, Spinal mobilization, Cryotherapy, Moist heat, Manual therapy, and Re-evaluation   PLAN FOR NEXT SESSION: update HEP as appropriate, progressive strengthening, ROM, gait training, and balance within protocol guidelines. Work towards ability to tolerate R SLS.    Everlean Alstrom. Graylon Good, PT, DPT 02/06/22, 2:32 PM  [facility] [address] [place], [place] [address] P: [phone] I F: [phone]

## 2022-02-08 ENCOUNTER — Ambulatory Visit: Payer: Managed Care, Other (non HMO) | Admitting: Physical Therapy

## 2022-02-08 ENCOUNTER — Encounter: Payer: Self-pay | Admitting: Physical Therapy

## 2022-02-08 DIAGNOSIS — M25551 Pain in right hip: Secondary | ICD-10-CM

## 2022-02-08 DIAGNOSIS — R2681 Unsteadiness on feet: Secondary | ICD-10-CM

## 2022-02-08 DIAGNOSIS — R262 Difficulty in walking, not elsewhere classified: Secondary | ICD-10-CM

## 2022-02-08 DIAGNOSIS — M6281 Muscle weakness (generalized): Secondary | ICD-10-CM

## 2022-02-08 DIAGNOSIS — M25552 Pain in left hip: Secondary | ICD-10-CM

## 2022-02-08 NOTE — Therapy (Signed)
OUTPATIENT PHYSICAL THERAPY TREATMENT NOTE   Patient Name: Carolyn Esparza MRN: [id number] DOB:March 10, 1966, 56 y.o., female Today's Date: 02/08/2022  PCP: [name], [name], MD REFERRING PROVIDER: Raelene Bott, PA  END OF SESSION:   PT End of Session - 02/08/22 1526     Visit Number 5    Number of Visits 24    Date for PT Re-Evaluation 04/20/22    Authorization Type [employer] reporting period from 01/26/2022    Authorization Time Period VL: max combined 60 PT/OT per year    Authorization - Visit Number 5    Authorization - Number of Visits 60    Progress Note Due on Visit 10    PT Start Time 1518    PT Stop Time 1556    PT Time Calculation (min) 38 min    Activity Tolerance Patient tolerated treatment well    Behavior During Therapy WFL for tasks assessed/performed              Past Medical History:  Diagnosis Date   Anxiety    ASCUS with positive high risk HPV cervical    CAD (coronary artery disease)    Chicken pox    Chronic pain    neck, back, b/l hips    Chronic pain of right hip 10/21/4008   Complication of anesthesia    DDD (degenerative disc disease), lumbar 01/06/2021   Depression    Facet arthritis of lumbar region 01/06/2021   GERD (gastroesophageal reflux disease)    History of Crohn's disease    Hyperlipidemia    Left ovarian cyst    s/p removal of 1 ovary ? which one removed per pt    Libido, decreased    Skin cancer    reports skin cancer removed from skin in 2016 or 2017   Thyroid nodule    repeat US resolved and Ct neck 05/2019 normal thyroid    Past Surgical History:  Procedure Laterality Date   ABDOMINAL HYSTERECTOMY     APPENDECTOMY  2004   BLADDER SURGERY     BREAST BIOPSY     x 2   COLONOSCOPY     COLONOSCOPY WITH PROPOFOL N/A 10/25/2020   Procedure: COLONOSCOPY WITH PROPOFOL;  Surgeon: Mauri Pole, MD;  Location: [facility];  Service: Endoscopy;  Laterality: N/A;   HEMOSTASIS CLIP PLACEMENT  10/25/2020   Procedure:  HEMOSTASIS CLIP PLACEMENT;  Surgeon: Mauri Pole, MD;  Location: [facility];  Service: Endoscopy;;   OOPHORECTOMY Right    Unilateral Right (per MRI 04/17/2018)   POLYPECTOMY  10/25/2020   Procedure: POLYPECTOMY;  Surgeon: Mauri Pole, MD;  Location: [facility];  Service: Endoscopy;;   SUBMUCOSAL LIFTING INJECTION  10/25/2020   Procedure: SUBMUCOSAL LIFTING INJECTION;  Surgeon: Mauri Pole, MD;  Location: [facility];  Service: Endoscopy;;   WISDOM TOOTH EXTRACTION     Patient Active Problem List   Diagnosis Date Noted   Cervicalgia 07/01/2021   Low back pain 07/01/2021   Herpes infection 07/01/2021   Chronic pain of right hip 04/28/2021   DDD (degenerative disc disease), lumbar 01/06/2021   Facet arthritis of lumbar region 01/06/2021   Trochanteric bursitis of both hips 11/30/2020   Special screening for malignant neoplasms, colon    Polyp of cecum    Pain in both feet 04/12/2020   Burning sensation of feet 04/12/2020   Pain of both hip joints 04/12/2020   Onychomycosis 04/12/2020   Fibrocystic breast disease (FCBD) 04/12/2020   Numbness in both  legs 09/09/2019   Spinal stenosis in cervical region 05/09/2019   Facet arthropathy, cervical 05/09/2019   Chronic pain 11/27/2018   Obesity (BMI 30.0-34.9) 11/27/2018   Sprain and strain of hip and thigh 05/10/2018   Abnormal MRI, cervical spine 03/28/2018   Menopause 11/26/2017   Right upper quadrant pain 11/26/2017   Abnormal Pap smear of cervix 07/27/2017   Vasomotor flushing 07/19/2017   Depression 07/18/2017   Nausea 06/25/2017   Chronic pain of both hips 06/25/2017   OSA (obstructive sleep apnea) 04/18/2017   Annual physical exam 04/10/2017   Fatigue 12/16/2016   Chronic back pain 05/30/2016   Abdominal pain 03/12/2016   Anxiety and depression 12/28/2015   History of obstructive sleep apnea 12/15/2015   GERD (gastroesophageal reflux disease) 12/15/2015   Hyperlipidemia 12/15/2015   Pain  medication agreement signed 03/25/2015   CAD in native artery 02/05/2015   Right supraspinatus tenosynovitis 08/17/2014   Crohn's disease involving terminal ileum ([place]) 03/30/2014   FH: colon polyps 03/30/2014   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 female who presents to outpatient physical therapy with a referral for medical diagnosis tear of right gluteus medius tendon, trochanteric bursitis of both hips, enthesopathy of hip region on both sides. This patient's chief complaints consist of right hip pain, stiffness, weakness, abnormal gait, difficulty with balance, and dysfunction s/p OPEN REPAIR R GLUTEUS MEDIUS gluteal repair 12/05/2021, leading to the following functional deficits: difficulty with usual activities and basic mobility including household and community ambulation, transfers, walking her dogs, ADLs, IADLs, going to the beach, stairs, sleeping, carrying, lifting, bending, doing activities that require weight bearing without loss of balance, walking the dogs. 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  Patient denies hx of stroke, seizures, lung problems, diabetes, unexplained  weight loss, unexplained changes in bowel or bladder problems, unexplained stumbling or dropping things, osteoporosis, and spinal surgery.  PRECAUTIONS: Other: can transition to weight bearing as tolerated.  See protocol in chart.  SUBJECTIVE: Patient arrives with RW. She states she is tired today after doing a lot of walking yesterday and spending a lot of time and caring for her niece. She think she may need to dial it back a bit because she is tired and sore. She feels like she tolerated last PT session.   PAIN:  Are you having pain? Yes, 4/10 right lateral hip  OBJECTIVE:   TODAY'S TREATMENT:  Therapeutic exercise: to centralize symptoms and improve ROM, strength, muscular endurance, and activity tolerance required for successful completion of functional activities.  - NuStep level 5 using bilateral upper and lower extremities. Seat/handle setting 6/6. For improved extremity mobility, muscular endurance, and activity tolerance; and to induce the analgesic effect of aerobic exercise, stimulate improved joint nutrition,  and prepare body structures and systems for following interventions. x 6  minutes. Average SPM = 65.   Circuit: - standing left foot taps on 8.5 inch platform with B UE support, 3x10 (uncomfortable R hip). - standing R hip flexion, knee extended, 3x10 - R SLS with B UE support (double finger each hand), 1x10 with 5 second holds.  - standing R hip circles with knee extended, 3x10 each direction CW and CCW. - quadruped alternating LE extension, 2x10 each side.  Rest in child's pose between sets on each side. Added half foam roll over sacrum.  - sit <> stand, 2x10 from 18 inch chair, no UE support.  - standing hip adductor strengthening with R knee on rolling stool, rolling it laterally away from body and then back, BUE support, 3x10.  Pt required multimodal cuing for proper technique and to facilitate improved neuromuscular control, strength, range of motion, and functional  ability resulting in improved performance and form.    PATIENT EDUCATION:  Education details:  Exercise purpose/form. Self management techniques. HEP including handout  Reviewed cancelation/no-show policy with patient and confirmed patient has correct phone number for clinic; patient verbalized understanding (02/02/22). Person educated: Patient Education method: Explanation, demonstration, multimodal cuing.  Education comprehension: verbalized and demonstrated understanding and needs further education     HOME EXERCISE PROGRAM: Access Code: [email or web address] URL: [email or web address] Date: 01/30/2022 Prepared by: Rosita Kea  Exercises - Sit to Stand Without Arm Support  - 1 x daily - 1-3 sets - 10 reps - Seated Isometric Hip Abduction with Belt  - 1-2 x daily - 1 sets - 20 reps - 5 seconds hold - Seated Hip Adduction Isometrics with Ball  - 1-2 x daily - 1 sets - 20 reps - 5 seconds hold - Supine Single Leg Hip Abduction with Resistance at Ankles  - 1-2 x daily - 1-3 sets - 5 reps - Supine Bridge with Resistance Band  - 1-2 x daily - 1 sets - 20 reps - 5 seconds hold     ASSESSMENT:   CLINICAL IMPRESSION: Patient tolerated treatment well and was able to continue with similar exercises to last session with slight modifications to limit load on R glute med tendon and overall effort due to increased fatigue and soreness today. Patient continues to have limitations in activities that load the right hip abductors which negatively affects her functional mobility. Patient would benefit from continued management of limiting condition by skilled physical therapist to address remaining impairments and functional limitations to work towards stated goals and return to PLOF or maximal functional independence.   Patient is a 56 y.o. female referred to outpatient physical therapy with a medical diagnosis of tear of right gluteus medius tendon, trochanteric bursitis of both hips, enthesopathy  of hip region on both sides who presents with signs and symptoms consistent with right hip pain, stiffness, weakness, dysfunction, imbalance, gait disturbance s/p OPEN REPAIR R GLUTEUS MEDIUS gluteal repair 12/05/2021 and . Patient presents with significant pain, ROM, posture, joint stiffness, tissue integrity, motor control, flexibility, gait, balance, muscle performance (strength/power/endurance) and activity tolerance impairments that are limiting ability to complete her usual activities and basic mobility including household and community ambulation, transfers, walking her dogs, ADLs, IADLs, going to the beach, stairs, sleeping, carrying, lifting, bending, doing activities that require weight bearing without loss of balance, walking the dogs without difficulty. Patient will benefit from skilled physical therapy intervention to address current body structure impairments and activity limitations to improve function and work towards  goals set in current POC in order to return to prior level of function or maximal functional improvement.    OBJECTIVE IMPAIRMENTS Abnormal gait, decreased activity tolerance, decreased balance, decreased coordination, decreased endurance, decreased knowledge of use of DME, decreased mobility, difficulty walking, decreased ROM, decreased strength, impaired perceived functional ability, impaired flexibility, improper body mechanics, postural dysfunction, and pain.    ACTIVITY LIMITATIONS carrying, lifting, bending, sitting, standing, squatting, sleeping, stairs, transfers, bed mobility, dressing, hygiene/grooming, locomotion level, and caring for others   PARTICIPATION LIMITATIONS: meal prep, cleaning, laundry, interpersonal relationship, driving, shopping, community activity, and   basic mobility including household and community ambulation, transfers, walking her dogs, ADLs, IADLs, going to the beach, stairs, sleeping, carrying, lifting, bending, doing activities that require  weight bearing without loss of balance, walking the dogs.   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    REHAB POTENTIAL: Good   CLINICAL DECISION MAKING: Stable/uncomplicated   EVALUATION COMPLEXITY: Low     GOALS: Goals reviewed with patient? No   SHORT TERM GOALS: Target date: 02/09/2022   Patient will be independent with initial home exercise program for self-management of symptoms. Baseline: Initial HEP to be provided at visit 2 as appropriate (01/26/22); Goal status: In-progress     LONG TERM GOALS: Target date: 04/20/2022   Patient will be independent with a long-term home exercise program for self-management of symptoms.  Baseline: Initial HEP to be provided at visit 2 as appropriate (01/26/22); Goal status: In-progress   2.  Patient will demonstrate improved FOTO to equal or greater than 54 by visit #16 to demonstrate improvement in overall condition and self-reported functional ability.  Baseline: 30 (01/26/22); Goal status: In-progress   3.  Patient will demonstrate R sided single leg stance of equal or greater than 30 seconds with no trendelenburg compensation to decrease fall risk and improve household and community ambulation.  Baseline: 0 seconds (01/26/22); Goal status: In-progress   4.  Patient will ambulate equal or greater than 1000 feet with no AD during 6 min walk test to improve community ambulation.  Baseline: not tested (01/26/22); Goal status: In-progress   5.  Patient will complete community, work and/or recreational activities  without limitation due to current condition.  Baseline: difficulty with usual activities including basic mobility including household and community ambulation, transfers, walking her dogs, ADLs, IADLs, going to the beach, stairs, sleeping, carrying, lifting, bending, doing activities that require weight bearing without loss of balance, walking the dogs (01/26/22); Goal status: In-progress   6.  Patient will demonstrate B hip abduction MMT equal or greater than 4+/5 with no increase in pain to improve activity tolerance for walking her dog.  Baseline: R = 3/5 with pain, left not tested due to lack of tolerance for laying on R side (01/26/2022);   Goal status: In-progress       PLAN: PT FREQUENCY: 1-2x/week   PT DURATION: 12 weeks   PLANNED INTERVENTIONS: Therapeutic exercises, Therapeutic activity, Neuromuscular re-education, Balance training, Gait training, Patient/Family education, Joint mobilization, Stair training, DME instructions, Aquatic Therapy, Dry Needling, Electrical stimulation, Spinal mobilization, Cryotherapy, Moist heat, Manual therapy, and Re-evaluation   PLAN FOR NEXT  SESSION: update HEP as appropriate, progressive strengthening, ROM, gait training, and balance within protocol guidelines. Work towards ability to tolerate R SLS.    Everlean Alstrom. Graylon Good, PT, DPT 02/08/22, 3:58 PM  [facility] [address] [place], [place] [address] P: [phone] I F: [phone]

## 2022-02-13 ENCOUNTER — Ambulatory Visit: Payer: Managed Care, Other (non HMO) | Admitting: Physical Therapy

## 2022-02-13 ENCOUNTER — Encounter: Payer: Self-pay | Admitting: Physical Therapy

## 2022-02-13 DIAGNOSIS — M25551 Pain in right hip: Secondary | ICD-10-CM | POA: Diagnosis not present

## 2022-02-13 DIAGNOSIS — M25552 Pain in left hip: Secondary | ICD-10-CM

## 2022-02-13 DIAGNOSIS — R262 Difficulty in walking, not elsewhere classified: Secondary | ICD-10-CM

## 2022-02-13 DIAGNOSIS — M6281 Muscle weakness (generalized): Secondary | ICD-10-CM

## 2022-02-13 NOTE — Therapy (Signed)
OUTPATIENT PHYSICAL THERAPY TREATMENT NOTE   Patient Name: Carolyn Esparza MRN: [id number] DOB:30-Mar-1966, 56 y.o., female Today's Date: 02/13/2022  PCP: [name], [name], MD REFERRING PROVIDER: Raelene Bott, PA  END OF SESSION:   PT End of Session - 02/13/22 1038     Visit Number 6    Number of Visits 24    Date for PT Re-Evaluation 04/20/22    Authorization Type [employer] reporting period from 01/26/2022    Authorization Time Period VL: max combined 60 PT/OT per year    Authorization - Visit Number 6    Authorization - Number of Visits 60    Progress Note Due on Visit 10    PT Start Time 1038    PT Stop Time 1116    PT Time Calculation (min) 38 min    Activity Tolerance Patient tolerated treatment well    Behavior During Therapy WFL for tasks assessed/performed               Past Medical History:  Diagnosis Date   Anxiety    ASCUS with positive high risk HPV cervical    CAD (coronary artery disease)    Chicken pox    Chronic pain    neck, back, b/l hips    Chronic pain of right hip 2/59/5638   Complication of anesthesia    DDD (degenerative disc disease), lumbar 01/06/2021   Depression    Facet arthritis of lumbar region 01/06/2021   GERD (gastroesophageal reflux disease)    History of Crohn's disease    Hyperlipidemia    Left ovarian cyst    s/p removal of 1 ovary ? which one removed per pt    Libido, decreased    Skin cancer    reports skin cancer removed from skin in 2016 or 2017   Thyroid nodule    repeat US resolved and Ct neck 05/2019 normal thyroid    Past Surgical History:  Procedure Laterality Date   ABDOMINAL HYSTERECTOMY     APPENDECTOMY  2004   BLADDER SURGERY     BREAST BIOPSY     x 2   COLONOSCOPY     COLONOSCOPY WITH PROPOFOL N/A 10/25/2020   Procedure: COLONOSCOPY WITH PROPOFOL;  Surgeon: Mauri Pole, MD;  Location: [facility];  Service: Endoscopy;  Laterality: N/A;   HEMOSTASIS CLIP PLACEMENT  10/25/2020   Procedure:  HEMOSTASIS CLIP PLACEMENT;  Surgeon: Mauri Pole, MD;  Location: [facility];  Service: Endoscopy;;   OOPHORECTOMY Right    Unilateral Right (per MRI 04/17/2018)   POLYPECTOMY  10/25/2020   Procedure: POLYPECTOMY;  Surgeon: Mauri Pole, MD;  Location: [facility];  Service: Endoscopy;;   SUBMUCOSAL LIFTING INJECTION  10/25/2020   Procedure: SUBMUCOSAL LIFTING INJECTION;  Surgeon: Mauri Pole, MD;  Location: [facility];  Service: Endoscopy;;   WISDOM TOOTH EXTRACTION     Patient Active Problem List   Diagnosis Date Noted   Cervicalgia 07/01/2021   Low back pain 07/01/2021   Herpes infection 07/01/2021   Chronic pain of right hip 04/28/2021   DDD (degenerative disc disease), lumbar 01/06/2021   Facet arthritis of lumbar region 01/06/2021   Trochanteric bursitis of both hips 11/30/2020   Special screening for malignant neoplasms, colon    Polyp of cecum    Pain in both feet 04/12/2020   Burning sensation of feet 04/12/2020   Pain of both hip joints 04/12/2020   Onychomycosis 04/12/2020   Fibrocystic breast disease (FCBD) 04/12/2020   Numbness in  both legs 09/09/2019   Spinal stenosis in cervical region 05/09/2019   Facet arthropathy, cervical 05/09/2019   Chronic pain 11/27/2018   Obesity (BMI 30.0-34.9) 11/27/2018   Sprain and strain of hip and thigh 05/10/2018   Abnormal MRI, cervical spine 03/28/2018   Menopause 11/26/2017   Right upper quadrant pain 11/26/2017   Abnormal Pap smear of cervix 07/27/2017   Vasomotor flushing 07/19/2017   Depression 07/18/2017   Nausea 06/25/2017   Chronic pain of both hips 06/25/2017   OSA (obstructive sleep apnea) 04/18/2017   Annual physical exam 04/10/2017   Fatigue 12/16/2016   Chronic back pain 05/30/2016   Abdominal pain 03/12/2016   Anxiety and depression 12/28/2015   History of obstructive sleep apnea 12/15/2015   GERD (gastroesophageal reflux disease) 12/15/2015   Hyperlipidemia 12/15/2015   Pain  medication agreement signed 03/25/2015   CAD in native artery 02/05/2015   Right supraspinatus tenosynovitis 08/17/2014   Crohn's disease involving terminal ileum ([place]) 03/30/2014   FH: colon polyps 03/30/2014   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 female who presents to outpatient physical therapy with a referral for medical diagnosis tear of right gluteus medius tendon, trochanteric bursitis of both hips, enthesopathy of hip region on both sides. This patient's chief complaints consist of right hip pain, stiffness, weakness, abnormal gait, difficulty with balance, and dysfunction s/p OPEN REPAIR R GLUTEUS MEDIUS gluteal repair 12/05/2021, leading to the following functional deficits: difficulty with usual activities and basic mobility including household and community ambulation, transfers, walking her dogs, ADLs, IADLs, going to the beach, stairs, sleeping, carrying, lifting, bending, doing activities that require weight bearing without loss of balance, walking the dogs. 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  Patient denies hx of stroke, seizures, lung problems, diabetes, unexplained weight loss, unexplained  changes in bowel or bladder problems, unexplained stumbling or dropping things, osteoporosis, and spinal surgery.  PRECAUTIONS: Other: can transition to weight bearing as tolerated.  See protocol in chart.  SUBJECTIVE: Patient arrives with [facility]. States she has been using it when she goes out of the house. She tried walking a short distance with no AD yesterday on her deck and noticed her R hip was more achy this morning. She did not do her HEP on Thursday because she was tired and she hurt. She did do her HEP the last 3 days. The last 5 on the bridges make her feel like she is worn out.   PAIN:  Are you having pain? Yes, 4/10 right lateral hip  OBJECTIVE:   TODAY'S TREATMENT:  Therapeutic exercise: to centralize symptoms and improve ROM, strength, muscular endurance, and activity tolerance required for successful completion of functional activities.  - NuStep level 6 using bilateral upper and lower extremities. Seat/handle setting 6/6.  For improved extremity mobility, muscular endurance, and activity tolerance; and to induce the analgesic effect of aerobic exercise, stimulate improved joint nutrition, and prepare body structures and systems for following interventions. x 6  minutes. Average SPM = 65.  - sidelying R hip abduction ~ 2 inches from being supported on bolster with knee extended. 1x10 (difficult, pt request to do only 1 set).  - Quadruped bird dog (alternating shoulder flexion/contralateral hip extension with core muscles braced), 1x10, 1x4 each side. Rest in child's pose. Discontinued due to cramping at R glute med.  - prone alternating hip extension with abdominal brace, knees extended, 2x10.  - standing isometric R hip abduction against wall pushing knee on small pink theraball, 1x12 with 5 second holds. - kateral step back and forth over 6 inch hurdle, 3x5 each direction with B UE support. Limited by fatigue/cramping in R hip.   Pt required multimodal cuing for proper technique and  to facilitate improved neuromuscular control, strength, range of motion, and functional ability resulting in improved performance and form.    PATIENT EDUCATION:  Education details:  Exercise purpose/form. Self management techniques. HEP including handout  Reviewed cancelation/no-show policy with patient and confirmed patient has correct phone number for clinic; patient verbalized understanding (02/02/22). Person educated: Patient Education method: Explanation, demonstration, multimodal cuing.  Education comprehension: verbalized and demonstrated understanding and needs further education     HOME EXERCISE PROGRAM: Access Code: [email or web address] URL: [email or web address] Date: 01/30/2022 Prepared by: Rosita Kea  Exercises - Sit to Stand Without Arm Support  - 1 x daily - 1-3 sets - 10 reps - Seated Isometric Hip Abduction with Belt  - 1-2 x daily - 1 sets - 20 reps - 5 seconds hold - Seated Hip Adduction Isometrics with Ball  - 1-2 x daily - 1 sets - 20 reps - 5 seconds hold - Supine Single Leg Hip Abduction with Resistance at Ankles  - 1-2 x daily - 1-3 sets - 5 reps - Supine Bridge with Resistance Band  - 1-2 x daily - 1 sets - 20 reps - 5 seconds hold   ASSESSMENT:   CLINICAL IMPRESSION: Patient struggled today with exercises that isolate glute med with patient being limited by cramping and pain at R lateral hip.  Continued with glute med strengthening as tolerated. Patient would benefit from continued management of limiting condition by skilled physical therapist to address remaining impairments and functional limitations to work towards stated goals and return to PLOF or maximal functional independence.   Patient is a 56 y.o. female referred to outpatient physical therapy with a medical diagnosis of tear of right gluteus medius tendon, trochanteric bursitis of both hips, enthesopathy of hip region on both sides who presents with signs and symptoms consistent with right hip  pain, stiffness, weakness, dysfunction, imbalance, gait disturbance s/p OPEN REPAIR R GLUTEUS MEDIUS gluteal repair 12/05/2021 and . Patient presents with significant pain, ROM, posture, joint stiffness, tissue integrity, motor control, flexibility, gait, balance, muscle performance (strength/power/endurance) and activity tolerance impairments that are limiting ability to complete her usual activities and basic mobility including household and community ambulation, transfers, walking her dogs, ADLs, IADLs, going to the beach, stairs, sleeping, carrying, lifting, bending, doing activities that require weight bearing without loss of balance, walking the dogs without difficulty. Patient will benefit from skilled physical therapy intervention to address current body structure impairments and activity limitations to improve function and work towards goals set in current POC in order to return to prior level of function  or maximal functional improvement.    OBJECTIVE IMPAIRMENTS Abnormal gait, decreased activity tolerance, decreased balance, decreased coordination, decreased endurance, decreased knowledge of use of DME, decreased mobility, difficulty walking, decreased ROM, decreased strength, impaired perceived functional ability, impaired flexibility, improper body mechanics, postural dysfunction, and pain.    ACTIVITY LIMITATIONS carrying, lifting, bending, sitting, standing, squatting, sleeping, stairs, transfers, bed mobility, dressing, hygiene/grooming, locomotion level, and caring for others   PARTICIPATION LIMITATIONS: meal prep, cleaning, laundry, interpersonal relationship, driving, shopping, community activity, and   basic mobility including household and community ambulation, transfers, walking her dogs, ADLs, IADLs, going to the beach, stairs, sleeping, carrying, lifting, bending, doing activities that require weight bearing without loss of balance, walking the dogs.   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    REHAB POTENTIAL: Good   CLINICAL DECISION MAKING: Stable/uncomplicated   EVALUATION COMPLEXITY: Low     GOALS: Goals reviewed with patient? No   SHORT TERM GOALS: Target date: 02/09/2022   Patient will be independent with initial home exercise program for self-management of symptoms. Baseline: Initial HEP to be provided at visit 2 as appropriate (01/26/22); Goal status: In-progress     LONG TERM GOALS: Target date: 04/20/2022   Patient will be independent with a long-term home exercise program for self-management of symptoms.  Baseline: Initial HEP to be provided at visit 2 as appropriate (01/26/22); Goal status: In-progress   2.  Patient will demonstrate improved FOTO to equal or greater than 54 by visit #16 to demonstrate improvement in overall condition and self-reported functional ability.  Baseline: 30 (01/26/22); Goal status: In-progress   3.  Patient will demonstrate R sided single leg stance of equal or greater than 30 seconds with no trendelenburg compensation to decrease fall risk and improve household and community ambulation.  Baseline: 0 seconds (01/26/22); Goal status: In-progress   4.  Patient will ambulate equal or greater than 1000 feet with no AD during 6 min walk test to improve community ambulation.  Baseline: not tested (01/26/22); Goal status: In-progress   5.  Patient will complete community, work and/or recreational activities without limitation due to current condition.  Baseline: difficulty with usual activities  including basic mobility including household and community ambulation, transfers, walking her dogs, ADLs, IADLs, going to the beach, stairs, sleeping, carrying, lifting, bending, doing activities that require weight bearing without loss of balance, walking the dogs (01/26/22); Goal status: In-progress   6.  Patient will demonstrate B hip abduction MMT equal or greater than 4+/5 with no increase in pain to improve activity tolerance for walking her dog.  Baseline: R = 3/5 with pain, left not tested due to lack of tolerance for laying on R side (01/26/2022);   Goal status: In-progress       PLAN: PT FREQUENCY: 1-2x/week   PT DURATION: 12 weeks   PLANNED INTERVENTIONS: Therapeutic exercises, Therapeutic activity, Neuromuscular re-education, Balance training, Gait training, Patient/Family education, Joint mobilization, Stair training, DME instructions, Aquatic Therapy, Dry Needling, Electrical stimulation, Spinal mobilization, Cryotherapy, Moist heat, Manual therapy, and Re-evaluation   PLAN FOR NEXT SESSION: update HEP as appropriate, progressive strengthening, ROM, gait training, and balance within protocol  guidelines. Work towards ability to tolerate R SLS.    Everlean Alstrom. Graylon Good, PT, DPT 02/13/22, 11:18 AM  [facility] [address] [place], [place] [address] P: [phone] I F: [phone]

## 2022-02-16 ENCOUNTER — Encounter: Payer: Self-pay | Admitting: Physical Therapy

## 2022-02-16 ENCOUNTER — Ambulatory Visit: Payer: Managed Care, Other (non HMO) | Admitting: Physical Therapy

## 2022-02-16 DIAGNOSIS — M25551 Pain in right hip: Secondary | ICD-10-CM | POA: Diagnosis not present

## 2022-02-16 DIAGNOSIS — R2681 Unsteadiness on feet: Secondary | ICD-10-CM

## 2022-02-16 DIAGNOSIS — M25552 Pain in left hip: Secondary | ICD-10-CM

## 2022-02-16 DIAGNOSIS — M6281 Muscle weakness (generalized): Secondary | ICD-10-CM

## 2022-02-16 DIAGNOSIS — R262 Difficulty in walking, not elsewhere classified: Secondary | ICD-10-CM

## 2022-02-16 NOTE — Therapy (Signed)
OUTPATIENT PHYSICAL THERAPY TREATMENT NOTE   Patient Name: Taleeyah Bora MRN: [id number] DOB:1965/09/01, 56 y.o., female Today's Date: 02/16/2022  PCP: [name], [name], MD REFERRING PROVIDER: Raelene Bott, PA  END OF SESSION:   PT End of Session - 02/16/22 1531     Visit Number 7    Number of Visits 24    Date for PT Re-Evaluation 04/20/22    Authorization Type [employer] reporting period from 01/26/2022    Authorization Time Period VL: max combined 60 PT/OT per year    Authorization - Visit Number 7    Authorization - Number of Visits 60    Progress Note Due on Visit 10    PT Start Time 1525    PT Stop Time 1603    PT Time Calculation (min) 38 min    Activity Tolerance Patient tolerated treatment well    Behavior During Therapy WFL for tasks assessed/performed                Past Medical History:  Diagnosis Date   Anxiety    ASCUS with positive high risk HPV cervical    CAD (coronary artery disease)    Chicken pox    Chronic pain    neck, back, b/l hips    Chronic pain of right hip 8/75/6433   Complication of anesthesia    DDD (degenerative disc disease), lumbar 01/06/2021   Depression    Facet arthritis of lumbar region 01/06/2021   GERD (gastroesophageal reflux disease)    History of Crohn's disease    Hyperlipidemia    Left ovarian cyst    s/p removal of 1 ovary ? which one removed per pt    Libido, decreased    Skin cancer    reports skin cancer removed from skin in 2016 or 2017   Thyroid nodule    repeat US resolved and Ct neck 05/2019 normal thyroid    Past Surgical History:  Procedure Laterality Date   ABDOMINAL HYSTERECTOMY     APPENDECTOMY  2004   BLADDER SURGERY     BREAST BIOPSY     x 2   COLONOSCOPY     COLONOSCOPY WITH PROPOFOL N/A 10/25/2020   Procedure: COLONOSCOPY WITH PROPOFOL;  Surgeon: Mauri Pole, MD;  Location: [facility];  Service: Endoscopy;  Laterality: N/A;   HEMOSTASIS CLIP PLACEMENT  10/25/2020   Procedure:  HEMOSTASIS CLIP PLACEMENT;  Surgeon: Mauri Pole, MD;  Location: [facility];  Service: Endoscopy;;   OOPHORECTOMY Right    Unilateral Right (per MRI 04/17/2018)   POLYPECTOMY  10/25/2020   Procedure: POLYPECTOMY;  Surgeon: Mauri Pole, MD;  Location: [facility];  Service: Endoscopy;;   SUBMUCOSAL LIFTING INJECTION  10/25/2020   Procedure: SUBMUCOSAL LIFTING INJECTION;  Surgeon: Mauri Pole, MD;  Location: [facility];  Service: Endoscopy;;   WISDOM TOOTH EXTRACTION     Patient Active Problem List   Diagnosis Date Noted   Cervicalgia 07/01/2021   Low back pain 07/01/2021   Herpes infection 07/01/2021   Chronic pain of right hip 04/28/2021   DDD (degenerative disc disease), lumbar 01/06/2021   Facet arthritis of lumbar region 01/06/2021   Trochanteric bursitis of both hips 11/30/2020   Special screening for malignant neoplasms, colon    Polyp of cecum    Pain in both feet 04/12/2020   Burning sensation of feet 04/12/2020   Pain of both hip joints 04/12/2020   Onychomycosis 04/12/2020   Fibrocystic breast disease (FCBD) 04/12/2020   Numbness  in both legs 09/09/2019   Spinal stenosis in cervical region 05/09/2019   Facet arthropathy, cervical 05/09/2019   Chronic pain 11/27/2018   Obesity (BMI 30.0-34.9) 11/27/2018   Sprain and strain of hip and thigh 05/10/2018   Abnormal MRI, cervical spine 03/28/2018   Menopause 11/26/2017   Right upper quadrant pain 11/26/2017   Abnormal Pap smear of cervix 07/27/2017   Vasomotor flushing 07/19/2017   Depression 07/18/2017   Nausea 06/25/2017   Chronic pain of both hips 06/25/2017   OSA (obstructive sleep apnea) 04/18/2017   Annual physical exam 04/10/2017   Fatigue 12/16/2016   Chronic back pain 05/30/2016   Abdominal pain 03/12/2016   Anxiety and depression 12/28/2015   History of obstructive sleep apnea 12/15/2015   GERD (gastroesophageal reflux disease) 12/15/2015   Hyperlipidemia 12/15/2015   Pain  medication agreement signed 03/25/2015   CAD in native artery 02/05/2015   Right supraspinatus tenosynovitis 08/17/2014   Crohn's disease involving terminal ileum ([place]) 03/30/2014   FH: colon polyps 03/30/2014   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 female who presents to outpatient physical therapy with a referral for medical diagnosis tear of right gluteus medius tendon, trochanteric bursitis of both hips, enthesopathy of hip region on both sides. This patient's chief complaints consist of right hip pain, stiffness, weakness, abnormal gait, difficulty with balance, and dysfunction s/p OPEN REPAIR R GLUTEUS MEDIUS gluteal repair 12/05/2021, leading to the following functional deficits: difficulty with usual activities and basic mobility including household and community ambulation, transfers, walking her dogs, ADLs, IADLs, going to the beach, stairs, sleeping, carrying, lifting, bending, doing activities that require weight bearing without loss of balance, walking the dogs. 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  Patient denies hx of stroke, seizures, lung problems, diabetes, unexplained  weight loss, unexplained changes in bowel or bladder problems, unexplained stumbling or dropping things, osteoporosis, and spinal surgery.  PRECAUTIONS: Other: can transition to weight bearing as tolerated.  See protocol in chart.  SUBJECTIVE: Patient arrives with [facility]. She states she has been very busy since last PT session and thinks she has been doing a little too much walking with her cane out of the house. She has ~ 3K steps on her watch currently. She rates her pain 4/10 at her right lateral hip and state she is tired. She has not done her HEP due to being so busy. She felt okay after last PT session. States pain in her back and hip is making her toss and turn a lot. She is preparing for a trip to [place].   PAIN:  Are you having pain? Yes, 4/10 right lateral hip  OBJECTIVE:   TODAY'S TREATMENT:  Therapeutic exercise: to centralize symptoms and improve ROM, strength,  muscular endurance, and activity tolerance required for successful completion of functional activities.  - NuStep level 5 using bilateral upper and lower extremities. Seat/handle setting 6/8. For improved extremity mobility, muscular endurance, and activity tolerance; and to induce the analgesic effect of aerobic exercise, stimulate improved joint nutrition, and prepare body structures and systems for following interventions. x 6:40  minutes. Average SPM = 66.   Circuit: - standing R hip abduction with 2#AW, 3x10 - standing R hip flexion with knee extended, 2#AW, 3x10 - standing hip extension with knee extended,, bent forwards to decrease lumbar extension,  2#AW, 3x10  - standing B hip ER/IR while standing on rotation discs, 1x10 each way.  - standing B hip ER while standing on rotation discs, YTB around thighs, 2x10 (minimal resistance felt).  - standing B hip ER while standing on rotation discx, YTB around toes, 3x10.  - Quadruped bird dog (alternating shoulder flexion/contralateral hip extension with core muscles  braced), 2x10 each side. Rest in child's pose.  - Education on HEP including handout   Pt required multimodal cuing for proper technique and to facilitate improved neuromuscular control, strength, range of motion, and functional ability resulting in improved performance and form.    PATIENT EDUCATION:  Education details:  Exercise purpose/form. Self management techniques. HEP including handout  Reviewed cancelation/no-show policy with patient and confirmed patient has correct phone number for clinic; patient verbalized understanding (02/02/22). Person educated: Patient Education method: Explanation, demonstration, multimodal cuing.  Education comprehension: verbalized and demonstrated understanding and needs further education     HOME EXERCISE PROGRAM: Access Code: [email or web address] URL: [email or web address] Date: 02/16/2022 Prepared by: Rosita Kea  Exercises - Sit to Stand Without Arm Support  - 1 x daily - 2 sets - 10 reps - Supine Bridge with Resistance Band  - 1-2 x daily - 1 sets - 20 reps - 5 seconds hold - Standing Hip Abduction with Counter Support  - 1 x daily - 3 sets - 10 reps - 1-4 seconds hold - Standing Hip Flexion AROM  - 1 x daily - 3 sets - 10 reps - 1-4 seconds hold - Prone Hip Extension on Table  - 1 x daily - 3 sets - 10 reps - 1-4 seconds hold   ASSESSMENT:   CLINICAL IMPRESSION: Patient tolerated treatment well overall and had better able to tolerate more gentle exercises. Updated HEP accordingly. Patient would benefit from continued management of limiting condition by skilled physical therapist to address remaining impairments and functional limitations to work towards stated goals and return to PLOF or maximal functional independence.   Patient is a 56 y.o. female referred to outpatient physical therapy with a medical diagnosis of tear of right gluteus medius tendon, trochanteric bursitis of both hips, enthesopathy of hip region on both sides who  presents with signs and symptoms consistent with right hip pain, stiffness, weakness, dysfunction, imbalance, gait disturbance s/p OPEN REPAIR R GLUTEUS MEDIUS gluteal repair 12/05/2021 and . Patient presents with significant pain, ROM, posture, joint stiffness, tissue integrity, motor control, flexibility, gait, balance, muscle performance (strength/power/endurance) and activity tolerance impairments that are limiting ability to complete her usual activities and basic mobility including household and community ambulation, transfers, walking her dogs, ADLs, IADLs, going to the beach, stairs, sleeping, carrying, lifting, bending, doing activities that require weight bearing without loss of balance, walking the dogs without difficulty. Patient will benefit from skilled physical therapy intervention to address current body structure impairments and activity limitations to improve function and work towards goals  set in current POC in order to return to prior level of function or maximal functional improvement.    OBJECTIVE IMPAIRMENTS Abnormal gait, decreased activity tolerance, decreased balance, decreased coordination, decreased endurance, decreased knowledge of use of DME, decreased mobility, difficulty walking, decreased ROM, decreased strength, impaired perceived functional ability, impaired flexibility, improper body mechanics, postural dysfunction, and pain.    ACTIVITY LIMITATIONS carrying, lifting, bending, sitting, standing, squatting, sleeping, stairs, transfers, bed mobility, dressing, hygiene/grooming, locomotion level, and caring for others   PARTICIPATION LIMITATIONS: meal prep, cleaning, laundry, interpersonal relationship, driving, shopping, community activity, and   basic mobility including household and community ambulation, transfers, walking her dogs, ADLs, IADLs, going to the beach, stairs, sleeping, carrying, lifting, bending, doing activities that require weight bearing without loss of  balance, walking the dogs.   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    REHAB POTENTIAL: Good   CLINICAL DECISION MAKING: Stable/uncomplicated   EVALUATION COMPLEXITY: Low     GOALS: Goals reviewed with patient? No   SHORT TERM GOALS: Target date: 02/09/2022   Patient will be independent with initial home exercise program for self-management of symptoms. Baseline: Initial HEP to be provided at visit 2 as appropriate (01/26/22); Goal status: In-progress     LONG TERM GOALS: Target date: 04/20/2022   Patient will be independent with a long-term home exercise program for self-management of symptoms.  Baseline: Initial HEP to be provided at visit 2 as appropriate (01/26/22); Goal status: In-progress   2.  Patient will demonstrate improved FOTO to equal or greater than 54 by visit #16 to demonstrate improvement in overall condition and self-reported functional ability.  Baseline: 30 (01/26/22); Goal status: In-progress   3.  Patient will demonstrate R sided single leg stance of equal or greater than 30 seconds with no trendelenburg compensation to decrease fall risk and improve household and community ambulation.  Baseline: 0 seconds (01/26/22); Goal status: In-progress   4.  Patient will ambulate equal or greater than 1000 feet with no AD during 6 min walk test to improve community ambulation.  Baseline: not tested (01/26/22); Goal status: In-progress   5.  Patient will complete community, work and/or recreational activities without limitation due to current  condition.  Baseline: difficulty with usual activities including basic mobility including household and community ambulation, transfers, walking her dogs, ADLs, IADLs, going to the beach, stairs, sleeping, carrying, lifting, bending, doing activities that require weight bearing without loss of balance, walking the dogs (01/26/22); Goal status: In-progress   6.  Patient will demonstrate B hip abduction MMT equal or greater than 4+/5 with no increase in pain to improve activity tolerance for walking her dog.  Baseline: R = 3/5 with pain, left not tested due to lack of tolerance for laying on R side (01/26/2022);   Goal status: In-progress       PLAN: PT FREQUENCY: 1-2x/week   PT DURATION: 12 weeks   PLANNED INTERVENTIONS: Therapeutic exercises, Therapeutic activity, Neuromuscular re-education, Balance training, Gait training, Patient/Family education, Joint mobilization, Stair training, DME instructions, Aquatic Therapy, Dry Needling, Electrical stimulation, Spinal mobilization, Cryotherapy, Moist heat, Manual therapy, and Re-evaluation   PLAN FOR NEXT SESSION:  update HEP as appropriate, progressive strengthening, ROM, gait training, and balance within protocol guidelines. Work towards ability to tolerate R SLS.    Everlean Alstrom. Graylon Good, PT, DPT 02/16/22, 4:05 PM  [facility] [address] [place], [place] [address] P: [phone] I F: [phone]

## 2022-02-20 ENCOUNTER — Encounter: Payer: Managed Care, Other (non HMO) | Admitting: Physical Therapy

## 2022-02-22 ENCOUNTER — Encounter: Payer: Managed Care, Other (non HMO) | Admitting: Physical Therapy

## 2022-02-27 ENCOUNTER — Encounter: Payer: Self-pay | Admitting: Physical Therapy

## 2022-02-27 ENCOUNTER — Ambulatory Visit: Payer: Managed Care, Other (non HMO)

## 2022-02-27 DIAGNOSIS — M25551 Pain in right hip: Secondary | ICD-10-CM | POA: Diagnosis not present

## 2022-02-27 DIAGNOSIS — M6281 Muscle weakness (generalized): Secondary | ICD-10-CM

## 2022-02-27 DIAGNOSIS — R2681 Unsteadiness on feet: Secondary | ICD-10-CM

## 2022-02-27 DIAGNOSIS — R262 Difficulty in walking, not elsewhere classified: Secondary | ICD-10-CM

## 2022-02-27 DIAGNOSIS — M25552 Pain in left hip: Secondary | ICD-10-CM

## 2022-02-27 NOTE — Therapy (Signed)
OUTPATIENT PHYSICAL THERAPY TREATMENT NOTE   Patient Name: Carolyn Esparza MRN: [id number] DOB:08-26-1965, 56 y.o., female Today's Date: 02/27/2022  PCP: [name], [name], MD REFERRING PROVIDER: Raelene Bott, PA  END OF SESSION:   PT End of Session - 02/27/22 1342     Visit Number 8    Number of Visits 24    Date for PT Re-Evaluation 04/20/22    Authorization Type [employer] reporting period from 01/26/2022    Authorization Time Period VL: max combined 60 PT/OT per year    Authorization - Visit Number 7    Authorization - Number of Visits 60    Progress Note Due on Visit 10    PT Start Time 4098    PT Stop Time 1429    PT Time Calculation (min) 44 min    Activity Tolerance Patient tolerated treatment well    Behavior During Therapy WFL for tasks assessed/performed                Past Medical History:  Diagnosis Date   Anxiety    ASCUS with positive high risk HPV cervical    CAD (coronary artery disease)    Chicken pox    Chronic pain    neck, back, b/l hips    Chronic pain of right hip 08/18/1476   Complication of anesthesia    DDD (degenerative disc disease), lumbar 01/06/2021   Depression    Facet arthritis of lumbar region 01/06/2021   GERD (gastroesophageal reflux disease)    History of Crohn's disease    Hyperlipidemia    Left ovarian cyst    s/p removal of 1 ovary ? which one removed per pt    Libido, decreased    Skin cancer    reports skin cancer removed from skin in 2016 or 2017   Thyroid nodule    repeat US resolved and Ct neck 05/2019 normal thyroid    Past Surgical History:  Procedure Laterality Date   ABDOMINAL HYSTERECTOMY     APPENDECTOMY  2004   BLADDER SURGERY     BREAST BIOPSY     x 2   COLONOSCOPY     COLONOSCOPY WITH PROPOFOL N/A 10/25/2020   Procedure: COLONOSCOPY WITH PROPOFOL;  Surgeon: Mauri Pole, MD;  Location: [facility];  Service: Endoscopy;  Laterality: N/A;   HEMOSTASIS CLIP PLACEMENT  10/25/2020   Procedure:  HEMOSTASIS CLIP PLACEMENT;  Surgeon: Mauri Pole, MD;  Location: [facility];  Service: Endoscopy;;   OOPHORECTOMY Right    Unilateral Right (per MRI 04/17/2018)   POLYPECTOMY  10/25/2020   Procedure: POLYPECTOMY;  Surgeon: Mauri Pole, MD;  Location: [facility];  Service: Endoscopy;;   SUBMUCOSAL LIFTING INJECTION  10/25/2020   Procedure: SUBMUCOSAL LIFTING INJECTION;  Surgeon: Mauri Pole, MD;  Location: [facility];  Service: Endoscopy;;   WISDOM TOOTH EXTRACTION     Patient Active Problem List   Diagnosis Date Noted   Cervicalgia 07/01/2021   Low back pain 07/01/2021   Herpes infection 07/01/2021   Chronic pain of right hip 04/28/2021   DDD (degenerative disc disease), lumbar 01/06/2021   Facet arthritis of lumbar region 01/06/2021   Trochanteric bursitis of both hips 11/30/2020   Special screening for malignant neoplasms, colon    Polyp of cecum    Pain in both feet 04/12/2020   Burning sensation of feet 04/12/2020   Pain of both hip joints 04/12/2020   Onychomycosis 04/12/2020   Fibrocystic breast disease (FCBD) 04/12/2020   Numbness  in both legs 09/09/2019   Spinal stenosis in cervical region 05/09/2019   Facet arthropathy, cervical 05/09/2019   Chronic pain 11/27/2018   Obesity (BMI 30.0-34.9) 11/27/2018   Sprain and strain of hip and thigh 05/10/2018   Abnormal MRI, cervical spine 03/28/2018   Menopause 11/26/2017   Right upper quadrant pain 11/26/2017   Abnormal Pap smear of cervix 07/27/2017   Vasomotor flushing 07/19/2017   Depression 07/18/2017   Nausea 06/25/2017   Chronic pain of both hips 06/25/2017   OSA (obstructive sleep apnea) 04/18/2017   Annual physical exam 04/10/2017   Fatigue 12/16/2016   Chronic back pain 05/30/2016   Abdominal pain 03/12/2016   Anxiety and depression 12/28/2015   History of obstructive sleep apnea 12/15/2015   GERD (gastroesophageal reflux disease) 12/15/2015   Hyperlipidemia 12/15/2015   Pain  medication agreement signed 03/25/2015   CAD in native artery 02/05/2015   Right supraspinatus tenosynovitis 08/17/2014   Crohn's disease involving terminal ileum ([place]) 03/30/2014   FH: colon polyps 03/30/2014   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 female who presents to outpatient physical therapy with a referral for medical diagnosis tear of right gluteus medius tendon, trochanteric bursitis of both hips, enthesopathy of hip region on both sides. This patient's chief complaints consist of right hip pain, stiffness, weakness, abnormal gait, difficulty with balance, and dysfunction s/p OPEN REPAIR R GLUTEUS MEDIUS gluteal repair 12/05/2021, leading to the following functional deficits: difficulty with usual activities and basic mobility including household and community ambulation, transfers, walking her dogs, ADLs, IADLs, going to the beach, stairs, sleeping, carrying, lifting, bending, doing activities that require weight bearing without loss of balance, walking the dogs. 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  Patient denies hx of stroke, seizures, lung problems, diabetes, unexplained  weight loss, unexplained changes in bowel or bladder problems, unexplained stumbling or dropping things, osteoporosis, and spinal surgery.  PRECAUTIONS: Other: can transition to weight bearing as tolerated.  See protocol in chart.  SUBJECTIVE: Pt returning after vacation. Has some increased pain today due to walking and helping husband unpack. Has been somewhat compliant with HEP performing intermittently while on vacation. Been utilizing SPC for community, RW in household due to increased pain.  PAIN:  Are you having pain? Yes, 6/10 right lateral hip  OBJECTIVE:   TODAY'S TREATMENT:  02/27/22 There.ex:  Nu-step L2 to L4 for 5 minutes using UE's/LE's for LE warm up.    Circuit: - standing B hip abduction with 2#AW, 3x10 on RLE, 3x5 on LLE - standing B hip flexion with knee extended, 2#AW, 3x10, 3x5 on LLE  - standing B hip extension with knee extended, bent forwards to decrease lumbar extension,  2#AW, 3x10   - standing B hip  ER to IR on rotation discs. X20/direction - standing B hip ER while standing on rotation discx, YTB around toes, 3x20 - SLS on RLE with contralateral UE support, 1x10, 3-5 sec bouts.    -quadruped bird dog (alternating shoulder flexion/contralateral hip extension with core muscles braced), 2x10 each side. Rest in child's pose between sets. Min VC's for form/technique with neutral hip positioning.   - Bridge with isometric hip ER with RTB: 3x10 - RLE hip hike with LLE on airex pad to limit R glut med activation to pt comfort. 2x5.  - Alternating lateral 6" step ups with BUE support on treadmill bar for support: 2x10/LE.   Pt required multimodal cuing for proper technique and to facilitate improved neuromuscular control, strength, range of motion, and functional ability resulting in improved performance and form.    PATIENT EDUCATION:  Education details:  Exercise purpose/form. Self management techniques. HEP including handout  Reviewed cancelation/no-show  policy with patient and confirmed patient has correct phone number for clinic; patient verbalized understanding (02/02/22). Person educated: Patient Education method: Explanation, demonstration, multimodal cuing.  Education comprehension: verbalized and demonstrated understanding and needs further education     HOME EXERCISE PROGRAM: Access Code: [email or web address] URL: [email or web address] Date: 02/16/2022 Prepared by: Rosita Kea  Exercises - Sit to Stand Without Arm Support  - 1 x daily - 2 sets - 10 reps - Supine Bridge with Resistance Band  - 1-2 x daily - 1 sets - 20 reps - 5 seconds hold - Standing Hip Abduction with Counter Support  - 1 x daily - 3 sets - 10 reps - 1-4 seconds hold - Standing Hip Flexion AROM  - 1 x daily - 3 sets - 10 reps - 1-4 seconds hold - Prone Hip Extension on Table  - 1 x daily - 3 sets - 10 reps - 1-4 seconds hold   ASSESSMENT:   CLINICAL IMPRESSION: Continuing PT POC with progressive loading to hip musculature. Pt improving in tolerance for RLE SLS up to 5 sec increments but relies on single UE support. Minor increases in pain throughout session primarily with standing on RLE. Will continue PT POC to progress gait, balance, and hip strengthening as tolerated to return to PLOF.    From eval: Patient is a 56 y.o. female referred to outpatient physical therapy with a medical diagnosis of tear of right gluteus medius tendon, trochanteric bursitis of both hips, enthesopathy of hip region on both sides who presents with signs and symptoms consistent with right hip pain, stiffness, weakness, dysfunction, imbalance, gait disturbance s/p OPEN REPAIR R GLUTEUS MEDIUS gluteal repair 12/05/2021 and . Patient presents with significant pain, ROM, posture, joint stiffness, tissue integrity, motor control, flexibility, gait, balance, muscle performance (strength/power/endurance) and activity tolerance impairments that are limiting ability to complete her usual  activities and basic mobility including household and community ambulation, transfers, walking her dogs, ADLs, IADLs, going to the beach, stairs, sleeping, carrying, lifting, bending, doing activities that require weight bearing without loss of balance, walking the dogs without difficulty. Patient will benefit from skilled physical therapy intervention to address current body structure impairments and activity limitations to improve function and work towards goals set in current POC in order to return to prior level of function or maximal functional improvement.    OBJECTIVE IMPAIRMENTS Abnormal gait, decreased activity tolerance, decreased balance, decreased coordination, decreased endurance, decreased knowledge of use of DME, decreased mobility, difficulty walking, decreased ROM, decreased strength, impaired perceived functional ability, impaired flexibility, improper body mechanics, postural dysfunction,  and pain.    ACTIVITY LIMITATIONS carrying, lifting, bending, sitting, standing, squatting, sleeping, stairs, transfers, bed mobility, dressing, hygiene/grooming, locomotion level, and caring for others   PARTICIPATION LIMITATIONS: meal prep, cleaning, laundry, interpersonal relationship, driving, shopping, community activity, and   basic mobility including household and community ambulation, transfers, walking her dogs, ADLs, IADLs, going to the beach, stairs, sleeping, carrying, lifting, bending, doing activities that require weight bearing without loss of balance, walking the dogs.   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    REHAB POTENTIAL: Good   CLINICAL DECISION MAKING: Stable/uncomplicated   EVALUATION COMPLEXITY: Low     GOALS: Goals reviewed with patient? No   SHORT TERM GOALS: Target date: 02/09/2022   Patient will be independent with initial home exercise program for self-management of symptoms. Baseline: Initial HEP to be provided at visit 2 as appropriate (01/26/22); Goal status: In-progress     LONG TERM GOALS: Target date: 04/20/2022   Patient will be independent with a long-term home exercise program for self-management of symptoms.  Baseline: Initial HEP to be provided at visit 2 as appropriate (01/26/22); Goal status: In-progress   2.  Patient will demonstrate improved FOTO to equal or greater than 54 by visit #16 to demonstrate improvement in overall condition and self-reported functional ability.  Baseline: 30 (01/26/22); Goal status: In-progress   3.  Patient will demonstrate R sided single leg stance of equal or greater than 30 seconds with no trendelenburg compensation to decrease fall risk and improve household and community ambulation.  Baseline: 0 seconds (01/26/22); Goal status: In-progress   4.  Patient will ambulate equal or greater than 1000 feet with no AD during 6 min walk test to improve community ambulation.  Baseline: not tested (01/26/22); Goal status: In-progress   5.  Patient will complete community, work and/or recreational activities without limitation due to current condition.  Baseline: difficulty with usual activities including basic mobility including household and community ambulation, transfers, walking her dogs, ADLs, IADLs, going to the beach, stairs, sleeping, carrying, lifting, bending, doing activities that require weight bearing without loss of balance, walking the dogs (01/26/22); Goal status: In-progress   6.  Patient will demonstrate B hip abduction MMT equal or greater than 4+/5 with no  increase in pain to improve activity tolerance for walking her dog.  Baseline: R = 3/5 with pain, left not tested due to lack of tolerance for laying on R side (01/26/2022);   Goal status: In-progress       PLAN: PT FREQUENCY: 1-2x/week   PT DURATION: 12 weeks   PLANNED INTERVENTIONS: Therapeutic exercises, Therapeutic activity, Neuromuscular re-education, Balance training, Gait training, Patient/Family education, Joint mobilization, Stair training, DME instructions, Aquatic Therapy, Dry Needling, Electrical stimulation, Spinal mobilization, Cryotherapy, Moist heat, Manual therapy, and Re-evaluation   PLAN FOR NEXT SESSION: update HEP as appropriate, progressive strengthening, ROM, gait training, and balance within protocol guidelines. Work towards ability to tolerate R SLS.    Salem Caster. Fairly IV, PT, DPT Physical Therapist- [facility]  02/27/22, 2:34 PM  [facility] [address] [place], [place] [address]  P: [phone] I F: [phone]

## 2022-03-02 ENCOUNTER — Ambulatory Visit: Payer: Managed Care, Other (non HMO) | Attending: Orthopedic Surgery | Admitting: Physical Therapy

## 2022-03-02 ENCOUNTER — Encounter: Payer: Self-pay | Admitting: Physical Therapy

## 2022-03-02 DIAGNOSIS — R262 Difficulty in walking, not elsewhere classified: Secondary | ICD-10-CM | POA: Diagnosis present

## 2022-03-02 DIAGNOSIS — R2681 Unsteadiness on feet: Secondary | ICD-10-CM | POA: Insufficient documentation

## 2022-03-02 DIAGNOSIS — M25552 Pain in left hip: Secondary | ICD-10-CM | POA: Diagnosis present

## 2022-03-02 DIAGNOSIS — M6281 Muscle weakness (generalized): Secondary | ICD-10-CM | POA: Diagnosis present

## 2022-03-02 DIAGNOSIS — M25551 Pain in right hip: Secondary | ICD-10-CM | POA: Insufficient documentation

## 2022-03-02 NOTE — Therapy (Signed)
OUTPATIENT PHYSICAL THERAPY TREATMENT NOTE   Patient Name: Carolyn Esparza MRN: [id number] DOB:January 26, 1966, 56 y.o., female Today's Date: 03/02/2022  PCP: [name], [name], MD REFERRING PROVIDER: Raelene Bott, PA  END OF SESSION:   PT End of Session - 03/02/22 1353     Visit Number 9    Number of Visits 24    Date for PT Re-Evaluation 04/20/22    Authorization Type [employer] reporting period from 01/26/2022    Authorization Time Period VL: max combined 60 PT/OT per year    Authorization - Visit Number 8    Authorization - Number of Visits 60    Progress Note Due on Visit 10    PT Start Time 1352    PT Stop Time 1430    PT Time Calculation (min) 38 min    Activity Tolerance Patient tolerated treatment well    Behavior During Therapy WFL for tasks assessed/performed                 Past Medical History:  Diagnosis Date   Anxiety    ASCUS with positive high risk HPV cervical    CAD (coronary artery disease)    Chicken pox    Chronic pain    neck, back, b/l hips    Chronic pain of right hip 7/82/9562   Complication of anesthesia    DDD (degenerative disc disease), lumbar 01/06/2021   Depression    Facet arthritis of lumbar region 01/06/2021   GERD (gastroesophageal reflux disease)    History of Crohn's disease    Hyperlipidemia    Left ovarian cyst    s/p removal of 1 ovary ? which one removed per pt    Libido, decreased    Skin cancer    reports skin cancer removed from skin in 2016 or 2017   Thyroid nodule    repeat US resolved and Ct neck 05/2019 normal thyroid    Past Surgical History:  Procedure Laterality Date   ABDOMINAL HYSTERECTOMY     APPENDECTOMY  2004   BLADDER SURGERY     BREAST BIOPSY     x 2   COLONOSCOPY     COLONOSCOPY WITH PROPOFOL N/A 10/25/2020   Procedure: COLONOSCOPY WITH PROPOFOL;  Surgeon: Mauri Pole, MD;  Location: [facility];  Service: Endoscopy;  Laterality: N/A;   HEMOSTASIS CLIP PLACEMENT  10/25/2020   Procedure:  HEMOSTASIS CLIP PLACEMENT;  Surgeon: Mauri Pole, MD;  Location: [facility];  Service: Endoscopy;;   OOPHORECTOMY Right    Unilateral Right (per MRI 04/17/2018)   POLYPECTOMY  10/25/2020   Procedure: POLYPECTOMY;  Surgeon: Mauri Pole, MD;  Location: [facility];  Service: Endoscopy;;   SUBMUCOSAL LIFTING INJECTION  10/25/2020   Procedure: SUBMUCOSAL LIFTING INJECTION;  Surgeon: Mauri Pole, MD;  Location: [facility];  Service: Endoscopy;;   WISDOM TOOTH EXTRACTION     Patient Active Problem List   Diagnosis Date Noted   Cervicalgia 07/01/2021   Low back pain 07/01/2021   Herpes infection 07/01/2021   Chronic pain of right hip 04/28/2021   DDD (degenerative disc disease), lumbar 01/06/2021   Facet arthritis of lumbar region 01/06/2021   Trochanteric bursitis of both hips 11/30/2020   Special screening for malignant neoplasms, colon    Polyp of cecum    Pain in both feet 04/12/2020   Burning sensation of feet 04/12/2020   Pain of both hip joints 04/12/2020   Onychomycosis 04/12/2020   Fibrocystic breast disease (FCBD) 04/12/2020  Numbness in both legs 09/09/2019   Spinal stenosis in cervical region 05/09/2019   Facet arthropathy, cervical 05/09/2019   Chronic pain 11/27/2018   Obesity (BMI 30.0-34.9) 11/27/2018   Sprain and strain of hip and thigh 05/10/2018   Abnormal MRI, cervical spine 03/28/2018   Menopause 11/26/2017   Right upper quadrant pain 11/26/2017   Abnormal Pap smear of cervix 07/27/2017   Vasomotor flushing 07/19/2017   Depression 07/18/2017   Nausea 06/25/2017   Chronic pain of both hips 06/25/2017   OSA (obstructive sleep apnea) 04/18/2017   Annual physical exam 04/10/2017   Fatigue 12/16/2016   Chronic back pain 05/30/2016   Abdominal pain 03/12/2016   Anxiety and depression 12/28/2015   History of obstructive sleep apnea 12/15/2015   GERD (gastroesophageal reflux disease) 12/15/2015   Hyperlipidemia 12/15/2015   Pain  medication agreement signed 03/25/2015   CAD in native artery 02/05/2015   Right supraspinatus tenosynovitis 08/17/2014   Crohn's disease involving terminal ileum ([place]) 03/30/2014   FH: colon polyps 03/30/2014   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 female who presents to outpatient physical therapy with a referral for medical diagnosis tear of right gluteus medius tendon, trochanteric bursitis of both hips, enthesopathy of hip region on both sides. This patient's chief complaints consist of right hip pain, stiffness, weakness, abnormal gait, difficulty with balance, and dysfunction s/p OPEN REPAIR R GLUTEUS MEDIUS gluteal repair 12/05/2021, leading to the following functional deficits: difficulty with usual activities and basic mobility including household and community ambulation, transfers, walking her dogs, ADLs, IADLs, going to the beach, stairs, sleeping, carrying, lifting, bending, doing activities that require weight bearing without loss of balance, walking the dogs. 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  Patient denies hx of stroke, seizures, lung problems, diabetes, unexplained  weight loss, unexplained changes in bowel or bladder problems, unexplained stumbling or dropping things, osteoporosis, and spinal surgery.  PRECAUTIONS: Other: can transition to weight bearing as tolerated.  See protocol in chart.  SUBJECTIVE: Patient arrives with [facility]. States she is tired and her hip hurts like she walked too much on Saturday when she got back from her trip. She states she uses the cane in the community and RW in the house. She had pain after last PT session and did not do her HEP on Tuesday (day after PT) and did part of it yesterday. She feels it was irritated before she got to PT on Monday.   PAIN:  Are you having pain? Yes, 7/10 right lateral hip  OBJECTIVE:   TODAY'S TREATMENT:  Therapeutic exercise: to centralize symptoms and improve ROM, strength, muscular endurance, and activity tolerance required for successful completion of functional activities.  - NuStep level 3 using bilateral upper and lower  extremities. Seat/handle setting 6/8. For improved extremity mobility, muscular endurance, and activity tolerance; and to induce the analgesic effect of aerobic exercise, stimulate improved joint nutrition, and prepare body structures and systems for following interventions. x 6:48  minutes. Average SPM = 65    Circuit: - standing R hip abduction with 2#AW 1x10. YTB around ankles 2x10. - standing R hip flexion with knee extended, 2#AW 1x10. YTB around ankles 2x10. - standing hip extension with knee extended, bent forwards to decrease lumbar extension,  2#AW 1x10. YTB around ankles 2x10.  - ambulation with SPC in front of mirror 4x20 feet to improve weight shift and decrease L hip drop during ambulation. VC/TC to improve alignment.    - standing B hip ER while standing on rotation discx, YTB around toes, 3x10.   - SLS on RLE with contralateral UE support, 1x2, 10-15 sec - modified SLS with left foot on half spike ball with TC/VC to keep left side of pelvis lifted while  balancing on R LE. 1x8 with 10-15 second hold. (Patient requested discontinuing due to fatigue/pain at right hip, no worse after resting).   Pt required multimodal cuing for proper technique and to facilitate improved neuromuscular control, strength, range of motion, and functional ability resulting in improved performance and form.    PATIENT EDUCATION:  Education details:  Exercise purpose/form. Self management techniques. HEP including handout  Reviewed cancelation/no-show policy with patient and confirmed patient has correct phone number for clinic; patient verbalized understanding (02/02/22). Person educated: Patient Education method: Explanation, demonstration, multimodal cuing.  Education comprehension: verbalized and demonstrated understanding and needs further education     HOME EXERCISE PROGRAM: Access Code: [email or web address] URL: [email or web address] Date: 02/16/2022 Prepared by: Rosita Kea  Exercises - Sit to Stand Without Arm Support  - 1 x daily - 2 sets - 10 reps - Supine Bridge with Resistance Band  - 1-2 x daily - 1 sets - 20 reps - 5 seconds hold - Standing Hip Abduction with Counter Support  - 1 x daily - 3 sets - 10 reps - 1-4 seconds hold - Standing Hip Flexion AROM  - 1 x daily - 3 sets - 10 reps - 1-4 seconds hold - Prone Hip Extension on Table  - 1 x daily - 3 sets - 10 reps - 1-4 seconds hold   ASSESSMENT:   CLINICAL IMPRESSION: Patient arrives with continued elevation of pain and difficulty with consistent HEP due to pain after her usual activities and PT session. She continues to be anxious to get rid of her assistive device but continues to be very limited in tolerance and stability for standing on R LE. Continued working on R hip lateral strength and coordination as tolerated with updates to HEP as appropriate. Patient continues to lack R hip strength and stability for single leg stance including during gait. She reported no increase in pain by end of  session. Plan to complete progress note at next visit which is the 10th visit. Patient would benefit from continued management of limiting condition by skilled physical therapist to address remaining impairments and functional limitations to work towards stated goals and return to PLOF or maximal functional independence.    From eval: Patient is a 56 y.o. female referred to outpatient physical therapy with a medical diagnosis of tear of right gluteus medius tendon, trochanteric bursitis of both hips, enthesopathy of hip region on both sides who presents with signs and symptoms consistent with right hip pain, stiffness, weakness, dysfunction, imbalance, gait disturbance  s/p OPEN REPAIR R GLUTEUS MEDIUS gluteal repair 12/05/2021 and . Patient presents with significant pain, ROM, posture, joint stiffness, tissue integrity, motor control, flexibility, gait, balance, muscle performance (strength/power/endurance) and activity tolerance impairments that are limiting ability to complete her usual activities and basic mobility including household and community ambulation, transfers, walking her dogs, ADLs, IADLs, going to the beach, stairs, sleeping, carrying, lifting, bending, doing activities that require weight bearing without loss of balance, walking the dogs without difficulty. Patient will benefit from skilled physical therapy intervention to address current body structure impairments and activity limitations to improve function and work towards goals set in current POC in order to return to prior level of function or maximal functional improvement.    OBJECTIVE IMPAIRMENTS Abnormal gait, decreased activity tolerance, decreased balance, decreased coordination, decreased endurance, decreased knowledge of use of DME, decreased mobility, difficulty walking, decreased ROM, decreased strength, impaired perceived functional ability, impaired flexibility, improper body mechanics, postural dysfunction, and pain.     ACTIVITY LIMITATIONS carrying, lifting, bending, sitting, standing, squatting, sleeping, stairs, transfers, bed mobility, dressing, hygiene/grooming, locomotion level, and caring for others   PARTICIPATION LIMITATIONS: meal prep, cleaning, laundry, interpersonal relationship, driving, shopping, community activity, and   basic mobility including household and community ambulation, transfers, walking her dogs, ADLs, IADLs, going to the beach, stairs, sleeping, carrying, lifting, bending, doing activities that require weight bearing without loss of balance, walking the dogs.   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    REHAB POTENTIAL: Good   CLINICAL DECISION MAKING: Stable/uncomplicated   EVALUATION COMPLEXITY: Low     GOALS: Goals reviewed with patient? No   SHORT TERM GOALS: Target date: 02/09/2022   Patient will be independent with initial home exercise program for self-management of symptoms. Baseline: Initial HEP to be provided at visit 2 as appropriate (01/26/22); Goal status: In-progress     LONG TERM GOALS: Target date: 04/20/2022   Patient will be independent with a long-term home exercise program for self-management of symptoms.  Baseline: Initial HEP to be provided at visit 2 as appropriate (01/26/22); Goal status: In-progress   2.  Patient will demonstrate improved FOTO to equal or greater than 54 by visit #16 to demonstrate improvement in overall condition and self-reported functional ability.  Baseline: 30 (01/26/22); Goal status: In-progress    3.  Patient will demonstrate R sided single leg stance of equal or greater than 30 seconds with no trendelenburg compensation to decrease fall risk and improve household and community ambulation.  Baseline: 0 seconds (01/26/22); Goal status: In-progress   4.  Patient will ambulate equal or greater than 1000 feet with no AD during 6 min walk test to improve community ambulation.  Baseline: not tested (01/26/22); Goal status: In-progress   5.  Patient will complete community, work and/or recreational activities without limitation due to current condition.  Baseline: difficulty with usual activities including basic mobility including household and community ambulation, transfers, walking her dogs, ADLs, IADLs, going to the beach, stairs, sleeping, carrying, lifting, bending, doing activities that require weight bearing without loss of balance, walking the dogs (01/26/22); Goal status: In-progress   6.  Patient will demonstrate B hip  abduction MMT equal or greater than 4+/5 with no increase in pain to improve activity tolerance for walking her dog.  Baseline: R = 3/5 with pain, left not tested due to lack of tolerance for laying on R side (01/26/2022);   Goal status: In-progress       PLAN: PT FREQUENCY: 1-2x/week   PT DURATION: 12 weeks   PLANNED INTERVENTIONS: Therapeutic exercises, Therapeutic activity, Neuromuscular re-education, Balance training, Gait training, Patient/Family education, Joint mobilization, Stair training, DME instructions, Aquatic Therapy, Dry Needling, Electrical stimulation, Spinal mobilization, Cryotherapy, Moist heat, Manual therapy, and Re-evaluation   PLAN FOR NEXT SESSION: update HEP as appropriate, progressive strengthening, ROM, gait training, and balance within protocol guidelines. Work towards ability to tolerate R SLS.    Everlean Alstrom. Graylon Good, PT, DPT 03/02/22, 4:02 PM  [facility] [address] [place], [place] [address] P:  [phone] I F: [phone]

## 2022-03-06 ENCOUNTER — Ambulatory Visit: Payer: Managed Care, Other (non HMO) | Admitting: Physical Therapy

## 2022-03-06 ENCOUNTER — Encounter: Payer: Self-pay | Admitting: Physical Therapy

## 2022-03-06 DIAGNOSIS — M25551 Pain in right hip: Secondary | ICD-10-CM

## 2022-03-06 DIAGNOSIS — R2681 Unsteadiness on feet: Secondary | ICD-10-CM

## 2022-03-06 DIAGNOSIS — M25552 Pain in left hip: Secondary | ICD-10-CM

## 2022-03-06 DIAGNOSIS — M6281 Muscle weakness (generalized): Secondary | ICD-10-CM

## 2022-03-06 DIAGNOSIS — R262 Difficulty in walking, not elsewhere classified: Secondary | ICD-10-CM

## 2022-03-06 NOTE — Therapy (Signed)
OUTPATIENT PHYSICAL THERAPY TREATMENT / PROGRESS NOTE Dates of reporting from 01/26/2022 to 03/06/2022   Patient Name: Carolyn Esparza MRN: [id number] DOB:[birth year], 56 y.o., female Today's Date: 03/06/2022  PCP: [name], Pasty Spillers, MD REFERRING PROVIDER: Kendrick Fries, PA  END OF SESSION:   PT End of Session - 03/06/22 1854     Visit Number 10    Number of Visits 24    Date for PT Re-Evaluation 04/20/22    Authorization Type [employer] reporting period from 01/26/2022    Authorization Time Period VL: max combined 60 PT/OT per year    Authorization - Visit Number 9    Authorization - Number of Visits 60    Progress Note Due on Visit 10    PT Start Time 1435    PT Stop Time 1515    PT Time Calculation (min) 40 min    Activity Tolerance Patient limited by fatigue;Patient limited by pain;Patient tolerated treatment well    Behavior During Therapy [facility] for tasks assessed/performed             Past Medical History:  Diagnosis Date   Anxiety    ASCUS with positive high risk HPV cervical    CAD (coronary artery disease)    Chicken pox    Chronic pain    neck, back, b/l hips    Chronic pain of right hip 04/28/2021   Complication of anesthesia    DDD (degenerative disc disease), lumbar 01/06/2021   Depression    Facet arthritis of lumbar region 01/06/2021   GERD (gastroesophageal reflux disease)    History of Crohn's disease    Hyperlipidemia    Left ovarian cyst    s/p removal of 1 ovary ? which one removed per pt    Libido, decreased    Skin cancer    reports skin cancer removed from skin in 2016 or 2017   Thyroid nodule    repeat US resolved and Ct neck 05/2019 normal thyroid    Past Surgical History:  Procedure Laterality Date   ABDOMINAL HYSTERECTOMY     APPENDECTOMY  2004   BLADDER SURGERY     BREAST BIOPSY     x 2   COLONOSCOPY     COLONOSCOPY WITH PROPOFOL N/A 10/25/2020   Procedure: COLONOSCOPY WITH PROPOFOL;  Surgeon: Napoleon Form, MD;  Location: [facility]  ENDOSCOPY;  Service: Endoscopy;  Laterality: N/A;   HEMOSTASIS CLIP PLACEMENT  10/25/2020   Procedure: HEMOSTASIS CLIP PLACEMENT;  Surgeon: Napoleon Form, MD;  Location: [facility] ENDOSCOPY;  Service: Endoscopy;;   OOPHORECTOMY Right    Unilateral Right (per MRI 04/17/2018)   POLYPECTOMY  10/25/2020   Procedure: POLYPECTOMY;  Surgeon: Napoleon Form, MD;  Location: [facility] ENDOSCOPY;  Service: Endoscopy;;   SUBMUCOSAL LIFTING INJECTION  10/25/2020   Procedure: SUBMUCOSAL LIFTING INJECTION;  Surgeon: Napoleon Form, MD;  Location: [facility] ENDOSCOPY;  Service: Endoscopy;;   WISDOM TOOTH EXTRACTION     Patient Active Problem List   Diagnosis Date Noted   Cervicalgia 07/01/2021   Low back pain 07/01/2021   Herpes infection 07/01/2021   Chronic pain of right hip 04/28/2021   DDD (degenerative disc disease), lumbar 01/06/2021   Facet arthritis of lumbar region 01/06/2021   Trochanteric bursitis of both hips 11/30/2020   Special screening for malignant neoplasms, colon    Polyp of cecum    Pain in both feet 04/12/2020   Burning sensation of feet 04/12/2020   Pain of both hip joints 04/12/2020  Onychomycosis 04/12/2020   Fibrocystic breast disease (FCBD) 04/12/2020   Numbness in both legs 09/09/2019   Spinal stenosis in cervical region 05/09/2019   Facet arthropathy, cervical 05/09/2019   Chronic pain 11/27/2018   Obesity (BMI 30.0-34.9) 11/27/2018   Sprain and strain of hip and thigh 05/10/2018   Abnormal MRI, cervical spine 03/28/2018   Menopause 11/26/2017   Right upper quadrant pain 11/26/2017   Abnormal Pap smear of cervix 07/27/2017   Vasomotor flushing 07/19/2017   Depression 07/18/2017   Nausea 06/25/2017   Chronic pain of both hips 06/25/2017   OSA (obstructive sleep apnea) 04/18/2017   Annual physical exam 04/10/2017   Fatigue 12/16/2016   Chronic back pain 05/30/2016   Abdominal pain 03/12/2016   Anxiety and depression 12/28/2015   History of obstructive sleep apnea  12/15/2015   GERD (gastroesophageal reflux disease) 12/15/2015   Hyperlipidemia 12/15/2015   Pain medication agreement signed 03/25/2015   CAD in native artery 02/05/2015   Right supraspinatus tenosynovitis 08/17/2014   Crohn's disease involving terminal ileum ([place]) 03/30/2014   FH: colon polyps 03/30/2014   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 female who presents to outpatient physical therapy with a referral for medical diagnosis tear of right gluteus medius tendon, trochanteric bursitis of both hips, enthesopathy of hip region on both sides. This patient's chief complaints consist of right hip pain, stiffness, weakness, abnormal gait, difficulty with balance, and dysfunction s/p OPEN REPAIR R GLUTEUS MEDIUS gluteal repair 12/05/2021, leading to the following functional deficits: difficulty with usual activities and basic mobility including household and community ambulation, transfers, walking her dogs, ADLs, IADLs, going to the beach, stairs, sleeping, carrying, lifting, bending, doing activities that require weight bearing without loss of balance, walking the dogs. 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  Patient denies hx of stroke, seizures, lung problems, diabetes, unexplained weight loss, unexplained changes in bowel or bladder problems, unexplained stumbling or dropping things, osteoporosis, and spinal surgery.  PRECAUTIONS: Other: can transition to weight bearing as tolerated.  See protocol in chart.  SUBJECTIVE: Patient arrives with [facility]. States she was expecting to be further along in her rehab by now. She states she finds herself going further at home without her walker. She did her HEP reliably since last PT session, only taking one day off. She felt okay after last PT session. She forgot to use bands during her HEP.   PAIN:  Are you having pain? Yes, 3/10 right lateral hip  OBJECTIVE  SELF-REPORTED FUNCTION FOTO score: 58/100 (upper leg questionnaire)  MUSCLE PERFORMANCE (MMT):  *Indicates pain 01/26/22 03/06/22 Date  Joint/Motion R/L R/L R/L  Hip        Flexion (L1, L2) 3+/4+ 5/5 /  Extension (knee ext)  4/4+ 4+/4+ /  Abduction 3*/ 4*/4+ /  Adduction / / /  External rotation / / /  Internal rotation  / / /  Knee        Extension (L3) 5/5 / /  Flexion (S2) 5/5 / /  Ankle/Foot        Dorsiflexion (L4) 5/5 / /  Great toe extension (L5) 4+/4+ / /  Eversion (S1) 5/5 / /  Comments:    FUNCTIONAL/BALANCE EXERCISES 6 Minute Walk Test: 617 feet with SPC. Altered gait pattern with trendelenburg pattern on R hip. Increased soreness.  Single leg stance, firm surface, eyes open: R= 20 seconds with significant trendelenburg, L= > 30 seconds.   TODAY'S TREATMENT:  Therapeutic exercise: to centralize symptoms and improve ROM, strength, muscular endurance, and activity tolerance required for successful completion of functional activities.  - Ambulation around clinic with [facility] for distance in 6 minutes to assess progress (see 6 Minute Walk Test above).   - single leg stance 1 set max time each side to assess progress (see above).  - modified SLS with left foot on  stool while standing on front of mirror with tband around waist to show hip alignment with TC/VC to keep left side of pelvis lifted while balancing on R LE. 1x5 with 10 second hold. (Discontinued to prevent excessive fatigue prior to MMT).  - MMT to hips to assess progress (See above).  - sidelying R hip abduction AROM, 2x10 (patient requested to stop after 2nd set stating she felt that is all she could do, reported discomfort and cramping at R glute med region that she feels may cause her a lot of pain tomorrow).  - updated HEP including review of exercises, education on protocol, progress, POC.   Pt required multimodal cuing for proper technique and to facilitate improved neuromuscular control, strength, range of motion, and functional ability resulting in improved performance and form.    PATIENT EDUCATION:  Education details:  Exercise purpose/form. Self management techniques. HEP including handout, POC, progress, need for mastery of strength exercises before functional improvement/safety for ambulating with no AD or walking dogs is possible.  Reviewed cancelation/no-show policy with patient and confirmed patient has correct phone number for clinic; patient verbalized understanding (02/02/22). Person educated: Patient Education method: Explanation, demonstration, multimodal cuing.  Education comprehension: verbalized and demonstrated understanding and needs further education     HOME EXERCISE PROGRAM: Access Code: [email or web address] URL: [email or web address] Date: 03/06/2022 Prepared by: Rosita Kea  Exercises - Sit to Stand Without Arm Support  - 1 x daily - 2 sets - 10 reps - Supine Bridge with Resistance Band  - 1 x daily - 1 sets - 20 reps - 5 seconds hold - Standing Hip Abduction with Resistance at Ankles and Unilateral Counter Support  - 1 x daily - 3 sets - 10 reps - 1-4 second hold hold - Standing Hip Flexion with Resistance Loop  - 1 x daily - 3 sets - 10 reps - 1-4  seconds hold - Standing Hip Extension with Resistance at Ankles and Counter Support  - 1 x daily - 3 sets - 10 reps - 1-4 seconds hold - Single Leg Balance in March Position  - 1 x daily - 2-3 sets - 8-10 reps - 10 seconds hold   ASSESSMENT:   CLINICAL IMPRESSION: Patient has attended 10 physical therapy sessions since starting the current episode of care on 01/26/2022. She is making gradual progress towards her goals but is still unable to perform  SLS on R LE without significant trendelenburg. She has mostly transitioned to [facility] per her preference but also ambulates with significant trendelenburg gait. Patient appears anxious to get off AD and walk her dogs and seems frustrated by slow progress. However, she has had difficulty tolerating exercises that load the R glute med/min resulting in low reps and set ability and sometimes feeling too flared up to perform her HEP after PT. Calibrating her exercises has been challenging.  Patient continues to be educated on the criteria for ambulation without SPC and the need for her to develop improved strength and endurance in single leg stance before attempting more difficult functional activities. Patient's lack of regular physical activity, fibromyalgia, and chronic fatigue symptoms likely play a role in her difficulty with rehabilitation exercises. Most recently patient has been tolerating standing open chain exercises for R hip with light resistance and modified (off-weighted) closed chain R hip SLS stance exercise to improve hip stability and strength. Plan to continue working on improving hip strength, stability, and activity tolerance through gradual progressive loading. Patient would benefit from continued management of limiting condition by skilled physical therapist to address remaining impairments and functional limitations to work towards stated goals and return to PLOF or maximal functional independence.   From eval: Patient is a 56 y.o. female referred  to outpatient physical therapy with a medical diagnosis of tear of right gluteus medius tendon, trochanteric bursitis of both hips, enthesopathy of hip region on both sides who presents with signs and symptoms consistent with right hip pain, stiffness, weakness, dysfunction, imbalance, gait disturbance s/p OPEN REPAIR R GLUTEUS MEDIUS gluteal repair 12/05/2021 and . Patient presents with significant pain, ROM, posture, joint stiffness, tissue integrity, motor control, flexibility, gait, balance, muscle performance (strength/power/endurance) and activity tolerance impairments that are limiting ability to complete her usual activities and basic mobility including household and community ambulation, transfers, walking her dogs, ADLs, IADLs, going to the beach, stairs, sleeping, carrying, lifting, bending, doing activities that require weight bearing without loss of balance, walking the dogs without difficulty. Patient will benefit from skilled physical therapy intervention to address current body structure impairments and activity limitations to improve function and work towards goals set in current POC in order to return to prior level of function or maximal functional improvement.    OBJECTIVE IMPAIRMENTS Abnormal gait, decreased activity tolerance, decreased balance, decreased coordination, decreased endurance, decreased knowledge of use of DME, decreased mobility, difficulty walking, decreased ROM, decreased strength, impaired perceived functional ability, impaired flexibility, improper body mechanics, postural dysfunction, and pain.    ACTIVITY LIMITATIONS carrying, lifting, bending, sitting, standing, squatting, sleeping, stairs, transfers, bed mobility, dressing, hygiene/grooming, locomotion level, and caring for others   PARTICIPATION LIMITATIONS: meal prep, cleaning, laundry, interpersonal relationship, driving, shopping, community activity, and   basic mobility including household and community  ambulation, transfers, walking her dogs, ADLs, IADLs, going to the beach, stairs, sleeping, carrying, lifting, bending, doing activities that require weight bearing without loss of balance, walking the dogs.   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    REHAB POTENTIAL: Good   CLINICAL DECISION MAKING: Stable/uncomplicated   EVALUATION COMPLEXITY: Low  GOALS: Goals reviewed with patient? No   SHORT TERM GOALS: Target date: 02/09/2022   Patient will be independent with initial home exercise program for self-management of symptoms. Baseline: Initial HEP to be provided at visit 2 as appropriate (01/26/22); participates as able but struggles to get reps/sets in due to fatigue/soreness (03/06/2022); Goal status: partially met     LONG TERM GOALS: Target date: 04/20/2022   Patient will be independent with a long-term home exercise program for self-management of symptoms.  Baseline: Initial HEP to be provided at visit 2 as appropriate (01/26/22); participates as able but struggles to get reps/sets in due to fatigue/soreness (03/06/2022); Goal status: In-progress   2.  Patient will demonstrate improved FOTO to equal or greater than 54 by visit #16 to demonstrate improvement in overall condition and self-reported functional ability.  Baseline: 30 (01/26/22); 58 at visit #10 (03/06/2022);  Goal status: In-progress   3.  Patient will demonstrate R sided single leg stance of equal or greater than 30 seconds with no trendelenburg compensation to decrease fall risk and improve  household and community ambulation.  Baseline: 0 seconds (01/26/22); 20 seconds with significant trendelenburg, unable to hold without trendelenburg (03/06/2022); Goal status: In-progress   4.  Patient will ambulate equal or greater than 1000 feet with no AD during 6 min walk test to improve community ambulation.  Baseline: not tested (01/26/22); 617 feet with SPC. Altered gait pattern with trendelenburg pattern on R hip. Increased soreness (03/06/2022);  Goal status: In-progress   5.  Patient will complete community, work and/or recreational activities without limitation due to current condition.  Baseline: difficulty with usual activities including basic mobility including household and community ambulation, transfers, walking her dogs, ADLs, IADLs, going to the beach, stairs, sleeping, carrying, lifting, bending, doing activities that require weight bearing without loss of balance(01/26/22); reports she still cannot walk the dogs, is able to go up and down a few steps, continues to have difficulty with similar limitations with mild improvement (03/06/2022);  Goal status: In-progress   6.  Patient will demonstrate B hip abduction MMT equal or greater than 4+/5 with no increase in pain to improve activity tolerance for walking her dog.  Baseline: R = 3/5 with pain, left not tested due to lack of tolerance for laying on R side (01/26/2022);  R = 4/5 with pain (patient also fatigues by 10 reps and is unable to do more than 2 sets of 10 reps of dynamic hip abduction), L = 4+/5 (03/06/2022);  Goal status: In-progress       PLAN: PT FREQUENCY: 1-2x/week   PT DURATION: 12 weeks   PLANNED INTERVENTIONS: Therapeutic exercises, Therapeutic activity, Neuromuscular re-education, Balance training, Gait training, Patient/Family education, Joint mobilization, Stair training, DME instructions, Aquatic Therapy, Dry Needling, Electrical stimulation, Spinal mobilization, Cryotherapy, Moist heat, Manual therapy, and  Re-evaluation   PLAN FOR NEXT SESSION: update HEP as appropriate, progressive strengthening, ROM, gait training, and balance within protocol guidelines. Work towards ability to tolerate R SLS.    Everlean Alstrom. Graylon Good, PT, DPT 03/06/22, 7:12 PM  [facility] [address] [place], [place] [address] P: [phone] I F: [phone]

## 2022-03-08 ENCOUNTER — Encounter: Payer: Self-pay | Admitting: Physical Therapy

## 2022-03-08 ENCOUNTER — Ambulatory Visit: Payer: Managed Care, Other (non HMO) | Admitting: Physical Therapy

## 2022-03-08 DIAGNOSIS — M25551 Pain in right hip: Secondary | ICD-10-CM

## 2022-03-08 DIAGNOSIS — R2681 Unsteadiness on feet: Secondary | ICD-10-CM

## 2022-03-08 DIAGNOSIS — M6281 Muscle weakness (generalized): Secondary | ICD-10-CM

## 2022-03-08 DIAGNOSIS — R262 Difficulty in walking, not elsewhere classified: Secondary | ICD-10-CM

## 2022-03-08 DIAGNOSIS — M25552 Pain in left hip: Secondary | ICD-10-CM

## 2022-03-08 NOTE — Therapy (Addendum)
OUTPATIENT PHYSICAL THERAPY TREATMENT NOTE   Patient Name: Carolyn Esparza MRN: [id number] DOB:04/10/1966, 56 y.o., female Today's Date: 03/08/2022  PCP: [name], [name], MD REFERRING PROVIDER: Raelene Bott, PA  END OF SESSION:   PT End of Session - 03/08/22 1508     Visit Number 11    Number of Visits 24    Date for PT Re-Evaluation 04/20/22    Authorization Type [employer] reporting period from 03/06/2022    Authorization Time Period VL: max combined 60 PT/OT per year    Authorization - Visit Number 10    Authorization - Number of Visits 60    Progress Note Due on Visit 20    PT Start Time 6606    PT Stop Time 1512    PT Time Calculation (min) 38 min    Activity Tolerance Patient limited by fatigue;Patient limited by pain;Patient tolerated treatment well    Behavior During Therapy [facility] for tasks assessed/performed              Past Medical History:  Diagnosis Date   Anxiety    ASCUS with positive high risk HPV cervical    CAD (coronary artery disease)    Chicken pox    Chronic pain    neck, back, b/l hips    Chronic pain of right hip 0/10/5995   Complication of anesthesia    DDD (degenerative disc disease), lumbar 01/06/2021   Depression    Facet arthritis of lumbar region 01/06/2021   GERD (gastroesophageal reflux disease)    History of Crohn's disease    Hyperlipidemia    Left ovarian cyst    s/p removal of 1 ovary ? which one removed per pt    Libido, decreased    Skin cancer    reports skin cancer removed from skin in 2016 or 2017   Thyroid nodule    repeat US resolved and Ct neck 05/2019 normal thyroid    Past Surgical History:  Procedure Laterality Date   ABDOMINAL HYSTERECTOMY     APPENDECTOMY  2004   BLADDER SURGERY     BREAST BIOPSY     x 2   COLONOSCOPY     COLONOSCOPY WITH PROPOFOL N/A 10/25/2020   Procedure: COLONOSCOPY WITH PROPOFOL;  Surgeon: Mauri Pole, MD;  Location: [facility];  Service: Endoscopy;  Laterality: N/A;    HEMOSTASIS CLIP PLACEMENT  10/25/2020   Procedure: HEMOSTASIS CLIP PLACEMENT;  Surgeon: Mauri Pole, MD;  Location: [facility];  Service: Endoscopy;;   OOPHORECTOMY Right    Unilateral Right (per MRI 04/17/2018)   POLYPECTOMY  10/25/2020   Procedure: POLYPECTOMY;  Surgeon: Mauri Pole, MD;  Location: [facility];  Service: Endoscopy;;   SUBMUCOSAL LIFTING INJECTION  10/25/2020   Procedure: SUBMUCOSAL LIFTING INJECTION;  Surgeon: Mauri Pole, MD;  Location: [facility];  Service: Endoscopy;;   WISDOM TOOTH EXTRACTION     Patient Active Problem List   Diagnosis Date Noted   Cervicalgia 07/01/2021   Low back pain 07/01/2021   Herpes infection 07/01/2021   Chronic pain of right hip 04/28/2021   DDD (degenerative disc disease), lumbar 01/06/2021   Facet arthritis of lumbar region 01/06/2021   Trochanteric bursitis of both hips 11/30/2020   Special screening for malignant neoplasms, colon    Polyp of cecum    Pain in both feet 04/12/2020   Burning sensation of feet 04/12/2020   Pain of both hip joints 04/12/2020   Onychomycosis 04/12/2020   Fibrocystic breast disease (FCBD)  04/12/2020   Numbness in both legs 09/09/2019   Spinal stenosis in cervical region 05/09/2019   Facet arthropathy, cervical 05/09/2019   Chronic pain 11/27/2018   Obesity (BMI 30.0-34.9) 11/27/2018   Sprain and strain of hip and thigh 05/10/2018   Abnormal MRI, cervical spine 03/28/2018   Menopause 11/26/2017   Right upper quadrant pain 11/26/2017   Abnormal Pap smear of cervix 07/27/2017   Vasomotor flushing 07/19/2017   Depression 07/18/2017   Nausea 06/25/2017   Chronic pain of both hips 06/25/2017   OSA (obstructive sleep apnea) 04/18/2017   Annual physical exam 04/10/2017   Fatigue 12/16/2016   Chronic back pain 05/30/2016   Abdominal pain 03/12/2016   Anxiety and depression 12/28/2015   History of obstructive sleep apnea 12/15/2015   GERD (gastroesophageal reflux disease)  12/15/2015   Hyperlipidemia 12/15/2015   Pain medication agreement signed 03/25/2015   CAD in native artery 02/05/2015   Right supraspinatus tenosynovitis 08/17/2014   Crohn's disease involving terminal ileum ([place]) 03/30/2014   FH: colon polyps 03/30/2014   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 female who presents to outpatient physical therapy with a referral for medical diagnosis tear of right gluteus medius tendon, trochanteric bursitis of both hips, enthesopathy of hip region on both sides. This patient's chief complaints consist of right hip pain, stiffness, weakness, abnormal gait, difficulty with balance, and dysfunction s/p OPEN REPAIR R GLUTEUS MEDIUS gluteal repair 12/05/2021, leading to the following functional deficits: difficulty with usual activities and basic mobility including household and community ambulation, transfers, walking her dogs, ADLs, IADLs, going to the beach, stairs, sleeping, carrying, lifting, bending, doing activities that require weight bearing without loss of balance, walking the dogs. 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  Patient denies hx of stroke,  seizures, lung problems, diabetes, unexplained weight loss, unexplained changes in bowel or bladder problems, unexplained stumbling or dropping things, osteoporosis, and spinal surgery.  PRECAUTIONS: Other: can transition to weight bearing as tolerated.  See protocol in chart.  SUBJECTIVE: Patient arrives with [facility]. She states she is sore today after doing 2 sets of her exercises except the standing exercise yesterday.   PAIN:  Are you having pain? Yes, 5/10 right lateral hip  OBJECTIVE  TODAY'S TREATMENT:  Therapeutic exercise: to centralize symptoms and improve ROM, strength, muscular endurance, and activity tolerance required for successful completion of functional activities.  - modified SLS with left foot on stool while standing on front of mirror with tband around waist to show hip alignment with TC/VC to keep left side of pelvis lifted while balancing on R LE. 1x10 with 10 second hold.  - standing B hip ER with heels on rotation discs and YTB loop around forefeet, 4x10 with B UE  support.  - ambulation 4x20 feet with SPC and exaggerating L hip hike on R stance phase. TC/VC to improve alignment.   Circuit: - sidelying R hip abduction AROM, 3x10  - hooklying posterior pelvic tilt with marching 1x10 each side - hooklying piliates single leg stretch without use of hands or curl up, 2x10 each side.   - sit <> stand with staggered stance to preferentially load R LE, from 19 inch surface,  3x10  Pt required multimodal cuing for proper technique and to facilitate improved neuromuscular control, strength, range of motion, and functional ability resulting in improved performance and form.    PATIENT EDUCATION:  Education details:  Exercise purpose/form. Self management techniques. HEP including handout, POC, progress, need for mastery of strength exercises before functional improvement/safety for ambulating with no AD or walking dogs is possible.  Reviewed cancelation/no-show policy with  patient and confirmed patient has correct phone number for clinic; patient verbalized understanding (02/02/22). Person educated: Patient Education method: Explanation, demonstration, multimodal cuing.  Education comprehension: verbalized and demonstrated understanding and needs further education     HOME EXERCISE PROGRAM: Access Code: [email or web address] URL: [email or web address] Date: 03/08/2022 Prepared by: Rosita Kea  Exercises - Staggered Sit-to-Stand  - 1 x daily - 3 sets - 10 reps - Supine Bridge with Resistance Band  - 1 x daily - 1 sets - 20 reps - 5 seconds hold - Standing Hip Abduction with Resistance at Ankles and Unilateral Counter Support  - 1 x daily - 3 sets - 10 reps - 1-4 second hold hold - Standing Hip Flexion with Resistance Loop  - 1 x daily - 3 sets - 10 reps - 1-4 seconds hold - Standing Hip Extension with Resistance at Ankles and Counter Support  - 1 x daily - 3 sets - 10 reps - 1-4 seconds hold - Single Leg Balance in March Position  - 1 x daily - 2-3 sets - 8-10 reps - 10 seconds hold   ASSESSMENT:   CLINICAL IMPRESSION: Patient arrives with good tolerance to exercises after last PT session but with increased soreness. Patient appears motivated to push herself more and was able to tolerate more exercises today that focused on lateral right hip stability.  Plan to continue working on right hip strength and stability next session as appropriate. Patient would benefit from continued management of limiting condition by skilled physical therapist to address remaining impairments and functional limitations to work towards stated goals and return to PLOF or maximal functional independence.   From eval: Patient is a 56 y.o. female referred to outpatient physical therapy with a medical diagnosis of tear of right gluteus medius tendon, trochanteric bursitis of both hips, enthesopathy of hip region on both sides who presents with signs and symptoms consistent with right  hip pain, stiffness, weakness, dysfunction, imbalance, gait disturbance s/p OPEN REPAIR R GLUTEUS MEDIUS gluteal repair 12/05/2021 and . Patient presents with significant pain, ROM, posture, joint stiffness, tissue integrity, motor control, flexibility, gait, balance, muscle performance (strength/power/endurance) and activity tolerance impairments that are limiting ability to complete her usual activities and basic mobility including household and community ambulation, transfers, walking her dogs, ADLs, IADLs, going to the beach, stairs, sleeping, carrying, lifting, bending, doing activities that require weight bearing without loss of balance, walking the dogs without difficulty. Patient will benefit from skilled physical therapy intervention to address current body structure impairments and activity limitations to improve function and work towards goals set in current POC in order to return to prior level  of function or maximal functional improvement.    OBJECTIVE IMPAIRMENTS Abnormal gait, decreased activity tolerance, decreased balance, decreased coordination, decreased endurance, decreased knowledge of use of DME, decreased mobility, difficulty walking, decreased ROM, decreased strength, impaired perceived functional ability, impaired flexibility, improper body mechanics, postural dysfunction, and pain.    ACTIVITY LIMITATIONS carrying, lifting, bending, sitting, standing, squatting, sleeping, stairs, transfers, bed mobility, dressing, hygiene/grooming, locomotion level, and caring for others   PARTICIPATION LIMITATIONS: meal prep, cleaning, laundry, interpersonal relationship, driving, shopping, community activity, and   basic mobility including household and community ambulation, transfers, walking her dogs, ADLs, IADLs, going to the beach, stairs, sleeping, carrying, lifting, bending, doing activities that require weight bearing without loss of balance, walking the dogs.   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    REHAB POTENTIAL: Good   CLINICAL DECISION MAKING: Stable/uncomplicated   EVALUATION COMPLEXITY: Low     GOALS: Goals reviewed with patient? No   SHORT TERM GOALS: Target date: 02/09/2022   Patient will be independent with initial home exercise program for self-management of symptoms. Baseline: Initial HEP to be provided at visit 2 as appropriate (01/26/22); participates as able but struggles to get reps/sets in due to fatigue/soreness (03/06/2022); Goal status: partially met     LONG TERM GOALS: Target date: 04/20/2022   Patient will be independent with a long-term home exercise program for self-management of symptoms.  Baseline: Initial HEP to be provided at visit 2 as appropriate (01/26/22); participates as able but struggles to get reps/sets in due to fatigue/soreness (03/06/2022); Goal status: In-progress   2.  Patient will demonstrate improved FOTO to equal or greater than 54 by visit #16 to demonstrate improvement in overall condition and self-reported functional ability.  Baseline: 30 (01/26/22); 58 at visit #10 (03/06/2022);  Goal status: In-progress   3.  Patient will demonstrate R sided single leg stance of equal or greater than 30 seconds with no trendelenburg compensation to decrease fall risk and improve household and community ambulation.  Baseline: 0 seconds (01/26/22); 20 seconds with significant trendelenburg, unable to hold without trendelenburg (03/06/2022); Goal status: In-progress   4.  Patient will ambulate equal or greater than 1000  feet with no AD during 6 min walk test to improve community ambulation.  Baseline: not tested (01/26/22); 617 feet with SPC. Altered gait pattern with trendelenburg pattern on R hip. Increased soreness (03/06/2022);  Goal status: In-progress   5.  Patient will complete community, work and/or recreational activities without limitation due to current condition.  Baseline: difficulty with usual activities including basic mobility including household and community ambulation, transfers, walking her dogs, ADLs, IADLs, going to the beach, stairs, sleeping, carrying, lifting, bending, doing activities that require weight bearing without loss of balance(01/26/22); reports she still cannot walk the dogs, is able to go up and down a few steps, continues to have difficulty with similar limitations with mild improvement (03/06/2022);  Goal status: In-progress   6.  Patient will demonstrate B hip abduction MMT equal or greater than 4+/5 with no increase in pain to improve activity tolerance for walking her dog.  Baseline: R = 3/5 with pain, left not tested  due to lack of tolerance for laying on R side (01/26/2022);  R = 4/5 with pain (patient also fatigues by 10 reps and is unable to do more than 2 sets of 10 reps of dynamic hip abduction), L = 4+/5 (03/06/2022);  Goal status: In-progress       PLAN: PT FREQUENCY: 1-2x/week   PT DURATION: 12 weeks   PLANNED INTERVENTIONS: Therapeutic exercises, Therapeutic activity, Neuromuscular re-education, Balance training, Gait training, Patient/Family education, Joint mobilization, Stair training, DME instructions, Aquatic Therapy, Dry Needling, Electrical stimulation, Spinal mobilization, Cryotherapy, Moist heat, Manual therapy, and Re-evaluation   PLAN FOR NEXT SESSION: update HEP as appropriate, progressive strengthening, ROM, gait training, and balance within protocol guidelines. Work towards ability to tolerate R SLS.    Everlean Alstrom. Graylon Good, PT, DPT 03/08/22, 3:13  PM  [facility] [address] [place], [place] [address] P: [phone] I F: [phone]

## 2022-03-13 ENCOUNTER — Encounter: Payer: Self-pay | Admitting: Physical Therapy

## 2022-03-13 ENCOUNTER — Ambulatory Visit: Payer: Managed Care, Other (non HMO) | Admitting: Physical Therapy

## 2022-03-13 DIAGNOSIS — M6281 Muscle weakness (generalized): Secondary | ICD-10-CM

## 2022-03-13 DIAGNOSIS — M25552 Pain in left hip: Secondary | ICD-10-CM

## 2022-03-13 DIAGNOSIS — M25551 Pain in right hip: Secondary | ICD-10-CM | POA: Diagnosis not present

## 2022-03-13 DIAGNOSIS — R2681 Unsteadiness on feet: Secondary | ICD-10-CM

## 2022-03-13 DIAGNOSIS — R262 Difficulty in walking, not elsewhere classified: Secondary | ICD-10-CM

## 2022-03-13 NOTE — Therapy (Signed)
OUTPATIENT PHYSICAL THERAPY TREATMENT NOTE   Patient Name: Carolyn Esparza MRN: [id number] DOB:11/15/1965, 56 y.o., female Today's Date: 03/13/2022  PCP: [name], [name], MD REFERRING PROVIDER: Raelene Bott, PA  END OF SESSION:   PT End of Session - 03/13/22 1428     Visit Number 12    Number of Visits 24    Date for PT Re-Evaluation 04/20/22    Authorization Type [employer] reporting period from 03/06/2022    Authorization Time Period VL: max combined 60 PT/OT per year    Authorization - Visit Number 10    Authorization - Number of Visits 60    Progress Note Due on Visit 20    PT Start Time 7416    PT Stop Time 1428    PT Time Calculation (min) 40 min    Activity Tolerance Patient limited by fatigue;Patient limited by pain;Patient tolerated treatment well    Behavior During Therapy [facility] for tasks assessed/performed               Past Medical History:  Diagnosis Date   Anxiety    ASCUS with positive high risk HPV cervical    CAD (coronary artery disease)    Chicken pox    Chronic pain    neck, back, b/l hips    Chronic pain of right hip 3/84/5364   Complication of anesthesia    DDD (degenerative disc disease), lumbar 01/06/2021   Depression    Facet arthritis of lumbar region 01/06/2021   GERD (gastroesophageal reflux disease)    History of Crohn's disease    Hyperlipidemia    Left ovarian cyst    s/p removal of 1 ovary ? which one removed per pt    Libido, decreased    Skin cancer    reports skin cancer removed from skin in 2016 or 2017   Thyroid nodule    repeat US resolved and Ct neck 05/2019 normal thyroid    Past Surgical History:  Procedure Laterality Date   ABDOMINAL HYSTERECTOMY     APPENDECTOMY  2004   BLADDER SURGERY     BREAST BIOPSY     x 2   COLONOSCOPY     COLONOSCOPY WITH PROPOFOL N/A 10/25/2020   Procedure: COLONOSCOPY WITH PROPOFOL;  Surgeon: Mauri Pole, MD;  Location: [facility];  Service: Endoscopy;  Laterality: N/A;    HEMOSTASIS CLIP PLACEMENT  10/25/2020   Procedure: HEMOSTASIS CLIP PLACEMENT;  Surgeon: Mauri Pole, MD;  Location: [facility];  Service: Endoscopy;;   OOPHORECTOMY Right    Unilateral Right (per MRI 04/17/2018)   POLYPECTOMY  10/25/2020   Procedure: POLYPECTOMY;  Surgeon: Mauri Pole, MD;  Location: [facility];  Service: Endoscopy;;   SUBMUCOSAL LIFTING INJECTION  10/25/2020   Procedure: SUBMUCOSAL LIFTING INJECTION;  Surgeon: Mauri Pole, MD;  Location: [facility];  Service: Endoscopy;;   WISDOM TOOTH EXTRACTION     Patient Active Problem List   Diagnosis Date Noted   Cervicalgia 07/01/2021   Low back pain 07/01/2021   Herpes infection 07/01/2021   Chronic pain of right hip 04/28/2021   DDD (degenerative disc disease), lumbar 01/06/2021   Facet arthritis of lumbar region 01/06/2021   Trochanteric bursitis of both hips 11/30/2020   Special screening for malignant neoplasms, colon    Polyp of cecum    Pain in both feet 04/12/2020   Burning sensation of feet 04/12/2020   Pain of both hip joints 04/12/2020   Onychomycosis 04/12/2020   Fibrocystic breast disease (  [facility]) 04/12/2020   Numbness in both legs 09/09/2019   Spinal stenosis in cervical region 05/09/2019   Facet arthropathy, cervical 05/09/2019   Chronic pain 11/27/2018   Obesity (BMI 30.0-34.9) 11/27/2018   Sprain and strain of hip and thigh 05/10/2018   Abnormal MRI, cervical spine 03/28/2018   Menopause 11/26/2017   Right upper quadrant pain 11/26/2017   Abnormal Pap smear of cervix 07/27/2017   Vasomotor flushing 07/19/2017   Depression 07/18/2017   Nausea 06/25/2017   Chronic pain of both hips 06/25/2017   OSA (obstructive sleep apnea) 04/18/2017   Annual physical exam 04/10/2017   Fatigue 12/16/2016   Chronic back pain 05/30/2016   Abdominal pain 03/12/2016   Anxiety and depression 12/28/2015   History of obstructive sleep apnea 12/15/2015   GERD (gastroesophageal reflux disease)  12/15/2015   Hyperlipidemia 12/15/2015   Pain medication agreement signed 03/25/2015   CAD in native artery 02/05/2015   Right supraspinatus tenosynovitis 08/17/2014   Crohn's disease involving terminal ileum ([place]) 03/30/2014   FH: colon polyps 03/30/2014   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 female who presents to outpatient physical therapy with a referral for medical diagnosis tear of right gluteus medius tendon, trochanteric bursitis of both hips, enthesopathy of hip region on both sides. This patient's chief complaints consist of right hip pain, stiffness, weakness, abnormal gait, difficulty with balance, and dysfunction s/p OPEN REPAIR R GLUTEUS MEDIUS gluteal repair 12/05/2021, leading to the following functional deficits: difficulty with usual activities and basic mobility including household and community ambulation, transfers, walking her dogs, ADLs, IADLs, going to the beach, stairs, sleeping, carrying, lifting, bending, doing activities that require weight bearing without loss of balance, walking the dogs. 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  Patient denies hx of stroke,  seizures, lung problems, diabetes, unexplained weight loss, unexplained changes in bowel or bladder problems, unexplained stumbling or dropping things, osteoporosis, and spinal surgery.  PRECAUTIONS: Other: can transition to weight bearing as tolerated.  See protocol in chart.  SUBJECTIVE: Patient arrives with [facility]. She states she think she did too much over the weekend while her husband was sick. She felt okay after last PT session and did her HEP for two days with good tolerance.   PAIN:  Are you having pain? Yes, 5/10 right lateral hip and low back.   OBJECTIVE  TODAY'S TREATMENT:  Therapeutic exercise: to centralize symptoms and improve ROM, strength, muscular endurance, and activity tolerance required for successful completion of functional activities.  - modified SLS with left foot on stool. VC to keep left side of pelvis lifted while balancing on R LE. 1x10 with 10-20 second hold. Improved ability to perform without cuing or feedback.  - ambulation with SPC 1x20 (improved  trendelenburg), 2x20 feet with no AD (improved trendelenburg).  - lateral stepping with no AD, 2x21 feet each direction. VC to improve L hip hike when stepping left. (Increased soreness at R lateral hip). - standing B hip ER with heels on rotation discs and YTB loop around forefeet, 3x20 with B UE support.   Circuit: - sidelying R hip abduction AROM, 3x10  - hooklying piliates single leg stretch without use of hands or curl up, 3x10 each side.   - sit <> stand with staggered stance to preferentially load R LE, from 18.5 inch surface,  3x10.  Pt required multimodal cuing for proper technique and to facilitate improved neuromuscular control, strength, range of motion, and functional ability resulting in improved performance and form.    PATIENT EDUCATION:  Education details:  Exercise purpose/form. Self management techniques. HEP including handout, POC, progress, need for mastery of strength exercises before  functional improvement/safety for ambulating with no AD or walking dogs is possible.  Reviewed cancelation/no-show policy with patient and confirmed patient has correct phone number for clinic; patient verbalized understanding (02/02/22). Person educated: Patient Education method: Explanation, demonstration, multimodal cuing.  Education comprehension: verbalized and demonstrated understanding and needs further education     HOME EXERCISE PROGRAM: Access Code: [email or web address] URL: [email or web address] Date: 03/08/2022 Prepared by: Rosita Kea  Exercises - Staggered Sit-to-Stand  - 1 x daily - 3 sets - 10 reps - Supine Bridge with Resistance Band  - 1 x daily - 1 sets - 20 reps - 5 seconds hold - Standing Hip Abduction with Resistance at Ankles and Unilateral Counter Support  - 1 x daily - 3 sets - 10 reps - 1-4 second hold hold - Standing Hip Flexion with Resistance Loop  - 1 x daily - 3 sets - 10 reps - 1-4 seconds hold - Standing Hip Extension with Resistance at Ankles and Counter Support  - 1 x daily - 3 sets - 10 reps - 1-4 seconds hold - Single Leg Balance in March Position  - 1 x daily - 2-3 sets - 8-10 reps - 10 seconds hold   ASSESSMENT:   CLINICAL IMPRESSION: Patient demonstrates significant improvement in R lateral hip stability during ambulation with improved gait pattern and decreased trendelenburg. Continues to have limited tolerance for R hip hip abduction movement. Patient would benefit from continued management of limiting condition by skilled physical therapist to address remaining impairments and functional limitations to work towards stated goals and return to PLOF or maximal functional independence.   From eval: Patient is a 56 y.o. female referred to outpatient physical therapy with a medical diagnosis of tear of right gluteus medius tendon, trochanteric bursitis of both hips, enthesopathy of hip region on both sides who presents with signs and symptoms  consistent with right hip pain, stiffness, weakness, dysfunction, imbalance, gait disturbance s/p OPEN REPAIR R GLUTEUS MEDIUS gluteal repair 12/05/2021 and . Patient presents with significant pain, ROM, posture, joint stiffness, tissue integrity, motor control, flexibility, gait, balance, muscle performance (strength/power/endurance) and activity tolerance impairments that are limiting ability to complete her usual activities and basic mobility including household and community ambulation, transfers, walking her dogs, ADLs, IADLs, going to the beach, stairs, sleeping, carrying, lifting, bending, doing activities that require weight bearing without loss of balance, walking the dogs without difficulty. Patient will benefit from skilled physical therapy intervention to address current body structure impairments and activity limitations to improve function and work towards goals set in current POC in order to return to prior level  of function or maximal functional improvement.    OBJECTIVE IMPAIRMENTS Abnormal gait, decreased activity tolerance, decreased balance, decreased coordination, decreased endurance, decreased knowledge of use of DME, decreased mobility, difficulty walking, decreased ROM, decreased strength, impaired perceived functional ability, impaired flexibility, improper body mechanics, postural dysfunction, and pain.    ACTIVITY LIMITATIONS carrying, lifting, bending, sitting, standing, squatting, sleeping, stairs, transfers, bed mobility, dressing, hygiene/grooming, locomotion level, and caring for others   PARTICIPATION LIMITATIONS: meal prep, cleaning, laundry, interpersonal relationship, driving, shopping, community activity, and   basic mobility including household and community ambulation, transfers, walking her dogs, ADLs, IADLs, going to the beach, stairs, sleeping, carrying, lifting, bending, doing activities that require weight bearing without loss of balance, walking the dogs.   PERSONAL  FACTORS Past/current experiences, Time since onset of injury/illness/exacerbation, Transportation, and 3+ comorbidities:   chronic back pain, chronic neck pain, hypermobile in several joints, GERD, fatigue, depression/anxiety, CAD (smallest artery in heart - no stent recommended - sees cardiologist no restrictions), skin cancer (cleared up a few years ago), chronic pain patient, on keto diet for about 1.5 years are also affecting patient's functional outcome.    REHAB POTENTIAL: Good   CLINICAL DECISION MAKING: Stable/uncomplicated   EVALUATION COMPLEXITY: Low     GOALS: Goals reviewed with patient? No   SHORT TERM GOALS: Target date: 02/09/2022   Patient will be independent with initial home exercise program for self-management of symptoms. Baseline: Initial HEP to be provided at visit 2 as appropriate (01/26/22); participates as able but struggles to get reps/sets in due to fatigue/soreness (03/06/2022); Goal status: partially met     LONG TERM GOALS: Target date: 04/20/2022   Patient will be independent with a long-term home exercise program for self-management of symptoms.  Baseline: Initial HEP to be provided at visit 2 as appropriate (01/26/22); participates as able but struggles to get reps/sets in due to fatigue/soreness (03/06/2022); Goal status: In-progress   2.  Patient will demonstrate improved FOTO to equal or greater than 54 by visit #16 to demonstrate improvement in overall condition and self-reported functional ability.  Baseline: 30 (01/26/22); 58 at visit #10 (03/06/2022);  Goal status: In-progress   3.  Patient will demonstrate R sided single leg stance of equal or greater than 30 seconds with no trendelenburg compensation to decrease fall risk and improve household and community ambulation.  Baseline: 0 seconds (01/26/22); 20 seconds with significant trendelenburg, unable to hold without trendelenburg (03/06/2022); Goal status: In-progress   4.  Patient will ambulate equal  or greater than 1000 feet with no AD during 6 min walk test to improve community ambulation.  Baseline: not tested (01/26/22); 617 feet with SPC. Altered gait pattern with trendelenburg pattern on R hip. Increased soreness (03/06/2022);  Goal status: In-progress   5.  Patient will complete community, work and/or recreational activities without limitation due to current condition.  Baseline: difficulty with usual activities including basic mobility including household and community ambulation, transfers, walking her dogs, ADLs, IADLs, going to the beach, stairs, sleeping, carrying, lifting, bending, doing activities that require weight bearing without loss of balance(01/26/22); reports she still cannot walk the dogs, is able to go up and down a few steps, continues to have difficulty with similar limitations with mild improvement (03/06/2022);  Goal status: In-progress   6.  Patient will demonstrate B hip abduction MMT equal or greater than 4+/5 with no increase in pain to improve activity tolerance for walking her dog.  Baseline: R = 3/5 with pain, left not tested  due to lack of tolerance for laying on R side (01/26/2022);  R = 4/5 with pain (patient also fatigues by 10 reps and is unable to do more than 2 sets of 10 reps of dynamic hip abduction), L = 4+/5 (03/06/2022);  Goal status: In-progress       PLAN: PT FREQUENCY: 1-2x/week   PT DURATION: 12 weeks   PLANNED INTERVENTIONS: Therapeutic exercises, Therapeutic activity, Neuromuscular re-education, Balance training, Gait training, Patient/Family education, Joint mobilization, Stair training, DME instructions, Aquatic Therapy, Dry Needling, Electrical stimulation, Spinal mobilization, Cryotherapy, Moist heat, Manual therapy, and Re-evaluation   PLAN FOR NEXT SESSION: update HEP as appropriate, progressive strengthening, ROM, gait training, and balance within protocol guidelines. Work towards ability to tolerate R SLS.    Everlean Alstrom. Graylon Good, PT,  DPT 03/13/22, 2:29 PM  [facility] [address] [place], [place] [address] P: [phone] I F: [phone]

## 2022-03-16 ENCOUNTER — Ambulatory Visit: Payer: Managed Care, Other (non HMO) | Admitting: Physical Therapy

## 2022-03-16 ENCOUNTER — Encounter: Payer: Self-pay | Admitting: Physical Therapy

## 2022-03-16 DIAGNOSIS — M6281 Muscle weakness (generalized): Secondary | ICD-10-CM

## 2022-03-16 DIAGNOSIS — R262 Difficulty in walking, not elsewhere classified: Secondary | ICD-10-CM

## 2022-03-16 DIAGNOSIS — M25551 Pain in right hip: Secondary | ICD-10-CM | POA: Diagnosis not present

## 2022-03-16 DIAGNOSIS — M25552 Pain in left hip: Secondary | ICD-10-CM

## 2022-03-16 DIAGNOSIS — R2681 Unsteadiness on feet: Secondary | ICD-10-CM

## 2022-03-16 NOTE — Therapy (Signed)
OUTPATIENT PHYSICAL THERAPY TREATMENT NOTE   Patient Name: Carolyn Esparza MRN: [id number] DOB:06/15/1966, 56 y.o., female Today's Date: 03/16/2022  PCP: [name], [name], MD REFERRING PROVIDER: Raelene Bott, PA  END OF SESSION:   PT End of Session - 03/16/22 1508     Visit Number 13    Number of Visits 24    Date for PT Re-Evaluation 04/20/22    Authorization Type [employer] reporting period from 03/06/2022    Authorization Time Period VL: max combined 60 PT/OT per year    Authorization - Visit Number 10    Authorization - Number of Visits 60    Progress Note Due on Visit 20    PT Start Time 1435    PT Stop Time 1513    PT Time Calculation (min) 38 min    Activity Tolerance Patient limited by fatigue;Patient limited by pain;Patient tolerated treatment well    Behavior During Therapy [facility] for tasks assessed/performed                Past Medical History:  Diagnosis Date   Anxiety    ASCUS with positive high risk HPV cervical    CAD (coronary artery disease)    Chicken pox    Chronic pain    neck, back, b/l hips    Chronic pain of right hip 12/15/6158   Complication of anesthesia    DDD (degenerative disc disease), lumbar 01/06/2021   Depression    Facet arthritis of lumbar region 01/06/2021   GERD (gastroesophageal reflux disease)    History of Crohn's disease    Hyperlipidemia    Left ovarian cyst    s/p removal of 1 ovary ? which one removed per pt    Libido, decreased    Skin cancer    reports skin cancer removed from skin in 2016 or 2017   Thyroid nodule    repeat US resolved and Ct neck 05/2019 normal thyroid    Past Surgical History:  Procedure Laterality Date   ABDOMINAL HYSTERECTOMY     APPENDECTOMY  2004   BLADDER SURGERY     BREAST BIOPSY     x 2   COLONOSCOPY     COLONOSCOPY WITH PROPOFOL N/A 10/25/2020   Procedure: COLONOSCOPY WITH PROPOFOL;  Surgeon: Mauri Pole, MD;  Location: [facility];  Service: Endoscopy;  Laterality: N/A;    HEMOSTASIS CLIP PLACEMENT  10/25/2020   Procedure: HEMOSTASIS CLIP PLACEMENT;  Surgeon: Mauri Pole, MD;  Location: [facility];  Service: Endoscopy;;   OOPHORECTOMY Right    Unilateral Right (per MRI 04/17/2018)   POLYPECTOMY  10/25/2020   Procedure: POLYPECTOMY;  Surgeon: Mauri Pole, MD;  Location: [facility];  Service: Endoscopy;;   SUBMUCOSAL LIFTING INJECTION  10/25/2020   Procedure: SUBMUCOSAL LIFTING INJECTION;  Surgeon: Mauri Pole, MD;  Location: [facility];  Service: Endoscopy;;   WISDOM TOOTH EXTRACTION     Patient Active Problem List   Diagnosis Date Noted   Cervicalgia 07/01/2021   Low back pain 07/01/2021   Herpes infection 07/01/2021   Chronic pain of right hip 04/28/2021   DDD (degenerative disc disease), lumbar 01/06/2021   Facet arthritis of lumbar region 01/06/2021   Trochanteric bursitis of both hips 11/30/2020   Special screening for malignant neoplasms, colon    Polyp of cecum    Pain in both feet 04/12/2020   Burning sensation of feet 04/12/2020   Pain of both hip joints 04/12/2020   Onychomycosis 04/12/2020   Fibrocystic breast  disease (FCBD) 04/12/2020   Numbness in both legs 09/09/2019   Spinal stenosis in cervical region 05/09/2019   Facet arthropathy, cervical 05/09/2019   Chronic pain 11/27/2018   Obesity (BMI 30.0-34.9) 11/27/2018   Sprain and strain of hip and thigh 05/10/2018   Abnormal MRI, cervical spine 03/28/2018   Menopause 11/26/2017   Right upper quadrant pain 11/26/2017   Abnormal Pap smear of cervix 07/27/2017   Vasomotor flushing 07/19/2017   Depression 07/18/2017   Nausea 06/25/2017   Chronic pain of both hips 06/25/2017   OSA (obstructive sleep apnea) 04/18/2017   Annual physical exam 04/10/2017   Fatigue 12/16/2016   Chronic back pain 05/30/2016   Abdominal pain 03/12/2016   Anxiety and depression 12/28/2015   History of obstructive sleep apnea 12/15/2015   GERD (gastroesophageal reflux disease)  12/15/2015   Hyperlipidemia 12/15/2015   Pain medication agreement signed 03/25/2015   CAD in native artery 02/05/2015   Right supraspinatus tenosynovitis 08/17/2014   Crohn's disease involving terminal ileum ([place]) 03/30/2014   FH: colon polyps 03/30/2014   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 female who presents to outpatient physical therapy with a referral for medical diagnosis tear of right gluteus medius tendon, trochanteric bursitis of both hips, enthesopathy of hip region on both sides. This patient's chief complaints consist of right hip pain, stiffness, weakness, abnormal gait, difficulty with balance, and dysfunction s/p OPEN REPAIR R GLUTEUS MEDIUS gluteal repair 12/05/2021, leading to the following functional deficits: difficulty with usual activities and basic mobility including household and community ambulation, transfers, walking her dogs, ADLs, IADLs, going to the beach, stairs, sleeping, carrying, lifting, bending, doing activities that require weight bearing without loss of balance, walking the dogs. 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  Patient denies hx of stroke,  seizures, lung problems, diabetes, unexplained weight loss, unexplained changes in bowel or bladder problems, unexplained stumbling or dropping things, osteoporosis, and spinal surgery.  PRECAUTIONS: Other: can transition to weight bearing as tolerated.  See protocol in chart.  SUBJECTIVE: Patient arrives with [facility]. She states she was hurting too much the last two days to do her HEP. She states she has been very sore at the right anterior, lateral, and posterior hip. She feels like she needs to dial back intensity. She has not been able to leave her walker at home as much a before. Her back has been "horrible" lately and thinks her RF procedure has been wearing off.   PAIN:  Are you having pain? Yes, 6/10 right lateral hip and low back.   OBJECTIVE  TODAY'S TREATMENT:  Therapeutic exercise: to centralize symptoms and improve ROM, strength, muscular endurance, and activity tolerance required for successful completion of functional activities.  - NuStep level 4  using bilateral upper and lower extremities. Seat/handle setting 6/8. For improved extremity mobility, muscular endurance, and activity tolerance; and to induce the analgesic effect of aerobic exercise, stimulate improved joint nutrition, and prepare body structures and systems for following interventions. x 6:34 minutes. Average SPM = 65.  - standing R hip abduction AROM, 3x10 - standing B hip ER with heels on rotation discs and YTB loop around forefeet, 3x10 with B UE support.  - standing R hip extension on hip machine, 3x10 starting from 90 degrees, 40#.  - education about SIJ - hooklying piliates single leg stretch without use of hands or curl up, 3x10 each side.  - sit <> stand from 18.5 inch surface,  3x10.  Pt required multimodal cuing for proper technique and to facilitate improved neuromuscular control, strength, range of motion, and functional ability resulting in improved performance and form.    PATIENT EDUCATION:  Education  details:  Exercise purpose/form. Self management techniques. HEP including handout, POC, progress, need for mastery of strength exercises before functional improvement/safety for ambulating with no AD or walking dogs is possible.  Reviewed cancelation/no-show policy with patient and confirmed patient has correct phone number for clinic; patient verbalized understanding (02/02/22). Person educated: Patient Education method: Explanation, demonstration, multimodal cuing.  Education comprehension: verbalized and demonstrated understanding and needs further education     HOME EXERCISE PROGRAM: Access Code: [email or web address] URL: [email or web address] Date: 03/08/2022 Prepared by: Rosita Kea  Exercises - Staggered Sit-to-Stand  - 1 x daily - 3 sets - 10 reps - Supine Bridge with Resistance Band  - 1 x daily - 1 sets - 20 reps - 5 seconds hold - Standing Hip Abduction with Resistance at Ankles and Unilateral Counter Support  - 1 x daily - 3 sets - 10 reps - 1-4 second hold hold - Standing Hip Flexion with Resistance Loop  - 1 x daily - 3 sets - 10 reps - 1-4 seconds hold - Standing Hip Extension with Resistance at Ankles and Counter Support  - 1 x daily - 3 sets - 10 reps - 1-4 seconds hold - Single Leg Balance in March Position  - 1 x daily - 2-3 sets - 8-10 reps - 10 seconds hold   ASSESSMENT:   CLINICAL IMPRESSION: Patient arrives with increased pain and required regression of exercises to prevent further irritation. Patient's gait also showing more trendelenburg since she has experienced more pain. Patient would benefit from continued management of limiting condition by skilled physical therapist to address remaining impairments and functional limitations to work towards stated goals and return to PLOF or maximal functional independence.   From eval: Patient is a 56 y.o. female referred to outpatient physical therapy with a medical diagnosis of tear of right gluteus medius tendon,  trochanteric bursitis of both hips, enthesopathy of hip region on both sides who presents with signs and symptoms consistent with right hip pain, stiffness, weakness, dysfunction, imbalance, gait disturbance s/p OPEN REPAIR R GLUTEUS MEDIUS gluteal repair 12/05/2021 and . Patient presents with significant pain, ROM, posture, joint stiffness, tissue integrity, motor control, flexibility, gait, balance, muscle performance (strength/power/endurance) and activity tolerance impairments that are limiting ability to complete her usual activities and basic mobility including household and community ambulation, transfers, walking her dogs, ADLs, IADLs, going to the beach, stairs, sleeping, carrying, lifting, bending, doing activities that require weight bearing without loss of balance, walking the dogs without difficulty. Patient will benefit from skilled physical therapy intervention to address current body structure impairments and activity limitations  to improve function and work towards goals set in current POC in order to return to prior level of function or maximal functional improvement.    OBJECTIVE IMPAIRMENTS Abnormal gait, decreased activity tolerance, decreased balance, decreased coordination, decreased endurance, decreased knowledge of use of DME, decreased mobility, difficulty walking, decreased ROM, decreased strength, impaired perceived functional ability, impaired flexibility, improper body mechanics, postural dysfunction, and pain.    ACTIVITY LIMITATIONS carrying, lifting, bending, sitting, standing, squatting, sleeping, stairs, transfers, bed mobility, dressing, hygiene/grooming, locomotion level, and caring for others   PARTICIPATION LIMITATIONS: meal prep, cleaning, laundry, interpersonal relationship, driving, shopping, community activity, and   basic mobility including household and community ambulation, transfers, walking her dogs, ADLs, IADLs, going to the beach, stairs, sleeping, carrying,  lifting, bending, doing activities that require weight bearing without loss of balance, walking the dogs.   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    REHAB POTENTIAL: Good   CLINICAL DECISION MAKING: Stable/uncomplicated   EVALUATION COMPLEXITY: Low     GOALS: Goals reviewed with patient? No   SHORT TERM GOALS: Target date: 02/09/2022   Patient will be independent with initial home exercise program for self-management of symptoms. Baseline: Initial HEP to be provided at visit 2 as appropriate (01/26/22); participates as able but struggles to get reps/sets in due to fatigue/soreness (03/06/2022); Goal status: partially met     LONG TERM GOALS: Target date: 04/20/2022   Patient will be independent with a long-term home exercise program for self-management of symptoms.  Baseline: Initial HEP to be provided at visit 2 as appropriate (01/26/22); participates as able but struggles to get reps/sets in due to fatigue/soreness (03/06/2022); Goal status: In-progress   2.  Patient will demonstrate improved FOTO to equal or greater than 54 by visit #16 to demonstrate improvement in overall condition and self-reported functional ability.  Baseline: 30 (01/26/22); 58 at visit #10 (03/06/2022);  Goal status: In-progress   3.  Patient will demonstrate R sided single leg stance of equal or greater than 30 seconds with no trendelenburg compensation to decrease fall risk and improve household and community ambulation.  Baseline: 0 seconds (01/26/22); 20 seconds with significant  trendelenburg, unable to hold without trendelenburg (03/06/2022); Goal status: In-progress   4.  Patient will ambulate equal or greater than 1000 feet with no AD during 6 min walk test to improve community ambulation.  Baseline: not tested (01/26/22); 617 feet with SPC. Altered gait pattern with trendelenburg pattern on R hip. Increased soreness (03/06/2022);  Goal status: In-progress   5.  Patient will complete community, work and/or recreational activities without limitation due to current condition.  Baseline: difficulty with usual activities including basic mobility including household and community ambulation, transfers, walking her dogs, ADLs, IADLs, going to the beach, stairs, sleeping, carrying, lifting, bending, doing activities that require weight bearing without loss of balance(01/26/22); reports she still cannot walk the dogs, is able to go up and down a few steps, continues to have difficulty with similar limitations with mild improvement (03/06/2022);  Goal status: In-progress   6.  Patient will demonstrate B hip abduction MMT equal or greater than 4+/5 with no increase in pain  to improve activity tolerance for walking her dog.  Baseline: R = 3/5 with pain, left not tested due to lack of tolerance for laying on R side (01/26/2022);  R = 4/5 with pain (patient also fatigues by 10 reps and is unable to do more than 2 sets of 10 reps of dynamic hip abduction), L = 4+/5 (03/06/2022);  Goal status: In-progress       PLAN: PT FREQUENCY: 1-2x/week   PT DURATION: 12 weeks   PLANNED INTERVENTIONS: Therapeutic exercises, Therapeutic activity, Neuromuscular re-education, Balance training, Gait training, Patient/Family education, Joint mobilization, Stair training, DME instructions, Aquatic Therapy, Dry Needling, Electrical stimulation, Spinal mobilization, Cryotherapy, Moist heat, Manual therapy, and Re-evaluation   PLAN FOR NEXT SESSION: update HEP as appropriate, progressive strengthening, ROM,  gait training, and balance within protocol guidelines. Work towards ability to tolerate R SLS.    Everlean Alstrom. Graylon Good, PT, DPT 03/16/22, 3:26 PM  [facility] [address] [place], [place] [address] P: [phone] I F: [phone]

## 2022-03-20 ENCOUNTER — Encounter: Payer: Self-pay | Admitting: Physical Therapy

## 2022-03-20 ENCOUNTER — Ambulatory Visit: Payer: Managed Care, Other (non HMO) | Admitting: Physical Therapy

## 2022-03-20 DIAGNOSIS — M25551 Pain in right hip: Secondary | ICD-10-CM

## 2022-03-20 DIAGNOSIS — R262 Difficulty in walking, not elsewhere classified: Secondary | ICD-10-CM

## 2022-03-20 DIAGNOSIS — M6281 Muscle weakness (generalized): Secondary | ICD-10-CM

## 2022-03-20 DIAGNOSIS — R2681 Unsteadiness on feet: Secondary | ICD-10-CM

## 2022-03-20 DIAGNOSIS — M25552 Pain in left hip: Secondary | ICD-10-CM

## 2022-03-20 NOTE — Therapy (Signed)
OUTPATIENT PHYSICAL THERAPY TREATMENT NOTE   Patient Name: Carolyn Esparza MRN: [id number] DOB:[birth year], 56 y.o., female Today's Date: 03/20/2022  PCP: [name], [name], MD REFERRING PROVIDER: Raelene Bott, PA  END OF SESSION:   PT End of Session - 03/20/22 1442     Visit Number 14    Number of Visits 24    Date for PT Re-Evaluation 04/20/22    Authorization Type [employer] reporting period from 03/06/2022    Authorization Time Period VL: max combined 60 PT/OT per year    Authorization - Visit Number 14    Authorization - Number of Visits 60    Progress Note Due on Visit 20    PT Start Time 6629    PT Stop Time 1511    PT Time Calculation (min) 38 min    Activity Tolerance Patient limited by fatigue;Patient limited by pain;Patient tolerated treatment well    Behavior During Therapy [facility] for tasks assessed/performed                 Past Medical History:  Diagnosis Date   Anxiety    ASCUS with positive high risk HPV cervical    CAD (coronary artery disease)    Chicken pox    Chronic pain    neck, back, b/l hips    Chronic pain of right hip 4/76/5465   Complication of anesthesia    DDD (degenerative disc disease), lumbar 01/06/2021   Depression    Facet arthritis of lumbar region 01/06/2021   GERD (gastroesophageal reflux disease)    History of Crohn's disease    Hyperlipidemia    Left ovarian cyst    s/p removal of 1 ovary ? which one removed per pt    Libido, decreased    Skin cancer    reports skin cancer removed from skin in 2016 or 2017   Thyroid nodule    repeat US resolved and Ct neck 05/2019 normal thyroid    Past Surgical History:  Procedure Laterality Date   ABDOMINAL HYSTERECTOMY     APPENDECTOMY  2004   BLADDER SURGERY     BREAST BIOPSY     x 2   COLONOSCOPY     COLONOSCOPY WITH PROPOFOL N/A 10/25/2020   Procedure: COLONOSCOPY WITH PROPOFOL;  Surgeon: Mauri Pole, MD;  Location: [facility];  Service: Endoscopy;  Laterality: N/A;    HEMOSTASIS CLIP PLACEMENT  10/25/2020   Procedure: HEMOSTASIS CLIP PLACEMENT;  Surgeon: Mauri Pole, MD;  Location: [facility];  Service: Endoscopy;;   OOPHORECTOMY Right    Unilateral Right (per MRI 04/17/2018)   POLYPECTOMY  10/25/2020   Procedure: POLYPECTOMY;  Surgeon: Mauri Pole, MD;  Location: [facility];  Service: Endoscopy;;   SUBMUCOSAL LIFTING INJECTION  10/25/2020   Procedure: SUBMUCOSAL LIFTING INJECTION;  Surgeon: Mauri Pole, MD;  Location: [facility];  Service: Endoscopy;;   WISDOM TOOTH EXTRACTION     Patient Active Problem List   Diagnosis Date Noted   Cervicalgia 07/01/2021   Low back pain 07/01/2021   Herpes infection 07/01/2021   Chronic pain of right hip 04/28/2021   DDD (degenerative disc disease), lumbar 01/06/2021   Facet arthritis of lumbar region 01/06/2021   Trochanteric bursitis of both hips 11/30/2020   Special screening for malignant neoplasms, colon    Polyp of cecum    Pain in both feet 04/12/2020   Burning sensation of feet 04/12/2020   Pain of both hip joints 04/12/2020   Onychomycosis 04/12/2020   Fibrocystic  breast disease (FCBD) 04/12/2020   Numbness in both legs 09/09/2019   Spinal stenosis in cervical region 05/09/2019   Facet arthropathy, cervical 05/09/2019   Chronic pain 11/27/2018   Obesity (BMI 30.0-34.9) 11/27/2018   Sprain and strain of hip and thigh 05/10/2018   Abnormal MRI, cervical spine 03/28/2018   Menopause 11/26/2017   Right upper quadrant pain 11/26/2017   Abnormal Pap smear of cervix 07/27/2017   Vasomotor flushing 07/19/2017   Depression 07/18/2017   Nausea 06/25/2017   Chronic pain of both hips 06/25/2017   OSA (obstructive sleep apnea) 04/18/2017   Annual physical exam 04/10/2017   Fatigue 12/16/2016   Chronic back pain 05/30/2016   Abdominal pain 03/12/2016   Anxiety and depression 12/28/2015   History of obstructive sleep apnea 12/15/2015   GERD (gastroesophageal reflux disease)  12/15/2015   Hyperlipidemia 12/15/2015   Pain medication agreement signed 03/25/2015   CAD in native artery 02/05/2015   Right supraspinatus tenosynovitis 08/17/2014   Crohn's disease involving terminal ileum ([place]) 03/30/2014   FH: colon polyps 03/30/2014   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 female who presents to outpatient physical therapy with a referral for medical diagnosis tear of right gluteus medius tendon, trochanteric bursitis of both hips, enthesopathy of hip region on both sides. This patient's chief complaints consist of right hip pain, stiffness, weakness, abnormal gait, difficulty with balance, and dysfunction s/p OPEN REPAIR R GLUTEUS MEDIUS gluteal repair 12/05/2021, leading to the following functional deficits: difficulty with usual activities and basic mobility including household and community ambulation, transfers, walking her dogs, ADLs, IADLs, going to the beach, stairs, sleeping, carrying, lifting, bending, doing activities that require weight bearing without loss of balance, walking the dogs. 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  Patient denies hx of stroke,  seizures, lung problems, diabetes, unexplained weight loss, unexplained changes in bowel or bladder problems, unexplained stumbling or dropping things, osteoporosis, and spinal surgery.  PRECAUTIONS: Other: can transition to weight bearing as tolerated.  See protocol in chart.  SUBJECTIVE: Patient arrives with [facility]. She states her right hip is feeling better. She took Friday off HEP, she avoided using the band Sat and Sun but she did hip extension on Saturday that bothered her back but it was better without that on Sunday.   PAIN:  Are you having pain? Yes, 3/10 right lateral hip and low back.   OBJECTIVE  TODAY'S TREATMENT:  Therapeutic exercise: to centralize symptoms and improve ROM, strength, muscular endurance, and activity tolerance required for successful completion of functional activities.  - NuStep level 4 using bilateral upper and lower extremities. Seat/handle setting 6/8. For improved extremity mobility, muscular endurance, and activity tolerance; and to induce the analgesic effect of aerobic exercise, stimulate  improved joint nutrition, and prepare body structures and systems for following interventions. x 6:34 minutes. Average SPM = 65.  - standing modified R SLS with left foot on 9 inch step, lifting left hip to stay out of trendelenburg, 2x5 at 10 second holds.  - standing B hip ER with heels on rotation discs and YTB loop around forefeet, 3x20 with B UE support.  - standing R hip extension on hip machine, 3x10 starting from 90 degrees, 55/40/40#.  - OMEGA knee extension, 1x3 at 15# (too hard), 2x10/7 at 10#. (Rated hard at rep 7 first set).  - standing narrow stance with  - static balance, narrow stance on airex, eyes closed, head turns, 3x1 min. SBA.  - step up to BOSU leading with left foot, B UE support on TM bar, SBA . - static balance on flat side of BOSU, 1x1 min, SBA. 1 rep.  - mini LE pumps on flat side of BOSU, 2x1 min, SBA and UE support on TM bar as needed.  - step  down from BOSU leading with R LE, BUE support, SBA. 1 rep  Pt required multimodal cuing for proper technique and to facilitate improved neuromuscular control, strength, range of motion, and functional ability resulting in improved performance and form.    PATIENT EDUCATION:  Education details:  Exercise purpose/form. Self management techniques. HEP including handout, POC, progress, need for mastery of strength exercises before functional improvement/safety for ambulating with no AD or walking dogs is possible.  Reviewed cancelation/no-show policy with patient and confirmed patient has correct phone number for clinic; patient verbalized understanding (02/02/22). Person educated: Patient Education method: Explanation, demonstration, multimodal cuing.  Education comprehension: verbalized and demonstrated understanding and needs further education     HOME EXERCISE PROGRAM: Access Code: [email or web address] URL: [email or web address] Date: 03/08/2022 Prepared by: Rosita Kea  Exercises - Staggered Sit-to-Stand  - 1 x daily - 3 sets - 10 reps - Supine Bridge with Resistance Band  - 1 x daily - 1 sets - 20 reps - 5 seconds hold - Standing Hip Abduction with Resistance at Ankles and Unilateral Counter Support  - 1 x daily - 3 sets - 10 reps - 1-4 second hold hold - Standing Hip Flexion with Resistance Loop  - 1 x daily - 3 sets - 10 reps - 1-4 seconds hold - Standing Hip Extension with Resistance at Ankles and Counter Support  - 1 x daily - 3 sets - 10 reps - 1-4 seconds hold - Single Leg Balance in March Position  - 1 x daily - 2-3 sets - 8-10 reps - 10 seconds hold   ASSESSMENT:   CLINICAL IMPRESSION: Patient arrives with better controlled pain with continued trendelenburg gait with limited ability to stand on R LE SLS without pain and trendelenburg stance. Patient was able to return to some modified SLS on R this session. Other balance, proprioception, and generalized LE strengthening  included this session to support functional activity tolerance without overloading R lateral hip muscles. Patient reported no worsening of pain by end of session but found exercises challenging and said she could do no more reps of knee extension or hip extension at certain weights requiring decreased load, reps, or sets. Patient would benefit from continued management of limiting condition by skilled physical therapist to address remaining impairments and functional limitations to work towards stated goals and return to PLOF or maximal functional independence.   From eval: Patient is a 56 y.o. female referred to outpatient physical therapy with a  medical diagnosis of tear of right gluteus medius tendon, trochanteric bursitis of both hips, enthesopathy of hip region on both sides who presents with signs and symptoms consistent with right hip pain, stiffness, weakness, dysfunction, imbalance, gait disturbance s/p OPEN REPAIR R GLUTEUS MEDIUS gluteal repair 12/05/2021 and . Patient presents with significant pain, ROM, posture, joint stiffness, tissue integrity, motor control, flexibility, gait, balance, muscle performance (strength/power/endurance) and activity tolerance impairments that are limiting ability to complete her usual activities and basic mobility including household and community ambulation, transfers, walking her dogs, ADLs, IADLs, going to the beach, stairs, sleeping, carrying, lifting, bending, doing activities that require weight bearing without loss of balance, walking the dogs without difficulty. Patient will benefit from skilled physical therapy intervention to address current body structure impairments and activity limitations to improve function and work towards goals set in current POC in order to return to prior level of function or maximal functional improvement.    OBJECTIVE IMPAIRMENTS Abnormal gait, decreased activity tolerance, decreased balance, decreased coordination, decreased  endurance, decreased knowledge of use of DME, decreased mobility, difficulty walking, decreased ROM, decreased strength, impaired perceived functional ability, impaired flexibility, improper body mechanics, postural dysfunction, and pain.    ACTIVITY LIMITATIONS carrying, lifting, bending, sitting, standing, squatting, sleeping, stairs, transfers, bed mobility, dressing, hygiene/grooming, locomotion level, and caring for others   PARTICIPATION LIMITATIONS: meal prep, cleaning, laundry, interpersonal relationship, driving, shopping, community activity, and   basic mobility including household and community ambulation, transfers, walking her dogs, ADLs, IADLs, going to the beach, stairs, sleeping, carrying, lifting, bending, doing activities that require weight bearing without loss of balance, walking the dogs.   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    REHAB POTENTIAL: Good   CLINICAL DECISION MAKING: Stable/uncomplicated   EVALUATION COMPLEXITY: Low     GOALS: Goals reviewed with patient? No   SHORT TERM GOALS: Target date: 02/09/2022   Patient will be independent with initial home exercise program for self-management of symptoms. Baseline: Initial HEP to be provided at visit 2 as appropriate (01/26/22); participates as able but struggles to get reps/sets in due to fatigue/soreness (03/06/2022); Goal status: partially met     LONG TERM GOALS: Target date: 04/20/2022   Patient will be independent with a long-term home exercise program for self-management of symptoms.   Baseline: Initial HEP to be provided at visit 2 as appropriate (01/26/22); participates as able but struggles to get reps/sets in due to fatigue/soreness (03/06/2022); Goal status: In-progress   2.  Patient will demonstrate improved FOTO to equal or greater than 54 by visit #16 to demonstrate improvement in overall condition and self-reported functional ability.  Baseline: 30 (01/26/22); 58 at visit #10 (03/06/2022);  Goal status: In-progress   3.  Patient will demonstrate R sided single leg stance of equal or greater than 30 seconds with no trendelenburg compensation to decrease fall risk and improve household and community ambulation.  Baseline: 0 seconds (01/26/22); 20 seconds with significant trendelenburg, unable to hold without trendelenburg (03/06/2022); Goal status: In-progress   4.  Patient will ambulate equal or greater than 1000 feet with no AD during 6 min walk test to improve community  ambulation.  Baseline: not tested (01/26/22); 617 feet with SPC. Altered gait pattern with trendelenburg pattern on R hip. Increased soreness (03/06/2022);  Goal status: In-progress   5.  Patient will complete community, work and/or recreational activities without limitation due to current condition.  Baseline: difficulty with usual activities including basic mobility including household and community ambulation, transfers, walking her dogs, ADLs, IADLs, going to the beach, stairs, sleeping, carrying, lifting, bending, doing activities that require weight bearing without loss of balance(01/26/22); reports she still cannot walk the dogs, is able to go up and down a few steps, continues to have difficulty with similar limitations with mild improvement (03/06/2022);  Goal status: In-progress   6.  Patient will demonstrate B hip abduction MMT equal or greater than 4+/5 with no increase in pain to improve activity tolerance for walking her dog.  Baseline: R = 3/5 with pain, left not tested due to lack of tolerance  for laying on R side (01/26/2022);  R = 4/5 with pain (patient also fatigues by 10 reps and is unable to do more than 2 sets of 10 reps of dynamic hip abduction), L = 4+/5 (03/06/2022);  Goal status: In-progress       PLAN: PT FREQUENCY: 1-2x/week   PT DURATION: 12 weeks   PLANNED INTERVENTIONS: Therapeutic exercises, Therapeutic activity, Neuromuscular re-education, Balance training, Gait training, Patient/Family education, Joint mobilization, Stair training, DME instructions, Aquatic Therapy, Dry Needling, Electrical stimulation, Spinal mobilization, Cryotherapy, Moist heat, Manual therapy, and Re-evaluation   PLAN FOR NEXT SESSION: update HEP as appropriate, progressive strengthening, ROM, gait training, and balance within protocol guidelines. Work towards ability to tolerate R SLS.    Everlean Alstrom. Graylon Good, PT, DPT 03/20/22, 3:13 PM  [facility] [address] [place], [place] [address] P: [phone] I F: [phone]

## 2022-03-23 ENCOUNTER — Encounter: Payer: Self-pay | Admitting: Physical Therapy

## 2022-03-23 ENCOUNTER — Ambulatory Visit: Payer: Managed Care, Other (non HMO) | Admitting: Physical Therapy

## 2022-03-23 ENCOUNTER — Encounter: Payer: Managed Care, Other (non HMO) | Admitting: Physical Therapy

## 2022-03-23 DIAGNOSIS — M25551 Pain in right hip: Secondary | ICD-10-CM | POA: Diagnosis not present

## 2022-03-23 DIAGNOSIS — R2681 Unsteadiness on feet: Secondary | ICD-10-CM

## 2022-03-23 DIAGNOSIS — M6281 Muscle weakness (generalized): Secondary | ICD-10-CM

## 2022-03-23 DIAGNOSIS — M25552 Pain in left hip: Secondary | ICD-10-CM

## 2022-03-23 DIAGNOSIS — R262 Difficulty in walking, not elsewhere classified: Secondary | ICD-10-CM

## 2022-03-23 NOTE — Therapy (Signed)
OUTPATIENT PHYSICAL THERAPY TREATMENT NOTE   Patient Name: Carolyn Esparza MRN: [id number] DOB:06-04-1966, 56 y.o., female Today's Date: 03/23/2022  PCP: [name], Pasty Spillers, MD REFERRING PROVIDER: Kendrick Fries, PA  END OF SESSION:   PT End of Session - 03/23/22 1716     Visit Number 15    Number of Visits 24    Date for PT Re-Evaluation 04/20/22    Authorization Type [employer] reporting period from 03/06/2022    Authorization Time Period VL: max combined 60 PT/OT per year    Authorization - Visit Number 15    Authorization - Number of Visits 60    Progress Note Due on Visit 20    PT Start Time 1603    PT Stop Time 1641    PT Time Calculation (min) 38 min    Activity Tolerance Patient limited by fatigue;Patient limited by pain;Patient tolerated treatment well    Behavior During Therapy [facility] for tasks assessed/performed                  Past Medical History:  Diagnosis Date   Anxiety    ASCUS with positive high risk HPV cervical    CAD (coronary artery disease)    Chicken pox    Chronic pain    neck, back, b/l hips    Chronic pain of right hip 04/28/2021   Complication of anesthesia    DDD (degenerative disc disease), lumbar 01/06/2021   Depression    Facet arthritis of lumbar region 01/06/2021   GERD (gastroesophageal reflux disease)    History of Crohn's disease    Hyperlipidemia    Left ovarian cyst    s/p removal of 1 ovary ? which one removed per pt    Libido, decreased    Skin cancer    reports skin cancer removed from skin in 2016 or 2017   Thyroid nodule    repeat US resolved and Ct neck 05/2019 normal thyroid    Past Surgical History:  Procedure Laterality Date   ABDOMINAL HYSTERECTOMY     APPENDECTOMY  2004   BLADDER SURGERY     BREAST BIOPSY     x 2   COLONOSCOPY     COLONOSCOPY WITH PROPOFOL N/A 10/25/2020   Procedure: COLONOSCOPY WITH PROPOFOL;  Surgeon: Napoleon Form, MD;  Location: [facility];  Service: Endoscopy;  Laterality:  N/A;   HEMOSTASIS CLIP PLACEMENT  10/25/2020   Procedure: HEMOSTASIS CLIP PLACEMENT;  Surgeon: Napoleon Form, MD;  Location: [facility];  Service: Endoscopy;;   OOPHORECTOMY Right    Unilateral Right (per MRI 04/17/2018)   POLYPECTOMY  10/25/2020   Procedure: POLYPECTOMY;  Surgeon: Napoleon Form, MD;  Location: [facility];  Service: Endoscopy;;   SUBMUCOSAL LIFTING INJECTION  10/25/2020   Procedure: SUBMUCOSAL LIFTING INJECTION;  Surgeon: Napoleon Form, MD;  Location: [facility];  Service: Endoscopy;;   WISDOM TOOTH EXTRACTION     Patient Active Problem List   Diagnosis Date Noted   Cervicalgia 07/01/2021   Low back pain 07/01/2021   Herpes infection 07/01/2021   Chronic pain of right hip 04/28/2021   DDD (degenerative disc disease), lumbar 01/06/2021   Facet arthritis of lumbar region 01/06/2021   Trochanteric bursitis of both hips 11/30/2020   Special screening for malignant neoplasms, colon    Polyp of cecum    Pain in both feet 04/12/2020   Burning sensation of feet 04/12/2020   Pain of both hip joints 04/12/2020   Onychomycosis 04/12/2020  Fibrocystic breast disease (FCBD) 04/12/2020   Numbness in both legs 09/09/2019   Spinal stenosis in cervical region 05/09/2019   Facet arthropathy, cervical 05/09/2019   Chronic pain 11/27/2018   Obesity (BMI 30.0-34.9) 11/27/2018   Sprain and strain of hip and thigh 05/10/2018   Abnormal MRI, cervical spine 03/28/2018   Menopause 11/26/2017   Right upper quadrant pain 11/26/2017   Abnormal Pap smear of cervix 07/27/2017   Vasomotor flushing 07/19/2017   Depression 07/18/2017   Nausea 06/25/2017   Chronic pain of both hips 06/25/2017   OSA (obstructive sleep apnea) 04/18/2017   Annual physical exam 04/10/2017   Fatigue 12/16/2016   Chronic back pain 05/30/2016   Abdominal pain 03/12/2016   Anxiety and depression 12/28/2015   History of obstructive sleep apnea 12/15/2015   GERD (gastroesophageal reflux  disease) 12/15/2015   Hyperlipidemia 12/15/2015   Pain medication agreement signed 03/25/2015   CAD in native artery 02/05/2015   Right supraspinatus tenosynovitis 08/17/2014   Crohn's disease involving terminal ileum (HCC) 03/30/2014   FH: colon polyps 03/30/2014   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 female who presents to outpatient physical therapy with a referral for medical diagnosis tear of right gluteus medius tendon, trochanteric bursitis of both hips, enthesopathy of hip region on both sides. This patient's chief complaints consist of right hip pain, stiffness, weakness, abnormal gait, difficulty with balance, and dysfunction s/p OPEN REPAIR R GLUTEUS MEDIUS gluteal repair 12/05/2021, leading to the following functional deficits: difficulty with usual activities and basic mobility including household and community ambulation, transfers, walking her dogs, ADLs, IADLs, going to the beach, stairs, sleeping, carrying, lifting, bending, doing activities that require weight bearing without loss of balance, walking the dogs. 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  Patient denies hx of  stroke, seizures, lung problems, diabetes, unexplained weight loss, unexplained changes in bowel or bladder problems, unexplained stumbling or dropping things, osteoporosis, and spinal surgery.  PRECAUTIONS: Other: can transition to weight bearing as tolerated.  See protocol in chart.  SUBJECTIVE: Patient arrives with [facility]. She states she saw the referring doctor who said the repair is holding. He expected her to need 3-6 months before she could give up the cane. She explained to him that she was having trouble tolerating exercises for the right hip and he seemed to feel it was due to weak muscles.  She states she had pain after last PT session that stopped her from doing her exercise and yesterday she didn't do her exercise. Her R hip is hurting at 5/10 and her back is worse than her hip 6/10.   PAIN:  Are you having pain? Yes, 5/10 right lateral hip and 6/10 in low back.    OBJECTIVE  TODAY'S TREATMENT:  Therapeutic exercise: to centralize symptoms and improve ROM, strength, muscular endurance, and activity tolerance required for successful completion of functional activities.  - NuStep level 4 using bilateral upper and lower extremities. Seat/handle setting 6/8. For improved extremity mobility, muscular endurance, and activity tolerance; and to induce the analgesic effect of aerobic exercise, stimulate improved joint nutrition, and prepare body structures and systems for following interventions. x 5:21 minutes. Average SPM = 63.   Circuit:  - standing AROM R hip abduction, 3x10 with 5 second holds.  - standing AROM R hip flexion knee extended, 3x10 with 5 second hold.   - sit <> stand from 18 inch chair with no UE support, 1x10 with feet even, 2x10 with 4 inch staggered stance (R foot back).  - standing modified R SLS with left foot on 9 inch step, lifting left hip to stay out of trendelenburg, 1x10 at 10 second holds. Cuing to increase weight bearing on L LE to prevent increased pain at right  hip.  - hooklying bridge with isometric hip abduction against YTB looped around knees, 2x10 with 5 second hold.  - Education on HEP including handout   Pt required multimodal cuing for proper technique and to facilitate improved neuromuscular control, strength, range of motion, and functional ability resulting in improved performance and form.    PATIENT EDUCATION:  Education details:  Exercise purpose/form. Self management techniques. HEP including handout,  Reviewed cancelation/no-show policy with patient and confirmed patient has correct phone number for clinic; patient verbalized understanding (02/02/22). Person educated: Patient Education method: Explanation, demonstration, multimodal cuing.  Education comprehension: verbalized and demonstrated understanding and needs further education     HOME EXERCISE PROGRAM: Access Code: [email or web address] URL: [email or web address] Date: 03/23/2022 Prepared by: Rosita Kea  Exercises - Staggered Sit-to-Stand  - 1 x daily - 3 sets - 10 reps - Supine Bridge with Resistance Band  - 1 x daily - 1 sets - 20 reps - 5 seconds hold - Standing Hip Abduction with Resistance at Ankles and Unilateral Counter Support  - 1 x daily - 3 sets - 10 reps - 5 second hold hold - Standing Hip Flexion with Resistance Loop  - 1 x daily - 3 sets - 10 reps - 5 seconds hold - Single Leg Balance in March Position  - 1 x daily - 2-3 sets - 8-10 reps - 10 seconds hold   ASSESSMENT:   CLINICAL IMPRESSION: Patient arrives feeling discouraged about her doctor's expectation that she would be on the [facility] for 3-6 more months. Patient continues to report pattern of pain after PT visits that prevent her from doing her HEP. Today's session focused on exercises that she previously tolerated well and that were performed today in the clinic with no increase in pain during or after exercise. HEP regressed/modified slightly to eliminate any further pain provocation. Patient is still  limited in pain and weakness at right hip that causes in tolerance for weight bearing and trendelenburg in single leg stance. Patient would benefit from continued management of limiting condition by skilled physical therapist to address remaining impairments and functional limitations to work towards stated goals and return to PLOF or maximal functional independence.   From eval: Patient is a 56 y.o. female referred to outpatient physical therapy with a medical diagnosis of tear of right gluteus medius tendon, trochanteric bursitis of both hips, enthesopathy of hip region on both sides who presents with signs and symptoms consistent with right hip pain, stiffness, weakness, dysfunction, imbalance,  gait disturbance s/p OPEN REPAIR R GLUTEUS MEDIUS gluteal repair 12/05/2021 and . Patient presents with significant pain, ROM, posture, joint stiffness, tissue integrity, motor control, flexibility, gait, balance, muscle performance (strength/power/endurance) and activity tolerance impairments that are limiting ability to complete her usual activities and basic mobility including household and community ambulation, transfers, walking her dogs, ADLs, IADLs, going to the beach, stairs, sleeping, carrying, lifting, bending, doing activities that require weight bearing without loss of balance, walking the dogs without difficulty. Patient will benefit from skilled physical therapy intervention to address current body structure impairments and activity limitations to improve function and work towards goals set in current POC in order to return to prior level of function or maximal functional improvement.    OBJECTIVE IMPAIRMENTS Abnormal gait, decreased activity tolerance, decreased balance, decreased coordination, decreased endurance, decreased knowledge of use of DME, decreased mobility, difficulty walking, decreased ROM, decreased strength, impaired perceived functional ability, impaired flexibility, improper body  mechanics, postural dysfunction, and pain.    ACTIVITY LIMITATIONS carrying, lifting, bending, sitting, standing, squatting, sleeping, stairs, transfers, bed mobility, dressing, hygiene/grooming, locomotion level, and caring for others   PARTICIPATION LIMITATIONS: meal prep, cleaning, laundry, interpersonal relationship, driving, shopping, community activity, and   basic mobility including household and community ambulation, transfers, walking her dogs, ADLs, IADLs, going to the beach, stairs, sleeping, carrying, lifting, bending, doing activities that require weight bearing without loss of balance, walking the dogs.   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    REHAB POTENTIAL: Good   CLINICAL DECISION MAKING: Stable/uncomplicated   EVALUATION COMPLEXITY: Low     GOALS: Goals reviewed with patient? No   SHORT TERM GOALS: Target date: 02/09/2022   Patient will be independent with initial home exercise program for self-management of symptoms. Baseline: Initial HEP to be provided at visit 2 as appropriate (01/26/22); participates as able but struggles to get reps/sets in due to fatigue/soreness (03/06/2022); Goal status: partially met     LONG TERM GOALS: Target date: 04/20/2022   Patient will be independent with a long-term home exercise program for self-management of symptoms.  Baseline: Initial HEP to be provided at visit 2 as appropriate (01/26/22); participates as able but struggles to get reps/sets in due to fatigue/soreness (03/06/2022); Goal status: In-progress    2.  Patient will demonstrate improved FOTO to equal or greater than 54 by visit #16 to demonstrate improvement in overall condition and self-reported functional ability.  Baseline: 30 (01/26/22); 58 at visit #10 (03/06/2022);  Goal status: In-progress   3.  Patient will demonstrate R sided single leg stance of equal or greater than 30 seconds with no trendelenburg compensation to decrease fall risk and improve household and community ambulation.  Baseline: 0 seconds (01/26/22); 20 seconds with significant trendelenburg, unable to hold without trendelenburg (03/06/2022); Goal status: In-progress   4.  Patient will ambulate equal or greater than 1000 feet with no AD during 6 min walk test to improve community ambulation.  Baseline: not tested (01/26/22); 617 feet with SPC. Altered gait pattern with trendelenburg pattern on R hip. Increased soreness (03/06/2022);  Goal status: In-progress   5.  Patient will complete community, work and/or  recreational activities without limitation due to current condition.  Baseline: difficulty with usual activities including basic mobility including household and community ambulation, transfers, walking her dogs, ADLs, IADLs, going to the beach, stairs, sleeping, carrying, lifting, bending, doing activities that require weight bearing without loss of balance(01/26/22); reports she still cannot walk the dogs, is able to go up and down a few steps, continues to have difficulty with similar limitations with mild improvement (03/06/2022);  Goal status: In-progress   6.  Patient will demonstrate B hip abduction MMT equal or greater than 4+/5 with no increase in pain to improve activity tolerance for walking her dog.  Baseline: R = 3/5 with pain, left not tested due to lack of tolerance for laying on R side (01/26/2022);  R = 4/5 with pain (patient also fatigues by 10 reps and is unable to do more than 2 sets of 10 reps of dynamic hip abduction), L = 4+/5 (03/06/2022);  Goal  status: In-progress       PLAN: PT FREQUENCY: 1-2x/week   PT DURATION: 12 weeks   PLANNED INTERVENTIONS: Therapeutic exercises, Therapeutic activity, Neuromuscular re-education, Balance training, Gait training, Patient/Family education, Joint mobilization, Stair training, DME instructions, Aquatic Therapy, Dry Needling, Electrical stimulation, Spinal mobilization, Cryotherapy, Moist heat, Manual therapy, and Re-evaluation   PLAN FOR NEXT SESSION: update HEP as appropriate, progressive strengthening, ROM, gait training, and balance within protocol guidelines. Work towards ability to tolerate R SLS.    Everlean Alstrom. Graylon Good, PT, DPT 03/23/22, 5:21 PM  [facility] [address] [place], [place] [address] P: [phone] I F: [phone]

## 2022-03-27 ENCOUNTER — Ambulatory Visit: Payer: Managed Care, Other (non HMO) | Admitting: Physical Therapy

## 2022-03-27 ENCOUNTER — Encounter: Payer: Self-pay | Admitting: Physical Therapy

## 2022-03-27 DIAGNOSIS — M25551 Pain in right hip: Secondary | ICD-10-CM

## 2022-03-27 DIAGNOSIS — M6281 Muscle weakness (generalized): Secondary | ICD-10-CM

## 2022-03-27 DIAGNOSIS — R2681 Unsteadiness on feet: Secondary | ICD-10-CM

## 2022-03-27 DIAGNOSIS — R262 Difficulty in walking, not elsewhere classified: Secondary | ICD-10-CM

## 2022-03-27 DIAGNOSIS — M25552 Pain in left hip: Secondary | ICD-10-CM

## 2022-03-27 NOTE — Therapy (Signed)
OUTPATIENT PHYSICAL THERAPY TREATMENT NOTE   Patient Name: Carolyn Esparza MRN: [id number] DOB:01-30-1966, 56 y.o., female Today's Date: 03/27/2022  PCP: [name], [name], MD REFERRING PROVIDER: Raelene Bott, PA  END OF SESSION:   PT End of Session - 03/27/22 1528     Visit Number 16    Number of Visits 24    Date for PT Re-Evaluation 04/20/22    Authorization Type [employer] reporting period from 03/06/2022    Authorization Time Period VL: max combined 60 PT/OT per year    Authorization - Visit Number 26    Authorization - Number of Visits 60    Progress Note Due on Visit 20    PT Start Time 1440    PT Stop Time 1518    PT Time Calculation (min) 38 min    Activity Tolerance Patient tolerated treatment well    Behavior During Therapy WFL for tasks assessed/performed                   Past Medical History:  Diagnosis Date   Anxiety    ASCUS with positive high risk HPV cervical    CAD (coronary artery disease)    Chicken pox    Chronic pain    neck, back, b/l hips    Chronic pain of right hip 8/88/2800   Complication of anesthesia    DDD (degenerative disc disease), lumbar 01/06/2021   Depression    Facet arthritis of lumbar region 01/06/2021   GERD (gastroesophageal reflux disease)    History of Crohn's disease    Hyperlipidemia    Left ovarian cyst    s/p removal of 1 ovary ? which one removed per pt    Libido, decreased    Skin cancer    reports skin cancer removed from skin in 2016 or 2017   Thyroid nodule    repeat US resolved and Ct neck 05/2019 normal thyroid    Past Surgical History:  Procedure Laterality Date   ABDOMINAL HYSTERECTOMY     APPENDECTOMY  2004   BLADDER SURGERY     BREAST BIOPSY     x 2   COLONOSCOPY     COLONOSCOPY WITH PROPOFOL N/A 10/25/2020   Procedure: COLONOSCOPY WITH PROPOFOL;  Surgeon: Mauri Pole, MD;  Location: [facility];  Service: Endoscopy;  Laterality: N/A;   HEMOSTASIS CLIP PLACEMENT  10/25/2020    Procedure: HEMOSTASIS CLIP PLACEMENT;  Surgeon: Mauri Pole, MD;  Location: [facility];  Service: Endoscopy;;   OOPHORECTOMY Right    Unilateral Right (per MRI 04/17/2018)   POLYPECTOMY  10/25/2020   Procedure: POLYPECTOMY;  Surgeon: Mauri Pole, MD;  Location: [facility];  Service: Endoscopy;;   SUBMUCOSAL LIFTING INJECTION  10/25/2020   Procedure: SUBMUCOSAL LIFTING INJECTION;  Surgeon: Mauri Pole, MD;  Location: [facility];  Service: Endoscopy;;   WISDOM TOOTH EXTRACTION     Patient Active Problem List   Diagnosis Date Noted   Cervicalgia 07/01/2021   Low back pain 07/01/2021   Herpes infection 07/01/2021   Chronic pain of right hip 04/28/2021   DDD (degenerative disc disease), lumbar 01/06/2021   Facet arthritis of lumbar region 01/06/2021   Trochanteric bursitis of both hips 11/30/2020   Special screening for malignant neoplasms, colon    Polyp of cecum    Pain in both feet 04/12/2020   Burning sensation of feet 04/12/2020   Pain of both hip joints 04/12/2020   Onychomycosis 04/12/2020   Fibrocystic breast disease (FCBD) 04/12/2020  Numbness in both legs 09/09/2019   Spinal stenosis in cervical region 05/09/2019   Facet arthropathy, cervical 05/09/2019   Chronic pain 11/27/2018   Obesity (BMI 30.0-34.9) 11/27/2018   Sprain and strain of hip and thigh 05/10/2018   Abnormal MRI, cervical spine 03/28/2018   Menopause 11/26/2017   Right upper quadrant pain 11/26/2017   Abnormal Pap smear of cervix 07/27/2017   Vasomotor flushing 07/19/2017   Depression 07/18/2017   Nausea 06/25/2017   Chronic pain of both hips 06/25/2017   OSA (obstructive sleep apnea) 04/18/2017   Annual physical exam 04/10/2017   Fatigue 12/16/2016   Chronic back pain 05/30/2016   Abdominal pain 03/12/2016   Anxiety and depression 12/28/2015   History of obstructive sleep apnea 12/15/2015   GERD (gastroesophageal reflux disease) 12/15/2015   Hyperlipidemia 12/15/2015    Pain medication agreement signed 03/25/2015   CAD in native artery 02/05/2015   Right supraspinatus tenosynovitis 08/17/2014   Crohn's disease involving terminal ileum ([place]) 03/30/2014   FH: colon polyps 03/30/2014   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 female who presents to outpatient physical therapy with a referral for medical diagnosis tear of right gluteus medius tendon, trochanteric bursitis of both hips, enthesopathy of hip region on both sides. This patient's chief complaints consist of right hip pain, stiffness, weakness, abnormal gait, difficulty with balance, and dysfunction s/p OPEN REPAIR R GLUTEUS MEDIUS gluteal repair 12/05/2021, leading to the following functional deficits: difficulty with usual activities and basic mobility including household and community ambulation, transfers, walking her dogs, ADLs, IADLs, going to the beach, stairs, sleeping, carrying, lifting, bending, doing activities that require weight bearing without loss of balance, walking the dogs. 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  Patient denies hx of stroke, seizures, lung problems, diabetes,  unexplained weight loss, unexplained changes in bowel or bladder problems, unexplained stumbling or dropping things, osteoporosis, and spinal surgery.  PRECAUTIONS: Other: can transition to weight bearing as tolerated.  See protocol in chart.  SUBJECTIVE: Patient arrives with [facility]. She states her hip pain is a little bit better than at last PT session. She was sore after last PT session and did not do her HEP the next day but did it yesterday and the day before. She did not remember to practice walking with her left hip hiked to improve alignment on R LE.  Back is better.   PAIN:  Are you having pain? Yes, 4/10 right lateral hip, 5/10 in low back.   OBJECTIVE  TODAY'S TREATMENT:  Therapeutic exercise: to centralize symptoms and improve ROM, strength, muscular endurance, and activity tolerance required for successful completion of functional activities.  - NuStep level 5 using bilateral upper and lower extremities. Seat/handle setting 6/8. For improved extremity  mobility, muscular endurance, and activity tolerance; and to induce the analgesic effect of aerobic exercise, stimulate improved joint nutrition, and prepare body structures and systems for following interventions. x 8 minutes. Average SPM = 64.   - ambulation over ground with SPC adjusted 1 notch higher with focus on improved hip alignment and internalizing cue to be able to replicate when walking everywhere outside of PT. Mirror feedback provided.   - standing modified R SLS with left foot on 9 inch step, lifting left hip to stay out of trendelenburg, 1x10 at 10 second holds. Cuing to increase weight bearing on L LE to prevent increased pain at right hip.   Circuit:  - standing AROM R hip abduction, 3x10 with 1 second holds.  - standing AROM R hip flexion knee extended, 3x10 with 1 second hold.   Seated rest Education about protocol, progress, and frequency of sessions  - review of ambulation 2x20 feet with SPC and verbal or mirror  feedback. (Still needed VC/TC for full carry over).   Pt required multimodal cuing for proper technique and to facilitate improved neuromuscular control, strength, range of motion, and functional ability resulting in improved performance and form.    PATIENT EDUCATION:  Education details:  Exercise purpose/form. Self management techniques. HEP including handout,  Reviewed cancelation/no-show policy with patient and confirmed patient has correct phone number for clinic; patient verbalized understanding (02/02/22). Person educated: Patient Education method: Explanation, demonstration, multimodal cuing.  Education comprehension: verbalized and demonstrated understanding and needs further education     HOME EXERCISE PROGRAM: Access Code: [email or web address] URL: [email or web address] Date: 03/23/2022 Prepared by: Rosita Kea  Exercises - Staggered Sit-to-Stand  - 1 x daily - 3 sets - 10 reps - Supine Bridge with Resistance Band  - 1 x daily - 1 sets - 20 reps - 5 seconds hold - Standing Hip Abduction with Resistance at Ankles and Unilateral Counter Support  - 1 x daily - 3 sets - 10 reps - 5 second hold hold - Standing Hip Flexion with Resistance Loop  - 1 x daily - 3 sets - 10 reps - 5 seconds hold - Single Leg Balance in March Position  - 1 x daily - 2-3 sets - 8-10 reps - 10 seconds hold   ASSESSMENT:   CLINICAL IMPRESSION: Patient tolerated treatment well with no increase in pain and appeared to tolerate PT session from last week with good tolerance for current HEP. Discussed and agreed upon trial of reducing visit frequency to 1x a week to allow her more time between exercise progressions. Patient has participated well in HEP in the past when she feels she is able to tolerate it. Patient reminded that PT is available to reach between appointments for questions if she feels stuck before the week is over. Plan to trial reduction of visit frequency to 1x per week with focus on slow  progressions in lateral hip strengthening. Patient would benefit from continued management of limiting condition by skilled physical therapist to address remaining impairments and functional limitations to work towards stated goals and return to PLOF or maximal functional independence.    From eval: Patient is a 56 y.o. female referred to outpatient physical therapy with a medical diagnosis of tear of right gluteus medius tendon, trochanteric bursitis of both hips, enthesopathy of hip region on both sides who presents with signs and symptoms consistent with right hip pain, stiffness, weakness, dysfunction, imbalance, gait disturbance s/p OPEN REPAIR R GLUTEUS MEDIUS gluteal repair 12/05/2021 and . Patient presents  with significant pain, ROM, posture, joint stiffness, tissue integrity, motor control, flexibility, gait, balance, muscle performance (strength/power/endurance) and activity tolerance impairments that are limiting ability to complete her usual activities and basic mobility including household and community ambulation, transfers, walking her dogs, ADLs, IADLs, going to the beach, stairs, sleeping, carrying, lifting, bending, doing activities that require weight bearing without loss of balance, walking the dogs without difficulty. Patient will benefit from skilled physical therapy intervention to address current body structure impairments and activity limitations to improve function and work towards goals set in current POC in order to return to prior level of function or maximal functional improvement.    OBJECTIVE IMPAIRMENTS Abnormal gait, decreased activity tolerance, decreased balance, decreased coordination, decreased endurance, decreased knowledge of use of DME, decreased mobility, difficulty walking, decreased ROM, decreased strength, impaired perceived functional ability, impaired flexibility, improper body mechanics, postural dysfunction, and pain.    ACTIVITY LIMITATIONS carrying, lifting,  bending, sitting, standing, squatting, sleeping, stairs, transfers, bed mobility, dressing, hygiene/grooming, locomotion level, and caring for others   PARTICIPATION LIMITATIONS: meal prep, cleaning, laundry, interpersonal relationship, driving, shopping, community activity, and   basic mobility including household and community ambulation, transfers, walking her dogs, ADLs, IADLs, going to the beach, stairs, sleeping, carrying, lifting, bending, doing activities that require weight bearing without loss of balance, walking the dogs.   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    REHAB POTENTIAL: Good   CLINICAL DECISION MAKING: Stable/uncomplicated   EVALUATION COMPLEXITY: Low     GOALS: Goals reviewed with patient? No   SHORT TERM GOALS: Target date: 02/09/2022   Patient will be independent with initial home exercise program for self-management of symptoms. Baseline: Initial HEP to be provided at visit 2 as appropriate (01/26/22); participates as able but struggles to get reps/sets in due to fatigue/soreness (03/06/2022); Goal status: partially met     LONG TERM GOALS: Target date: 04/20/2022   Patient will be independent with a long-term home exercise program for self-management of symptoms.  Baseline: Initial HEP to be provided at visit 2 as appropriate (01/26/22); participates as able but struggles to get reps/sets in due to fatigue/soreness (03/06/2022); Goal status: In-progress   2.  Patient will demonstrate improved FOTO to equal or greater than 54 by visit #16 to  demonstrate improvement in overall condition and self-reported functional ability.  Baseline: 30 (01/26/22); 58 at visit #10 (03/06/2022);  Goal status: In-progress   3.  Patient will demonstrate R sided single leg stance of equal or greater than 30 seconds with no trendelenburg compensation to decrease fall risk and improve household and community ambulation.  Baseline: 0 seconds (01/26/22); 20 seconds with significant trendelenburg, unable to hold without trendelenburg (03/06/2022); Goal status: In-progress   4.  Patient will ambulate equal or greater than 1000 feet with no AD during 6 min walk test to improve community ambulation.  Baseline: not tested (01/26/22); 617 feet with SPC. Altered gait pattern with trendelenburg pattern on R hip. Increased soreness (03/06/2022);  Goal status: In-progress   5.  Patient will complete community, work and/or recreational activities without limitation due to current condition.  Baseline: difficulty with usual activities including  basic mobility including household and community ambulation, transfers, walking her dogs, ADLs, IADLs, going to the beach, stairs, sleeping, carrying, lifting, bending, doing activities that require weight bearing without loss of balance(01/26/22); reports she still cannot walk the dogs, is able to go up and down a few steps, continues to have difficulty with similar limitations with mild improvement (03/06/2022);  Goal status: In-progress   6.  Patient will demonstrate B hip abduction MMT equal or greater than 4+/5 with no increase in pain to improve activity tolerance for walking her dog.  Baseline: R = 3/5 with pain, left not tested due to lack of tolerance for laying on R side (01/26/2022);  R = 4/5 with pain (patient also fatigues by 10 reps and is unable to do more than 2 sets of 10 reps of dynamic hip abduction), L = 4+/5 (03/06/2022);  Goal status: In-progress       PLAN: PT FREQUENCY: 1-2x/week   PT DURATION: 12 weeks    PLANNED INTERVENTIONS: Therapeutic exercises, Therapeutic activity, Neuromuscular re-education, Balance training, Gait training, Patient/Family education, Joint mobilization, Stair training, DME instructions, Aquatic Therapy, Dry Needling, Electrical stimulation, Spinal mobilization, Cryotherapy, Moist heat, Manual therapy, and Re-evaluation   PLAN FOR NEXT SESSION: update HEP as appropriate, progressive strengthening, ROM, gait training, and balance within protocol guidelines. Work towards ability to tolerate R SLS.    Everlean Alstrom. Graylon Good, PT, DPT 03/27/22, 3:37 PM  [facility] [address] [place], [place] [address] P: [phone] I F: [phone]

## 2022-03-30 ENCOUNTER — Ambulatory Visit: Payer: Managed Care, Other (non HMO) | Admitting: Physical Therapy

## 2022-04-04 ENCOUNTER — Encounter: Payer: Self-pay | Admitting: Physical Therapy

## 2022-04-04 ENCOUNTER — Ambulatory Visit: Payer: Managed Care, Other (non HMO) | Attending: Orthopedic Surgery | Admitting: Physical Therapy

## 2022-04-04 DIAGNOSIS — M25551 Pain in right hip: Secondary | ICD-10-CM | POA: Insufficient documentation

## 2022-04-04 DIAGNOSIS — M6281 Muscle weakness (generalized): Secondary | ICD-10-CM | POA: Insufficient documentation

## 2022-04-04 DIAGNOSIS — R2681 Unsteadiness on feet: Secondary | ICD-10-CM | POA: Insufficient documentation

## 2022-04-04 DIAGNOSIS — R262 Difficulty in walking, not elsewhere classified: Secondary | ICD-10-CM | POA: Insufficient documentation

## 2022-04-04 DIAGNOSIS — M25552 Pain in left hip: Secondary | ICD-10-CM | POA: Insufficient documentation

## 2022-04-04 NOTE — Therapy (Signed)
OUTPATIENT PHYSICAL THERAPY TREATMENT NOTE   Patient Name: Carolyn Esparza MRN: [id number] DOB:1966/04/13, 56 y.o., female Today's Date: 04/04/2022  PCP: [name], [name], MD REFERRING PROVIDER: Raelene Bott, PA  END OF SESSION:   PT End of Session - 04/04/22 1355     Visit Number 17    Number of Visits 24    Date for PT Re-Evaluation 04/20/22    Authorization Type [employer] reporting period from 03/06/2022    Authorization Time Period VL: max combined 60 PT/OT per year    Authorization - Visit Number 35    Authorization - Number of Visits 60    Progress Note Due on Visit 20    PT Start Time 1350    PT Stop Time 1428    PT Time Calculation (min) 38 min    Activity Tolerance Patient tolerated treatment well    Behavior During Therapy WFL for tasks assessed/performed                    Past Medical History:  Diagnosis Date   Anxiety    ASCUS with positive high risk HPV cervical    CAD (coronary artery disease)    Chicken pox    Chronic pain    neck, back, b/l hips    Chronic pain of right hip 08/26/5168   Complication of anesthesia    DDD (degenerative disc disease), lumbar 01/06/2021   Depression    Facet arthritis of lumbar region 01/06/2021   GERD (gastroesophageal reflux disease)    History of Crohn's disease    Hyperlipidemia    Left ovarian cyst    s/p removal of 1 ovary ? which one removed per pt    Libido, decreased    Skin cancer    reports skin cancer removed from skin in 2016 or 2017   Thyroid nodule    repeat US resolved and Ct neck 05/2019 normal thyroid    Past Surgical History:  Procedure Laterality Date   ABDOMINAL HYSTERECTOMY     APPENDECTOMY  2004   BLADDER SURGERY     BREAST BIOPSY     x 2   COLONOSCOPY     COLONOSCOPY WITH PROPOFOL N/A 10/25/2020   Procedure: COLONOSCOPY WITH PROPOFOL;  Surgeon: Mauri Pole, MD;  Location: [facility];  Service: Endoscopy;  Laterality: N/A;   HEMOSTASIS CLIP PLACEMENT  10/25/2020    Procedure: HEMOSTASIS CLIP PLACEMENT;  Surgeon: Mauri Pole, MD;  Location: [facility];  Service: Endoscopy;;   OOPHORECTOMY Right    Unilateral Right (per MRI 04/17/2018)   POLYPECTOMY  10/25/2020   Procedure: POLYPECTOMY;  Surgeon: Mauri Pole, MD;  Location: [facility];  Service: Endoscopy;;   SUBMUCOSAL LIFTING INJECTION  10/25/2020   Procedure: SUBMUCOSAL LIFTING INJECTION;  Surgeon: Mauri Pole, MD;  Location: [facility];  Service: Endoscopy;;   WISDOM TOOTH EXTRACTION     Patient Active Problem List   Diagnosis Date Noted   Cervicalgia 07/01/2021   Low back pain 07/01/2021   Herpes infection 07/01/2021   Chronic pain of right hip 04/28/2021   DDD (degenerative disc disease), lumbar 01/06/2021   Facet arthritis of lumbar region 01/06/2021   Trochanteric bursitis of both hips 11/30/2020   Special screening for malignant neoplasms, colon    Polyp of cecum    Pain in both feet 04/12/2020   Burning sensation of feet 04/12/2020   Pain of both hip joints 04/12/2020   Onychomycosis 04/12/2020   Fibrocystic breast disease (FCBD)  04/12/2020   Numbness in both legs 09/09/2019   Spinal stenosis in cervical region 05/09/2019   Facet arthropathy, cervical 05/09/2019   Chronic pain 11/27/2018   Obesity (BMI 30.0-34.9) 11/27/2018   Sprain and strain of hip and thigh 05/10/2018   Abnormal MRI, cervical spine 03/28/2018   Menopause 11/26/2017   Right upper quadrant pain 11/26/2017   Abnormal Pap smear of cervix 07/27/2017   Vasomotor flushing 07/19/2017   Depression 07/18/2017   Nausea 06/25/2017   Chronic pain of both hips 06/25/2017   OSA (obstructive sleep apnea) 04/18/2017   Annual physical exam 04/10/2017   Fatigue 12/16/2016   Chronic back pain 05/30/2016   Abdominal pain 03/12/2016   Anxiety and depression 12/28/2015   History of obstructive sleep apnea 12/15/2015   GERD (gastroesophageal reflux disease) 12/15/2015   Hyperlipidemia 12/15/2015    Pain medication agreement signed 03/25/2015   CAD in native artery 02/05/2015   Right supraspinatus tenosynovitis 08/17/2014   Crohn's disease involving terminal ileum ([place]) 03/30/2014   FH: colon polyps 03/30/2014   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 female who presents to outpatient physical therapy with a referral for medical diagnosis tear of right gluteus medius tendon, trochanteric bursitis of both hips, enthesopathy of hip region on both sides. This patient's chief complaints consist of right hip pain, stiffness, weakness, abnormal gait, difficulty with balance, and dysfunction s/p OPEN REPAIR R GLUTEUS MEDIUS gluteal repair 12/05/2021, leading to the following functional deficits: difficulty with usual activities and basic mobility including household and community ambulation, transfers, walking her dogs, ADLs, IADLs, going to the beach, stairs, sleeping, carrying, lifting, bending, doing activities that require weight bearing without loss of balance, walking the dogs. 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  Patient denies hx of stroke, seizures, lung problems, diabetes,  unexplained weight loss, unexplained changes in bowel or bladder problems, unexplained stumbling or dropping things, osteoporosis, and spinal surgery.  PRECAUTIONS: Other: can transition to weight bearing as tolerated.  See protocol in chart.  SUBJECTIVE: Patient arrives with [facility]. She states she took the day off after last PT session and then was able to do her HEP every other day. She did not use bands and only started holding for 5 seconds yesterday. She can tell she is a little more sore today but it is okay. She feels like 1x a week frequency will work out better for her. She is unsure why she is more sore the day after she comes to PT vs at home. It takes her about 20 min to do her HEP.  She does not feel like she is hurting as much since last PT session.   PAIN:  Are you having pain? Yes, 0-4/10 right lateral hip (0/10 sitting  and 4/10 or more with putting weight on it), 4/10 in low back.   OBJECTIVE  TODAY'S TREATMENT:  Therapeutic exercise: to centralize symptoms and improve ROM, strength, muscular endurance, and activity tolerance required for successful completion of functional activities.  - NuStep level 5 using bilateral upper and lower extremities. Seat/handle setting 6/8. For improved extremity mobility, muscular endurance, and activity tolerance; and to induce the analgesic effect of aerobic exercise, stimulate improved joint nutrition, and prepare body structures and systems for following interventions. x 5 minutes. Average SPM = 63.  - ambulation over ground with SPC adjusted 1 notch higher with focus on improved hip alignment and internalizing cue to be able to replicate when walking everywhere outside of PT. Patient still needs VC/TC to ambulate correctly. (No pain).   Circuit:  - standing modified R SLS with left foot on 9 inch step, lifting left hip to stay out of trendelenburg, 2x10 at 10 second holds. Cuing to increase weight bearing on L LE to prevent increased pain at right  hip.  - staggered stance sit <> stand with R foot 1/2 foot length posterior, 3x10.   Circuit:  - standing AROM R hip abduction, 3x10 with 5 second holds.  - standing AROM R hip flexion knee extended, 3x10 with 5 second hold.   Pt required multimodal cuing for proper technique and to facilitate improved neuromuscular control, strength, range of motion, and functional ability resulting in improved performance and form.    PATIENT EDUCATION:  Education details:  Exercise purpose/form. Self management techniques. HEP including handout,  Reviewed cancelation/no-show policy with patient and confirmed patient has correct phone number for clinic; patient verbalized understanding (02/02/22). Person educated: Patient Education method: Explanation, demonstration, multimodal cuing.  Education comprehension: verbalized and demonstrated understanding and needs further education     HOME EXERCISE PROGRAM: Access Code: [email or web address] URL: [email or web address] Date: 03/23/2022 Prepared by: Rosita Kea  Exercises - Staggered Sit-to-Stand  - 1 x daily - 3 sets - 10 reps - Supine Bridge with Resistance Band  - 1 x daily - 1 sets - 20 reps - 5 seconds hold - Standing Hip Abduction with Resistance at Ankles and Unilateral Counter Support  - 1 x daily - 3 sets - 10 reps - 5 second hold hold - Standing Hip Flexion with Resistance Loop  - 1 x daily - 3 sets - 10 reps - 5 seconds hold - Single Leg Balance in March Position  - 1 x daily - 2-3 sets - 8-10 reps - 10 seconds hold   ASSESSMENT:   CLINICAL IMPRESSION: Patient tolerated treatment well with no increase in pain overall. Did not complete 3 sets of modified single leg stance due to starting to get some increased discomfort in R hip. Plan to continue slow progression towards improved lateral hip strength as tolerated. Patient to continue with 5 second holds, work at improving gait pattern whenever walking, and work up to 3 sets of modified SLS  reps by next week if able. Patient would benefit from continued management of limiting condition by skilled physical therapist to address remaining impairments and functional limitations to work towards stated goals and return to PLOF or maximal functional independence.   From eval: Patient is a 56 y.o. female referred to outpatient physical therapy with a medical diagnosis of tear of right gluteus medius tendon, trochanteric bursitis of both hips, enthesopathy of hip region on both sides who presents with signs and symptoms consistent with right hip pain, stiffness, weakness, dysfunction, imbalance, gait  disturbance s/p OPEN REPAIR R GLUTEUS MEDIUS gluteal repair 12/05/2021 and . Patient presents with significant pain, ROM, posture, joint stiffness, tissue integrity, motor control, flexibility, gait, balance, muscle performance (strength/power/endurance) and activity tolerance impairments that are limiting ability to complete her usual activities and basic mobility including household and community ambulation, transfers, walking her dogs, ADLs, IADLs, going to the beach, stairs, sleeping, carrying, lifting, bending, doing activities that require weight bearing without loss of balance, walking the dogs without difficulty. Patient will benefit from skilled physical therapy intervention to address current body structure impairments and activity limitations to improve function and work towards goals set in current POC in order to return to prior level of function or maximal functional improvement.    OBJECTIVE IMPAIRMENTS Abnormal gait, decreased activity tolerance, decreased balance, decreased coordination, decreased endurance, decreased knowledge of use of DME, decreased mobility, difficulty walking, decreased ROM, decreased strength, impaired perceived functional ability, impaired flexibility, improper body mechanics, postural dysfunction, and pain.    ACTIVITY LIMITATIONS carrying, lifting, bending, sitting,  standing, squatting, sleeping, stairs, transfers, bed mobility, dressing, hygiene/grooming, locomotion level, and caring for others   PARTICIPATION LIMITATIONS: meal prep, cleaning, laundry, interpersonal relationship, driving, shopping, community activity, and   basic mobility including household and community ambulation, transfers, walking her dogs, ADLs, IADLs, going to the beach, stairs, sleeping, carrying, lifting, bending, doing activities that require weight bearing without loss of balance, walking the dogs.   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    REHAB POTENTIAL: Good   CLINICAL DECISION MAKING: Stable/uncomplicated   EVALUATION COMPLEXITY: Low     GOALS: Goals reviewed with patient? No   SHORT TERM GOALS: Target date: 02/09/2022   Patient will be independent with initial home exercise program for self-management of symptoms. Baseline: Initial HEP to be provided at visit 2 as appropriate (01/26/22); participates as able but struggles to get reps/sets in due to fatigue/soreness (03/06/2022); Goal status: partially met     LONG TERM GOALS: Target date: 04/20/2022   Patient will be independent with a long-term home exercise program for self-management of symptoms.  Baseline: Initial HEP to be provided at visit 2 as appropriate (01/26/22); participates as able but struggles to get reps/sets in due to fatigue/soreness (03/06/2022); Goal status: In-progress   2.  Patient will demonstrate improved FOTO to equal or greater than 54 by visit #16 to demonstrate  improvement in overall condition and self-reported functional ability.  Baseline: 30 (01/26/22); 58 at visit #10 (03/06/2022);  Goal status: In-progress   3.  Patient will demonstrate R sided single leg stance of equal or greater than 30 seconds with no trendelenburg compensation to decrease fall risk and improve household and community ambulation.  Baseline: 0 seconds (01/26/22); 20 seconds with significant trendelenburg, unable to hold without trendelenburg (03/06/2022); Goal status: In-progress   4.  Patient will ambulate equal or greater than 1000 feet with no AD during 6 min walk test to improve community ambulation.  Baseline: not tested (01/26/22); 617 feet with SPC. Altered gait pattern with trendelenburg pattern on R hip. Increased soreness (03/06/2022);  Goal status: In-progress   5.  Patient will complete community, work and/or recreational  activities without limitation due to current condition.  Baseline: difficulty with usual activities including basic mobility including household and community ambulation, transfers, walking her dogs, ADLs, IADLs, going to the beach, stairs, sleeping, carrying, lifting, bending, doing activities that require weight bearing without loss of balance(01/26/22); reports she still cannot walk the dogs, is able to go up and down a few steps, continues to have difficulty with similar limitations with mild improvement (03/06/2022);  Goal status: In-progress   6.  Patient will demonstrate B hip abduction MMT equal or greater than 4+/5 with no increase in pain to improve activity tolerance for walking her dog.  Baseline: R = 3/5 with pain, left not tested due to lack of tolerance for laying on R side (01/26/2022);  R = 4/5 with pain (patient also fatigues by 10 reps and is unable to do more than 2 sets of 10 reps of dynamic hip abduction), L = 4+/5 (03/06/2022);  Goal status: In-progress       PLAN: PT FREQUENCY: 1-2x/week   PT DURATION: 12 weeks   PLANNED  INTERVENTIONS: Therapeutic exercises, Therapeutic activity, Neuromuscular re-education, Balance training, Gait training, Patient/Family education, Joint mobilization, Stair training, DME instructions, Aquatic Therapy, Dry Needling, Electrical stimulation, Spinal mobilization, Cryotherapy, Moist heat, Manual therapy, and Re-evaluation   PLAN FOR NEXT SESSION: update HEP as appropriate, progressive strengthening, ROM, gait training, and balance within protocol guidelines. Work towards ability to tolerate R SLS.    Everlean Alstrom. Graylon Good, PT, DPT 04/04/22, 2:31 PM  [facility] [address] [place], [place] [address] P: [phone] I F: [phone]

## 2022-04-06 ENCOUNTER — Encounter: Payer: Managed Care, Other (non HMO) | Admitting: Physical Therapy

## 2022-04-11 ENCOUNTER — Ambulatory Visit: Payer: Managed Care, Other (non HMO) | Admitting: Physical Therapy

## 2022-04-11 ENCOUNTER — Encounter: Payer: Self-pay | Admitting: Physical Therapy

## 2022-04-11 DIAGNOSIS — M6281 Muscle weakness (generalized): Secondary | ICD-10-CM

## 2022-04-11 DIAGNOSIS — R262 Difficulty in walking, not elsewhere classified: Secondary | ICD-10-CM

## 2022-04-11 DIAGNOSIS — M25552 Pain in left hip: Secondary | ICD-10-CM

## 2022-04-11 DIAGNOSIS — R2681 Unsteadiness on feet: Secondary | ICD-10-CM

## 2022-04-11 DIAGNOSIS — M25551 Pain in right hip: Secondary | ICD-10-CM

## 2022-04-11 NOTE — Therapy (Signed)
OUTPATIENT PHYSICAL THERAPY TREATMENT NOTE   Patient Name: Carolyn Esparza MRN: [id number] DOB:1966/05/21, 56 y.o., female Today's Date: 04/11/2022  PCP: [name], [name], MD REFERRING PROVIDER: Raelene Bott, PA  END OF SESSION:   PT End of Session - 04/11/22 1358     Visit Number 18    Number of Visits 24    Date for PT Re-Evaluation 04/20/22    Authorization Type [employer] reporting period from 03/06/2022    Authorization Time Period VL: max combined 60 PT/OT per year    Authorization - Visit Number 9    Authorization - Number of Visits 60    Progress Note Due on Visit 20    PT Start Time 1962    PT Stop Time 1435    PT Time Calculation (min) 42 min    Activity Tolerance Patient tolerated treatment well    Behavior During Therapy WFL for tasks assessed/performed                     Past Medical History:  Diagnosis Date   Anxiety    ASCUS with positive high risk HPV cervical    CAD (coronary artery disease)    Chicken pox    Chronic pain    neck, back, b/l hips    Chronic pain of right hip 2/29/7989   Complication of anesthesia    DDD (degenerative disc disease), lumbar 01/06/2021   Depression    Facet arthritis of lumbar region 01/06/2021   GERD (gastroesophageal reflux disease)    History of Crohn's disease    Hyperlipidemia    Left ovarian cyst    s/p removal of 1 ovary ? which one removed per pt    Libido, decreased    Skin cancer    reports skin cancer removed from skin in 2016 or 2017   Thyroid nodule    repeat US resolved and Ct neck 05/2019 normal thyroid    Past Surgical History:  Procedure Laterality Date   ABDOMINAL HYSTERECTOMY     APPENDECTOMY  2004   BLADDER SURGERY     BREAST BIOPSY     x 2   COLONOSCOPY     COLONOSCOPY WITH PROPOFOL N/A 10/25/2020   Procedure: COLONOSCOPY WITH PROPOFOL;  Surgeon: Mauri Pole, MD;  Location: [facility];  Service: Endoscopy;  Laterality: N/A;   HEMOSTASIS CLIP PLACEMENT  10/25/2020    Procedure: HEMOSTASIS CLIP PLACEMENT;  Surgeon: Mauri Pole, MD;  Location: [facility];  Service: Endoscopy;;   OOPHORECTOMY Right    Unilateral Right (per MRI 04/17/2018)   POLYPECTOMY  10/25/2020   Procedure: POLYPECTOMY;  Surgeon: Mauri Pole, MD;  Location: [facility];  Service: Endoscopy;;   SUBMUCOSAL LIFTING INJECTION  10/25/2020   Procedure: SUBMUCOSAL LIFTING INJECTION;  Surgeon: Mauri Pole, MD;  Location: [facility];  Service: Endoscopy;;   WISDOM TOOTH EXTRACTION     Patient Active Problem List   Diagnosis Date Noted   Cervicalgia 07/01/2021   Low back pain 07/01/2021   Herpes infection 07/01/2021   Chronic pain of right hip 04/28/2021   DDD (degenerative disc disease), lumbar 01/06/2021   Facet arthritis of lumbar region 01/06/2021   Trochanteric bursitis of both hips 11/30/2020   Special screening for malignant neoplasms, colon    Polyp of cecum    Pain in both feet 04/12/2020   Burning sensation of feet 04/12/2020   Pain of both hip joints 04/12/2020   Onychomycosis 04/12/2020   Fibrocystic breast disease (  [facility]) 04/12/2020   Numbness in both legs 09/09/2019   Spinal stenosis in cervical region 05/09/2019   Facet arthropathy, cervical 05/09/2019   Chronic pain 11/27/2018   Obesity (BMI 30.0-34.9) 11/27/2018   Sprain and strain of hip and thigh 05/10/2018   Abnormal MRI, cervical spine 03/28/2018   Menopause 11/26/2017   Right upper quadrant pain 11/26/2017   Abnormal Pap smear of cervix 07/27/2017   Vasomotor flushing 07/19/2017   Depression 07/18/2017   Nausea 06/25/2017   Chronic pain of both hips 06/25/2017   OSA (obstructive sleep apnea) 04/18/2017   Annual physical exam 04/10/2017   Fatigue 12/16/2016   Chronic back pain 05/30/2016   Abdominal pain 03/12/2016   Anxiety and depression 12/28/2015   History of obstructive sleep apnea 12/15/2015   GERD (gastroesophageal reflux disease) 12/15/2015   Hyperlipidemia 12/15/2015    Pain medication agreement signed 03/25/2015   CAD in native artery 02/05/2015   Right supraspinatus tenosynovitis 08/17/2014   Crohn's disease involving terminal ileum ([place]) 03/30/2014   FH: colon polyps 03/30/2014   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 female who presents to outpatient physical therapy with a referral for medical diagnosis tear of right gluteus medius tendon, trochanteric bursitis of both hips, enthesopathy of hip region on both sides. This patient's chief complaints consist of right hip pain, stiffness, weakness, abnormal gait, difficulty with balance, and dysfunction s/p OPEN REPAIR R GLUTEUS MEDIUS gluteal repair 12/05/2021, leading to the following functional deficits: difficulty with usual activities and basic mobility including household and community ambulation, transfers, walking her dogs, ADLs, IADLs, going to the beach, stairs, sleeping, carrying, lifting, bending, doing activities that require weight bearing without loss of balance, walking the dogs. 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  Patient denies hx of stroke, seizures, lung problems, diabetes,  unexplained weight loss, unexplained changes in bowel or bladder problems, unexplained stumbling or dropping things, osteoporosis, and spinal surgery.  PRECAUTIONS: Other: can transition to weight bearing as tolerated.  See protocol in chart.  SUBJECTIVE: Patient arrives with [facility]. She states her hip pain is up to 4/10 today when standing on it. She states she was fine after last PT session. She likes the 1x a week visit frequency. She feels like she is doing better. She has gotten to where she can do all three sets of modified single leg stance alternated with staggered stance chair squats. She has not gotten back to the bands with standing hip open chain exercises. Patient states it takes her 35 min to do her HEP.   PAIN:  Are you having pain? Yes, 4/10 right lateral hip (0/10 sitting and 4/10 or more with putting weight on it), 5/10 in low back.   OBJECTIVE  TODAY'S TREATMENT:  Therapeutic exercise: to centralize symptoms and improve ROM, strength, muscular endurance, and activity tolerance required for successful completion of functional activities.  - NuStep level 5 using bilateral upper and lower extremities. Seat/handle setting 6/8. For improved extremity mobility, muscular endurance, and activity tolerance; and to induce the analgesic effect of aerobic exercise, stimulate improved joint nutrition, and prepare body structures and systems for following interventions. x 6:45 minutes. Average SPM = 68.    Circuit:  - standing modified R SLS with left foot on 9 inch step, lifting left hip to stay out of trendelenburg, 3x10 at 10 second holds. Cuing to increase weight bearing on L LE to prevent increased pain at right hip. Discussed weight shifts to increase load on R LE.  - staggered stance sit <> stand with R foot whole foot length posterior, 3x10.   Circuit:  - standing AROM R hip abduction, 3x10 with 1 second hold, YTB around ankles.  - standing AROM R hip flexion knee extended, 3x10 with 1  second, YTB around ankles.    Pt required multimodal cuing for proper technique and to facilitate improved neuromuscular control, strength, range of motion, and functional ability resulting in improved performance and form.    PATIENT EDUCATION:  Education details:  Exercise purpose/form. Self management techniques. HEP including handout,  Reviewed cancelation/no-show policy with patient and confirmed patient has correct phone number for clinic; patient verbalized understanding (02/02/22). Person educated: Patient Education method: Explanation, demonstration, multimodal cuing.  Education comprehension: verbalized and demonstrated understanding and needs further education     HOME EXERCISE PROGRAM: Access Code: [email or web address] URL: [email or web address] Date: 03/23/2022 Prepared by: Rosita Kea  Exercises - Staggered Sit-to-Stand  - 1 x daily - 3 sets - 10 reps - Supine Bridge with Resistance Band  - 1 x daily - 1 sets - 20 reps - 5 seconds hold - Standing Hip Abduction with Resistance at Ankles and Unilateral Counter Support  - 1 x daily - 3 sets - 10 reps - 5 second hold hold - Standing Hip Flexion with Resistance Loop  - 1 x daily - 3 sets - 10 reps - 5 seconds hold - Single Leg Balance in March Position  - 1 x daily - 2-3 sets - 8-10 reps - 10 seconds hold   ASSESSMENT:   CLINICAL IMPRESSION: Patient tolerated treatment well with no increase in pain overall. Did feel some soreness towards the end of her standing sets that she states is common for her when performing them at home. Gently progressed sit <> stand and open chain hip exercises. Patient is still walking with SPC and has not returned to PLOF. Patient would benefit from continued management of limiting condition by skilled physical therapist to address remaining impairments and functional limitations to work towards stated goals and return to PLOF or maximal functional independence.   From eval: Patient is a 56 y.o.  female referred to outpatient physical therapy with a medical diagnosis of tear of right gluteus medius tendon, trochanteric bursitis of both hips, enthesopathy of hip region on both sides who presents with signs and symptoms consistent with right hip pain, stiffness, weakness, dysfunction, imbalance, gait disturbance s/p OPEN REPAIR R GLUTEUS MEDIUS gluteal repair 12/05/2021 and . Patient presents with significant pain, ROM, posture, joint stiffness, tissue integrity, motor control, flexibility, gait, balance, muscle performance (strength/power/endurance) and activity tolerance impairments that are limiting ability to complete her usual activities and basic mobility including household and community ambulation, transfers, walking her dogs, ADLs, IADLs, going to the  beach, stairs, sleeping, carrying, lifting, bending, doing activities that require weight bearing without loss of balance, walking the dogs without difficulty. Patient will benefit from skilled physical therapy intervention to address current body structure impairments and activity limitations to improve function and work towards goals set in current POC in order to return to prior level of function or maximal functional improvement.    OBJECTIVE IMPAIRMENTS Abnormal gait, decreased activity tolerance, decreased balance, decreased coordination, decreased endurance, decreased knowledge of use of DME, decreased mobility, difficulty walking, decreased ROM, decreased strength, impaired perceived functional ability, impaired flexibility, improper body mechanics, postural dysfunction, and pain.    ACTIVITY LIMITATIONS carrying, lifting, bending, sitting, standing, squatting, sleeping, stairs, transfers, bed mobility, dressing, hygiene/grooming, locomotion level, and caring for others   PARTICIPATION LIMITATIONS: meal prep, cleaning, laundry, interpersonal relationship, driving, shopping, community activity, and   basic mobility including household and  community ambulation, transfers, walking her dogs, ADLs, IADLs, going to the beach, stairs, sleeping, carrying, lifting, bending, doing activities that require weight bearing without loss of balance, walking the dogs.   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    REHAB POTENTIAL: Good   CLINICAL DECISION MAKING: Stable/uncomplicated   EVALUATION COMPLEXITY: Low     GOALS: Goals reviewed with patient? No   SHORT TERM GOALS: Target date: 02/09/2022   Patient will be independent with initial home exercise program for self-management of symptoms. Baseline: Initial HEP to be provided at visit 2 as appropriate (01/26/22); participates as able but struggles to get reps/sets in due to fatigue/soreness (03/06/2022); Goal status: partially met     LONG TERM GOALS: Target date: 04/20/2022   Patient will be independent with a long-term home exercise program for self-management of symptoms.  Baseline: Initial HEP to be provided at visit 2 as appropriate (01/26/22); participates as able but struggles to get reps/sets in due to fatigue/soreness (03/06/2022); Goal status: In-progress   2.  Patient will demonstrate improved FOTO to equal or greater than 54 by visit #16 to demonstrate improvement in overall condition and self-reported functional ability.  Baseline: 30 (01/26/22); 58 at visit #10 (03/06/2022);  Goal status: In-progress   3.  Patient will demonstrate R sided single leg stance of equal or greater than 30 seconds with no trendelenburg compensation to decrease fall risk and  improve household and community ambulation.  Baseline: 0 seconds (01/26/22); 20 seconds with significant trendelenburg, unable to hold without trendelenburg (03/06/2022); Goal status: In-progress   4.  Patient will ambulate equal or greater than 1000 feet with no AD during 6 min walk test to improve community ambulation.  Baseline: not tested (01/26/22); 617 feet with SPC. Altered gait pattern with trendelenburg pattern on R hip. Increased soreness (03/06/2022);  Goal status: In-progress   5.  Patient will complete community, work and/or recreational activities without limitation due to current condition.  Baseline: difficulty with usual activities including basic mobility including household and community ambulation, transfers, walking her dogs, ADLs, IADLs, going to the beach, stairs, sleeping, carrying, lifting, bending, doing activities that require weight bearing without loss of balance(01/26/22); reports she still cannot walk the dogs, is able to go up and down a  few steps, continues to have difficulty with similar limitations with mild improvement (03/06/2022);  Goal status: In-progress   6.  Patient will demonstrate B hip abduction MMT equal or greater than 4+/5 with no increase in pain to improve activity tolerance for walking her dog.  Baseline: R = 3/5 with pain, left not tested due to lack of tolerance for laying on R side (01/26/2022);  R = 4/5 with pain (patient also fatigues by 10 reps and is unable to do more than 2 sets of 10 reps of dynamic hip abduction), L = 4+/5 (03/06/2022);  Goal status: In-progress       PLAN: PT FREQUENCY: 1-2x/week   PT DURATION: 12 weeks   PLANNED INTERVENTIONS: Therapeutic exercises, Therapeutic activity, Neuromuscular re-education, Balance training, Gait training, Patient/Family education, Joint mobilization, Stair training, DME instructions, Aquatic Therapy, Dry Needling, Electrical stimulation, Spinal mobilization, Cryotherapy, Moist heat, Manual therapy,  and Re-evaluation   PLAN FOR NEXT SESSION: update HEP as appropriate, progressive strengthening, ROM, gait training, and balance within protocol guidelines. Work towards ability to tolerate R SLS.    Everlean Alstrom. Graylon Good, PT, DPT 04/11/22, 2:35 PM  [facility] [address] [place], [place] [address] P: [phone] I F: [phone]

## 2022-04-13 ENCOUNTER — Encounter: Payer: Managed Care, Other (non HMO) | Admitting: Physical Therapy

## 2022-04-17 ENCOUNTER — Ambulatory Visit: Payer: Managed Care, Other (non HMO) | Admitting: Physical Therapy

## 2022-04-17 ENCOUNTER — Encounter: Payer: Self-pay | Admitting: Physical Therapy

## 2022-04-17 DIAGNOSIS — M25551 Pain in right hip: Secondary | ICD-10-CM

## 2022-04-17 DIAGNOSIS — M6281 Muscle weakness (generalized): Secondary | ICD-10-CM

## 2022-04-17 DIAGNOSIS — R2681 Unsteadiness on feet: Secondary | ICD-10-CM

## 2022-04-17 DIAGNOSIS — M25552 Pain in left hip: Secondary | ICD-10-CM

## 2022-04-17 DIAGNOSIS — R262 Difficulty in walking, not elsewhere classified: Secondary | ICD-10-CM

## 2022-04-17 NOTE — Therapy (Signed)
OUTPATIENT PHYSICAL THERAPY TREATMENT NOTE   Patient Name: Carolyn Esparza MRN: [id number] DOB:Nov 03, 1965, 56 y.o., female Today's Date: 04/17/2022  PCP: [name], [name], MD REFERRING PROVIDER: Raelene Bott, PA  END OF SESSION:   PT End of Session - 04/17/22 1437     Visit Number 19    Number of Visits 24    Date for PT Re-Evaluation 04/20/22    Authorization Type [employer] reporting period from 03/06/2022    Authorization Time Period VL: max combined 60 PT/OT per year    Authorization - Visit Number 19    Authorization - Number of Visits 60    Progress Note Due on Visit 20    PT Start Time 1434    PT Stop Time 1512    PT Time Calculation (min) 38 min    Activity Tolerance Patient tolerated treatment well    Behavior During Therapy WFL for tasks assessed/performed                      Past Medical History:  Diagnosis Date   Anxiety    ASCUS with positive high risk HPV cervical    CAD (coronary artery disease)    Chicken pox    Chronic pain    neck, back, b/l hips    Chronic pain of right hip 11/06/8117   Complication of anesthesia    DDD (degenerative disc disease), lumbar 01/06/2021   Depression    Facet arthritis of lumbar region 01/06/2021   GERD (gastroesophageal reflux disease)    History of Crohn's disease    Hyperlipidemia    Left ovarian cyst    s/p removal of 1 ovary ? which one removed per pt    Libido, decreased    Skin cancer    reports skin cancer removed from skin in 2016 or 2017   Thyroid nodule    repeat US resolved and Ct neck 05/2019 normal thyroid    Past Surgical History:  Procedure Laterality Date   ABDOMINAL HYSTERECTOMY     APPENDECTOMY  2004   BLADDER SURGERY     BREAST BIOPSY     x 2   COLONOSCOPY     COLONOSCOPY WITH PROPOFOL N/A 10/25/2020   Procedure: COLONOSCOPY WITH PROPOFOL;  Surgeon: Mauri Pole, MD;  Location: [facility];  Service: Endoscopy;  Laterality: N/A;   HEMOSTASIS CLIP PLACEMENT  10/25/2020    Procedure: HEMOSTASIS CLIP PLACEMENT;  Surgeon: Mauri Pole, MD;  Location: [facility];  Service: Endoscopy;;   OOPHORECTOMY Right    Unilateral Right (per MRI 04/17/2018)   POLYPECTOMY  10/25/2020   Procedure: POLYPECTOMY;  Surgeon: Mauri Pole, MD;  Location: [facility];  Service: Endoscopy;;   SUBMUCOSAL LIFTING INJECTION  10/25/2020   Procedure: SUBMUCOSAL LIFTING INJECTION;  Surgeon: Mauri Pole, MD;  Location: [facility];  Service: Endoscopy;;   WISDOM TOOTH EXTRACTION     Patient Active Problem List   Diagnosis Date Noted   Cervicalgia 07/01/2021   Low back pain 07/01/2021   Herpes infection 07/01/2021   Chronic pain of right hip 04/28/2021   DDD (degenerative disc disease), lumbar 01/06/2021   Facet arthritis of lumbar region 01/06/2021   Trochanteric bursitis of both hips 11/30/2020   Special screening for malignant neoplasms, colon    Polyp of cecum    Pain in both feet 04/12/2020   Burning sensation of feet 04/12/2020   Pain of both hip joints 04/12/2020   Onychomycosis 04/12/2020   Fibrocystic breast  disease (FCBD) 04/12/2020   Numbness in both legs 09/09/2019   Spinal stenosis in cervical region 05/09/2019   Facet arthropathy, cervical 05/09/2019   Chronic pain 11/27/2018   Obesity (BMI 30.0-34.9) 11/27/2018   Sprain and strain of hip and thigh 05/10/2018   Abnormal MRI, cervical spine 03/28/2018   Menopause 11/26/2017   Right upper quadrant pain 11/26/2017   Abnormal Pap smear of cervix 07/27/2017   Vasomotor flushing 07/19/2017   Depression 07/18/2017   Nausea 06/25/2017   Chronic pain of both hips 06/25/2017   OSA (obstructive sleep apnea) 04/18/2017   Annual physical exam 04/10/2017   Fatigue 12/16/2016   Chronic back pain 05/30/2016   Abdominal pain 03/12/2016   Anxiety and depression 12/28/2015   History of obstructive sleep apnea 12/15/2015   GERD (gastroesophageal reflux disease) 12/15/2015   Hyperlipidemia 12/15/2015    Pain medication agreement signed 03/25/2015   CAD in native artery 02/05/2015   Right supraspinatus tenosynovitis 08/17/2014   Crohn's disease involving terminal ileum ([place]) 03/30/2014   FH: colon polyps 03/30/2014   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 female who presents to outpatient physical therapy with a referral for medical diagnosis tear of right gluteus medius tendon, trochanteric bursitis of both hips, enthesopathy of hip region on both sides. This patient's chief complaints consist of right hip pain, stiffness, weakness, abnormal gait, difficulty with balance, and dysfunction s/p OPEN REPAIR R GLUTEUS MEDIUS gluteal repair 12/05/2021, leading to the following functional deficits: difficulty with usual activities and basic mobility including household and community ambulation, transfers, walking her dogs, ADLs, IADLs, going to the beach, stairs, sleeping, carrying, lifting, bending, doing activities that require weight bearing without loss of balance, walking the dogs. 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  Patient denies hx of stroke, seizures, lung problems, diabetes,  unexplained weight loss, unexplained changes in bowel or bladder problems, unexplained stumbling or dropping things, osteoporosis, and spinal surgery.  PRECAUTIONS: Other: can transition to weight bearing as tolerated.  See protocol in chart.  SUBJECTIVE: Patient arrives with [facility]. She states her R hip is feeling a bit aggravated due to walking too much. She states she tried walking to the end of the road and back with her husband and two dogs. This is about 2 blocks. She states she continues to do her HEP with 3 sets of 10 but has not added the therabands to the standing position. She felt okay after her last PT session and the day after.   PAIN:  Are you having pain? Yes, 4/10 right lateral hip,  5/10 in low back.   OBJECTIVE  TODAY'S TREATMENT:  Therapeutic exercise: to centralize symptoms and improve ROM, strength, muscular endurance, and activity tolerance required for successful completion of  functional activities.  - NuStep level 5 using bilateral upper and lower extremities. Seat/handle setting 6/8. For improved extremity mobility, muscular endurance, and activity tolerance; and to induce the analgesic effect of aerobic exercise, stimulate improved joint nutrition, and prepare body structures and systems for following interventions. x 5:42 minutes. Average SPM = 65.   - sidelying R hip abduction, 3x10, cuing for slow movement.  - staggered stance sit <> stand with R foot whole foot length posterior, 3x10.  - standing modified R SLS with left foot on 9 inch step, lifting left hip to stay out of trendelenburg, 3x10 at 10 second holds. Cuing to increase weight bearing on L LE to prevent increased pain at right hip.  - standing AROM R hip abduction, 1x10  Pt required multimodal cuing for proper technique and to facilitate improved neuromuscular control, strength, range of motion, and functional ability resulting in improved performance and form.    PATIENT EDUCATION:  Education details:   Exercise purpose/form. Self management techniques. HEP including handout,  Reviewed cancelation/no-show policy with patient and confirmed patient has correct phone number for clinic; patient verbalized understanding (02/02/22). Person educated: Patient Education method: Explanation, demonstration, multimodal cuing.  Education comprehension: verbalized and demonstrated understanding and needs further education     HOME EXERCISE PROGRAM: Access Code: [email or web address] URL: [email or web address] Date: 03/23/2022 Prepared by: Rosita Kea  Exercises - Staggered Sit-to-Stand  - 1 x daily - 3 sets - 10 reps - Supine Bridge with Resistance Band  - 1 x daily - 1 sets - 20 reps - 5 seconds hold - Standing Hip Abduction with Resistance at Ankles and Unilateral Counter Support  - 1 x daily - 3 sets - 10 reps - 5 second hold hold - Standing Hip Flexion with Resistance Loop  - 1 x daily - 3 sets - 10 reps - 5 seconds hold - Single Leg Balance in March Position  - 1 x daily - 2-3 sets - 8-10 reps - 10 seconds hold   ASSESSMENT:   CLINICAL IMPRESSION: Patient arrives with [facility] and continues to walk with significant trendelenburg gait. Patient continues to tolerate similar HEP since last PT session. Progressed lateral hip strengthening to sidelying hip abduction this session with good tolerance during session. Plan to assess tolerance over longer term when she returns next session. Patient advised to attempt to incorporate sidelying hip abduction into hep. Patient has not yet returned to [facility] and continues to be limited in ambulation, balance,and functional activities. Patient would benefit from continued management of limiting condition by skilled physical therapist to address remaining impairments and functional limitations to work towards stated goals and return to PLOF or maximal functional independence.    From eval: Patient is a 56 y.o. female referred to outpatient physical therapy with a  medical diagnosis of tear of right gluteus medius tendon, trochanteric bursitis of both hips, enthesopathy of hip region on both sides who presents with signs and symptoms consistent with right hip pain, stiffness, weakness, dysfunction, imbalance, gait disturbance s/p OPEN REPAIR R GLUTEUS MEDIUS gluteal repair 12/05/2021 and . Patient presents with significant pain, ROM, posture, joint stiffness, tissue integrity, motor control, flexibility, gait, balance, muscle performance (strength/power/endurance) and activity tolerance impairments that are limiting ability to complete her usual activities and basic mobility including household and community ambulation, transfers, walking her dogs, ADLs, IADLs, going to the beach, stairs, sleeping, carrying, lifting, bending, doing activities that require weight bearing without loss of balance, walking the dogs without difficulty. Patient will benefit from  skilled physical therapy intervention to address current body structure impairments and activity limitations to improve function and work towards goals set in current POC in order to return to prior level of function or maximal functional improvement.    OBJECTIVE IMPAIRMENTS Abnormal gait, decreased activity tolerance, decreased balance, decreased coordination, decreased endurance, decreased knowledge of use of DME, decreased mobility, difficulty walking, decreased ROM, decreased strength, impaired perceived functional ability, impaired flexibility, improper body mechanics, postural dysfunction, and pain.    ACTIVITY LIMITATIONS carrying, lifting, bending, sitting, standing, squatting, sleeping, stairs, transfers, bed mobility, dressing, hygiene/grooming, locomotion level, and caring for others   PARTICIPATION LIMITATIONS: meal prep, cleaning, laundry, interpersonal relationship, driving, shopping, community activity, and   basic mobility including household and community ambulation, transfers, walking her dogs, ADLs,  IADLs, going to the beach, stairs, sleeping, carrying, lifting, bending, doing activities that require weight bearing without loss of balance, walking the dogs.   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    REHAB POTENTIAL: Good   CLINICAL DECISION MAKING: Stable/uncomplicated   EVALUATION COMPLEXITY: Low     GOALS: Goals reviewed with patient? No   SHORT TERM GOALS: Target date: 02/09/2022   Patient will be independent with initial home exercise program for self-management of symptoms. Baseline: Initial HEP to be provided at visit 2 as appropriate (01/26/22); participates as able but struggles to get reps/sets in due to fatigue/soreness (03/06/2022); Goal status: partially met     LONG TERM GOALS: Target date: 04/20/2022   Patient will be independent with a long-term home exercise program for self-management of symptoms.  Baseline: Initial HEP to be provided at visit 2 as appropriate (01/26/22); participates as able but struggles to get reps/sets in due to fatigue/soreness (03/06/2022); Goal status: In-progress   2.  Patient will demonstrate improved FOTO to equal or greater than 54 by visit #16 to demonstrate improvement in overall condition and self-reported functional ability.  Baseline: 30 (01/26/22); 58 at visit #10 (03/06/2022);  Goal status: In-progress   3.  Patient will demonstrate R sided single leg stance of equal or greater than 30 seconds with no trendelenburg compensation to decrease fall risk and improve household and community ambulation.  Baseline: 0  seconds (01/26/22); 20 seconds with significant trendelenburg, unable to hold without trendelenburg (03/06/2022); Goal status: In-progress   4.  Patient will ambulate equal or greater than 1000 feet with no AD during 6 min walk test to improve community ambulation.  Baseline: not tested (01/26/22); 617 feet with SPC. Altered gait pattern with trendelenburg pattern on R hip. Increased soreness (03/06/2022);  Goal status: In-progress   5.  Patient will complete community, work and/or recreational activities without limitation due to current condition.  Baseline: difficulty with usual activities including basic mobility including household and community ambulation, transfers, walking her dogs, ADLs, IADLs, going to the beach, stairs, sleeping, carrying, lifting, bending, doing activities that require weight bearing without loss of balance(01/26/22); reports she still cannot walk the dogs, is able to go up and down a few steps, continues to have difficulty with similar limitations with mild improvement (03/06/2022);  Goal status: In-progress   6.  Patient will demonstrate B  hip abduction MMT equal or greater than 4+/5 with no increase in pain to improve activity tolerance for walking her dog.  Baseline: R = 3/5 with pain, left not tested due to lack of tolerance for laying on R side (01/26/2022);  R = 4/5 with pain (patient also fatigues by 10 reps and is unable to do more than 2 sets of 10 reps of dynamic hip abduction), L = 4+/5 (03/06/2022);  Goal status: In-progress       PLAN: PT FREQUENCY: 1-2x/week   PT DURATION: 12 weeks   PLANNED INTERVENTIONS: Therapeutic exercises, Therapeutic activity, Neuromuscular re-education, Balance training, Gait training, Patient/Family education, Joint mobilization, Stair training, DME instructions, Aquatic Therapy, Dry Needling, Electrical stimulation, Spinal mobilization, Cryotherapy, Moist heat, Manual therapy, and Re-evaluation   PLAN FOR NEXT SESSION: update HEP  as appropriate, progressive strengthening, ROM, gait training, and balance within protocol guidelines. Work towards ability to tolerate R SLS.    Everlean Alstrom. Graylon Good, PT, DPT 04/17/22, 8:23 PM  [facility] [address] [place], [place] [address] P: [phone] I F: [phone]

## 2022-04-20 ENCOUNTER — Encounter: Payer: Managed Care, Other (non HMO) | Admitting: Physical Therapy

## 2022-04-23 ENCOUNTER — Other Ambulatory Visit: Payer: Self-pay | Admitting: Internal Medicine

## 2022-04-24 ENCOUNTER — Encounter: Payer: Self-pay | Admitting: Physical Therapy

## 2022-04-24 ENCOUNTER — Ambulatory Visit: Payer: Managed Care, Other (non HMO) | Admitting: Physical Therapy

## 2022-04-24 DIAGNOSIS — M25552 Pain in left hip: Secondary | ICD-10-CM

## 2022-04-24 DIAGNOSIS — R262 Difficulty in walking, not elsewhere classified: Secondary | ICD-10-CM

## 2022-04-24 DIAGNOSIS — M25551 Pain in right hip: Secondary | ICD-10-CM

## 2022-04-24 DIAGNOSIS — R2681 Unsteadiness on feet: Secondary | ICD-10-CM

## 2022-04-24 DIAGNOSIS — M6281 Muscle weakness (generalized): Secondary | ICD-10-CM

## 2022-04-24 NOTE — Therapy (Signed)
OUTPATIENT PHYSICAL THERAPY TREATMENT NOTE / PROGRESS NOTE / RE-CERTIFICATION Dates of reporting 03/06/2022 to 04/24/2022   Patient Name: Carolyn Esparza MRN: [id number] DOB:Jul 19, 1966, 56 y.o., female Today's Date: 04/24/2022  PCP: [name], Pasty Spillers, MD REFERRING PROVIDER: Kendrick Fries, PA  END OF SESSION:   PT End of Session - 04/24/22 1441     Visit Number 20    Number of Visits 24    Date for PT Re-Evaluation 07/17/22    Authorization Type [employer] reporting period from 03/06/2022    Authorization Time Period VL: max combined 60 PT/OT per year    Authorization - Visit Number 20    Authorization - Number of Visits 60    Progress Note Due on Visit 20    PT Start Time 1433    PT Stop Time 1512    PT Time Calculation (min) 39 min    Activity Tolerance Patient tolerated treatment well    Behavior During Therapy WFL for tasks assessed/performed              Past Medical History:  Diagnosis Date   Anxiety    ASCUS with positive high risk HPV cervical    CAD (coronary artery disease)    Chicken pox    Chronic pain    neck, back, b/l hips    Chronic pain of right hip 04/28/2021   Complication of anesthesia    DDD (degenerative disc disease), lumbar 01/06/2021   Depression    Facet arthritis of lumbar region 01/06/2021   GERD (gastroesophageal reflux disease)    History of Crohn's disease    Hyperlipidemia    Left ovarian cyst    s/p removal of 1 ovary ? which one removed per pt    Libido, decreased    Skin cancer    reports skin cancer removed from skin in 2016 or 2017   Thyroid nodule    repeat US resolved and Ct neck 05/2019 normal thyroid    Past Surgical History:  Procedure Laterality Date   ABDOMINAL HYSTERECTOMY     APPENDECTOMY  2004   BLADDER SURGERY     BREAST BIOPSY     x 2   COLONOSCOPY     COLONOSCOPY WITH PROPOFOL N/A 10/25/2020   Procedure: COLONOSCOPY WITH PROPOFOL;  Surgeon: Napoleon Form, MD;  Location: [facility];  Service:  Endoscopy;  Laterality: N/A;   HEMOSTASIS CLIP PLACEMENT  10/25/2020   Procedure: HEMOSTASIS CLIP PLACEMENT;  Surgeon: Napoleon Form, MD;  Location: [facility];  Service: Endoscopy;;   OOPHORECTOMY Right    Unilateral Right (per MRI 04/17/2018)   POLYPECTOMY  10/25/2020   Procedure: POLYPECTOMY;  Surgeon: Napoleon Form, MD;  Location: [facility];  Service: Endoscopy;;   SUBMUCOSAL LIFTING INJECTION  10/25/2020   Procedure: SUBMUCOSAL LIFTING INJECTION;  Surgeon: Napoleon Form, MD;  Location: [facility];  Service: Endoscopy;;   WISDOM TOOTH EXTRACTION     Patient Active Problem List   Diagnosis Date Noted   Cervicalgia 07/01/2021   Low back pain 07/01/2021   Herpes infection 07/01/2021   Chronic pain of right hip 04/28/2021   DDD (degenerative disc disease), lumbar 01/06/2021   Facet arthritis of lumbar region 01/06/2021   Trochanteric bursitis of both hips 11/30/2020   Special screening for malignant neoplasms, colon    Polyp of cecum    Pain in both feet 04/12/2020   Burning sensation of feet 04/12/2020   Pain of both hip joints 04/12/2020   Onychomycosis 04/12/2020  Fibrocystic breast disease (FCBD) 04/12/2020   Numbness in both legs 09/09/2019   Spinal stenosis in cervical region 05/09/2019   Facet arthropathy, cervical 05/09/2019   Chronic pain 11/27/2018   Obesity (BMI 30.0-34.9) 11/27/2018   Sprain and strain of hip and thigh 05/10/2018   Abnormal MRI, cervical spine 03/28/2018   Menopause 11/26/2017   Right upper quadrant pain 11/26/2017   Abnormal Pap smear of cervix 07/27/2017   Vasomotor flushing 07/19/2017   Depression 07/18/2017   Nausea 06/25/2017   Chronic pain of both hips 06/25/2017   OSA (obstructive sleep apnea) 04/18/2017   Annual physical exam 04/10/2017   Fatigue 12/16/2016   Chronic back pain 05/30/2016   Abdominal pain 03/12/2016   Anxiety and depression 12/28/2015   History of obstructive sleep apnea 12/15/2015   GERD  (gastroesophageal reflux disease) 12/15/2015   Hyperlipidemia 12/15/2015   Pain medication agreement signed 03/25/2015   CAD in native artery 02/05/2015   Right supraspinatus tenosynovitis 08/17/2014   Crohn's disease involving terminal ileum ([place]) 03/30/2014   FH: colon polyps 03/30/2014   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 female who presents to outpatient physical therapy with a referral for medical diagnosis tear of right gluteus medius tendon, trochanteric bursitis of both hips, enthesopathy of hip region on both sides. This patient's chief complaints consist of right hip pain, stiffness, weakness, abnormal gait, difficulty with balance, and dysfunction s/p OPEN REPAIR R GLUTEUS MEDIUS gluteal repair 12/05/2021, leading to the following functional deficits: difficulty with usual activities and basic mobility including household and community ambulation, transfers, walking her dogs, ADLs, IADLs, going to the beach, stairs, sleeping, carrying, lifting, bending, doing activities that require weight bearing without loss of balance, walking the dogs. 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  Patient denies hx of stroke, seizures, lung problems, diabetes, unexplained weight loss, unexplained changes in bowel or bladder problems, unexplained stumbling or dropping things, osteoporosis, and spinal surgery.  PRECAUTIONS: Other: can transition to weight bearing as tolerated.  See protocol in chart.  SUBJECTIVE: Patient arrives with [facility]. States she has no pain upon arrival. She states she did not have a lot of pain after last PT session. She gets some soreness and fatigue by the end of her HEP. She has been doing hip abduction AROM every other day and bridge with band every other day.   PAIN:  Are you having pain? denies  OBJECTIVE   SELF-REPORTED FUNCTION FOTO score: 47/100 (upper leg questionnaire)   MUSCLE PERFORMANCE (MMT):  *Indicates pain 01/26/22 03/06/22 04/24/22  Joint/Motion R/L R/L R/L  Hip        Flexion (L1, L2) 3+/4+ 5/5 5/5  Extension (knee ext) 4/4+ 4+/4+ 4+/5  Abduction 3*/ 4*/4+ 4/5  Adduction / / /  External rotation / / /  Internal rotation  / / /  Knee        Extension (L3) 5/5 / /  Flexion (S2) 5/5 / /  Ankle/Foot        Dorsiflexion (L4) 5/5 / /  Great toe extension (L5) 4+/4+ / /  Eversion (S1) 5/5 / /  Comments:    FUNCTIONAL/BALANCE EXERCISES 6 Minute Walk Test: 759 feet with SPC. Altered gait pattern with trendelenburg pattern on R hip. No increased soreness.    Single leg stance, firm surface, eyes open: R= 20 seconds with mild trendelenburg, L= > 30 seconds.    TODAY'S TREATMENT:  Therapeutic exercise: to centralize symptoms and improve ROM, strength, muscular endurance, and activity tolerance required for successful completion of functional activities.  - testing to assess progress (see above) - standing R SLS lifting left hip to stay out of trendelenburg, 2x10 at 10 second holds.  - sit to stand with staggered stance (R foot 1/2 foot length back) from 18 inch chair, 3x10.  Pt required multimodal cuing for proper technique and to  facilitate improved neuromuscular control, strength, range of motion, and functional ability resulting in improved performance and form.    PATIENT EDUCATION:  Education details:  Exercise purpose/form. Self management techniques. HEP including handout,  Reviewed cancelation/no-show policy with patient and confirmed patient has correct phone number for clinic; patient verbalized understanding (02/02/22). Person educated: Patient Education method: Explanation, demonstration, multimodal cuing.  Education comprehension: verbalized and demonstrated understanding and needs further education     HOME EXERCISE PROGRAM: Access Code: [email or web address] URL: [email or web address] Date: 03/23/2022 Prepared by: Rosita Kea  Exercises - Staggered Sit-to-Stand  - 1 x daily - 3 sets - 10 reps - Supine Bridge with Resistance Band  - 1 x daily - 1 sets - 20 reps - 5 seconds hold - Standing Hip Abduction with Resistance at Ankles and Unilateral Counter Support  - 1 x daily - 3 sets - 10 reps - 5 second hold hold - Standing Hip Flexion with Resistance Loop  - 1 x daily - 3 sets - 10 reps - 5 seconds hold - Single Leg Balance in March Position  - 1 x daily - 2-3 sets - 8-10 reps - 10 seconds hold   ASSESSMENT:   CLINICAL IMPRESSION: Patient has attended 20 physical therapy sessions since starting current episode of care 01/26/2022. Patient has had limited activity tolerance, which has delayed her progression since starting PT. However, she has more recently been attending PT once a week which has given her more time to work on strengthening between visits which she seems to be tolerating better. She was able to stand on R SLS for 20 seconds with minimal trendelenburg today and appears to be approaching readiness to wean from the single point cane and RW. She has also increased her 6MWT and tolerance for sidelying R hip abduction with repetitions. Patient has not yet reached her maximum potential  improvement and would benefit from continued PT. Plan to continue working on progressive R hip, functional, and LE strengthening and balance for return to prior level of function. Patient would benefit from continued management of limiting condition by skilled physical therapist to address remaining impairments and functional limitations to work towards stated goals and return to PLOF or maximal functional independence.   From eval: Patient is a 56 y.o. female referred to outpatient physical therapy with a medical diagnosis of tear of right gluteus medius tendon, trochanteric bursitis of both hips,  enthesopathy of hip region on both sides who presents with signs and symptoms consistent with right hip pain, stiffness, weakness, dysfunction, imbalance, gait disturbance s/p OPEN REPAIR R GLUTEUS MEDIUS gluteal repair 12/05/2021 and . Patient presents with significant pain, ROM, posture, joint stiffness, tissue integrity, motor control, flexibility, gait, balance, muscle performance (strength/power/endurance) and activity tolerance impairments that are limiting ability to complete her usual activities and basic mobility including household and community ambulation, transfers, walking her dogs, ADLs, IADLs, going to the beach, stairs, sleeping, carrying, lifting, bending, doing activities that require weight bearing without loss of balance, walking the dogs without difficulty. Patient will benefit from skilled physical therapy intervention to address current body structure impairments and activity limitations to improve function and work towards goals set in current POC in order to return to prior level of function or maximal functional improvement.    OBJECTIVE IMPAIRMENTS Abnormal gait, decreased activity tolerance, decreased balance, decreased coordination, decreased endurance, decreased knowledge of use of DME, decreased mobility, difficulty walking, decreased ROM, decreased strength, impaired perceived functional  ability, impaired flexibility, improper body mechanics, postural dysfunction, and pain.    ACTIVITY LIMITATIONS carrying, lifting, bending, sitting, standing, squatting, sleeping, stairs, transfers, bed mobility, dressing, hygiene/grooming, locomotion level, and caring for others   PARTICIPATION LIMITATIONS: meal prep, cleaning, laundry, interpersonal relationship, driving, shopping, community activity, and   basic mobility including household and community ambulation, transfers, walking her dogs, ADLs, IADLs, going to the beach, stairs, sleeping, carrying, lifting, bending, doing activities that require weight bearing without loss of balance, walking the dogs.   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    REHAB POTENTIAL: Good   CLINICAL DECISION MAKING: Stable/uncomplicated   EVALUATION COMPLEXITY: Low     GOALS: Goals reviewed with patient? No   SHORT TERM GOALS: Target date: 02/09/2022   Patient will be independent with initial home exercise program for self-management of symptoms. Baseline: Initial HEP to be provided at visit 2 as appropriate (01/26/22); participates as able but struggles to get reps/sets in due to fatigue/soreness (03/06/2022); Goal status: partially met     LONG TERM GOALS: Target date: 04/20/2022. Updated to 07/17/2022 for all unmet goals on 04/24/2022.    Patient will be independent with a long-term home exercise program for self-management of symptoms.  Baseline: Initial HEP to be provided at visit 2 as appropriate (01/26/22); participates as  able but struggles to get reps/sets in due to fatigue/soreness (03/06/2022); participating well (04/24/2022);  Goal status: In-progress   2.  Patient will demonstrate improved FOTO to equal or greater than 54 by visit #16 to demonstrate improvement in overall condition and self-reported functional ability.  Baseline: 30 (01/26/22); 58 at visit #10 (03/06/2022); 47 at visit #20 (04/24/2022);  Goal status: In-progress   3.  Patient will demonstrate R sided single leg stance of equal or greater than 30 seconds with no trendelenburg compensation to decrease fall risk and improve household and community ambulation.  Baseline: 0 seconds (01/26/22); 20 seconds with significant trendelenburg, unable to hold without trendelenburg (03/06/2022); 20 seconds with mild trendelenburg, (04/24/2022); Goal status: In-progress   4.  Patient will ambulate equal or greater than 1000 feet  with no AD during 6 min walk test to improve community ambulation.  Baseline: not tested (01/26/22); 617 feet with SPC. Altered gait pattern with trendelenburg pattern on R hip. Increased soreness (03/06/2022); 759 feet with SPC. Altered gait pattern with trendelenburg pattern on R hip. No increased soreness (04/24/2022); Goal status: In-progress   5.  Patient will complete community, work and/or recreational activities without limitation due to current condition.  Baseline: difficulty with usual activities including basic mobility including household and community ambulation, transfers, walking her dogs, ADLs, IADLs, going to the beach, stairs, sleeping, carrying, lifting, bending, doing activities that require weight bearing without loss of balance(01/26/22); reports she still cannot walk the dogs, is able to go up and down a few steps, continues to have difficulty with similar limitations with mild improvement (03/06/2022); has started taking some short walks to end of driveway with dogs and husband to help with dogs as needed, able to take more  steps without AD at home (04/24/2022);  Goal status: In-progress   6.  Patient will demonstrate B hip abduction MMT equal or greater than 4+/5 with no increase in pain to improve activity tolerance for walking her dog.  Baseline: R = 3/5 with pain, left not tested due to lack of tolerance for laying on R side (01/26/2022);  R = 4/5 with pain (patient also fatigues by 10 reps and is unable to do more than 2 sets of 10 reps of dynamic hip abduction), L = 4+/5 (03/06/2022);  R = 4/5 without pain but feels tired (04/24/2022);  Goal status: In-progress       PLAN: PT FREQUENCY: 1-2x/week   PT DURATION: 12 weeks   PLANNED INTERVENTIONS: Therapeutic exercises, Therapeutic activity, Neuromuscular re-education, Balance training, Gait training, Patient/Family education, Joint mobilization, Stair training, DME instructions, Aquatic Therapy, Dry Needling, Electrical stimulation, Spinal mobilization, Cryotherapy, Moist heat, Manual therapy, and Re-evaluation   PLAN FOR NEXT SESSION: update HEP as appropriate, progressive strengthening, ROM, gait training, and balance within protocol guidelines. Work towards ability to tolerate R SLS.    Everlean Alstrom. Graylon Good, PT, DPT 04/24/22, 7:06 PM  [facility] [address] [place], [place] [address] P: [phone] I F: [phone]

## 2022-04-25 ENCOUNTER — Encounter: Payer: Self-pay | Admitting: Internal Medicine

## 2022-04-27 ENCOUNTER — Encounter: Payer: Managed Care, Other (non HMO) | Admitting: Physical Therapy

## 2022-05-02 ENCOUNTER — Encounter: Payer: Self-pay | Admitting: Physical Therapy

## 2022-05-02 ENCOUNTER — Ambulatory Visit: Payer: Managed Care, Other (non HMO) | Attending: Orthopedic Surgery

## 2022-05-02 DIAGNOSIS — R262 Difficulty in walking, not elsewhere classified: Secondary | ICD-10-CM | POA: Insufficient documentation

## 2022-05-02 DIAGNOSIS — M6281 Muscle weakness (generalized): Secondary | ICD-10-CM | POA: Insufficient documentation

## 2022-05-02 DIAGNOSIS — M25552 Pain in left hip: Secondary | ICD-10-CM | POA: Insufficient documentation

## 2022-05-02 DIAGNOSIS — R2681 Unsteadiness on feet: Secondary | ICD-10-CM | POA: Diagnosis present

## 2022-05-02 DIAGNOSIS — M25551 Pain in right hip: Secondary | ICD-10-CM | POA: Diagnosis present

## 2022-05-02 NOTE — Therapy (Signed)
OUTPATIENT PHYSICAL THERAPY TREATMENT NOTE    Patient Name: Carolyn Esparza MRN: [id number] DOB:1965/12/16, 56 y.o., female Today's Date: 05/02/2022  PCP: [name], [name], MD REFERRING PROVIDER: Raelene Bott, PA  END OF SESSION:   PT End of Session - 05/02/22 1303     Visit Number 21    Number of Visits 24    Date for PT Re-Evaluation 07/17/22    Authorization Type [employer] reporting period from 03/06/2022    Authorization Time Period VL: max combined 60 PT/OT per year    Authorization - Visit Number 63    Authorization - Number of Visits 60    Progress Note Due on Visit 20    PT Start Time 1301    PT Stop Time 1344    PT Time Calculation (min) 43 min    Activity Tolerance Patient tolerated treatment well    Behavior During Therapy WFL for tasks assessed/performed              Past Medical History:  Diagnosis Date   Anxiety    ASCUS with positive high risk HPV cervical    CAD (coronary artery disease)    Chicken pox    Chronic pain    neck, back, b/l hips    Chronic pain of right hip 6/72/0947   Complication of anesthesia    DDD (degenerative disc disease), lumbar 01/06/2021   Depression    Facet arthritis of lumbar region 01/06/2021   GERD (gastroesophageal reflux disease)    History of Crohn's disease    Hyperlipidemia    Left ovarian cyst    s/p removal of 1 ovary ? which one removed per pt    Libido, decreased    Skin cancer    reports skin cancer removed from skin in 2016 or 2017   Thyroid nodule    repeat US resolved and Ct neck 05/2019 normal thyroid    Past Surgical History:  Procedure Laterality Date   ABDOMINAL HYSTERECTOMY     APPENDECTOMY  2004   BLADDER SURGERY     BREAST BIOPSY     x 2   COLONOSCOPY     COLONOSCOPY WITH PROPOFOL N/A 10/25/2020   Procedure: COLONOSCOPY WITH PROPOFOL;  Surgeon: Mauri Pole, MD;  Location: [facility];  Service: Endoscopy;  Laterality: N/A;   HEMOSTASIS CLIP PLACEMENT  10/25/2020   Procedure:  HEMOSTASIS CLIP PLACEMENT;  Surgeon: Mauri Pole, MD;  Location: [facility];  Service: Endoscopy;;   OOPHORECTOMY Right    Unilateral Right (per MRI 04/17/2018)   POLYPECTOMY  10/25/2020   Procedure: POLYPECTOMY;  Surgeon: Mauri Pole, MD;  Location: [facility];  Service: Endoscopy;;   SUBMUCOSAL LIFTING INJECTION  10/25/2020   Procedure: SUBMUCOSAL LIFTING INJECTION;  Surgeon: Mauri Pole, MD;  Location: [facility];  Service: Endoscopy;;   WISDOM TOOTH EXTRACTION     Patient Active Problem List   Diagnosis Date Noted   Cervicalgia 07/01/2021   Low back pain 07/01/2021   Herpes infection 07/01/2021   Chronic pain of right hip 04/28/2021   DDD (degenerative disc disease), lumbar 01/06/2021   Facet arthritis of lumbar region 01/06/2021   Trochanteric bursitis of both hips 11/30/2020   Special screening for malignant neoplasms, colon    Polyp of cecum    Pain in both feet 04/12/2020   Burning sensation of feet 04/12/2020   Pain of both hip joints 04/12/2020   Onychomycosis 04/12/2020   Fibrocystic breast disease (FCBD) 04/12/2020   Numbness in  both legs 09/09/2019   Spinal stenosis in cervical region 05/09/2019   Facet arthropathy, cervical 05/09/2019   Chronic pain 11/27/2018   Obesity (BMI 30.0-34.9) 11/27/2018   Sprain and strain of hip and thigh 05/10/2018   Abnormal MRI, cervical spine 03/28/2018   Menopause 11/26/2017   Right upper quadrant pain 11/26/2017   Abnormal Pap smear of cervix 07/27/2017   Vasomotor flushing 07/19/2017   Depression 07/18/2017   Nausea 06/25/2017   Chronic pain of both hips 06/25/2017   OSA (obstructive sleep apnea) 04/18/2017   Annual physical exam 04/10/2017   Fatigue 12/16/2016   Chronic back pain 05/30/2016   Abdominal pain 03/12/2016   Anxiety and depression 12/28/2015   History of obstructive sleep apnea 12/15/2015   GERD (gastroesophageal reflux disease) 12/15/2015   Hyperlipidemia 12/15/2015   Pain  medication agreement signed 03/25/2015   CAD in native artery 02/05/2015   Right supraspinatus tenosynovitis 08/17/2014   Crohn's disease involving terminal ileum ([place]) 03/30/2014   FH: colon polyps 03/30/2014   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 female who presents to outpatient physical therapy with a referral for medical diagnosis tear of right gluteus medius tendon, trochanteric bursitis of both hips, enthesopathy of hip region on both sides. This patient's chief complaints consist of right hip pain, stiffness, weakness, abnormal gait, difficulty with balance, and dysfunction s/p OPEN REPAIR R GLUTEUS MEDIUS gluteal repair 12/05/2021, leading to the following functional deficits: difficulty with usual activities and basic mobility including household and community ambulation, transfers, walking her dogs, ADLs, IADLs, going to the beach, stairs, sleeping, carrying, lifting, bending, doing activities that require weight bearing without loss of balance, walking the dogs. 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  Patient denies hx of stroke, seizures, lung problems, diabetes, unexplained  weight loss, unexplained changes in bowel or bladder problems, unexplained stumbling or dropping things, osteoporosis, and spinal surgery.  PRECAUTIONS: Other: can transition to weight bearing as tolerated.  See protocol in chart.  SUBJECTIVE: Patient [facility]. Reports soreness in hip after progress note. Able to walk dogs with husband down the street.  PAIN:  Are you having pain? denies  OBJECTIVE   SELF-REPORTED FUNCTION FOTO score: 47/100 (upper leg questionnaire)   MUSCLE PERFORMANCE (MMT):  *Indicates pain 01/26/22 03/06/22 04/24/22  Joint/Motion R/L R/L R/L  Hip        Flexion (L1, L2) 3+/4+ 5/5 5/5  Extension (knee ext) 4/4+ 4+/4+ 4+/5  Abduction 3*/ 4*/4+ 4/5  Adduction / / /  External rotation / / /  Internal rotation  / / /  Knee        Extension (L3) 5/5 / /  Flexion (S2) 5/5 / /  Ankle/Foot  Dorsiflexion (L4) 5/5 / /  Great toe extension (L5) 4+/4+ / /  Eversion (S1) 5/5 / /  Comments:    FUNCTIONAL/BALANCE EXERCISES 6 Minute Walk Test: 759 feet with SPC. Altered gait pattern with trendelenburg pattern on R hip. No increased soreness.    Single leg stance, firm surface, eyes open: R= 20 seconds with mild trendelenburg, L= > 30 seconds.    TODAY'S TREATMENT:  Therapeutic exercise: to centralize symptoms and improve ROM, strength, muscular endurance, and activity tolerance required for successful completion of functional activities.   - Standing R SLS lifting left hip to stay out of trendelenburg, 3x10 at 10 second holds.   - Sit to stand with staggered stance (R foot 1/2 foot length back) from 18 inch chair, 1x10. Progressed to LLE under airex pad to increase WB onto RLE. 3x10.   - Standing hip hike RLE on 4" step at TM bar: x8. Max TC's on pelvis. Irritates low back    - Standing R hip abduction: RTB, 3x6. Mod TC's at pelvis for upright posture. Decreased ROM noted.   - Resisted side steps with RTB at ankles: 2x20' SBA.     Pt required  multimodal cuing for proper technique and to facilitate improved neuromuscular control, strength, range of motion, and functional ability resulting in improved performance and form.    PATIENT EDUCATION:  Education details:  Exercise purpose/form. Self management techniques. HEP including handout,  Reviewed cancelation/no-show policy with patient and confirmed patient has correct phone number for clinic; patient verbalized understanding (02/02/22). Person educated: Patient Education method: Explanation, demonstration, multimodal cuing.  Education comprehension: verbalized and demonstrated understanding and needs further education     HOME EXERCISE PROGRAM: Access Code: [email or web address] URL: [email or web address] Date: 03/23/2022 Prepared by: Rosita Kea  Exercises - Staggered Sit-to-Stand  - 1 x daily - 3 sets - 10 reps - Supine Bridge with Resistance Band  - 1 x daily - 1 sets - 20 reps - 5 seconds hold - Standing Hip Abduction with Resistance at Ankles and Unilateral Counter Support  - 1 x daily - 3 sets - 10 reps - 5 second hold hold - Standing Hip Flexion with Resistance Loop  - 1 x daily - 3 sets - 10 reps - 5 seconds hold - Single Leg Balance in March Position  - 1 x daily - 2-3 sets - 8-10 reps - 10 seconds hold   ASSESSMENT:   CLINICAL IMPRESSION: Continuing PT POC progressive RLE WB to improve glut med strengthening. Pt very aware and has excellent understanding form/technique of all exercises with little to no need for VC's. Educated on some progressions for HEP if some exercises are becoming too easy so pt can progress in her hip strength to eliminate trendelenburg gait. Pt will continue to benefit from skilled PT for exercise progression to address remaining deficits and accomplish remaining goals.    From eval: Patient is a 56 y.o. female referred to outpatient physical therapy with a medical diagnosis of tear of right gluteus medius tendon, trochanteric bursitis of  both hips, enthesopathy of hip region on both sides who presents with signs and symptoms consistent with right hip pain, stiffness, weakness, dysfunction, imbalance, gait disturbance s/p OPEN REPAIR R GLUTEUS MEDIUS gluteal repair 12/05/2021 and . Patient presents with significant pain, ROM, posture, joint stiffness, tissue integrity, motor control, flexibility, gait, balance, muscle performance (strength/power/endurance) and activity tolerance impairments that are limiting ability to complete her usual activities and basic mobility including household and community ambulation, transfers,  walking her dogs, ADLs, IADLs, going to the beach, stairs, sleeping, carrying, lifting, bending, doing activities that require weight bearing without loss of balance, walking the dogs without difficulty. Patient will benefit from skilled physical therapy intervention to address current body structure impairments and activity limitations to improve function and work towards goals set in current POC in order to return to prior level of function or maximal functional improvement.    OBJECTIVE IMPAIRMENTS Abnormal gait, decreased activity tolerance, decreased balance, decreased coordination, decreased endurance, decreased knowledge of use of DME, decreased mobility, difficulty walking, decreased ROM, decreased strength, impaired perceived functional ability, impaired flexibility, improper body mechanics, postural dysfunction, and pain.    ACTIVITY LIMITATIONS carrying, lifting, bending, sitting, standing, squatting, sleeping, stairs, transfers, bed mobility, dressing, hygiene/grooming, locomotion level, and caring for others   PARTICIPATION LIMITATIONS: meal prep, cleaning, laundry, interpersonal relationship, driving, shopping, community activity, and   basic mobility including household and community ambulation, transfers, walking her dogs, ADLs, IADLs, going to the beach, stairs, sleeping, carrying, lifting, bending, doing  activities that require weight bearing without loss of balance, walking the dogs.   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    REHAB POTENTIAL: Good   CLINICAL DECISION MAKING: Stable/uncomplicated   EVALUATION COMPLEXITY: Low     GOALS: Goals reviewed with patient? No   SHORT TERM GOALS: Target date: 02/09/2022   Patient will be independent with initial home exercise program for self-management of symptoms. Baseline: Initial HEP to be provided at visit 2 as appropriate (01/26/22); participates as able but struggles to get reps/sets in due to fatigue/soreness (03/06/2022); Goal status: partially met     LONG TERM GOALS: Target date: 04/20/2022. Updated to 07/17/2022 for all unmet goals on 04/24/2022.    Patient will be independent with a long-term home exercise program for self-management of symptoms.  Baseline: Initial HEP to be provided at visit 2 as appropriate (01/26/22); participates as able but struggles to get reps/sets in due to fatigue/soreness (03/06/2022); participating well (04/24/2022);  Goal status: In-progress   2.  Patient will demonstrate improved FOTO to equal or greater than 54 by visit #16 to demonstrate improvement in overall condition and self-reported functional ability.  Baseline: 30 (01/26/22); 58 at visit #10 (03/06/2022); 47 at visit #20 (04/24/2022);  Goal status: In-progress   3.  Patient will demonstrate R sided single leg stance of equal or greater than 30 seconds with no trendelenburg compensation to decrease fall risk and improve household  and community ambulation.  Baseline: 0 seconds (01/26/22); 20 seconds with significant trendelenburg, unable to hold without trendelenburg (03/06/2022); 20 seconds with mild trendelenburg, (04/24/2022); Goal status: In-progress   4.  Patient will ambulate equal or greater than 1000 feet with no AD during 6 min walk test to improve community ambulation.  Baseline: not tested (01/26/22); 617 feet with SPC. Altered gait pattern with trendelenburg pattern on R hip. Increased soreness (03/06/2022); 759 feet with SPC. Altered gait pattern with trendelenburg pattern on R hip. No increased soreness (04/24/2022); Goal status: In-progress   5.  Patient will complete community, work and/or recreational activities without limitation due to current condition.  Baseline: difficulty with usual  activities including basic mobility including household and community ambulation, transfers, walking her dogs, ADLs, IADLs, going to the beach, stairs, sleeping, carrying, lifting, bending, doing activities that require weight bearing without loss of balance(01/26/22); reports she still cannot walk the dogs, is able to go up and down a few steps, continues to have difficulty with similar limitations with mild improvement (03/06/2022); has started taking some short walks to end of driveway with dogs and husband to help with dogs as needed, able to take more steps without AD at home (04/24/2022);  Goal status: In-progress   6.  Patient will demonstrate B hip abduction MMT equal or greater than 4+/5 with no increase in pain to improve activity tolerance for walking her dog.  Baseline: R = 3/5 with pain, left not tested due to lack of tolerance for laying on R side (01/26/2022);  R = 4/5 with pain (patient also fatigues by 10 reps and is unable to do more than 2 sets of 10 reps of dynamic hip abduction), L = 4+/5 (03/06/2022);  R = 4/5 without pain but feels tired (04/24/2022);  Goal status: In-progress       PLAN: PT FREQUENCY:  1-2x/week   PT DURATION: 12 weeks   PLANNED INTERVENTIONS: Therapeutic exercises, Therapeutic activity, Neuromuscular re-education, Balance training, Gait training, Patient/Family education, Joint mobilization, Stair training, DME instructions, Aquatic Therapy, Dry Needling, Electrical stimulation, Spinal mobilization, Cryotherapy, Moist heat, Manual therapy, and Re-evaluation   PLAN FOR NEXT SESSION: update HEP as appropriate, progressive strengthening, ROM, gait training, and balance within protocol guidelines. Work towards ability to tolerate R SLS.    Salem Caster. Fairly IV, PT, DPT Physical Therapist- [facility]  05/02/22, 1:55 PM

## 2022-05-04 ENCOUNTER — Encounter: Payer: Managed Care, Other (non HMO) | Admitting: Physical Therapy

## 2022-05-09 ENCOUNTER — Ambulatory Visit: Payer: Managed Care, Other (non HMO) | Admitting: Physical Therapy

## 2022-05-09 ENCOUNTER — Encounter: Payer: Self-pay | Admitting: Physical Therapy

## 2022-05-09 DIAGNOSIS — M25551 Pain in right hip: Secondary | ICD-10-CM | POA: Diagnosis not present

## 2022-05-09 DIAGNOSIS — R262 Difficulty in walking, not elsewhere classified: Secondary | ICD-10-CM

## 2022-05-09 DIAGNOSIS — M6281 Muscle weakness (generalized): Secondary | ICD-10-CM

## 2022-05-09 DIAGNOSIS — R2681 Unsteadiness on feet: Secondary | ICD-10-CM

## 2022-05-09 DIAGNOSIS — M25552 Pain in left hip: Secondary | ICD-10-CM

## 2022-05-09 NOTE — Therapy (Signed)
OUTPATIENT PHYSICAL THERAPY TREATMENT NOTE    Patient Name: Carolyn Esparza MRN: [id number] DOB:23-Aug-1965, 56 y.o., female Today's Date: 05/09/2022  PCP: [name], [name], MD REFERRING PROVIDER: Raelene Bott, PA  END OF SESSION:   PT End of Session - 05/09/22 1450     Visit Number 22    Number of Visits 72    Date for PT Re-Evaluation 07/17/22    Authorization Type [employer] reporting period from 04/24/2022    Authorization Time Period VL: max combined 60 PT/OT per year    Authorization - Visit Number 8    Authorization - Number of Visits 60    Progress Note Due on Visit 20    PT Start Time 1433    PT Stop Time 1513    PT Time Calculation (min) 40 min    Activity Tolerance Patient tolerated treatment well    Behavior During Therapy WFL for tasks assessed/performed             Past Medical History:  Diagnosis Date   Anxiety    ASCUS with positive high risk HPV cervical    CAD (coronary artery disease)    Chicken pox    Chronic pain    neck, back, b/l hips    Chronic pain of right hip 7/74/1423   Complication of anesthesia    DDD (degenerative disc disease), lumbar 01/06/2021   Depression    Facet arthritis of lumbar region 01/06/2021   GERD (gastroesophageal reflux disease)    History of Crohn's disease    Hyperlipidemia    Left ovarian cyst    s/p removal of 1 ovary ? which one removed per pt    Libido, decreased    Skin cancer    reports skin cancer removed from skin in 2016 or 2017   Thyroid nodule    repeat US resolved and Ct neck 05/2019 normal thyroid    Past Surgical History:  Procedure Laterality Date   ABDOMINAL HYSTERECTOMY     APPENDECTOMY  2004   BLADDER SURGERY     BREAST BIOPSY     x 2   COLONOSCOPY     COLONOSCOPY WITH PROPOFOL N/A 10/25/2020   Procedure: COLONOSCOPY WITH PROPOFOL;  Surgeon: Mauri Pole, MD;  Location: [facility];  Service: Endoscopy;  Laterality: N/A;   HEMOSTASIS CLIP PLACEMENT  10/25/2020   Procedure:  HEMOSTASIS CLIP PLACEMENT;  Surgeon: Mauri Pole, MD;  Location: [facility];  Service: Endoscopy;;   OOPHORECTOMY Right    Unilateral Right (per MRI 04/17/2018)   POLYPECTOMY  10/25/2020   Procedure: POLYPECTOMY;  Surgeon: Mauri Pole, MD;  Location: [facility];  Service: Endoscopy;;   SUBMUCOSAL LIFTING INJECTION  10/25/2020   Procedure: SUBMUCOSAL LIFTING INJECTION;  Surgeon: Mauri Pole, MD;  Location: [facility];  Service: Endoscopy;;   WISDOM TOOTH EXTRACTION     Patient Active Problem List   Diagnosis Date Noted   Cervicalgia 07/01/2021   Low back pain 07/01/2021   Herpes infection 07/01/2021   Chronic pain of right hip 04/28/2021   DDD (degenerative disc disease), lumbar 01/06/2021   Facet arthritis of lumbar region 01/06/2021   Trochanteric bursitis of both hips 11/30/2020   Special screening for malignant neoplasms, colon    Polyp of cecum    Pain in both feet 04/12/2020   Burning sensation of feet 04/12/2020   Pain of both hip joints 04/12/2020   Onychomycosis 04/12/2020   Fibrocystic breast disease (FCBD) 04/12/2020   Numbness in both  legs 09/09/2019   Spinal stenosis in cervical region 05/09/2019   Facet arthropathy, cervical 05/09/2019   Chronic pain 11/27/2018   Obesity (BMI 30.0-34.9) 11/27/2018   Sprain and strain of hip and thigh 05/10/2018   Abnormal MRI, cervical spine 03/28/2018   Menopause 11/26/2017   Right upper quadrant pain 11/26/2017   Abnormal Pap smear of cervix 07/27/2017   Vasomotor flushing 07/19/2017   Depression 07/18/2017   Nausea 06/25/2017   Chronic pain of both hips 06/25/2017   OSA (obstructive sleep apnea) 04/18/2017   Annual physical exam 04/10/2017   Fatigue 12/16/2016   Chronic back pain 05/30/2016   Abdominal pain 03/12/2016   Anxiety and depression 12/28/2015   History of obstructive sleep apnea 12/15/2015   GERD (gastroesophageal reflux disease) 12/15/2015   Hyperlipidemia 12/15/2015   Pain  medication agreement signed 03/25/2015   CAD in native artery 02/05/2015   Right supraspinatus tenosynovitis 08/17/2014   Crohn's disease involving terminal ileum ([place]) 03/30/2014   FH: colon polyps 03/30/2014   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 female who presents to outpatient physical therapy with a referral for medical diagnosis tear of right gluteus medius tendon, trochanteric bursitis of both hips, enthesopathy of hip region on both sides. This patient's chief complaints consist of right hip pain, stiffness, weakness, abnormal gait, difficulty with balance, and dysfunction s/p OPEN REPAIR R GLUTEUS MEDIUS gluteal repair 12/05/2021, leading to the following functional deficits: difficulty with usual activities and basic mobility including household and community ambulation, transfers, walking her dogs, ADLs, IADLs, going to the beach, stairs, sleeping, carrying, lifting, bending, doing activities that require weight bearing without loss of balance, walking the dogs. 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  Patient denies hx of stroke, seizures, lung problems, diabetes, unexplained  weight loss, unexplained changes in bowel or bladder problems, unexplained stumbling or dropping things, osteoporosis, and spinal surgery.  PRECAUTIONS: Other: can transition to weight bearing as tolerated.  See protocol in chart.  SUBJECTIVE: Patient arrives with [facility]. She states her right hip is a little sore after she went to the beach this [place].    PAIN:  Are you having pain? 3/10  OBJECTIVE  TODAY'S TREATMENT:  Therapeutic exercise: to centralize symptoms and improve ROM, strength, muscular endurance, and activity tolerance required for successful completion of functional activities.  - Standing R SLS lifting left hip to stay out of trendelenburg, 1x10 at 10 second holds 1x25 seconds (max hold). Marland Kitchen TC/VC to improve staying out of trendelenburg.  - Sit to stand with staggered stance (R foot 1/2 foot length back) from 18 inch chair, 1x10. Progressed to LLE under airex pad to increase WB onto RLE. 2x10.  -  step up to 6 inch step, 2x10 with no UE support on L LE, 1x10 with light contralateral UE support on R side. - ambulation 20 feet with no AD (much improved trendelenburg compared to previously).   - monster walks with YTB around ankles, 1x20 feet forwards and backwards (R hip starting to hurt after).  - seated LAQ with 7.5# AW, 2x10 each side.  - hookling bridge 3x10  Pt required multimodal cuing for proper technique and to facilitate improved neuromuscular control, strength, range of motion, and functional ability resulting in improved performance and form.    PATIENT EDUCATION:  Education details:  Exercise purpose/form. Self management techniques. HEP including handout,  Reviewed cancelation/no-show policy with patient and confirmed patient has correct phone number for clinic; patient verbalized understanding (02/02/22). Person educated: Patient Education method: Explanation, demonstration, multimodal cuing.  Education comprehension:  verbalized and demonstrated understanding and needs further education     HOME EXERCISE PROGRAM: Access Code: [email or web address] URL: [email or web address] Date: 03/23/2022 Prepared by: Rosita Kea  Exercises - Staggered Sit-to-Stand  - 1 x daily - 3 sets - 10 reps - Supine Bridge with Resistance Band  - 1 x daily - 1 sets - 20 reps - 5 seconds hold - Standing Hip Abduction with Resistance at Ankles and Unilateral Counter Support  - 1 x daily - 3 sets - 10 reps - 5 second hold hold - Standing Hip Flexion with Resistance Loop  - 1 x daily - 3 sets - 10 reps - 5 seconds hold - Single Leg Balance in March Position  - 1 x daily - 2-3 sets - 8-10 reps - 10 seconds hold   ASSESSMENT:   CLINICAL IMPRESSION: Patient tolerated treatment with some mild increase in discomfort to encompass pain after progression of exercises that load R glute med. Discontinued direct loading to right lateral hip for remainder of session due to sufficient stress for today's session. Patient is showing significant improvements in right hip lateral stability but continues to be limited in activities that require single leg stance including walking and steps. Patient would benefit from continued management of limiting condition by skilled physical therapist to address remaining impairments and functional limitations to work towards stated goals and return to PLOF or maximal functional independence.   From eval: Patient is a 56 y.o. female referred to outpatient physical therapy with a medical diagnosis of tear of right gluteus medius tendon, trochanteric bursitis of both hips, enthesopathy of hip region on both sides who presents with signs and symptoms consistent with right hip pain, stiffness, weakness, dysfunction, imbalance, gait disturbance s/p OPEN REPAIR R GLUTEUS MEDIUS gluteal repair 12/05/2021 and . Patient presents with significant pain, ROM, posture, joint stiffness, tissue integrity, motor control, flexibility,  gait, balance, muscle performance (strength/power/endurance) and activity tolerance impairments that are limiting ability to complete her usual activities and basic mobility including household and community ambulation, transfers, walking her dogs, ADLs, IADLs, going to the beach, stairs, sleeping, carrying, lifting, bending, doing activities that require weight bearing without loss of balance, walking the dogs without difficulty. Patient will benefit from skilled physical therapy intervention to address current body structure impairments and activity limitations to improve function and work towards goals set in current POC in order to return to prior level of function or maximal functional improvement.    OBJECTIVE IMPAIRMENTS Abnormal gait, decreased activity tolerance, decreased balance, decreased coordination, decreased endurance, decreased knowledge of use of DME, decreased mobility, difficulty walking, decreased ROM, decreased strength, impaired perceived functional ability, impaired  flexibility, improper body mechanics, postural dysfunction, and pain.    ACTIVITY LIMITATIONS carrying, lifting, bending, sitting, standing, squatting, sleeping, stairs, transfers, bed mobility, dressing, hygiene/grooming, locomotion level, and caring for others   PARTICIPATION LIMITATIONS: meal prep, cleaning, laundry, interpersonal relationship, driving, shopping, community activity, and   basic mobility including household and community ambulation, transfers, walking her dogs, ADLs, IADLs, going to the beach, stairs, sleeping, carrying, lifting, bending, doing activities that require weight bearing without loss of balance, walking the dogs.   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    REHAB POTENTIAL: Good   CLINICAL DECISION MAKING: Stable/uncomplicated   EVALUATION COMPLEXITY: Low     GOALS: Goals reviewed with patient? No   SHORT TERM GOALS: Target date: 02/09/2022   Patient will be independent with initial home exercise program for self-management of symptoms. Baseline: Initial HEP to be provided at visit 2 as appropriate (01/26/22); participates as able but struggles to get reps/sets in due to fatigue/soreness (03/06/2022); Goal status: partially met     LONG TERM GOALS: Target date: 04/20/2022. Updated to 07/17/2022 for all unmet goals on 04/24/2022.    Patient will be independent with a long-term home exercise program for self-management of symptoms.  Baseline: Initial HEP to be provided at visit 2 as appropriate (01/26/22); participates as able but struggles to get reps/sets in due to fatigue/soreness (03/06/2022); participating well (04/24/2022);  Goal status: In-progress   2.  Patient will demonstrate improved FOTO to equal or greater than 54 by visit #16 to demonstrate improvement in overall condition and self-reported functional ability.  Baseline: 30 (01/26/22); 58 at visit #10 (03/06/2022); 47 at visit #20 (04/24/2022);  Goal status: In-progress   3.  Patient will demonstrate R sided single leg stance of equal or greater than 30 seconds with no trendelenburg compensation to decrease fall risk and improve household and community ambulation.  Baseline: 0 seconds (01/26/22); 20 seconds with significant trendelenburg, unable to hold without trendelenburg (03/06/2022); 20 seconds with mild trendelenburg, (04/24/2022); Goal status: In-progress   4.  Patient will ambulate equal or greater than 1000 feet with no AD during 6 min walk test to improve community ambulation.   Baseline: not tested (01/26/22); 617 feet with SPC. Altered gait pattern with trendelenburg pattern on R hip. Increased soreness (03/06/2022); 759 feet with SPC. Altered gait pattern with trendelenburg pattern on R hip. No increased soreness (04/24/2022); Goal status: In-progress   5.  Patient will complete community, work and/or recreational activities without limitation due to current condition.  Baseline: difficulty with usual activities including basic mobility including household and community ambulation, transfers, walking her dogs, ADLs, IADLs, going to the beach, stairs, sleeping, carrying, lifting, bending, doing activities that require weight bearing without loss of balance(01/26/22); reports she still cannot walk the dogs, is able to go up and down a few steps, continues to have difficulty with similar limitations with mild improvement (03/06/2022); has started taking some short walks to end of driveway with dogs and husband to help with dogs as needed, able to take more steps without AD at home (04/24/2022);  Goal status: In-progress   6.  Patient will demonstrate B hip  abduction MMT equal or greater than 4+/5 with no increase in pain to improve activity tolerance for walking her dog.  Baseline: R = 3/5 with pain, left not tested due to lack of tolerance for laying on R side (01/26/2022);  R = 4/5 with pain (patient also fatigues by 10 reps and is unable to do more than 2 sets of 10 reps of dynamic hip abduction), L = 4+/5 (03/06/2022);  R = 4/5 without pain but feels tired (04/24/2022);  Goal status: In-progress       PLAN: PT FREQUENCY: 1-2x/week   PT DURATION: 12 weeks   PLANNED INTERVENTIONS: Therapeutic exercises, Therapeutic activity, Neuromuscular re-education, Balance training, Gait training, Patient/Family education, Joint mobilization, Stair training, DME instructions, Aquatic Therapy, Dry Needling, Electrical stimulation, Spinal mobilization, Cryotherapy, Moist heat, Manual therapy,  and Re-evaluation   PLAN FOR NEXT SESSION: update HEP as appropriate, progressive strengthening, ROM, gait training, and balance within protocol guidelines. Work towards ability to tolerate R SLS.    Everlean Alstrom. Graylon Good, PT, DPT 05/09/22, 3:13 PM  [facility] [address] [place], [place] [address] P: [phone] I F: [phone]

## 2022-05-11 ENCOUNTER — Encounter: Payer: Managed Care, Other (non HMO) | Admitting: Physical Therapy

## 2022-05-16 ENCOUNTER — Encounter: Payer: Self-pay | Admitting: Physical Therapy

## 2022-05-16 ENCOUNTER — Ambulatory Visit: Payer: Managed Care, Other (non HMO) | Admitting: Physical Therapy

## 2022-05-16 DIAGNOSIS — M25551 Pain in right hip: Secondary | ICD-10-CM

## 2022-05-16 DIAGNOSIS — R2681 Unsteadiness on feet: Secondary | ICD-10-CM

## 2022-05-16 DIAGNOSIS — M6281 Muscle weakness (generalized): Secondary | ICD-10-CM

## 2022-05-16 DIAGNOSIS — R262 Difficulty in walking, not elsewhere classified: Secondary | ICD-10-CM

## 2022-05-16 DIAGNOSIS — M25552 Pain in left hip: Secondary | ICD-10-CM

## 2022-05-16 NOTE — Therapy (Signed)
OUTPATIENT PHYSICAL THERAPY TREATMENT NOTE    Patient Name: Carolyn Esparza MRN: [id number] DOB:1966-07-20, 56 y.o., female Today's Date: 05/16/2022  PCP: [name], [name], MD REFERRING PROVIDER: Raelene Bott, PA  END OF SESSION:   PT End of Session - 05/16/22 1356     Visit Number 23    Number of Visits 63    Date for PT Re-Evaluation 07/17/22    Authorization Type [employer] reporting period from 04/24/2022    Authorization Time Period VL: max combined 60 PT/OT per year    Authorization - Visit Number 74    Authorization - Number of Visits 60    Progress Note Due on Visit 20    PT Start Time 1347    PT Stop Time 1427    PT Time Calculation (min) 40 min    Activity Tolerance Patient tolerated treatment well    Behavior During Therapy WFL for tasks assessed/performed              Past Medical History:  Diagnosis Date   Anxiety    ASCUS with positive high risk HPV cervical    CAD (coronary artery disease)    Chicken pox    Chronic pain    neck, back, b/l hips    Chronic pain of right hip 09/19/2540   Complication of anesthesia    DDD (degenerative disc disease), lumbar 01/06/2021   Depression    Facet arthritis of lumbar region 01/06/2021   GERD (gastroesophageal reflux disease)    History of Crohn's disease    Hyperlipidemia    Left ovarian cyst    s/p removal of 1 ovary ? which one removed per pt    Libido, decreased    Skin cancer    reports skin cancer removed from skin in 2016 or 2017   Thyroid nodule    repeat US resolved and Ct neck 05/2019 normal thyroid    Past Surgical History:  Procedure Laterality Date   ABDOMINAL HYSTERECTOMY     APPENDECTOMY  2004   BLADDER SURGERY     BREAST BIOPSY     x 2   COLONOSCOPY     COLONOSCOPY WITH PROPOFOL N/A 10/25/2020   Procedure: COLONOSCOPY WITH PROPOFOL;  Surgeon: Mauri Pole, MD;  Location: [facility];  Service: Endoscopy;  Laterality: N/A;   HEMOSTASIS CLIP PLACEMENT  10/25/2020   Procedure:  HEMOSTASIS CLIP PLACEMENT;  Surgeon: Mauri Pole, MD;  Location: [facility];  Service: Endoscopy;;   OOPHORECTOMY Right    Unilateral Right (per MRI 04/17/2018)   POLYPECTOMY  10/25/2020   Procedure: POLYPECTOMY;  Surgeon: Mauri Pole, MD;  Location: [facility];  Service: Endoscopy;;   SUBMUCOSAL LIFTING INJECTION  10/25/2020   Procedure: SUBMUCOSAL LIFTING INJECTION;  Surgeon: Mauri Pole, MD;  Location: [facility];  Service: Endoscopy;;   WISDOM TOOTH EXTRACTION     Patient Active Problem List   Diagnosis Date Noted   Cervicalgia 07/01/2021   Low back pain 07/01/2021   Herpes infection 07/01/2021   Chronic pain of right hip 04/28/2021   DDD (degenerative disc disease), lumbar 01/06/2021   Facet arthritis of lumbar region 01/06/2021   Trochanteric bursitis of both hips 11/30/2020   Special screening for malignant neoplasms, colon    Polyp of cecum    Pain in both feet 04/12/2020   Burning sensation of feet 04/12/2020   Pain of both hip joints 04/12/2020   Onychomycosis 04/12/2020   Fibrocystic breast disease (FCBD) 04/12/2020   Numbness in  both legs 09/09/2019   Spinal stenosis in cervical region 05/09/2019   Facet arthropathy, cervical 05/09/2019   Chronic pain 11/27/2018   Obesity (BMI 30.0-34.9) 11/27/2018   Sprain and strain of hip and thigh 05/10/2018   Abnormal MRI, cervical spine 03/28/2018   Menopause 11/26/2017   Right upper quadrant pain 11/26/2017   Abnormal Pap smear of cervix 07/27/2017   Vasomotor flushing 07/19/2017   Depression 07/18/2017   Nausea 06/25/2017   Chronic pain of both hips 06/25/2017   OSA (obstructive sleep apnea) 04/18/2017   Annual physical exam 04/10/2017   Fatigue 12/16/2016   Chronic back pain 05/30/2016   Abdominal pain 03/12/2016   Anxiety and depression 12/28/2015   History of obstructive sleep apnea 12/15/2015   GERD (gastroesophageal reflux disease) 12/15/2015   Hyperlipidemia 12/15/2015   Pain  medication agreement signed 03/25/2015   CAD in native artery 02/05/2015   Right supraspinatus tenosynovitis 08/17/2014   Crohn's disease involving terminal ileum ([place]) 03/30/2014   FH: colon polyps 03/30/2014   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 female who presents to outpatient physical therapy with a referral for medical diagnosis tear of right gluteus medius tendon, trochanteric bursitis of both hips, enthesopathy of hip region on both sides. This patient's chief complaints consist of right hip pain, stiffness, weakness, abnormal gait, difficulty with balance, and dysfunction s/p OPEN REPAIR R GLUTEUS MEDIUS gluteal repair 12/05/2021, leading to the following functional deficits: difficulty with usual activities and basic mobility including household and community ambulation, transfers, walking her dogs, ADLs, IADLs, going to the beach, stairs, sleeping, carrying, lifting, bending, doing activities that require weight bearing without loss of balance, walking the dogs. 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  Patient denies hx of stroke, seizures, lung problems, diabetes, unexplained  weight loss, unexplained changes in bowel or bladder problems, unexplained stumbling or dropping things, osteoporosis, and spinal surgery.  PRECAUTIONS: Other: can transition to weight bearing as tolerated.  See protocol in chart.  SUBJECTIVE: Patient reports she has not had a very good week in general. She states she was sore after last PT session and was unable to do her HEP for a couple days. She also may have a reaction from some artificial sweetener that seemed make her hurt around her lower abdomen. She states this is not official diagnosed, but it seems to happen reliably with one of the artificial sweeteners and this time it was in the propel. She is feeling better today. Her back has also been hurting her.    PAIN:  Are you having pain? NPRS 3/10 in right hip, 5/10 in back.   OBJECTIVE  TODAY'S TREATMENT:  Therapeutic exercise: to centralize symptoms and improve ROM, strength, muscular endurance, and activity tolerance required  for successful completion of functional activities.  - NuStep level 4 using bilateral upper and lower extremities. Seat/handle setting 6/9. For improved extremity mobility, muscular endurance, and activity tolerance; and to induce the analgesic effect of aerobic exercise, stimulate improved joint nutrition, and prepare body structures and systems for following interventions. x 6  minutes. Average SPM = 66. Moist heat applied to low back during nustep, skin checked after.   Circuit:  - Standing R SLS lifting left hip to stay out of trendelenburg, 2x10 at 5 second. TC/VC to improve staying out of trendelenburg.  - Sit to stand with staggered stance, L LE on flat side of half spike ball positioned anterior to R LE WB onto RLE. 2x10.  - R LE lateral push deadmill with L LE on static side of treadmill with B UE support, 3x30 seconds. Attempted reverse side but bothered R hip too much.  - step up to 6 inch step, with contralateral LE tap at next or 2 steps up 2x10 on R  side with light L UE support.  - lateral walking, 2x20 feet each direction.  - lateral step over 7 inch cone, 1x4 each direction with intermittent UE support.  - Education on HEP including handout   Pt required multimodal cuing for proper technique and to facilitate improved neuromuscular control, strength, range of motion, and functional ability resulting in improved performance and form.    PATIENT EDUCATION:  Education details:  Exercise purpose/form. Self management techniques.  Reviewed cancelation/no-show policy with patient and confirmed patient has correct phone number for clinic; patient verbalized understanding (02/02/22). Person educated: Patient Education method: Explanation, demonstration, multimodal cuing.  Education comprehension: verbalized and demonstrated understanding and needs further education     HOME EXERCISE PROGRAM: Access Code: [email or web address] URL: [email or web address] Date: 05/16/2022 Prepared by: Rosita Kea  Exercises - Staggered Sit-to-Stand  - 1 x daily - 3 sets - 10 reps - Supine Bridge with Resistance Band  - 1 x daily - 1 sets - 20 reps - 5 seconds hold - Standing Hip Abduction with Resistance at Ankles and Unilateral Counter Support  - 1 x daily - 3 sets - 10 reps - 5 second hold hold - Standing Hip Flexion with Resistance Loop  - 1 x daily - 3 sets - 10 reps - 5 seconds hold - Single Leg Balance in March Position  - 1 x daily - 2-3 sets - 8-10 reps - 10 seconds hold - Sidelying Hip Abduction  - 1 x daily - 3 sets - 10 reps - Sideways Walking  - 1 x daily - 1-3 sets - 10 reps - Side Step Overs with Cones and Unilateral Counter Support  - 1 x daily - 1-3 sets - 10 reps   ASSESSMENT:   CLINICAL IMPRESSION: Patient returns with report of increased pain after last PT session to the point she was unable to do her HEP for a couple of days and generalized difficulty with pain and feeling bad after drinking propel. Continued with exercises for  right lateral hip strengthening as tolerated this session with modifications as needed due to pain/discomfort. Patient reports being tired by end of session but with no increased pain. Patient continues to ambulate with trendelenburg gait and fatigues quickly with loading to R lateral hip. Patient would benefit from continued management of limiting condition by skilled physical therapist to address remaining impairments and functional limitations to work towards stated goals and return to PLOF or maximal functional independence.   From eva 01/26/2022:  Patient is a 56 y.o. female referred to outpatient physical therapy with a medical diagnosis of tear of right gluteus medius tendon, trochanteric bursitis of both hips, enthesopathy of hip region on both sides who presents with signs and symptoms consistent with right hip pain, stiffness, weakness, dysfunction, imbalance, gait disturbance s/p OPEN REPAIR R GLUTEUS MEDIUS gluteal repair 12/05/2021 and . Patient presents with significant pain, ROM, posture, joint stiffness, tissue integrity, motor control, flexibility, gait, balance, muscle performance (strength/power/endurance) and activity tolerance impairments that are limiting ability to complete her usual activities and basic mobility including household and community ambulation, transfers, walking her dogs, ADLs, IADLs, going to the beach, stairs, sleeping, carrying, lifting, bending, doing activities that require weight bearing without loss of balance, walking the dogs without difficulty. Patient will benefit from skilled physical therapy intervention to address current body structure impairments and activity limitations to improve function and work towards goals set in current POC in order to return to prior level of function or maximal functional improvement.    OBJECTIVE IMPAIRMENTS Abnormal gait, decreased activity tolerance, decreased balance, decreased coordination, decreased endurance, decreased  knowledge of use of DME, decreased mobility, difficulty walking, decreased ROM, decreased strength, impaired perceived functional ability, impaired flexibility, improper body mechanics, postural dysfunction, and pain.    ACTIVITY LIMITATIONS carrying, lifting, bending, sitting, standing, squatting, sleeping, stairs, transfers, bed mobility, dressing, hygiene/grooming, locomotion level, and caring for others   PARTICIPATION LIMITATIONS: meal prep, cleaning, laundry, interpersonal relationship, driving, shopping, community activity, and   basic mobility including household and community ambulation, transfers, walking her dogs, ADLs, IADLs, going to the beach, stairs, sleeping, carrying, lifting, bending, doing activities that require weight bearing without loss of balance, walking the dogs.   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    REHAB POTENTIAL: Good   CLINICAL DECISION MAKING: Stable/uncomplicated   EVALUATION COMPLEXITY: Low     GOALS: Goals reviewed with patient? No   SHORT TERM GOALS: Target date: 02/09/2022   Patient will be independent with initial home exercise program for self-management of symptoms. Baseline: Initial HEP to be provided at visit 2 as appropriate (01/26/22); participates as able but struggles to get reps/sets in due to fatigue/soreness (03/06/2022); Goal status: partially met     LONG TERM GOALS: Target date: 04/20/2022. Updated to 07/17/2022 for all unmet goals on 04/24/2022.    Patient will be independent with a long-term home exercise  program for self-management of symptoms.  Baseline: Initial HEP to be provided at visit 2 as appropriate (01/26/22); participates as able but struggles to get reps/sets in due to fatigue/soreness (03/06/2022); participating well (04/24/2022);  Goal status: In-progress   2.  Patient will demonstrate improved FOTO to equal or greater than 54 by visit #16 to demonstrate improvement in overall condition and self-reported functional ability.  Baseline: 30 (01/26/22); 58 at visit #10 (03/06/2022); 47 at visit #20 (04/24/2022);  Goal status: In-progress   3.  Patient will demonstrate R sided single leg stance of equal or greater than 30 seconds with no trendelenburg compensation to decrease fall risk and improve household and community ambulation.  Baseline: 0 seconds (01/26/22); 20 seconds with significant trendelenburg, unable  to hold without trendelenburg (03/06/2022); 20 seconds with mild trendelenburg, (04/24/2022); Goal status: In-progress   4.  Patient will ambulate equal or greater than 1000 feet with no AD during 6 min walk test to improve community ambulation.  Baseline: not tested (01/26/22); 617 feet with SPC. Altered gait pattern with trendelenburg pattern on R hip. Increased soreness (03/06/2022); 759 feet with SPC. Altered gait pattern with trendelenburg pattern on R hip. No increased soreness (04/24/2022); Goal status: In-progress   5.  Patient will complete community, work and/or recreational activities without limitation due to current condition.  Baseline: difficulty with usual activities including basic mobility including household and community ambulation, transfers, walking her dogs, ADLs, IADLs, going to the beach, stairs, sleeping, carrying, lifting, bending, doing activities that require weight bearing without loss of balance(01/26/22); reports she still cannot walk the dogs, is able to go up and down a few steps, continues to have difficulty with similar limitations with mild improvement  (03/06/2022); has started taking some short walks to end of driveway with dogs and husband to help with dogs as needed, able to take more steps without AD at home (04/24/2022);  Goal status: In-progress   6.  Patient will demonstrate B hip abduction MMT equal or greater than 4+/5 with no increase in pain to improve activity tolerance for walking her dog.  Baseline: R = 3/5 with pain, left not tested due to lack of tolerance for laying on R side (01/26/2022);  R = 4/5 with pain (patient also fatigues by 10 reps and is unable to do more than 2 sets of 10 reps of dynamic hip abduction), L = 4+/5 (03/06/2022);  R = 4/5 without pain but feels tired (04/24/2022);  Goal status: In-progress       PLAN: PT FREQUENCY: 1-2x/week   PT DURATION: 12 weeks   PLANNED INTERVENTIONS: Therapeutic exercises, Therapeutic activity, Neuromuscular re-education, Balance training, Gait training, Patient/Family education, Joint mobilization, Stair training, DME instructions, Aquatic Therapy, Dry Needling, Electrical stimulation, Spinal mobilization, Cryotherapy, Moist heat, Manual therapy, and Re-evaluation   PLAN FOR NEXT SESSION: update HEP as appropriate, progressive strengthening, ROM, gait training, and balance within protocol guidelines. Work towards ability to tolerate R SLS.    Everlean Alstrom. Graylon Good, PT, DPT 05/16/22, 2:32 PM  [facility] [address] [place], [place] [address] P: [phone] I F: [phone]

## 2022-05-18 ENCOUNTER — Encounter: Payer: Managed Care, Other (non HMO) | Admitting: Physical Therapy

## 2022-05-23 ENCOUNTER — Ambulatory Visit: Payer: Managed Care, Other (non HMO) | Admitting: Physical Therapy

## 2022-05-23 ENCOUNTER — Encounter: Payer: Self-pay | Admitting: Physical Therapy

## 2022-05-23 DIAGNOSIS — M6281 Muscle weakness (generalized): Secondary | ICD-10-CM

## 2022-05-23 DIAGNOSIS — M25551 Pain in right hip: Secondary | ICD-10-CM

## 2022-05-23 DIAGNOSIS — R2681 Unsteadiness on feet: Secondary | ICD-10-CM

## 2022-05-23 DIAGNOSIS — R262 Difficulty in walking, not elsewhere classified: Secondary | ICD-10-CM

## 2022-05-23 DIAGNOSIS — M25552 Pain in left hip: Secondary | ICD-10-CM

## 2022-05-23 NOTE — Therapy (Signed)
OUTPATIENT PHYSICAL THERAPY TREATMENT NOTE    Patient Name: Carolyn Esparza MRN: [id number] DOB:1966/06/25, 56 y.o., female Today's Date: 05/23/2022  PCP: [name], [name], MD REFERRING PROVIDER: Raelene Bott, PA  END OF SESSION:   PT End of Session - 05/23/22 1354     Visit Number 24    Number of Visits 75    Date for PT Re-Evaluation 07/17/22    Authorization Type [employer] reporting period from 04/24/2022    Authorization Time Period VL: max combined 60 PT/OT per year    Authorization - Visit Number 67    Authorization - Number of Visits 60    Progress Note Due on Visit 72    PT Start Time 7322    PT Stop Time 1429    PT Time Calculation (min) 40 min    Activity Tolerance Patient tolerated treatment well    Behavior During Therapy WFL for tasks assessed/performed               Past Medical History:  Diagnosis Date   Anxiety    ASCUS with positive high risk HPV cervical    CAD (coronary artery disease)    Chicken pox    Chronic pain    neck, back, b/l hips    Chronic pain of right hip 0/25/4270   Complication of anesthesia    DDD (degenerative disc disease), lumbar 01/06/2021   Depression    Facet arthritis of lumbar region 01/06/2021   GERD (gastroesophageal reflux disease)    History of Crohn's disease    Hyperlipidemia    Left ovarian cyst    s/p removal of 1 ovary ? which one removed per pt    Libido, decreased    Skin cancer    reports skin cancer removed from skin in 2016 or 2017   Thyroid nodule    repeat US resolved and Ct neck 05/2019 normal thyroid    Past Surgical History:  Procedure Laterality Date   ABDOMINAL HYSTERECTOMY     APPENDECTOMY  2004   BLADDER SURGERY     BREAST BIOPSY     x 2   COLONOSCOPY     COLONOSCOPY WITH PROPOFOL N/A 10/25/2020   Procedure: COLONOSCOPY WITH PROPOFOL;  Surgeon: Mauri Pole, MD;  Location: [facility];  Service: Endoscopy;  Laterality: N/A;   HEMOSTASIS CLIP PLACEMENT  10/25/2020    Procedure: HEMOSTASIS CLIP PLACEMENT;  Surgeon: Mauri Pole, MD;  Location: [facility];  Service: Endoscopy;;   OOPHORECTOMY Right    Unilateral Right (per MRI 04/17/2018)   POLYPECTOMY  10/25/2020   Procedure: POLYPECTOMY;  Surgeon: Mauri Pole, MD;  Location: [facility];  Service: Endoscopy;;   SUBMUCOSAL LIFTING INJECTION  10/25/2020   Procedure: SUBMUCOSAL LIFTING INJECTION;  Surgeon: Mauri Pole, MD;  Location: [facility];  Service: Endoscopy;;   WISDOM TOOTH EXTRACTION     Patient Active Problem List   Diagnosis Date Noted   Cervicalgia 07/01/2021   Low back pain 07/01/2021   Herpes infection 07/01/2021   Chronic pain of right hip 04/28/2021   DDD (degenerative disc disease), lumbar 01/06/2021   Facet arthritis of lumbar region 01/06/2021   Trochanteric bursitis of both hips 11/30/2020   Special screening for malignant neoplasms, colon    Polyp of cecum    Pain in both feet 04/12/2020   Burning sensation of feet 04/12/2020   Pain of both hip joints 04/12/2020   Onychomycosis 04/12/2020   Fibrocystic breast disease (FCBD) 04/12/2020   Numbness  in both legs 09/09/2019   Spinal stenosis in cervical region 05/09/2019   Facet arthropathy, cervical 05/09/2019   Chronic pain 11/27/2018   Obesity (BMI 30.0-34.9) 11/27/2018   Sprain and strain of hip and thigh 05/10/2018   Abnormal MRI, cervical spine 03/28/2018   Menopause 11/26/2017   Right upper quadrant pain 11/26/2017   Abnormal Pap smear of cervix 07/27/2017   Vasomotor flushing 07/19/2017   Depression 07/18/2017   Nausea 06/25/2017   Chronic pain of both hips 06/25/2017   OSA (obstructive sleep apnea) 04/18/2017   Annual physical exam 04/10/2017   Fatigue 12/16/2016   Chronic back pain 05/30/2016   Abdominal pain 03/12/2016   Anxiety and depression 12/28/2015   History of obstructive sleep apnea 12/15/2015   GERD (gastroesophageal reflux disease) 12/15/2015   Hyperlipidemia 12/15/2015    Pain medication agreement signed 03/25/2015   CAD in native artery 02/05/2015   Right supraspinatus tenosynovitis 08/17/2014   Crohn's disease involving terminal ileum ([place]) 03/30/2014   FH: colon polyps 03/30/2014   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 female who presents to outpatient physical therapy with a referral for medical diagnosis tear of right gluteus medius tendon, trochanteric bursitis of both hips, enthesopathy of hip region on both sides. This patient's chief complaints consist of right hip pain, stiffness, weakness, abnormal gait, difficulty with balance, and dysfunction s/p OPEN REPAIR R GLUTEUS MEDIUS gluteal repair 12/05/2021, leading to the following functional deficits: difficulty with usual activities and basic mobility including household and community ambulation, transfers, walking her dogs, ADLs, IADLs, going to the beach, stairs, sleeping, carrying, lifting, bending, doing activities that require weight bearing without loss of balance, walking the dogs. 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  Patient denies hx of stroke, seizures, lung problems, diabetes,  unexplained weight loss, unexplained changes in bowel or bladder problems, unexplained stumbling or dropping things, osteoporosis, and spinal surgery.  PRECAUTIONS: Other: can transition to weight bearing as tolerated.  See protocol in chart.  SUBJECTIVE: Patient arrives without SPC. She states she has not been using it for a couple of days. She feels fine when she goes short distances and it aches when she goes farther but then it feels better later. She took her dogs for a walk without the cane. She reports she recovered after last PT session okay. She walked around [place] two days since last PT session. She reports she is ready to discharge from PT when she can walk long distances without the cane. She is unsure how to define long distances. She is thinking a mile.    PAIN:  Are you having pain? NPRS 0/10.  OBJECTIVE  TODAY'S TREATMENT:  Therapeutic exercise: to centralize symptoms and improve ROM, strength, muscular endurance, and activity  tolerance required for successful completion of functional activities.  - NuStep level 5 using bilateral upper and lower extremities. Seat/handle setting 6/9. For improved extremity mobility, muscular endurance, and activity tolerance; and to induce the analgesic effect of aerobic exercise, stimulate improved joint nutrition, and prepare body structures and systems for following interventions. x 6  minutes. Average SPM = 66.  - step up to 6 inch step, with contralateral LE tap at next or 2 steps up 2x10 on R side with light intermittent UE support.  - SLS on airex pad, 5x15 seconds each side. UE touchdown support as needed.  - lateral walking with YTB around ankles, 1x15 feet each direction.  - standing hip abduction, 3x10 each side AROM - Education on HEP including handout   Pt required multimodal cuing for proper technique and to facilitate improved neuromuscular control, strength, range of motion, and functional ability resulting in improved performance  and form.    PATIENT EDUCATION:  Education details:  Exercise purpose/form. Self management techniques.  Reviewed cancelation/no-show policy with patient and confirmed patient has correct phone number for clinic; patient verbalized understanding (02/02/22). Person educated: Patient Education method: Explanation, demonstration, multimodal cuing.  Education comprehension: verbalized and demonstrated understanding and needs further education     HOME EXERCISE PROGRAM: Access Code: [email or web address] URL: [email or web address] Date: 05/23/2022 Prepared by: Rosita Kea  Exercises - Staggered Sit-to-Stand  - 1 x daily - 3 sets - 10 reps - Supine Bridge with Resistance Band  - 1 x daily - 1 sets - 20 reps - 5 seconds hold - Standing Hip Abduction with Resistance at Ankles and Unilateral Counter Support  - 1 x daily - 3 sets - 10 reps - 5 second hold hold - Sidelying Hip Abduction  - 1 x daily - 3 sets - 10 reps - Side Step Overs with Cones and Unilateral Counter Support  - 1 x daily - 1-3 sets - 10 reps - Single Leg Stance  - 1 x daily - 3 sets - 15-30 secoonds hold - Runner's Step Up/Down  - 3 x weekly - 2-3 sets - 10 reps - Side Stepping with Resistance at Ankles  - 3 x weekly - 2 sets - 10-20 feet   ASSESSMENT:   CLINICAL IMPRESSION: Patient arrives with significantly improved gait pattern and no AD. She was able to tolerate further progressions of R hip strengthening and balance exercises but still has significant limitations in R Lateral hip strength and endurance that negatively affects her abiltiy to complete desired functional activities. Updated HEP to match improved activity tolerance and continue addressing deficits. Plan to continue with PT with gradual progressions to continue working towards full recovery. Patient would benefit from continued management of limiting condition by skilled physical therapist to address remaining impairments and functional limitations to work  towards stated goals and return to PLOF or maximal functional independence.    From eva 01/26/2022:  Patient is a 56 y.o. female referred to outpatient physical therapy with a medical diagnosis of tear of right gluteus medius tendon, trochanteric bursitis of both hips, enthesopathy of hip region on both sides who presents with signs and symptoms consistent with right hip pain, stiffness, weakness, dysfunction, imbalance, gait disturbance s/p OPEN REPAIR R GLUTEUS MEDIUS gluteal repair 12/05/2021 and . Patient presents with significant pain, ROM, posture, joint stiffness, tissue integrity, motor control, flexibility, gait, balance, muscle performance (strength/power/endurance) and activity tolerance impairments that are limiting ability to complete her usual activities and basic mobility including household and community  ambulation, transfers, walking her dogs, ADLs, IADLs, going to the beach, stairs, sleeping, carrying, lifting, bending, doing activities that require weight bearing without loss of balance, walking the dogs without difficulty. Patient will benefit from skilled physical therapy intervention to address current body structure impairments and activity limitations to improve function and work towards goals set in current POC in order to return to prior level of function or maximal functional improvement.    OBJECTIVE IMPAIRMENTS Abnormal gait, decreased activity tolerance, decreased balance, decreased coordination, decreased endurance, decreased knowledge of use of DME, decreased mobility, difficulty walking, decreased ROM, decreased strength, impaired perceived functional ability, impaired flexibility, improper body mechanics, postural dysfunction, and pain.    ACTIVITY LIMITATIONS carrying, lifting, bending, sitting, standing, squatting, sleeping, stairs, transfers, bed mobility, dressing, hygiene/grooming, locomotion level, and caring for others   PARTICIPATION LIMITATIONS: meal prep, cleaning,  laundry, interpersonal relationship, driving, shopping, community activity, and   basic mobility including household and community ambulation, transfers, walking her dogs, ADLs, IADLs, going to the beach, stairs, sleeping, carrying, lifting, bending, doing activities that require weight bearing without loss of balance, walking the dogs.   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    REHAB POTENTIAL: Good   CLINICAL DECISION MAKING: Stable/uncomplicated   EVALUATION COMPLEXITY: Low     GOALS: Goals reviewed with patient? No   SHORT TERM GOALS: Target date: 02/09/2022   Patient will be independent with initial home exercise program for self-management of symptoms. Baseline: Initial HEP to be provided at visit 2 as appropriate (01/26/22); participates as able but struggles to get reps/sets in due to fatigue/soreness (03/06/2022); Goal status: partially met     LONG TERM GOALS: Target date: 04/20/2022. Updated to 07/17/2022 for all unmet goals on 04/24/2022.    Patient will be independent with a long-term home exercise program for self-management of symptoms.  Baseline: Initial HEP to be provided at visit 2 as appropriate (01/26/22); participates as able but struggles to get reps/sets in due to fatigue/soreness (03/06/2022); participating well (04/24/2022);  Goal status: In-progress   2.  Patient will demonstrate improved FOTO to equal or greater than 54 by visit #16 to demonstrate improvement in overall condition and self-reported functional ability.  Baseline: 30 (01/26/22);  58 at visit #10 (03/06/2022); 47 at visit #20 (04/24/2022);  Goal status: In-progress   3.  Patient will demonstrate R sided single leg stance of equal or greater than 30 seconds with no trendelenburg compensation to decrease fall risk and improve household and community ambulation.  Baseline: 0 seconds (01/26/22); 20 seconds with significant trendelenburg, unable to hold without trendelenburg (03/06/2022); 20 seconds with mild trendelenburg, (04/24/2022); Goal status: In-progress   4.  Patient will ambulate equal or greater than 1000 feet with no AD during 6 min walk test to improve community ambulation.  Baseline: not tested (01/26/22); 617 feet with SPC. Altered gait pattern with trendelenburg pattern on R hip. Increased soreness (03/06/2022); 759 feet with SPC. Altered gait pattern with trendelenburg pattern on R hip. No increased soreness (04/24/2022); Goal status: In-progress   5.  Patient will complete community, work and/or recreational activities without limitation due to current condition.  Baseline: difficulty  with usual activities including basic mobility including household and community ambulation, transfers, walking her dogs, ADLs, IADLs, going to the beach, stairs, sleeping, carrying, lifting, bending, doing activities that require weight bearing without loss of balance(01/26/22); reports she still cannot walk the dogs, is able to go up and down a few steps, continues to have difficulty with similar limitations with mild improvement (03/06/2022); has started taking some short walks to end of driveway with dogs and husband to help with dogs as needed, able to take more steps without AD at home (04/24/2022);  Goal status: In-progress   6.  Patient will demonstrate B hip abduction MMT equal or greater than 4+/5 with no increase in pain to improve activity tolerance for walking her dog.  Baseline: R = 3/5 with pain, left not tested due to lack of tolerance for laying on R side (01/26/2022);  R = 4/5  with pain (patient also fatigues by 10 reps and is unable to do more than 2 sets of 10 reps of dynamic hip abduction), L = 4+/5 (03/06/2022);  R = 4/5 without pain but feels tired (04/24/2022);  Goal status: In-progress       PLAN: PT FREQUENCY: 1-2x/week   PT DURATION: 12 weeks   PLANNED INTERVENTIONS: Therapeutic exercises, Therapeutic activity, Neuromuscular re-education, Balance training, Gait training, Patient/Family education, Joint mobilization, Stair training, DME instructions, Aquatic Therapy, Dry Needling, Electrical stimulation, Spinal mobilization, Cryotherapy, Moist heat, Manual therapy, and Re-evaluation   PLAN FOR NEXT SESSION: update HEP as appropriate, progressive strengthening, ROM, gait training, and balance within protocol guidelines. Work towards ability to tolerate R SLS.    Everlean Alstrom. Graylon Good, PT, DPT 05/23/22, 2:31 PM  [facility] [address] [place], [place] [address] P: [phone] I F: [phone]

## 2022-05-25 ENCOUNTER — Encounter: Payer: Managed Care, Other (non HMO) | Admitting: Physical Therapy

## 2022-05-30 ENCOUNTER — Ambulatory Visit: Payer: Managed Care, Other (non HMO) | Admitting: Physical Therapy

## 2022-05-30 ENCOUNTER — Encounter: Payer: Self-pay | Admitting: Physical Therapy

## 2022-05-30 DIAGNOSIS — R262 Difficulty in walking, not elsewhere classified: Secondary | ICD-10-CM

## 2022-05-30 DIAGNOSIS — M25552 Pain in left hip: Secondary | ICD-10-CM

## 2022-05-30 DIAGNOSIS — M25551 Pain in right hip: Secondary | ICD-10-CM

## 2022-05-30 DIAGNOSIS — R2681 Unsteadiness on feet: Secondary | ICD-10-CM

## 2022-05-30 DIAGNOSIS — M6281 Muscle weakness (generalized): Secondary | ICD-10-CM

## 2022-05-30 NOTE — Therapy (Signed)
OUTPATIENT PHYSICAL THERAPY TREATMENT NOTE    Patient Name: Carolyn Esparza MRN: [id number] DOB:11-25-1965, 56 y.o., female Today's Date: 05/30/2022  PCP: [name], [name], MD REFERRING PROVIDER: Raelene Bott, PA  END OF SESSION:   PT End of Session - 05/30/22 1351     Visit Number 25    Number of Visits 67    Date for PT Re-Evaluation 07/17/22    Authorization Type [employer] reporting period from 04/24/2022    Authorization Time Period VL: max combined 60 PT/OT per year    Authorization - Visit Number 74    Authorization - Number of Visits 60    Progress Note Due on Visit 20    PT Start Time 1346    PT Stop Time 1426    PT Time Calculation (min) 40 min    Activity Tolerance Patient tolerated treatment well    Behavior During Therapy WFL for tasks assessed/performed             Past Medical History:  Diagnosis Date   Anxiety    ASCUS with positive high risk HPV cervical    CAD (coronary artery disease)    Chicken pox    Chronic pain    neck, back, b/l hips    Chronic pain of right hip 03/07/8109   Complication of anesthesia    DDD (degenerative disc disease), lumbar 01/06/2021   Depression    Facet arthritis of lumbar region 01/06/2021   GERD (gastroesophageal reflux disease)    History of Crohn's disease    Hyperlipidemia    Left ovarian cyst    s/p removal of 1 ovary ? which one removed per pt    Libido, decreased    Skin cancer    reports skin cancer removed from skin in 2016 or 2017   Thyroid nodule    repeat US resolved and Ct neck 05/2019 normal thyroid    Past Surgical History:  Procedure Laterality Date   ABDOMINAL HYSTERECTOMY     APPENDECTOMY  2004   BLADDER SURGERY     BREAST BIOPSY     x 2   COLONOSCOPY     COLONOSCOPY WITH PROPOFOL N/A 10/25/2020   Procedure: COLONOSCOPY WITH PROPOFOL;  Surgeon: Mauri Pole, MD;  Location: [facility];  Service: Endoscopy;  Laterality: N/A;   HEMOSTASIS CLIP PLACEMENT  10/25/2020   Procedure:  HEMOSTASIS CLIP PLACEMENT;  Surgeon: Mauri Pole, MD;  Location: [facility];  Service: Endoscopy;;   OOPHORECTOMY Right    Unilateral Right (per MRI 04/17/2018)   POLYPECTOMY  10/25/2020   Procedure: POLYPECTOMY;  Surgeon: Mauri Pole, MD;  Location: [facility];  Service: Endoscopy;;   SUBMUCOSAL LIFTING INJECTION  10/25/2020   Procedure: SUBMUCOSAL LIFTING INJECTION;  Surgeon: Mauri Pole, MD;  Location: [facility];  Service: Endoscopy;;   WISDOM TOOTH EXTRACTION     Patient Active Problem List   Diagnosis Date Noted   Cervicalgia 07/01/2021   Low back pain 07/01/2021   Herpes infection 07/01/2021   Chronic pain of right hip 04/28/2021   DDD (degenerative disc disease), lumbar 01/06/2021   Facet arthritis of lumbar region 01/06/2021   Trochanteric bursitis of both hips 11/30/2020   Special screening for malignant neoplasms, colon    Polyp of cecum    Pain in both feet 04/12/2020   Burning sensation of feet 04/12/2020   Pain of both hip joints 04/12/2020   Onychomycosis 04/12/2020   Fibrocystic breast disease (FCBD) 04/12/2020   Numbness in both  legs 09/09/2019   Spinal stenosis in cervical region 05/09/2019   Facet arthropathy, cervical 05/09/2019   Chronic pain 11/27/2018   Obesity (BMI 30.0-34.9) 11/27/2018   Sprain and strain of hip and thigh 05/10/2018   Abnormal MRI, cervical spine 03/28/2018   Menopause 11/26/2017   Right upper quadrant pain 11/26/2017   Abnormal Pap smear of cervix 07/27/2017   Vasomotor flushing 07/19/2017   Depression 07/18/2017   Nausea 06/25/2017   Chronic pain of both hips 06/25/2017   OSA (obstructive sleep apnea) 04/18/2017   Annual physical exam 04/10/2017   Fatigue 12/16/2016   Chronic back pain 05/30/2016   Abdominal pain 03/12/2016   Anxiety and depression 12/28/2015   History of obstructive sleep apnea 12/15/2015   GERD (gastroesophageal reflux disease) 12/15/2015   Hyperlipidemia 12/15/2015   Pain  medication agreement signed 03/25/2015   CAD in native artery 02/05/2015   Right supraspinatus tenosynovitis 08/17/2014   Crohn's disease involving terminal ileum ([place]) 03/30/2014   FH: colon polyps 03/30/2014   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 female who presents to outpatient physical therapy with a referral for medical diagnosis tear of right gluteus medius tendon, trochanteric bursitis of both hips, enthesopathy of hip region on both sides. This patient's chief complaints consist of right hip pain, stiffness, weakness, abnormal gait, difficulty with balance, and dysfunction s/p OPEN REPAIR R GLUTEUS MEDIUS gluteal repair 12/05/2021, leading to the following functional deficits: difficulty with usual activities and basic mobility including household and community ambulation, transfers, walking her dogs, ADLs, IADLs, going to the beach, stairs, sleeping, carrying, lifting, bending, doing activities that require weight bearing without loss of balance, walking the dogs. 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  Patient denies hx of stroke, seizures, lung problems, diabetes, unexplained  weight loss, unexplained changes in bowel or bladder problems, unexplained stumbling or dropping things, osteoporosis, and spinal surgery.  PRECAUTIONS: Other: can transition to weight bearing as tolerated.  See protocol in chart.  SUBJECTIVE: Patient arrives without SPC. She states she skipped exercises for Wed and Thursday because she was sore and her back was hurting her. She did not do anything on Saturday but she did some things on Sunday. She does not feel she can do all the exercises in one day so she is alternating when.    PAIN:  Are you having pain? NPRS 5/10 at right hip when walking.   OBJECTIVE  TODAY'S TREATMENT:  Therapeutic exercise: to centralize symptoms and improve ROM, strength, muscular endurance, and activity tolerance required for successful completion of functional activities.  - NuStep level 1-5 using bilateral upper and lower extremities. Seat/handle setting 6/9. For improved extremity mobility, muscular endurance, and activity tolerance; and to induce the analgesic effect of aerobic  exercise, stimulate improved joint nutrition, and prepare body structures and systems for following interventions. x 7 minutes. Average SPM = 64.  - step up to 6 inch step, with contralateral LE tap at next step up 3x10 on R side with UE support available but not used.  - lateral walking with YTB around ankles, 2x15 feet each direction.  - step standing pallof press, 1x10 each side with each foot except anchor to the right with left foot up.  - sit <> stand with staggered stance L foot front from 18 inch chair, 3x10  Pt required multimodal cuing for proper technique and to facilitate improved neuromuscular control, strength, range of motion, and functional ability resulting in improved performance and form.    PATIENT EDUCATION:  Education details:  Exercise purpose/form. Self management techniques.  Reviewed cancelation/no-show policy with patient and confirmed patient has correct phone  number for clinic; patient verbalized understanding (02/02/22). Person educated: Patient Education method: Explanation, demonstration, multimodal cuing.  Education comprehension: verbalized and demonstrated understanding and needs further education     HOME EXERCISE PROGRAM: Access Code: [email or web address] URL: [email or web address] Date: 05/23/2022 Prepared by: Rosita Kea  Exercises - Staggered Sit-to-Stand  - 1 x daily - 3 sets - 10 reps - Supine Bridge with Resistance Band  - 1 x daily - 1 sets - 20 reps - 5 seconds hold - Standing Hip Abduction with Resistance at Ankles and Unilateral Counter Support  - 1 x daily - 3 sets - 10 reps - 5 second hold hold - Sidelying Hip Abduction  - 1 x daily - 3 sets - 10 reps - Side Step Overs with Cones and Unilateral Counter Support  - 1 x daily - 1-3 sets - 10 reps - Single Leg Stance  - 1 x daily - 3 sets - 15-30 secoonds hold - Runner's Step Up/Down  - 3 x weekly - 2-3 sets - 10 reps - Side Stepping with Resistance at Ankles  - 3 x weekly - 2 sets - 10-20 feet   ASSESSMENT:   CLINICAL IMPRESSION: Patient arrives with mildly irritated right hip. She was able to complete all exercises without increase in pain. It is achy while she is sitting at end of sitting. She continued working on right hip strengthening. She continues to be limited in activity tolerance due to right hip pain. Patient would benefit from continued management of limiting condition by skilled physical therapist to address remaining impairments and functional limitations to work towards stated goals and return to PLOF or maximal functional independence.   From eva 01/26/2022:  Patient is a 56 y.o. female referred to outpatient physical therapy with a medical diagnosis of tear of right gluteus medius tendon, trochanteric bursitis of both hips, enthesopathy of hip region on both sides who presents with signs and symptoms consistent with right hip pain, stiffness, weakness,  dysfunction, imbalance, gait disturbance s/p OPEN REPAIR R GLUTEUS MEDIUS gluteal repair 12/05/2021 and . Patient presents with significant pain, ROM, posture, joint stiffness, tissue integrity, motor control, flexibility, gait, balance, muscle performance (strength/power/endurance) and activity tolerance impairments that are limiting ability to complete her usual activities and basic mobility including household and community ambulation, transfers, walking her dogs, ADLs, IADLs, going to the beach, stairs, sleeping, carrying, lifting, bending, doing activities that require weight bearing without loss of balance, walking the dogs without difficulty. Patient will benefit from skilled physical therapy intervention to address current body structure impairments and activity limitations to improve function and work towards goals set in  current POC in order to return to prior level of function or maximal functional improvement.    OBJECTIVE IMPAIRMENTS Abnormal gait, decreased activity tolerance, decreased balance, decreased coordination, decreased endurance, decreased knowledge of use of DME, decreased mobility, difficulty walking, decreased ROM, decreased strength, impaired perceived functional ability, impaired flexibility, improper body mechanics, postural dysfunction, and pain.    ACTIVITY LIMITATIONS carrying, lifting, bending, sitting, standing, squatting, sleeping, stairs, transfers, bed mobility, dressing, hygiene/grooming, locomotion level, and caring for others   PARTICIPATION LIMITATIONS: meal prep, cleaning, laundry, interpersonal relationship, driving, shopping, community activity, and   basic mobility including household and community ambulation, transfers, walking her dogs, ADLs, IADLs, going to the beach, stairs, sleeping, carrying, lifting, bending, doing activities that require weight bearing without loss of balance, walking the dogs.   PERSONAL FACTORS Past/current experiences, Time since onset  of injury/illness/exacerbation, Transportation, and 3+ comorbidities:   chronic back pain, chronic neck pain, hypermobile in several joints, GERD, fatigue, depression/anxiety, CAD (smallest artery in heart - no stent recommended - sees cardiologist no restrictions), skin cancer (cleared up a few years ago), chronic pain patient, on keto diet for about 1.5 years are also affecting patient's functional outcome.    REHAB POTENTIAL: Good   CLINICAL DECISION MAKING: Stable/uncomplicated   EVALUATION COMPLEXITY: Low     GOALS: Goals reviewed with patient? No   SHORT TERM GOALS: Target date: 02/09/2022   Patient will be independent with initial home exercise program for self-management of symptoms. Baseline: Initial HEP to be provided at visit 2 as appropriate (01/26/22); participates as able but struggles to get reps/sets in due to fatigue/soreness (03/06/2022); Goal status: partially met     LONG TERM GOALS: Target date: 04/20/2022. Updated to 07/17/2022 for all unmet goals on 04/24/2022.    Patient will be independent with a long-term home exercise program for self-management of symptoms.  Baseline: Initial HEP to be provided at visit 2 as appropriate (01/26/22); participates as able but struggles to get reps/sets in due to fatigue/soreness (03/06/2022); participating well (04/24/2022);  Goal status: In-progress   2.  Patient will demonstrate improved FOTO to equal or greater than 54 by visit #16 to demonstrate improvement in overall condition and self-reported functional ability.  Baseline: 30 (01/26/22); 58 at visit #10 (03/06/2022); 47 at visit #20 (04/24/2022);  Goal status: In-progress   3.  Patient will demonstrate R sided single leg stance of equal or greater than 30 seconds with no trendelenburg compensation to decrease fall risk and improve household and community ambulation.  Baseline: 0 seconds (01/26/22); 20 seconds with significant trendelenburg, unable to hold without trendelenburg  (03/06/2022); 20 seconds with mild trendelenburg, (04/24/2022); Goal status: In-progress   4.  Patient will ambulate equal or greater than 1000 feet with no AD during 6 min walk test to improve community ambulation.  Baseline: not tested (01/26/22); 617 feet with SPC. Altered gait pattern with trendelenburg pattern on R hip. Increased soreness (03/06/2022); 759 feet with SPC. Altered gait pattern with trendelenburg pattern on R hip. No increased soreness (04/24/2022); Goal status: In-progress   5.  Patient will complete community, work and/or recreational activities without limitation due to current condition.  Baseline: difficulty with usual activities including basic mobility including household and community ambulation, transfers, walking her dogs, ADLs, IADLs, going to the beach, stairs, sleeping, carrying, lifting, bending, doing activities that require weight bearing without loss of balance(01/26/22); reports she still cannot walk the dogs, is able to go up and down a few steps, continues to have difficulty  with similar limitations with mild improvement (03/06/2022); has started taking some short walks to end of driveway with dogs and husband to help with dogs as needed, able to take more steps without AD at home (04/24/2022);  Goal status: In-progress   6.  Patient will demonstrate B hip abduction MMT equal or greater than 4+/5 with no increase in pain to improve activity tolerance for walking her dog.  Baseline: R = 3/5 with pain, left not tested due to lack of tolerance for laying on R side (01/26/2022);  R = 4/5 with pain (patient also fatigues by 10 reps and is unable to do more than 2 sets of 10 reps of dynamic hip abduction), L = 4+/5 (03/06/2022);  R = 4/5 without pain but feels tired (04/24/2022);  Goal status: In-progress       PLAN: PT FREQUENCY: 1-2x/week   PT DURATION: 12 weeks   PLANNED INTERVENTIONS: Therapeutic exercises, Therapeutic activity, Neuromuscular re-education, Balance  training, Gait training, Patient/Family education, Joint mobilization, Stair training, DME instructions, Aquatic Therapy, Dry Needling, Electrical stimulation, Spinal mobilization, Cryotherapy, Moist heat, Manual therapy, and Re-evaluation   PLAN FOR NEXT SESSION: update HEP as appropriate, progressive strengthening, ROM, gait training, and balance within protocol guidelines. Work towards ability to tolerate R SLS.    Everlean Alstrom. Graylon Good, PT, DPT 05/30/22, 2:30 PM  [facility] [address] [place], [place] [address] P: [phone] I F: [phone]

## 2022-06-01 ENCOUNTER — Ambulatory Visit: Payer: Managed Care, Other (non HMO) | Admitting: Physical Therapy

## 2022-06-05 ENCOUNTER — Encounter: Payer: Managed Care, Other (non HMO) | Admitting: Physical Therapy

## 2022-06-07 ENCOUNTER — Encounter: Payer: Self-pay | Admitting: Physical Therapy

## 2022-06-07 ENCOUNTER — Ambulatory Visit: Payer: Managed Care, Other (non HMO) | Attending: Orthopedic Surgery | Admitting: Physical Therapy

## 2022-06-07 DIAGNOSIS — R262 Difficulty in walking, not elsewhere classified: Secondary | ICD-10-CM | POA: Diagnosis present

## 2022-06-07 DIAGNOSIS — M25552 Pain in left hip: Secondary | ICD-10-CM | POA: Insufficient documentation

## 2022-06-07 DIAGNOSIS — R2681 Unsteadiness on feet: Secondary | ICD-10-CM | POA: Insufficient documentation

## 2022-06-07 DIAGNOSIS — M25551 Pain in right hip: Secondary | ICD-10-CM | POA: Insufficient documentation

## 2022-06-07 DIAGNOSIS — M6281 Muscle weakness (generalized): Secondary | ICD-10-CM | POA: Diagnosis present

## 2022-06-07 NOTE — Therapy (Signed)
OUTPATIENT PHYSICAL THERAPY TREATMENT NOTE    Patient Name: Carolyn Esparza MRN: [id number] DOB:1966-05-13, 56 y.o., female Today's Date: 06/07/2022  PCP: [name], [name], MD REFERRING PROVIDER: Raelene Bott, PA  END OF SESSION:   PT End of Session - 06/07/22 1515     Visit Number 26    Number of Visits 32    Date for PT Re-Evaluation 07/17/22    Authorization Type [employer] reporting period from 04/24/2022    Authorization Time Period VL: max combined 60 PT/OT per year    Authorization - Visit Number 33    Authorization - Number of Visits 60    Progress Note Due on Visit 44    PT Start Time 1435    PT Stop Time 1515    PT Time Calculation (min) 40 min    Activity Tolerance Patient tolerated treatment well    Behavior During Therapy WFL for tasks assessed/performed              Past Medical History:  Diagnosis Date   Anxiety    ASCUS with positive high risk HPV cervical    CAD (coronary artery disease)    Chicken pox    Chronic pain    neck, back, b/l hips    Chronic pain of right hip 3/54/6568   Complication of anesthesia    DDD (degenerative disc disease), lumbar 01/06/2021   Depression    Facet arthritis of lumbar region 01/06/2021   GERD (gastroesophageal reflux disease)    History of Crohn's disease    Hyperlipidemia    Left ovarian cyst    s/p removal of 1 ovary ? which one removed per pt    Libido, decreased    Skin cancer    reports skin cancer removed from skin in 2016 or 2017   Thyroid nodule    repeat US resolved and Ct neck 05/2019 normal thyroid    Past Surgical History:  Procedure Laterality Date   ABDOMINAL HYSTERECTOMY     APPENDECTOMY  2004   BLADDER SURGERY     BREAST BIOPSY     x 2   COLONOSCOPY     COLONOSCOPY WITH PROPOFOL N/A 10/25/2020   Procedure: COLONOSCOPY WITH PROPOFOL;  Surgeon: Mauri Pole, MD;  Location: [facility];  Service: Endoscopy;  Laterality: N/A;   HEMOSTASIS CLIP PLACEMENT  10/25/2020   Procedure:  HEMOSTASIS CLIP PLACEMENT;  Surgeon: Mauri Pole, MD;  Location: [facility];  Service: Endoscopy;;   OOPHORECTOMY Right    Unilateral Right (per MRI 04/17/2018)   POLYPECTOMY  10/25/2020   Procedure: POLYPECTOMY;  Surgeon: Mauri Pole, MD;  Location: [facility];  Service: Endoscopy;;   SUBMUCOSAL LIFTING INJECTION  10/25/2020   Procedure: SUBMUCOSAL LIFTING INJECTION;  Surgeon: Mauri Pole, MD;  Location: [facility];  Service: Endoscopy;;   WISDOM TOOTH EXTRACTION     Patient Active Problem List   Diagnosis Date Noted   Cervicalgia 07/01/2021   Low back pain 07/01/2021   Herpes infection 07/01/2021   Chronic pain of right hip 04/28/2021   DDD (degenerative disc disease), lumbar 01/06/2021   Facet arthritis of lumbar region 01/06/2021   Trochanteric bursitis of both hips 11/30/2020   Special screening for malignant neoplasms, colon    Polyp of cecum    Pain in both feet 04/12/2020   Burning sensation of feet 04/12/2020   Pain of both hip joints 04/12/2020   Onychomycosis 04/12/2020   Fibrocystic breast disease (FCBD) 04/12/2020   Numbness in  both legs 09/09/2019   Spinal stenosis in cervical region 05/09/2019   Facet arthropathy, cervical 05/09/2019   Chronic pain 11/27/2018   Obesity (BMI 30.0-34.9) 11/27/2018   Sprain and strain of hip and thigh 05/10/2018   Abnormal MRI, cervical spine 03/28/2018   Menopause 11/26/2017   Right upper quadrant pain 11/26/2017   Abnormal Pap smear of cervix 07/27/2017   Vasomotor flushing 07/19/2017   Depression 07/18/2017   Nausea 06/25/2017   Chronic pain of both hips 06/25/2017   OSA (obstructive sleep apnea) 04/18/2017   Annual physical exam 04/10/2017   Fatigue 12/16/2016   Chronic back pain 05/30/2016   Abdominal pain 03/12/2016   Anxiety and depression 12/28/2015   History of obstructive sleep apnea 12/15/2015   GERD (gastroesophageal reflux disease) 12/15/2015   Hyperlipidemia 12/15/2015   Pain  medication agreement signed 03/25/2015   CAD in native artery 02/05/2015   Right supraspinatus tenosynovitis 08/17/2014   Crohn's disease involving terminal ileum ([place]) 03/30/2014   FH: colon polyps 03/30/2014   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 female who presents to outpatient physical therapy with a referral for medical diagnosis tear of right gluteus medius tendon, trochanteric bursitis of both hips, enthesopathy of hip region on both sides. This patient's chief complaints consist of right hip pain, stiffness, weakness, abnormal gait, difficulty with balance, and dysfunction s/p OPEN REPAIR R GLUTEUS MEDIUS gluteal repair 12/05/2021, leading to the following functional deficits: difficulty with usual activities and basic mobility including household and community ambulation, transfers, walking her dogs, ADLs, IADLs, going to the beach, stairs, sleeping, carrying, lifting, bending, doing activities that require weight bearing without loss of balance, walking the dogs. 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  Patient denies hx of stroke, seizures, lung problems, diabetes, unexplained  weight loss, unexplained changes in bowel or bladder problems, unexplained stumbling or dropping things, osteoporosis, and spinal surgery.  PRECAUTIONS: Other: can transition to weight bearing as tolerated.  See protocol in chart.  SUBJECTIVE: Patient arrives without SPC. She states she is frustrated because she feels like she takes one step forward and several steps backwards. She was sore after PT last session and also did a lot of walking to prep for trick-or-treater. Her low back and right hip have been aching constantly and she has been using the walker at home. HEP has not been going well. She only tried to do them one day and then she did not do all of them. She feels like she has back slid on laying on her side and doing leg raises. She states on Sunday her hip felt weak when doing this exercise.    PAIN:  Are you having pain? NPRS 4/10 at right hip and 5/10 in low back  all the time.   OBJECTIVE  TODAY'S TREATMENT:  Therapeutic exercise: to centralize symptoms and improve ROM, strength, muscular endurance, and activity tolerance required for successful completion of functional activities.  - NuStep level 1-5 using bilateral upper and lower extremities. Seat/handle setting 6/9. For improved extremity mobility, muscular endurance, and activity tolerance; and to induce the analgesic effect of aerobic exercise, stimulate improved joint nutrition, and prepare body structures and systems for following interventions. x 7:25 minutes. Average SPM = 69.  - seated lumbar flexion roll out with clear theraball, 1x2 min self selected pace.  (Manual therapy/dry needling - see below).  - sidelying R hip abduction 3x5-10. Cuing to improve alignment and keep pelvis still.   Manual therapy: to reduce pain and tissue tension, improve range of motion, neuromodulation, in order to promote improved ability to complete functional activities. PRONE - STM to bilateral lumbar paraspinals and QL (R> L) and right  glute med.   Modality: (unbilled) Dry needling performed to low back to decrease pain and spasms along patient's low back and right hip region with patient in prone utilizing 2 dry needle(s) .22m x 748mwith 8 sticks total at bilateral lumbar multifidi at L3-L5 (one additional stick each side due to initial stick being in uncomfortable position) . Patient educated about the risks and benefits from therapy and verbally consents to treatment.  Dry needling performed by SaEverlean AlstromSnGraylon GoodT, DPT who is certified in this technique.  Pt required multimodal cuing for proper technique and to facilitate improved neuromuscular control, strength, range of motion, and functional ability resulting in improved performance and form.    PATIENT EDUCATION:  Education details:  Exercise purpose/form. Self management techniques.  Reviewed cancelation/no-show policy with patient and confirmed patient has correct phone number for clinic; patient verbalized understanding (02/02/22). Person educated: Patient Education method: Explanation, demonstration, multimodal cuing.  Education comprehension: verbalized and demonstrated understanding and needs further education     HOME EXERCISE PROGRAM: Access Code: [email or web address]: [email or web address] Date: 05/23/2022 Prepared by: SaRosita KeaExercises - Staggered Sit-to-Stand  - 1 x daily - 3 sets - 10 reps - Supine Bridge with Resistance Band  - 1 x daily - 1 sets - 20 reps - 5 seconds hold - Standing Hip Abduction with Resistance at Ankles and Unilateral Counter Support  - 1 x daily - 3 sets - 10 reps - 5 second hold hold - Sidelying Hip Abduction  - 1 x daily - 3 sets - 10 reps - Side Step Overs with Cones and Unilateral Counter Support  - 1 x daily - 1-3 sets - 10 reps - Single Leg Stance  - 1 x daily - 3 sets - 15-30 secoonds hold - Runner's Step Up/Down  - 3 x weekly - 2-3 sets - 10 reps - Side Stepping with Resistance at Ankles  - 3 x weekly - 2  sets - 10-20 feet   ASSESSMENT:   CLINICAL IMPRESSION: Patient arrives with low back and right hip more irritated than usual. Utilized dry needling at lumbar spine for pain control and to look for any link between right sided hip pain and low back. Patient reported reduction in low back pain and less feeling that it is spasming following dry needling. STM also completed at low back and right glute med region. Noted for tenderness to palpation near greater trochanter but not in muscle belly. Decreased loading this session due to decreased tolerance likely due to too much load after feeling good. Patient's positioning  for sidelying hip abduction improved with cuing and now able to perform with less discomfort. Plan to progress load again gradually next session as appropriate. Patient would benefit from continued management of limiting condition by skilled physical therapist to address remaining impairments and functional limitations to work towards stated goals and return to PLOF or maximal functional independence.    From eva 01/26/2022:  Patient is a 56 y.o. female referred to outpatient physical therapy with a medical diagnosis of tear of right gluteus medius tendon, trochanteric bursitis of both hips, enthesopathy of hip region on both sides who presents with signs and symptoms consistent with right hip pain, stiffness, weakness, dysfunction, imbalance, gait disturbance s/p OPEN REPAIR R GLUTEUS MEDIUS gluteal repair 12/05/2021 and . Patient presents with significant pain, ROM, posture, joint stiffness, tissue integrity, motor control, flexibility, gait, balance, muscle performance (strength/power/endurance) and activity tolerance impairments that are limiting ability to complete her usual activities and basic mobility including household and community ambulation, transfers, walking her dogs, ADLs, IADLs, going to the beach, stairs, sleeping, carrying, lifting, bending, doing activities that require weight  bearing without loss of balance, walking the dogs without difficulty. Patient will benefit from skilled physical therapy intervention to address current body structure impairments and activity limitations to improve function and work towards goals set in current POC in order to return to prior level of function or maximal functional improvement.    OBJECTIVE IMPAIRMENTS Abnormal gait, decreased activity tolerance, decreased balance, decreased coordination, decreased endurance, decreased knowledge of use of DME, decreased mobility, difficulty walking, decreased ROM, decreased strength, impaired perceived functional ability, impaired flexibility, improper body mechanics, postural dysfunction, and pain.    ACTIVITY LIMITATIONS carrying, lifting, bending, sitting, standing, squatting, sleeping, stairs, transfers, bed mobility, dressing, hygiene/grooming, locomotion level, and caring for others   PARTICIPATION LIMITATIONS: meal prep, cleaning, laundry, interpersonal relationship, driving, shopping, community activity, and   basic mobility including household and community ambulation, transfers, walking her dogs, ADLs, IADLs, going to the beach, stairs, sleeping, carrying, lifting, bending, doing activities that require weight bearing without loss of balance, walking the dogs.   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    REHAB POTENTIAL: Good   CLINICAL DECISION MAKING: Stable/uncomplicated   EVALUATION COMPLEXITY: Low     GOALS: Goals reviewed with patient? No   SHORT TERM  GOALS: Target date: 02/09/2022   Patient will be independent with initial home exercise program for self-management of symptoms. Baseline: Initial HEP to be provided at visit 2 as appropriate (01/26/22); participates as able but struggles to get reps/sets in due to fatigue/soreness (03/06/2022); Goal status: partially met     LONG TERM GOALS: Target date: 04/20/2022. Updated to 07/17/2022 for all unmet goals on 04/24/2022.    Patient will be independent with a long-term home exercise program for self-management of symptoms.  Baseline: Initial HEP to be provided at visit 2 as appropriate (01/26/22); participates as able but struggles to get reps/sets in due to fatigue/soreness (03/06/2022); participating well (04/24/2022);  Goal status: In-progress   2.  Patient will demonstrate improved FOTO to equal or greater than 54 by visit #16 to demonstrate improvement in overall condition and  self-reported functional ability.  Baseline: 30 (01/26/22); 58 at visit #10 (03/06/2022); 47 at visit #20 (04/24/2022);  Goal status: In-progress   3.  Patient will demonstrate R sided single leg stance of equal or greater than 30 seconds with no trendelenburg compensation to decrease fall risk and improve household and community ambulation.  Baseline: 0 seconds (01/26/22); 20 seconds with significant trendelenburg, unable to hold without trendelenburg (03/06/2022); 20 seconds with mild trendelenburg, (04/24/2022); Goal status: In-progress   4.  Patient will ambulate equal or greater than 1000 feet with no AD during 6 min walk test to improve community ambulation.  Baseline: not tested (01/26/22); 617 feet with SPC. Altered gait pattern with trendelenburg pattern on R hip. Increased soreness (03/06/2022); 759 feet with SPC. Altered gait pattern with trendelenburg pattern on R hip. No increased soreness (04/24/2022); Goal status: In-progress   5.  Patient will complete community, work and/or recreational activities without  limitation due to current condition.  Baseline: difficulty with usual activities including basic mobility including household and community ambulation, transfers, walking her dogs, ADLs, IADLs, going to the beach, stairs, sleeping, carrying, lifting, bending, doing activities that require weight bearing without loss of balance(01/26/22); reports she still cannot walk the dogs, is able to go up and down a few steps, continues to have difficulty with similar limitations with mild improvement (03/06/2022); has started taking some short walks to end of driveway with dogs and husband to help with dogs as needed, able to take more steps without AD at home (04/24/2022);  Goal status: In-progress   6.  Patient will demonstrate B hip abduction MMT equal or greater than 4+/5 with no increase in pain to improve activity tolerance for walking her dog.  Baseline: R = 3/5 with pain, left not tested due to lack of tolerance for laying on R side (01/26/2022);  R = 4/5 with pain (patient also fatigues by 10 reps and is unable to do more than 2 sets of 10 reps of dynamic hip abduction), L = 4+/5 (03/06/2022);  R = 4/5 without pain but feels tired (04/24/2022);  Goal status: In-progress       PLAN: PT FREQUENCY: 1-2x/week   PT DURATION: 12 weeks   PLANNED INTERVENTIONS: Therapeutic exercises, Therapeutic activity, Neuromuscular re-education, Balance training, Gait training, Patient/Family education, Joint mobilization, Stair training, DME instructions, Aquatic Therapy, Dry Needling, Electrical stimulation, Spinal mobilization, Cryotherapy, Moist heat, Manual therapy, and Re-evaluation   PLAN FOR NEXT SESSION: update HEP as appropriate, progressive strengthening, ROM, gait training, and balance within protocol guidelines. Work towards ability to tolerate R SLS.    Everlean Alstrom. Graylon Good, PT, DPT 06/07/22, 7:49 PM  [facility] [address] [place], [place] [address] P: [phone] I F:  [phone]

## 2022-06-13 ENCOUNTER — Encounter: Payer: Managed Care, Other (non HMO) | Admitting: Physical Therapy

## 2022-06-14 ENCOUNTER — Ambulatory Visit: Payer: Managed Care, Other (non HMO) | Admitting: Physical Therapy

## 2022-06-14 ENCOUNTER — Encounter: Payer: Self-pay | Admitting: Physical Therapy

## 2022-06-14 DIAGNOSIS — M25552 Pain in left hip: Secondary | ICD-10-CM

## 2022-06-14 DIAGNOSIS — R2681 Unsteadiness on feet: Secondary | ICD-10-CM

## 2022-06-14 DIAGNOSIS — M6281 Muscle weakness (generalized): Secondary | ICD-10-CM

## 2022-06-14 DIAGNOSIS — R262 Difficulty in walking, not elsewhere classified: Secondary | ICD-10-CM

## 2022-06-14 DIAGNOSIS — M25551 Pain in right hip: Secondary | ICD-10-CM

## 2022-06-14 NOTE — Therapy (Signed)
OUTPATIENT PHYSICAL THERAPY TREATMENT NOTE    Patient Name: Carolyn Esparza MRN: [id number] DOB:11-Jun-1966, 56 y.o., female Today's Date: 06/14/2022  PCP: [name], [name], MD REFERRING PROVIDER: Raelene Bott, PA  END OF SESSION:   PT End of Session - 06/14/22 1436     Visit Number 27    Number of Visits 23    Date for PT Re-Evaluation 07/17/22    Authorization Type [employer] reporting period from 04/24/2022    Authorization Time Period VL: max combined 60 PT/OT per year    Authorization - Visit Number 43    Authorization - Number of Visits 60    Progress Note Due on Visit 20    PT Start Time 1431    PT Stop Time 1510    PT Time Calculation (min) 39 min    Activity Tolerance Patient tolerated treatment well    Behavior During Therapy WFL for tasks assessed/performed               Past Medical History:  Diagnosis Date   Anxiety    ASCUS with positive high risk HPV cervical    CAD (coronary artery disease)    Chicken pox    Chronic pain    neck, back, b/l hips    Chronic pain of right hip 0/34/7425   Complication of anesthesia    DDD (degenerative disc disease), lumbar 01/06/2021   Depression    Facet arthritis of lumbar region 01/06/2021   GERD (gastroesophageal reflux disease)    History of Crohn's disease    Hyperlipidemia    Left ovarian cyst    s/p removal of 1 ovary ? which one removed per pt    Libido, decreased    Skin cancer    reports skin cancer removed from skin in 2016 or 2017   Thyroid nodule    repeat US resolved and Ct neck 05/2019 normal thyroid    Past Surgical History:  Procedure Laterality Date   ABDOMINAL HYSTERECTOMY     APPENDECTOMY  2004   BLADDER SURGERY     BREAST BIOPSY     x 2   COLONOSCOPY     COLONOSCOPY WITH PROPOFOL N/A 10/25/2020   Procedure: COLONOSCOPY WITH PROPOFOL;  Surgeon: Mauri Pole, MD;  Location: [facility];  Service: Endoscopy;  Laterality: N/A;   HEMOSTASIS CLIP PLACEMENT  10/25/2020    Procedure: HEMOSTASIS CLIP PLACEMENT;  Surgeon: Mauri Pole, MD;  Location: [facility];  Service: Endoscopy;;   OOPHORECTOMY Right    Unilateral Right (per MRI 04/17/2018)   POLYPECTOMY  10/25/2020   Procedure: POLYPECTOMY;  Surgeon: Mauri Pole, MD;  Location: [facility];  Service: Endoscopy;;   SUBMUCOSAL LIFTING INJECTION  10/25/2020   Procedure: SUBMUCOSAL LIFTING INJECTION;  Surgeon: Mauri Pole, MD;  Location: [facility];  Service: Endoscopy;;   WISDOM TOOTH EXTRACTION     Patient Active Problem List   Diagnosis Date Noted   Cervicalgia 07/01/2021   Low back pain 07/01/2021   Herpes infection 07/01/2021   Chronic pain of right hip 04/28/2021   DDD (degenerative disc disease), lumbar 01/06/2021   Facet arthritis of lumbar region 01/06/2021   Trochanteric bursitis of both hips 11/30/2020   Special screening for malignant neoplasms, colon    Polyp of cecum    Pain in both feet 04/12/2020   Burning sensation of feet 04/12/2020   Pain of both hip joints 04/12/2020   Onychomycosis 04/12/2020   Fibrocystic breast disease (FCBD) 04/12/2020   Numbness  in both legs 09/09/2019   Spinal stenosis in cervical region 05/09/2019   Facet arthropathy, cervical 05/09/2019   Chronic pain 11/27/2018   Obesity (BMI 30.0-34.9) 11/27/2018   Sprain and strain of hip and thigh 05/10/2018   Abnormal MRI, cervical spine 03/28/2018   Menopause 11/26/2017   Right upper quadrant pain 11/26/2017   Abnormal Pap smear of cervix 07/27/2017   Vasomotor flushing 07/19/2017   Depression 07/18/2017   Nausea 06/25/2017   Chronic pain of both hips 06/25/2017   OSA (obstructive sleep apnea) 04/18/2017   Annual physical exam 04/10/2017   Fatigue 12/16/2016   Chronic back pain 05/30/2016   Abdominal pain 03/12/2016   Anxiety and depression 12/28/2015   History of obstructive sleep apnea 12/15/2015   GERD (gastroesophageal reflux disease) 12/15/2015   Hyperlipidemia 12/15/2015    Pain medication agreement signed 03/25/2015   CAD in native artery 02/05/2015   Right supraspinatus tenosynovitis 08/17/2014   Crohn's disease involving terminal ileum ([place]) 03/30/2014   FH: colon polyps 03/30/2014   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 female who presents to outpatient physical therapy with a referral for medical diagnosis tear of right gluteus medius tendon, trochanteric bursitis of both hips, enthesopathy of hip region on both sides. This patient's chief complaints consist of right hip pain, stiffness, weakness, abnormal gait, difficulty with balance, and dysfunction s/p OPEN REPAIR R GLUTEUS MEDIUS gluteal repair 12/05/2021, leading to the following functional deficits: difficulty with usual activities and basic mobility including household and community ambulation, transfers, walking her dogs, ADLs, IADLs, going to the beach, stairs, sleeping, carrying, lifting, bending, doing activities that require weight bearing without loss of balance, walking the dogs. 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  Patient denies hx of stroke, seizures, lung problems, diabetes,  unexplained weight loss, unexplained changes in bowel or bladder problems, unexplained stumbling or dropping things, osteoporosis, and spinal surgery.  PRECAUTIONS: Other: can transition to weight bearing as tolerated.  See protocol in chart.  SUBJECTIVE: Patient arrives with no AD. She states she did HEP on Sat, Mon, and Tuesday. She states it went pretty good but she was not able to do all of the exercises on her list. She reports her right hip is feeling okay after having a hot bath, but it was bothering her earlier today. She was sore after last PT session. She felt most of it was her back instead of her hip. She states the sidelying hip abduction is bothering her back. She has not gone back to using the band yet. She thinks her RF procedure has completely worn off. She tries to do the RF injection yearly in the spring before vacation. She went to the movie theater this weekend and had movie theater  and had more trouble with the steps than she expected while using her cane.    PAIN:  Are you having pain? NPRS 3/10 at right hip (sore and achy), and 6/10 in low back all the time.   OBJECTIVE  TODAY'S TREATMENT:  Therapeutic exercise: to centralize symptoms and improve ROM, strength, muscular endurance, and activity tolerance required for successful completion of functional activities.  - NuStep level 4 using bilateral upper and lower extremities. Seat/handle setting 6/9. For improved extremity mobility, muscular endurance, and activity tolerance; and to induce the analgesic effect of aerobic exercise, stimulate improved joint nutrition, and prepare body structures and systems for following interventions. x 5 minutes. Average SPM = 65.  - standing hip abduction with U UE support, 3x10 each side. Cuing to decrease trendelenburg when standing on R LE.  - sit <> stand from 18 inch chair in staggered stance with right foot back, 3x10.  - modified single leg stance with left foot on 8 inch step, 1x10.  VC/TC at pelvis to decrease trendelenburg.  - lateral stepping back and forth over 4 inch aerobics step, 3x10 each side.  - seated lumbar flexion roll out with clear theraball, 1x3 min self selected pace.   Pt required multimodal cuing for proper technique and to facilitate improved neuromuscular control, strength, range of motion, and functional ability resulting in improved performance and form.    PATIENT EDUCATION:  Education details:  Exercise purpose/form. Self management techniques.  Reviewed cancelation/no-show policy with patient and confirmed patient has correct phone number for clinic; patient verbalized understanding (02/02/22). Person educated: Patient Education method: Explanation, demonstration, multimodal cuing.  Education comprehension: verbalized and demonstrated understanding and needs further education     HOME EXERCISE PROGRAM: Access Code: [email or web address] URL: [email or web address] Date: 05/23/2022 Prepared by: Rosita Kea  Exercises - Staggered Sit-to-Stand  - 1 x daily - 3 sets - 10 reps - Supine Bridge with Resistance Band  - 1 x daily - 1 sets - 20 reps - 5 seconds hold - Standing Hip Abduction with Resistance at Ankles and Unilateral Counter Support  - 1 x daily - 3 sets - 10 reps - 5 second hold hold - Sidelying Hip Abduction  - 1 x daily - 3 sets - 10 reps - Side Step Overs with Cones and Unilateral Counter Support  - 1 x daily - 1-3 sets - 10 reps - Single Leg Stance  - 1 x daily - 3 sets - 15-30 secoonds hold - Runner's Step Up/Down  - 3 x weekly - 2-3 sets - 10 reps - Side Stepping with Resistance at Ankles  - 3 x weekly - 2 sets - 10-20 feet   ASSESSMENT:   CLINICAL IMPRESSION: Patient arrives with improved pain at right hip but continued increased pain at low back. Continued with exercises right hip as tolerated. Patient continues to have difficulty with R single leg stance due to trendelenburg stance related to limited strength and endurance  at lateral hip. Patient continues to need cuing for exercise selection, volume, and form. Plan to continue with progressive exercises to address limitations as tolerated. Patient would benefit from continued management of limiting condition by skilled physical therapist to address remaining impairments and functional limitations to work towards stated goals and return to PLOF or maximal functional independence.   From eval 01/26/2022:  Patient is a 56 y.o. female referred to outpatient physical therapy with a medical diagnosis of tear of right gluteus medius tendon, trochanteric bursitis of both hips, enthesopathy of  hip region on both sides who presents with signs and symptoms consistent with right hip pain, stiffness, weakness, dysfunction, imbalance, gait disturbance s/p OPEN REPAIR R GLUTEUS MEDIUS gluteal repair 12/05/2021 and . Patient presents with significant pain, ROM, posture, joint stiffness, tissue integrity, motor control, flexibility, gait, balance, muscle performance (strength/power/endurance) and activity tolerance impairments that are limiting ability to complete her usual activities and basic mobility including household and community ambulation, transfers, walking her dogs, ADLs, IADLs, going to the beach, stairs, sleeping, carrying, lifting, bending, doing activities that require weight bearing without loss of balance, walking the dogs without difficulty. Patient will benefit from skilled physical therapy intervention to address current body structure impairments and activity limitations to improve function and work towards goals set in current POC in order to return to prior level of function or maximal functional improvement.    OBJECTIVE IMPAIRMENTS Abnormal gait, decreased activity tolerance, decreased balance, decreased coordination, decreased endurance, decreased knowledge of use of DME, decreased mobility, difficulty walking, decreased ROM, decreased strength, impaired perceived  functional ability, impaired flexibility, improper body mechanics, postural dysfunction, and pain.    ACTIVITY LIMITATIONS carrying, lifting, bending, sitting, standing, squatting, sleeping, stairs, transfers, bed mobility, dressing, hygiene/grooming, locomotion level, and caring for others   PARTICIPATION LIMITATIONS: meal prep, cleaning, laundry, interpersonal relationship, driving, shopping, community activity, and   basic mobility including household and community ambulation, transfers, walking her dogs, ADLs, IADLs, going to the beach, stairs, sleeping, carrying, lifting, bending, doing activities that require weight bearing without loss of balance, walking the dogs.   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    REHAB POTENTIAL: Good   CLINICAL DECISION MAKING: Stable/uncomplicated   EVALUATION COMPLEXITY: Low     GOALS: Goals reviewed with patient? No   SHORT TERM GOALS: Target date: 02/09/2022   Patient will be independent with initial home exercise program for self-management of symptoms. Baseline: Initial HEP to be provided at visit 2 as appropriate (01/26/22); participates as able but struggles to get reps/sets in due to fatigue/soreness (03/06/2022); Goal status: partially met     LONG TERM GOALS: Target date: 04/20/2022. Updated to 07/17/2022 for all unmet goals on 04/24/2022.    Patient will be independent with a long-term home exercise program for self-management of symptoms.  Baseline: Initial HEP to be provided at visit 2 as appropriate (01/26/22);  participates as able but struggles to get reps/sets in due to fatigue/soreness (03/06/2022); participating well (04/24/2022);  Goal status: In-progress   2.  Patient will demonstrate improved FOTO to equal or greater than 54 by visit #16 to demonstrate improvement in overall condition and self-reported functional ability.  Baseline: 30 (01/26/22); 58 at visit #10 (03/06/2022); 47 at visit #20 (04/24/2022);  Goal status: In-progress   3.  Patient will demonstrate R sided single leg stance of equal or greater than 30 seconds with no trendelenburg compensation to decrease fall risk and improve household and community ambulation.  Baseline: 0 seconds (01/26/22); 20 seconds with significant trendelenburg, unable to hold without trendelenburg (03/06/2022); 20 seconds with mild trendelenburg, (04/24/2022); Goal status: In-progress   4.  Patient will ambulate equal or greater than 1000 feet with no  AD during 6 min walk test to improve community ambulation.  Baseline: not tested (01/26/22); 617 feet with SPC. Altered gait pattern with trendelenburg pattern on R hip. Increased soreness (03/06/2022); 759 feet with SPC. Altered gait pattern with trendelenburg pattern on R hip. No increased soreness (04/24/2022); Goal status: In-progress   5.  Patient will complete community, work and/or recreational activities without limitation due to current condition.  Baseline: difficulty with usual activities including basic mobility including household and community ambulation, transfers, walking her dogs, ADLs, IADLs, going to the beach, stairs, sleeping, carrying, lifting, bending, doing activities that require weight bearing without loss of balance(01/26/22); reports she still cannot walk the dogs, is able to go up and down a few steps, continues to have difficulty with similar limitations with mild improvement (03/06/2022); has started taking some short walks to end of driveway with dogs and husband to help with dogs as needed, able  to take more steps without AD at home (04/24/2022);  Goal status: In-progress   6.  Patient will demonstrate B hip abduction MMT equal or greater than 4+/5 with no increase in pain to improve activity tolerance for walking her dog.  Baseline: R = 3/5 with pain, left not tested due to lack of tolerance for laying on R side (01/26/2022);  R = 4/5 with pain (patient also fatigues by 10 reps and is unable to do more than 2 sets of 10 reps of dynamic hip abduction), L = 4+/5 (03/06/2022);  R = 4/5 without pain but feels tired (04/24/2022);  Goal status: In-progress       PLAN: PT FREQUENCY: 1-2x/week   PT DURATION: 12 weeks   PLANNED INTERVENTIONS: Therapeutic exercises, Therapeutic activity, Neuromuscular re-education, Balance training, Gait training, Patient/Family education, Joint mobilization, Stair training, DME instructions, Aquatic Therapy, Dry Needling, Electrical stimulation, Spinal mobilization, Cryotherapy, Moist heat, Manual therapy, and Re-evaluation   PLAN FOR NEXT SESSION: update HEP as appropriate, progressive strengthening, ROM, gait training, and balance within protocol guidelines. Work towards ability to tolerate R SLS.    Everlean Alstrom. Graylon Good, PT, DPT 06/14/22, 3:09 PM  [facility] [address] [place], [place] [address] P: [phone] I F: [phone]

## 2022-06-19 ENCOUNTER — Ambulatory Visit: Payer: Self-pay | Admitting: Physical Therapy

## 2022-06-19 ENCOUNTER — Telehealth: Payer: Self-pay | Admitting: Physical Therapy

## 2022-06-19 DIAGNOSIS — R262 Difficulty in walking, not elsewhere classified: Secondary | ICD-10-CM

## 2022-06-19 DIAGNOSIS — M25551 Pain in right hip: Secondary | ICD-10-CM

## 2022-06-19 DIAGNOSIS — M25552 Pain in left hip: Secondary | ICD-10-CM

## 2022-06-19 DIAGNOSIS — M6281 Muscle weakness (generalized): Secondary | ICD-10-CM

## 2022-06-19 DIAGNOSIS — R2681 Unsteadiness on feet: Secondary | ICD-10-CM

## 2022-06-19 NOTE — Therapy (Addendum)
OUTPATIENT PHYSICAL THERAPY NO-VISIT DISCHARGE SUMMARY Dates of reporting from 01/26/2022 to 06/19/2022   Patient Name: Carolyn Esparza MRN: [id number] DOB:Dec 28, 1965, 56 y.o., female Today's Date: 06/19/2022  PCP: [name], [name], MD REFERRING PROVIDER: Raelene Bott, PA  END OF SESSION:   PT End of Session - 06/19/22 1226     Visit Number 0    Number of Visits 36    Date for PT Re-Evaluation 07/17/22    Authorization Type [employer] reporting period from 04/24/2022    Authorization Time Period VL: max combined 60 PT/OT per year    Authorization - Number of Visits 60    Progress Note Due on Visit 20    Activity Tolerance --    Behavior During Therapy --                Past Medical History:  Diagnosis Date   Anxiety    ASCUS with positive high risk HPV cervical    CAD (coronary artery disease)    Chicken pox    Chronic pain    neck, back, b/l hips    Chronic pain of right hip 8/59/0931   Complication of anesthesia    DDD (degenerative disc disease), lumbar 01/06/2021   Depression    Facet arthritis of lumbar region 01/06/2021   GERD (gastroesophageal reflux disease)    History of Crohn's disease    Hyperlipidemia    Left ovarian cyst    s/p removal of 1 ovary ? which one removed per pt    Libido, decreased    Skin cancer    reports skin cancer removed from skin in 2016 or 2017   Thyroid nodule    repeat US resolved and Ct neck 05/2019 normal thyroid    Past Surgical History:  Procedure Laterality Date   ABDOMINAL HYSTERECTOMY     APPENDECTOMY  2004   BLADDER SURGERY     BREAST BIOPSY     x 2   COLONOSCOPY     COLONOSCOPY WITH PROPOFOL N/A 10/25/2020   Procedure: COLONOSCOPY WITH PROPOFOL;  Surgeon: Mauri Pole, MD;  Location: [facility];  Service: Endoscopy;  Laterality: N/A;   HEMOSTASIS CLIP PLACEMENT  10/25/2020   Procedure: HEMOSTASIS CLIP PLACEMENT;  Surgeon: Mauri Pole, MD;  Location: [facility];  Service: Endoscopy;;    OOPHORECTOMY Right    Unilateral Right (per MRI 04/17/2018)   POLYPECTOMY  10/25/2020   Procedure: POLYPECTOMY;  Surgeon: Mauri Pole, MD;  Location: [facility];  Service: Endoscopy;;   SUBMUCOSAL LIFTING INJECTION  10/25/2020   Procedure: SUBMUCOSAL LIFTING INJECTION;  Surgeon: Mauri Pole, MD;  Location: [facility];  Service: Endoscopy;;   WISDOM TOOTH EXTRACTION     Patient Active Problem List   Diagnosis Date Noted   Cervicalgia 07/01/2021   Low back pain 07/01/2021   Herpes infection 07/01/2021   Chronic pain of right hip 04/28/2021   DDD (degenerative disc disease), lumbar 01/06/2021   Facet arthritis of lumbar region 01/06/2021   Trochanteric bursitis of both hips 11/30/2020   Special screening for malignant neoplasms, colon    Polyp of cecum    Pain in both feet 04/12/2020   Burning sensation of feet 04/12/2020   Pain of both hip joints 04/12/2020   Onychomycosis 04/12/2020   Fibrocystic breast disease (FCBD) 04/12/2020   Numbness in both legs 09/09/2019   Spinal stenosis in cervical region 05/09/2019   Facet arthropathy, cervical 05/09/2019   Chronic pain 11/27/2018   Obesity (BMI 30.0-34.9) 11/27/2018  Sprain and strain of hip and thigh 05/10/2018   Abnormal MRI, cervical spine 03/28/2018   Menopause 11/26/2017   Right upper quadrant pain 11/26/2017   Abnormal Pap smear of cervix 07/27/2017   Vasomotor flushing 07/19/2017   Depression 07/18/2017   Nausea 06/25/2017   Chronic pain of both hips 06/25/2017   OSA (obstructive sleep apnea) 04/18/2017   Annual physical exam 04/10/2017   Fatigue 12/16/2016   Chronic back pain 05/30/2016   Abdominal pain 03/12/2016   Anxiety and depression 12/28/2015   History of obstructive sleep apnea 12/15/2015   GERD (gastroesophageal reflux disease) 12/15/2015   Hyperlipidemia 12/15/2015   Pain medication agreement signed 03/25/2015   CAD in native artery 02/05/2015   Right supraspinatus tenosynovitis  08/17/2014   Crohn's disease involving terminal ileum ([place]) 03/30/2014   FH: colon polyps 03/30/2014   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 female who presents to outpatient physical therapy with a referral for medical diagnosis tear of right gluteus medius tendon, trochanteric bursitis of both hips, enthesopathy of hip region on both sides. This patient's chief complaints consist of right hip pain, stiffness, weakness, abnormal gait, difficulty with balance, and dysfunction s/p OPEN REPAIR R GLUTEUS MEDIUS gluteal repair 12/05/2021, leading to the following functional deficits: difficulty with usual activities and basic mobility including household and community ambulation, transfers, walking her dogs, ADLs, IADLs, going to the beach, stairs, sleeping, carrying, lifting, bending, doing activities that require weight bearing without loss of balance, walking the dogs. 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  Patient denies hx of stroke, seizures, lung problems, diabetes, unexplained weight loss, unexplained changes in bowel or bladder problems, unexplained stumbling or dropping things,  osteoporosis, and spinal surgery.  PRECAUTIONS: Other: can transition to weight bearing as tolerated.  See protocol in chart.  SUBJECTIVE: Called patient back after she left a message that MD would like her to cancel remaining PT sessions. Patient states that she messaged her surgeon about how long she needs to come to PT and his office wrote back and said that she can stop PT and just do HEP as long as she can walk without a limp. She states she will follow up with him next month and they will address any additional concerns there.      OBJECTIVE Patient is not present for examination at this time. Please see previous documentation for latest objective data.      PATIENT EDUCATION:  Reviewed cancelation/no-show policy with patient and confirmed patient has correct phone number for clinic; patient verbalized understanding (02/02/22). Person educated: Patient Education method: Explanation.  Education comprehension: verbalized understanding   HOME EXERCISE PROGRAM: Access Code: [email or web address] URL: [email or web address] Date: 05/23/2022 Prepared by: Rosita Kea  Exercises - Staggered Sit-to-Stand  -  1 x daily - 3 sets - 10 reps - Supine Bridge with Resistance Band  - 1 x daily - 1 sets - 20 reps - 5 seconds hold - Standing Hip Abduction with Resistance at Ankles and Unilateral Counter Support  - 1 x daily - 3 sets - 10 reps - 5 second hold hold - Sidelying Hip Abduction  - 1 x daily - 3 sets - 10 reps - Side Step Overs with Cones and Unilateral Counter Support  - 1 x daily - 1-3 sets - 10 reps - Single Leg Stance  - 1 x daily - 3 sets - 15-30 secoonds hold - Runner's Step Up/Down  - 3 x weekly - 2-3 sets - 10 reps - Side Stepping with Resistance at Ankles  - 3 x weekly - 2 sets - 10-20 feet   ASSESSMENT:   CLINICAL IMPRESSION: Patient has attended 27 physical therapy sessions since starting current episode of care on 01/26/2022. Patient struggled to tolerate interventions  throughout episode of care, but was doing better with 1x a week appointments with very gradual progression of difficulty. Since last progress note, she was able to start walking without SPC with minimal antalgic gait but is still limited with activities that require prolonged walking or R single leg stance. She also has deficits in R LE strength, power, and endurance for change of direction and using stairs.  She is now being discharged per MD recommendation per patient after she asked them how long she should continue PT. Patient requested discharge after MD said she could continue HEP once she was able to ambulate without a limp. Patient has struggled due to pain and low activity tolerance to make further progress in the last few weeks as intensity was increased and is able to complete her currently tolerated exercises independently. Patient is now discharged per MD request and difficulty making progress while becoming independent with currently appropriate HEP.    OBJECTIVE IMPAIRMENTS Abnormal gait, decreased activity tolerance, decreased balance, decreased coordination, decreased endurance, decreased knowledge of use of DME, decreased mobility, difficulty walking, decreased ROM, decreased strength, impaired perceived functional ability, impaired flexibility, improper body mechanics, postural dysfunction, and pain.    ACTIVITY LIMITATIONS carrying, lifting, bending, sitting, standing, squatting, sleeping, stairs, transfers, bed mobility, dressing, hygiene/grooming, locomotion level, and caring for others   PARTICIPATION LIMITATIONS: meal prep, cleaning, laundry, interpersonal relationship, driving, shopping, community activity, and   basic mobility including household and community ambulation, transfers, walking her dogs, ADLs, IADLs, going to the beach, stairs, sleeping, carrying, lifting, bending, doing activities that require weight bearing without loss of balance, walking the dogs.   PERSONAL FACTORS  Past/current experiences, Time since onset of injury/illness/exacerbation, Transportation, and 3+ comorbidities:   chronic back pain, chronic neck pain, hypermobile in several joints, GERD, fatigue, depression/anxiety, CAD (smallest artery in heart - no stent recommended - sees cardiologist no restrictions), skin cancer (cleared up a few years ago), chronic pain patient, on keto diet for about 1.5 years are also affecting patient's functional outcome.    REHAB POTENTIAL: Good   CLINICAL DECISION MAKING: Stable/uncomplicated   EVALUATION COMPLEXITY: Low     GOALS: Goals reviewed with patient? No   SHORT TERM GOALS: Target date: 02/09/2022   Patient will be independent with initial home exercise program for self-management of symptoms. Baseline: Initial HEP to be provided at visit 2 as appropriate (01/26/22); participates as able but struggles to get reps/sets in due to fatigue/soreness (03/06/2022); Goal status: partially met  LONG TERM GOALS: Target date: 04/20/2022. Updated to 07/17/2022 for all unmet goals on 04/24/2022.    Patient will be independent with a long-term home exercise program for self-management of symptoms.  Baseline: Initial HEP to be provided at visit 2 as appropriate (01/26/22); participates as able but struggles to get reps/sets in due to fatigue/soreness (03/06/2022); participating well (04/24/2022); independent with current HEP (06/19/2022);  Goal status: MET   2.  Patient will demonstrate improved FOTO to equal or greater than 54 by visit #16 to demonstrate improvement in overall condition and self-reported functional ability.  Baseline: 30 (01/26/22); 58 at visit #10 (03/06/2022); 47 at visit #20 (04/24/2022);  Goal status: not met at end of episode of care 06/19/2022   3.  Patient will demonstrate R sided single leg stance of equal or greater than 30 seconds with no trendelenburg compensation to decrease fall risk and improve household and community ambulation.   Baseline: 0 seconds (01/26/22); 20 seconds with significant trendelenburg, unable to hold without trendelenburg (03/06/2022); 20 seconds with mild trendelenburg, (04/24/2022); Goal status: Partially Met   4.  Patient will ambulate equal or greater than 1000 feet with no AD during 6 min walk test to improve community ambulation.  Baseline: not tested (01/26/22); 617 feet with SPC. Altered gait pattern with trendelenburg pattern on R hip. Increased soreness (03/06/2022); 759 feet with SPC. Altered gait pattern with trendelenburg pattern on R hip. No increased soreness (04/24/2022); Goal status: Partially met   5.  Patient will complete community, work and/or recreational activities without limitation due to current condition.  Baseline: difficulty with usual activities including basic mobility including household and community ambulation, transfers, walking her dogs, ADLs, IADLs, going to the beach, stairs, sleeping, carrying, lifting, bending, doing activities that require weight bearing without loss of balance(01/26/22); reports she still cannot walk the dogs, is able to go up and down a few steps, continues to have difficulty with similar limitations with mild improvement (03/06/2022); has started taking some short walks to end of driveway with dogs and husband to help with dogs as needed, able to take more steps without AD at home (04/24/2022);  Goal status: Partially met  6.  Patient will demonstrate B hip abduction MMT equal or greater than 4+/5 with no increase in pain to improve activity tolerance for walking her dog.  Baseline: R = 3/5 with pain, left not tested due to lack of tolerance for laying on R side (01/26/2022);  R = 4/5 with pain (patient also fatigues by 10 reps and is unable to do more than 2 sets of 10 reps of dynamic hip abduction), L = 4+/5 (03/06/2022);  R = 4/5 without pain but feels tired (04/24/2022);  Goal status: Partially met       PLAN: PT FREQUENCY: 1-2x/week   PT DURATION: 12  weeks   PLANNED INTERVENTIONS: Therapeutic exercises, Therapeutic activity, Neuromuscular re-education, Balance training, Gait training, Patient/Family education, Joint mobilization, Stair training, DME instructions, Aquatic Therapy, Dry Needling, Electrical stimulation, Spinal mobilization, Cryotherapy, Moist heat, Manual therapy, and Re-evaluation   PLAN FOR NEXT SESSION: Patient is now discharged from [place]. Graylon Good, PT, DPT 06/19/22, 12:27 PM  [facility] [address] [place], [place] [address] P: [phone] I F: [phone]

## 2022-06-19 NOTE — Telephone Encounter (Signed)
Called patient back after she left a message that MD would like her to cancel remaining PT sessions. Patient states that she messaged her surgeon about how long she needs to come to PT and his office wrote back and said that she can stop PT and just do HEP as long as she can walk without a limp. She states she will follow up with him next month and they will address any additional concerns there.   Everlean Alstrom. Graylon Good, PT, DPT 06/19/22, 12:14 PM  [facility] [address] [place],  [address] P: [phone] I F: [phone]

## 2022-06-20 ENCOUNTER — Ambulatory Visit: Payer: Managed Care, Other (non HMO) | Admitting: Physical Therapy

## 2022-06-26 ENCOUNTER — Encounter: Payer: Managed Care, Other (non HMO) | Admitting: Physical Therapy

## 2022-07-03 ENCOUNTER — Encounter: Payer: Managed Care, Other (non HMO) | Admitting: Physical Therapy

## 2022-07-04 ENCOUNTER — Ambulatory Visit: Payer: Managed Care, Other (non HMO) | Admitting: Internal Medicine

## 2022-07-11 ENCOUNTER — Encounter: Payer: Managed Care, Other (non HMO) | Admitting: Physical Therapy

## 2022-08-07 ENCOUNTER — Ambulatory Visit: Payer: Managed Care, Other (non HMO) | Attending: Orthopedic Surgery | Admitting: Physical Therapy

## 2022-08-07 DIAGNOSIS — M6281 Muscle weakness (generalized): Secondary | ICD-10-CM | POA: Insufficient documentation

## 2022-08-07 DIAGNOSIS — R262 Difficulty in walking, not elsewhere classified: Secondary | ICD-10-CM | POA: Diagnosis present

## 2022-08-07 DIAGNOSIS — M25551 Pain in right hip: Secondary | ICD-10-CM | POA: Diagnosis not present

## 2022-08-07 NOTE — Therapy (Unsigned)
OUTPATIENT PHYSICAL THERAPY EVALUATION   Patient Name: Carolyn Esparza MRN: [id number] DOB:Aug 10, 1965, 57 y.o., female Today's Date: 08/07/2022  END OF SESSION:   Past Medical History:  Diagnosis Date   Anxiety    ASCUS with positive high risk HPV cervical    CAD (coronary artery disease)    Chicken pox    Chronic pain    neck, back, b/l hips    Chronic pain of right hip 2/94/7654   Complication of anesthesia    DDD (degenerative disc disease), lumbar 01/06/2021   Depression    Facet arthritis of lumbar region 01/06/2021   GERD (gastroesophageal reflux disease)    History of Crohn's disease    Hyperlipidemia    Left ovarian cyst    s/p removal of 1 ovary ? which one removed per pt    Libido, decreased    Skin cancer    reports skin cancer removed from skin in 2016 or 2017   Thyroid nodule    repeat US resolved and Ct neck 05/2019 normal thyroid    Past Surgical History:  Procedure Laterality Date   ABDOMINAL HYSTERECTOMY     APPENDECTOMY  2004   BLADDER SURGERY     BREAST BIOPSY     x 2   COLONOSCOPY     COLONOSCOPY WITH PROPOFOL N/A 10/25/2020   Procedure: COLONOSCOPY WITH PROPOFOL;  Surgeon: Mauri Pole, MD;  Location: [facility];  Service: Endoscopy;  Laterality: N/A;   HEMOSTASIS CLIP PLACEMENT  10/25/2020   Procedure: HEMOSTASIS CLIP PLACEMENT;  Surgeon: Mauri Pole, MD;  Location: [facility];  Service: Endoscopy;;   OOPHORECTOMY Right    Unilateral Right (per MRI 04/17/2018)   POLYPECTOMY  10/25/2020   Procedure: POLYPECTOMY;  Surgeon: Mauri Pole, MD;  Location: [facility];  Service: Endoscopy;;   SUBMUCOSAL LIFTING INJECTION  10/25/2020   Procedure: SUBMUCOSAL LIFTING INJECTION;  Surgeon: Mauri Pole, MD;  Location: [facility];  Service: Endoscopy;;   WISDOM TOOTH EXTRACTION     Patient Active Problem List   Diagnosis Date Noted   Cervicalgia 07/01/2021   Low back pain 07/01/2021   Herpes infection 07/01/2021   Chronic  pain of right hip 04/28/2021   DDD (degenerative disc disease), lumbar 01/06/2021   Facet arthritis of lumbar region 01/06/2021   Trochanteric bursitis of both hips 11/30/2020   Special screening for malignant neoplasms, colon    Polyp of cecum    Pain in both feet 04/12/2020   Burning sensation of feet 04/12/2020   Pain of both hip joints 04/12/2020   Onychomycosis 04/12/2020   Fibrocystic breast disease (FCBD) 04/12/2020   Numbness in both legs 09/09/2019   Spinal stenosis in cervical region 05/09/2019   Facet arthropathy, cervical 05/09/2019   Chronic pain 11/27/2018   Obesity (BMI 30.0-34.9) 11/27/2018   Sprain and strain of hip and thigh 05/10/2018   Abnormal MRI, cervical spine 03/28/2018   Menopause 11/26/2017   Right upper quadrant pain 11/26/2017   Abnormal Pap smear of cervix 07/27/2017   Vasomotor flushing 07/19/2017   Depression 07/18/2017   Nausea 06/25/2017   Chronic pain of both hips 06/25/2017   OSA (obstructive sleep apnea) 04/18/2017   Annual physical exam 04/10/2017   Fatigue 12/16/2016   Chronic back pain 05/30/2016   Abdominal pain 03/12/2016   Anxiety and depression 12/28/2015   History of obstructive sleep apnea 12/15/2015   GERD (gastroesophageal reflux disease) 12/15/2015   Hyperlipidemia 12/15/2015   Pain medication agreement signed 03/25/2015  CAD in native artery 02/05/2015   Right supraspinatus tenosynovitis 08/17/2014   Crohn's disease involving terminal ileum ([place]) 03/30/2014   FH: colon polyps 03/30/2014   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  Rationale for Evaluation and Treatment: {HABREHAB:27488}  ONSET DATE: ***  SUBJECTIVE:   SUBJECTIVE STATEMENT: Patient states she is returning to PT at the  recommendation of her doctor after she was continuing to have trouble going up stairs due to weakness in her right hip. She notices her right hip starts hurting more when she gets to about 5K steps on her fitbit. She is known to this clinic and PT after completing PT before and after her R OPEN REPAIR R GLUTEUS MEDIUS gluteal repair on 12/05/2021. She had difficulty tolerating PT with prolonged time to improvement and had discharged in November per patient preference. She has been doing well walking without her AD for the first 5K steps per day. She does have pain and stiffness when she first stands up after prolonged sitting and has to stand to get her balance for a few min before walking. She has some numbness on her incision site.   PERTINENT HISTORY: Patient is a 57 y.o. female who presents to outpatient physical therapy with a referral for medical diagnosis tear of right gluteus medius tendon. This patient's chief complaints consist of right hip weakness and pain s/p OPEN REPAIR R GLUTEUS MEDIUS gluteal repair 12/05/2021, leading to the following functional deficits: ddifficulty with usual activities including those that involve ascending/descending stairs, walking more than 5K steps, exercising and being active, going to the beach, walking the dogs, transfers,carrying things, grocery shopping, lifting, heavy cleaning, cooking. 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  Patient denies hx of stroke, seizures, lung problems, diabetes, unexplained weight loss, unexplained changes in bowel or bladder problems, unexplained stumbling or dropping things, osteoporosis, and spinal surgery.   PAIN:  Are you having  pain? Yes: NPRS scale: Current: 0/10,  Best: 0/10, Worst: 7/10. Pain location: R lateral hip and along iliac crest.  Pain description: achy, Aggravating factors: ascending/descending stairs, walking more than 5K steps,  Relieving factors: hot bath, resting, heating pad  FUNCTIONAL LIMITATIONS: difficulty with usual activities including those that involve ascending/descending stairs, walking more than 5K steps, exercising and being active, going to the beach, walking the dogs, transfers,carrying things, grocery shopping, lifting, heavy cleaning, cooking.   PRECAUTIONS: None official, but "be careful" because he had another patient who went to the weight training at the gym and re-tore it.   WEIGHT BEARING RESTRICTIONS: No  FALLS:  Has patient fallen in last 6 months? 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   PLOF: Independent  LEISURE: walk her dogs, go up and down steps, go to the beach, ride in a convertible car, read a book, go to the theater.   PATIENT GOALS: "to be pain free in my hip while doing all of my activities"   NEXT MD VISIT: June 2024  OBJECTIVE  DIAGNOSTIC FINDINGS:  R hip MRI report from 1014/2022: "IMPRESSION: 1. No hip fracture, dislocation or avascular necrosis. 2. Mild tendinosis of the right gluteus minimus and medius tendon insertion. 3. Mild tendinosis of the left gluteus medius and minimus tendon insertion."  SELF- REPORTED FUNCTION FOTO score: ***/100 (hip questionnaire)  OBSERVATION/INSPECTION Posture Posture (seated): forward head, rounded shoulders, slumped in sitting.  Posture (standing): *** Posture correction: *** Anthropometrics Tremor: none Body composition: *** Muscle bulk: *** Skin: The incision sites appear to be healing well with no excessive redness, warmth, drainage or signs of infection present.  *** Edema: *** Functional Mobility Bed mobility: *** Transfers: *** Gait: grossly WFL  for household and short community ambulation. More detailed gait analysis deferred to later date as needed. *** Stairs: ***   PERIPHERAL JOINT MOTION (in degrees)  ACTIVE RANGE OF MOTION (AROM) *Indicates pain Date Date Date  Joint/Motion R/L R/L R/L  Shoulder     Flexion / / /  Extension / / /  Abduction  / / /  External rotation / / /  Internal rotation / / /  Elbow     Flexion  / / /  Extension  / / /  Wrist     Flexion / / /  Extension  / / /  Radial deviation / / /  Ulnar deviation / / /  Pronation / / /  Supination / / /  Hip     Flexion / / /  Extension  / / /  Abduction / / /  Adduction / / /  External rotation / / /  Internal rotation  / / /  Knee     Extension / / /  Flexoin / / /  Ankle/Foot     Dorsiflexion (knee ext) / / /  Dorsiflexion (knee flex) / / /  Plantarflexion / / /  Everison / / /  Inversion / / /  Great toe extension / / /  Great toe flexion / / /  Comments:   PASSIVE RANGE OF MOTION (PROM) *Indicates pain Date Date Date  Joint/Motion R/L R/L R/L  Shoulder     Flexion / / /  Extension / / /  Abduction  / / /  External rotation / / /  Internal rotation / / /  Elbow     Flexion  / / /  Extension  / / /  Wrist     Flexion / / /  Extension  / / /  Radial deviation / / /  Ulnar deviation / / /  Pronation / / /  Supination / / /  Hip     Flexion  / / /  Extension  / / /  Abduction / / /  Adduction / / /  External rotation / / /  Internal rotation  / / /  Knee     Extension / / /  Flexoin / / /  Ankle/Foot     Dorsiflexion (knee ext) / / /  Dorsiflexion (knee flex) / / /  Plantarflexion / / /  Everison / / /  Inversion / / /  Great toe extension / / /  Great toe flexion / / /  Comments:   MUSCLE PERFORMANCE (MMT):  *Indicates pain 08/07/21 Date Date  Joint/Motion R/L R/L R/L  Hip     Flexion (L1, L2) 4+/4 / /  Extension (knee ext) 4/4 / /  Abduction 4*/4+ / /  External rotation 3*/4 / /  Internal rotation   3-/4+ / /  Knee     Extension (L3) 5/5 / /  Flexion (S2) 5/5 / /  Ankle/Foot     Dorsiflexion (L4) 5/5 / /  Great toe extension (L5) 4+/4+ / /  Eversion (S1) 5/5 / /  Plantarflexion (S1) 4/4 / /  Comments:   SPECIAL TESTS:  ACCESSORY MOTION: ***  PALPATION: ***  SUSTAINED POSITIONS TESTING:  ***  REPEATED MOTIONS TESTING: ***  FUNCTIONAL/BALANCE TESTS:  Single leg stance, firm surface, eyes open: R= 25 seconds increased trendelenburg (right trendelenburg), L= 59 seconds (back pain limiting)   Five Time Sit to Stand (5TSTS): @@ seconds Functional Gait Assessment (FGA): @@/30 (see details above) Ten meter walking trial (10MWT): @@ meters/second Six Minute Walk Test (6MWT): @@ feet Timed Up and Go (TUG): @'@seconds'$    Dynamic Gait Index: @@/24 BERG Balance Scale: @@/56 Tinetti/POMA: @@/28 Timed Up and GO: @@ seconds (average of 3 trials) Trial 1: @@ Trial 2: @@ Trial 3: @@ Romberg test: -Narrow stance, eyes open: @@ seconds -Narrow stance, eyes closed: @@ seconds Sharpened Romberg test: -Tandem stance, eyes open: @@ seconds -Tandem stance, eyes closed: @@ seconds  Narrow stance, firm surface, eyes open: @@ seconds Narrow stance, firm surface, eyes closed: @@ seconds Narrow stance, compliant surface, eyes open: @@ seconds Narrow stance, compliant surface, eyes closed: @@ seconds        Single leg stance, compliant surface, eyes open: R= @@ seconds, L= @@ seconds Gait speed: @@ m/s Functional reach test: @@ inches      TODAY'S TREATMENT:                                                                                                                              DATE: ***    PATIENT EDUCATION:  Education details: *** Person educated: {Person educated:25204} Education method: {Education Method:25205} Education comprehension: {Education Comprehension:25206}  HOME EXERCISE PROGRAM: ***  ASSESSMENT:  CLINICAL IMPRESSION: Patient is a *** y.o.  *** who was seen today for physical therapy evaluation and treatment for ***.   OBJECTIVE IMPAIRMENTS: {opptimpairments:25111}.   ACTIVITY LIMITATIONS: {activitylimitations:27494}  PARTICIPATION LIMITATIONS: {participationrestrictions:25113}  PERSONAL FACTORS: {Personal factors:25162} are also affecting patient's functional outcome.   REHAB POTENTIAL: {rehabpotential:25112}  CLINICAL DECISION MAKING: {clinical decision making:25114}  EVALUATION COMPLEXITY: {Evaluation complexity:25115}   GOALS: Goals reviewed with patient? {yes/no:20286}  SHORT TERM GOALS: Target date: *** *** Baseline: Goal status: {GOALSTATUS:25110}  2.  *** Baseline:  Goal status: {GOALSTATUS:25110}  3.  *** Baseline:  Goal status: {GOALSTATUS:25110}  4.  *** Baseline:  Goal status: {GOALSTATUS:25110}  5.  *** Baseline:  Goal status: {GOALSTATUS:25110}  6.  *** Baseline:  Goal status: {GOALSTATUS:25110}  LONG TERM GOALS: Target date: ***  *** Baseline:  Goal status: {GOALSTATUS:25110}  2.  *** Baseline:  Goal status: {GOALSTATUS:25110}  3.  *** Baseline:  Goal status: {GOALSTATUS:25110}  4.  *** Baseline:  Goal status: {  GOALSTATUS:25110}  5.  *** Baseline:  Goal status: {GOALSTATUS:25110}  6.  *** Baseline:  Goal status: {GOALSTATUS:25110}   PLAN:  PT FREQUENCY: {rehab frequency:25116}  PT DURATION: {rehab duration:25117}  PLANNED INTERVENTIONS: {rehab planned interventions:25118::"Therapeutic exercises","Therapeutic activity","Neuromuscular re-education","Balance training","Gait training","Patient/Family education","Self Care","Joint mobilization"}  PLAN FOR NEXT SESSION: ***   Nancy Nordmann, PT 08/07/2022, 5:37 PM

## 2022-08-08 ENCOUNTER — Encounter: Payer: Self-pay | Admitting: Physical Therapy

## 2022-08-10 ENCOUNTER — Ambulatory Visit: Payer: Managed Care, Other (non HMO) | Admitting: Physical Therapy

## 2022-08-10 ENCOUNTER — Encounter: Payer: Self-pay | Admitting: Physical Therapy

## 2022-08-10 DIAGNOSIS — M25551 Pain in right hip: Secondary | ICD-10-CM | POA: Diagnosis not present

## 2022-08-10 DIAGNOSIS — R262 Difficulty in walking, not elsewhere classified: Secondary | ICD-10-CM

## 2022-08-10 DIAGNOSIS — M6281 Muscle weakness (generalized): Secondary | ICD-10-CM

## 2022-08-10 NOTE — Therapy (Signed)
OUTPATIENT PHYSICAL THERAPY TREATMENT NOTE   Patient Name: Carolyn Esparza MRN: [id number] DOB:March 25, 1966, 57 y.o., female Today's Date: 08/10/2022  PCP: Carollee Leitz, MD REFERRING PROVIDER: Charlcie Cradle, MD  END OF SESSION:   PT End of Session - 08/10/22 1719     Visit Number 2    Number of Visits 24    Date for PT Re-Evaluation 10/30/22    Authorization Type [employer] reporting period from 04/24/2022    Authorization Time Period VL: 42 per calendar year    Authorization - Visit Number 2    Authorization - Number of Visits 60    Progress Note Due on Visit 10    PT Start Time 5462    PT Stop Time 1728    PT Time Calculation (min) 38 min    Activity Tolerance Patient tolerated treatment well;Patient limited by fatigue;Patient limited by pain    Behavior During Therapy [facility] for tasks assessed/performed             Past Medical History:  Diagnosis Date   Anxiety    ASCUS with positive high risk HPV cervical    CAD (coronary artery disease)    Chicken pox    Chronic pain    neck, back, b/l hips    Chronic pain of right hip 01/31/5008   Complication of anesthesia    DDD (degenerative disc disease), lumbar 01/06/2021   Depression    Facet arthritis of lumbar region 01/06/2021   GERD (gastroesophageal reflux disease)    History of Crohn's disease    Hyperlipidemia    Left ovarian cyst    s/p removal of 1 ovary ? which one removed per pt    Libido, decreased    Skin cancer    reports skin cancer removed from skin in 2016 or 2017   Thyroid nodule    repeat US resolved and Ct neck 05/2019 normal thyroid    Past Surgical History:  Procedure Laterality Date   ABDOMINAL HYSTERECTOMY     APPENDECTOMY  2004   BLADDER SURGERY     BREAST BIOPSY     x 2   COLONOSCOPY     COLONOSCOPY WITH PROPOFOL N/A 10/25/2020   Procedure: COLONOSCOPY WITH PROPOFOL;  Surgeon: Mauri Pole, MD;  Location: [facility];  Service: Endoscopy;  Laterality: N/A;   HEMOSTASIS CLIP PLACEMENT   10/25/2020   Procedure: HEMOSTASIS CLIP PLACEMENT;  Surgeon: Mauri Pole, MD;  Location: [facility];  Service: Endoscopy;;   OOPHORECTOMY Right    Unilateral Right (per MRI 04/17/2018)   POLYPECTOMY  10/25/2020   Procedure: POLYPECTOMY;  Surgeon: Mauri Pole, MD;  Location: [facility];  Service: Endoscopy;;   SUBMUCOSAL LIFTING INJECTION  10/25/2020   Procedure: SUBMUCOSAL LIFTING INJECTION;  Surgeon: Mauri Pole, MD;  Location: [facility];  Service: Endoscopy;;   WISDOM TOOTH EXTRACTION     Patient Active Problem List   Diagnosis Date Noted   Cervicalgia 07/01/2021   Low back pain 07/01/2021   Herpes infection 07/01/2021   Chronic pain of right hip 04/28/2021   DDD (degenerative disc disease), lumbar 01/06/2021   Facet arthritis of lumbar region 01/06/2021   Trochanteric bursitis of both hips 11/30/2020   Special screening for malignant neoplasms, colon    Polyp of cecum    Pain in both feet 04/12/2020   Burning sensation of feet 04/12/2020   Pain of both hip joints 04/12/2020   Onychomycosis 04/12/2020   Fibrocystic breast disease (FCBD) 04/12/2020  Numbness in both legs 09/09/2019   Spinal stenosis in cervical region 05/09/2019   Facet arthropathy, cervical 05/09/2019   Chronic pain 11/27/2018   Obesity (BMI 30.0-34.9) 11/27/2018   Sprain and strain of hip and thigh 05/10/2018   Abnormal MRI, cervical spine 03/28/2018   Menopause 11/26/2017   Right upper quadrant pain 11/26/2017   Abnormal Pap smear of cervix 07/27/2017   Vasomotor flushing 07/19/2017   Depression 07/18/2017   Nausea 06/25/2017   Chronic pain of both hips 06/25/2017   OSA (obstructive sleep apnea) 04/18/2017   Annual physical exam 04/10/2017   Fatigue 12/16/2016   Chronic back pain 05/30/2016   Abdominal pain 03/12/2016   Anxiety and depression 12/28/2015   History of obstructive sleep apnea 12/15/2015   GERD (gastroesophageal reflux disease) 12/15/2015   Hyperlipidemia  12/15/2015   Pain medication agreement signed 03/25/2015   CAD in native artery 02/05/2015   Right supraspinatus tenosynovitis 08/17/2014   Crohn's disease involving terminal ileum ([place]) 03/30/2014   FH: colon polyps 03/30/2014   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 female who presents to outpatient physical therapy with a referral for medical diagnosis tear of right gluteus medius tendon. This patient's chief complaints consist of right hip weakness and pain s/p OPEN REPAIR R GLUTEUS MEDIUS gluteal repair 12/05/2021, leading to the following functional deficits: difficulty with usual activities including those that involve ascending/descending stairs, walking more than 5K steps, exercising and being active, going to the beach, walking the dogs, transfers,carrying things, grocery shopping, lifting, heavy cleaning, cooking. 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  Patient denies hx of stroke, seizures, lung problems, diabetes, unexplained weight loss, unexplained changes in bowel or bladder problems, unexplained stumbling or dropping things, osteoporosis, and spinal surgery.    PRECAUTIONS: None official, but "be careful" because he had another patient who went to the weight training at the gym and re-tore  it.   SUBJECTIVE:  SUBJECTIVE STATEMENT:  Patient reports she is feeling well today except that her arms are hurting. She states she was sore  the day after last PT session, so she did not do any of her HEP that day. She did the exercises yesterday and it was okay.    PAIN:  Are you having pain? NPRS 0/10 R hip   OBJECTIVE:   TODAY'S TREATMENT:  Therapeutic exercise: to centralize symptoms and improve ROM, strength, muscular endurance, and activity tolerance required for successful completion of functional activities.  - sidelying R hip abduction to tap in front then behind left leg, 2x8.  - step up to 8 inch step with right foot leading up and L foot leading down.  - forwards deadmill, 1x15 seconds before it "started talking to me" and she had to stop due to pain that resolved with rest.  - lateral push on treadmill turned off, 2x1 min with each foot on treadmill.  - standing AROM hip abduction, 2x10 each side. 2 second hold. Long rest between.  - Education on HEP including online handout    Pt required multimodal cuing for proper technique and to facilitate improved neuromuscular control, strength, range of motion, and functional ability resulting in improved performance and form.     PATIENT EDUCATION:  Education details: Exercise purpose/form. Self management techniques. Education on HEP including handout.  Reviewed cancelation/no-show policy with patient and confirmed patient has correct phone number for clinic; patient verbalized understanding (08/07/22). Person educated: Patient Education method: Explanation, Demonstration, Tactile cues, and Verbal cues Education comprehension: verbalized understanding, returned demonstration, verbal cues required, and needs further education   HOME EXERCISE  PROGRAM: Access Code: [email or web address] URL: [email or web address] Date: 08/10/2022 Prepared by: Rosita Kea  Exercises - Sidelying Hip Abduction  - 1 x daily - 2-3 sets - 8 reps - Standing Hip Abduction with Counter Support  - 1 x daily - 2-3 sets - 10 reps - 2 seconds hold - Step Up  - 1 x daily - 2-3 sets - 10 reps   ASSESSMENT:   CLINICAL IMPRESSION: Patient arrives with moderately good tolerance to last treatment session and good tolerance to HEP as performed. Patient continued with interventions today for strengthening right hip, with special attention to hip abduction, extension, and functional strength. Patient required prolonged rest breaks and was not able to tolerate deadmill exercise due to pain. Patient reported feeling fatigue with most exercise and was no worse by end of session. Plan to continue with progressive strengthening as tolerated next session. Patient would benefit from continued management of limiting condition by skilled physical therapist to address remaining impairments and functional limitations to work towards stated goals and return to PLOF or maximal functional independence.   From Initial Eval 08/07/2021:  Patient is a 57 y.o. female referred to outpatient physical therapy with a medical diagnosis of tear of right gluteus medius tendon who presents with signs and symptoms consistent with chronic R hip pain and weakness s/p R OPEN REPAIR R GLUTEUS MEDIUS gluteal repair on 12/05/2021. Patient presents with significant pain, muscle performance (strength/power/endurance), motor control, balance, and activity tolerance impairments that are limiting ability to complete her usual activities including those that involve ascending/descending stairs, walking more than 5K steps, exercising and being active, going to the beach, walking the dogs, transfers,carrying things, grocery shopping, lifting, heavy cleaning, cooking without difficulty. Patient has previously had  difficulty tolerating strengthening progressions except when progressed very slowly. Recommend visit frequency of 1x per week as tolerated by patient. Patient  will benefit from skilled physical therapy intervention to address current body structure impairments and activity limitations to improve function and work towards goals set in current POC in order to return to prior level of function or maximal functional improvement.      OBJECTIVE IMPAIRMENTS: Abnormal gait, decreased activity tolerance, decreased balance, decreased coordination, decreased endurance, decreased knowledge of condition, decreased mobility, difficulty walking, decreased strength, impaired perceived functional ability, increased muscle spasms, improper body mechanics, and pain.    ACTIVITY LIMITATIONS: carrying, lifting, bending, standing, squatting, sleeping, stairs, and transfers   PARTICIPATION LIMITATIONS: meal prep, cleaning, laundry, interpersonal relationship, shopping, community activity, and   usual activities including those that involve ascending/descending stairs, walking more than 5K steps, exercising and being active, going to the beach, walking the dogs, transfers,carrying things, grocery shopping, lifting, heavy cleaning, cooking   PERSONAL FACTORS: Fitness, Past/current experiences, Time since onset of injury/illness/exacerbation, and 3+ comorbidities:   chronic back pain, chronic neck pain, hypermobile in several joints, GERD, fatigue, depression/anxiety, CAD (smallest artery in heart - no stent recommended - sees cardiologist no restrictions), skin cancer (cleared up a few years ago), chronic pain patient, on keto diet for about 1.5 years are also affecting patient's functional outcome.    REHAB POTENTIAL: Good   CLINICAL DECISION MAKING: Evolving/moderate complexity   EVALUATION COMPLEXITY: Moderate     GOALS: Goals reviewed with patient? No   SHORT TERM GOALS: Target date: 08/22/2022   Patient will be  independent with initial home exercise program for self-management of symptoms. Baseline: Initial HEP provided at IE (08/07/22); Goal status: MET     LONG TERM GOALS: Target date: 10/30/2022   Patient will be independent with a long-term home exercise program for self-management of symptoms.  Baseline: Initial HEP provided at IE (08/07/22); Goal status: In-progress   2.  Patient will demonstrate improved FOTO to equal or greater than 57 by visit #15 to demonstrate improvement in overall condition and self-reported functional ability.  Baseline: 37 (08/07/22); Goal status: In-progress   3.  Patient will complete 1x10 consecutive single leg runner's step up on R LE to 8 inch or higher step with no UE support or increase in pain to demonstrate improved strength and balance to climb stairs.  Baseline: pain with single step up to 6 inch step (08/07/22); Goal status: In-progress   4.  Patient will balance equal or greater than 60 seconds on R single leg stance without trendelenburg to demonstrate improved functional strength of right lateral hip for improved walking.  Baseline: 25 seconds with trendelenburg (08/07/22); Goal status: In-progress   5.  Patient will complete community, work and/or recreational activities without limitation due to current condition.  Baseline: difficulty with usual activities including those that involve ascending/descending stairs, walking more than 5K steps, exercising and being active, going to the beach, walking the dogs, transfers,carrying things, grocery shopping, lifting, heavy cleaning, cooking (08/07/22); Goal status: In-progress     PLAN:   PT FREQUENCY: 1-2x/week   PT DURATION: 12 weeks   PLANNED INTERVENTIONS: Therapeutic exercises, Therapeutic activity, Neuromuscular re-education, Balance training, Gait training, Patient/Family education, Self Care, Joint mobilization, Stair training, DME instructions, Dry Needling, Electrical stimulation, Spinal  mobilization, Cryotherapy, Moist heat, Manual therapy, and Re-evaluation   PLAN FOR NEXT SESSION: gently progressive strengthening focusing on R hip and functional strength, balance, manual therapy as needed.      Nancy Nordmann, PT, DPT 08/10/2022, 7:39 PM   [facility] [address]  [place], [place] [address] P: [phone] I F: [phone]

## 2022-08-16 ENCOUNTER — Ambulatory Visit: Payer: Managed Care, Other (non HMO) | Admitting: Physical Therapy

## 2022-08-16 ENCOUNTER — Encounter: Payer: Self-pay | Admitting: Physical Therapy

## 2022-08-16 DIAGNOSIS — R262 Difficulty in walking, not elsewhere classified: Secondary | ICD-10-CM

## 2022-08-16 DIAGNOSIS — M25551 Pain in right hip: Secondary | ICD-10-CM

## 2022-08-16 DIAGNOSIS — M6281 Muscle weakness (generalized): Secondary | ICD-10-CM

## 2022-08-16 NOTE — Therapy (Signed)
OUTPATIENT PHYSICAL THERAPY TREATMENT NOTE   Patient Name: Carolyn Esparza MRN: [id number] DOB:1966/07/06, 57 y.o., female Today's Date: 08/16/2022  PCP: Carollee Leitz, MD REFERRING PROVIDER: Charlcie Cradle, MD  END OF SESSION:   PT End of Session - 08/16/22 1132     Visit Number 3    Number of Visits 24    Date for PT Re-Evaluation 10/30/22    Authorization Type [employer] reporting period from 04/24/2022    Authorization Time Period VL: 34 per calendar year    Authorization - Visit Number 3    Authorization - Number of Visits 60    Progress Note Due on Visit 10    PT Start Time 1127    PT Stop Time 1207    PT Time Calculation (min) 40 min    Activity Tolerance Patient tolerated treatment well;Patient limited by fatigue;Patient limited by pain    Behavior During Therapy [facility] for tasks assessed/performed              Past Medical History:  Diagnosis Date   Anxiety    ASCUS with positive high risk HPV cervical    CAD (coronary artery disease)    Chicken pox    Chronic pain    neck, back, b/l hips    Chronic pain of right hip 6/31/4970   Complication of anesthesia    DDD (degenerative disc disease), lumbar 01/06/2021   Depression    Facet arthritis of lumbar region 01/06/2021   GERD (gastroesophageal reflux disease)    History of Crohn's disease    Hyperlipidemia    Left ovarian cyst    s/p removal of 1 ovary ? which one removed per pt    Libido, decreased    Skin cancer    reports skin cancer removed from skin in 2016 or 2017   Thyroid nodule    repeat US resolved and Ct neck 05/2019 normal thyroid    Past Surgical History:  Procedure Laterality Date   ABDOMINAL HYSTERECTOMY     APPENDECTOMY  2004   BLADDER SURGERY     BREAST BIOPSY     x 2   COLONOSCOPY     COLONOSCOPY WITH PROPOFOL N/A 10/25/2020   Procedure: COLONOSCOPY WITH PROPOFOL;  Surgeon: Mauri Pole, MD;  Location: [facility];  Service: Endoscopy;  Laterality: N/A;   HEMOSTASIS CLIP  PLACEMENT  10/25/2020   Procedure: HEMOSTASIS CLIP PLACEMENT;  Surgeon: Mauri Pole, MD;  Location: [facility];  Service: Endoscopy;;   OOPHORECTOMY Right    Unilateral Right (per MRI 04/17/2018)   POLYPECTOMY  10/25/2020   Procedure: POLYPECTOMY;  Surgeon: Mauri Pole, MD;  Location: [facility];  Service: Endoscopy;;   SUBMUCOSAL LIFTING INJECTION  10/25/2020   Procedure: SUBMUCOSAL LIFTING INJECTION;  Surgeon: Mauri Pole, MD;  Location: [facility];  Service: Endoscopy;;   WISDOM TOOTH EXTRACTION     Patient Active Problem List   Diagnosis Date Noted   Cervicalgia 07/01/2021   Low back pain 07/01/2021   Herpes infection 07/01/2021   Chronic pain of right hip 04/28/2021   DDD (degenerative disc disease), lumbar 01/06/2021   Facet arthritis of lumbar region 01/06/2021   Trochanteric bursitis of both hips 11/30/2020   Special screening for malignant neoplasms, colon    Polyp of cecum    Pain in both feet 04/12/2020   Burning sensation of feet 04/12/2020   Pain of both hip joints 04/12/2020   Onychomycosis 04/12/2020   Fibrocystic breast disease (FCBD) 04/12/2020  Numbness in both legs 09/09/2019   Spinal stenosis in cervical region 05/09/2019   Facet arthropathy, cervical 05/09/2019   Chronic pain 11/27/2018   Obesity (BMI 30.0-34.9) 11/27/2018   Sprain and strain of hip and thigh 05/10/2018   Abnormal MRI, cervical spine 03/28/2018   Menopause 11/26/2017   Right upper quadrant pain 11/26/2017   Abnormal Pap smear of cervix 07/27/2017   Vasomotor flushing 07/19/2017   Depression 07/18/2017   Nausea 06/25/2017   Chronic pain of both hips 06/25/2017   OSA (obstructive sleep apnea) 04/18/2017   Annual physical exam 04/10/2017   Fatigue 12/16/2016   Chronic back pain 05/30/2016   Abdominal pain 03/12/2016   Anxiety and depression 12/28/2015   History of obstructive sleep apnea 12/15/2015   GERD (gastroesophageal reflux disease) 12/15/2015    Hyperlipidemia 12/15/2015   Pain medication agreement signed 03/25/2015   CAD in native artery 02/05/2015   Right supraspinatus tenosynovitis 08/17/2014   Crohn's disease involving terminal ileum ([place]) 03/30/2014   FH: colon polyps 03/30/2014   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 female who presents to outpatient physical therapy with a referral for medical diagnosis tear of right gluteus medius tendon. This patient's chief complaints consist of right hip weakness and pain s/p OPEN REPAIR R GLUTEUS MEDIUS gluteal repair 12/05/2021, leading to the following functional deficits: difficulty with usual activities including those that involve ascending/descending stairs, walking more than 5K steps, exercising and being active, going to the beach, walking the dogs, transfers,carrying things, grocery shopping, lifting, heavy cleaning, cooking. 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  Patient denies hx of stroke, seizures, lung problems, diabetes, unexplained weight loss, unexplained changes in bowel or bladder problems, unexplained stumbling or dropping things, osteoporosis, and spinal surgery.    PRECAUTIONS: None official, but "be careful" because he had another patient who went to the weight training at the gym  and re-tore it.   SUBJECTIVE:  SUBJECTIVE STATEMENT:  Patient reports she did well after the last PT session but she was sore the next day so she did not do HEP that day (Friday). She did exercises despite soreness on Saturday. She went shopping on Sunday and did 7,785 steps so she did not do HEP. She did her HEP Monday and Yesterday (did better yesterday).    PAIN:  Are you having pain? NPRS 2/10 R hip "not bad"    OBJECTIVE:   TODAY'S TREATMENT:  Therapeutic exercise: to centralize symptoms and improve ROM, strength, muscular endurance, and activity tolerance required for successful completion of functional activities.  - lateral stepping over small aerobic step, 3x10 at AROM/2/2# AW.  - wall side plank. 3x30 seconds each side, attempted with hip abduction last two set with R leg on top (unable to tolerate with R leg supporting).  - squat to tap on airex pad on 17 inch chair with RTB loop pulling laterally, 1x10 each side.  - squat to tap on airex pad on 17 inch chair to overhead press using 3#DB in each hand, with RTB loop pulling laterally, 2x10 each side.  - lateral push on treadmill turned off, 1x30 seconds standing on R side, discontinued with standing on left side due to cramping in right glute med. - modified hollow hold with B LE in table top and shoulders flexed 90 degrees with full IR, 5x20 seconds.    Pt required multimodal cuing for proper technique and to facilitate improved neuromuscular control, strength, range of motion, and functional ability resulting in improved performance and form.     PATIENT EDUCATION:  Education details: Exercise purpose/form. Self management techniques. Education on HEP including handout.  Reviewed cancelation/no-show policy with patient and confirmed patient has  correct phone number for clinic; patient verbalized understanding (08/07/22). Person educated: Patient Education method: Explanation, Demonstration, Tactile cues, and Verbal cues Education comprehension: verbalized understanding, returned demonstration, verbal cues required, and needs further education   HOME EXERCISE PROGRAM: Access Code: [email or web address] URL: [email or web address] Date: 08/16/2022 Prepared by: Rosita Kea  Exercises - Sidelying Hip Abduction  - 1 x daily - 2-3 sets - 8 reps - Standing Hip Abduction with Counter Support  - 1 x daily - 2-3 sets - 10 reps - 2 seconds hold - Step Up  - 1 x daily - 2-3 sets - 10 reps - The Hundred 3 Intermediate - Table Top  - 1 x daily - 5 reps - 20 seconds hold   ASSESSMENT:   CLINICAL IMPRESSION: Patient arrives with report of good tolerance to last PT session and sufficient participation in her HEP. Continued with varied interventions targeting loading and strengthening of right lateral hip as well as core/functional strengthening to improve function and quality of life. Patient continues to demonstrate low activity tolerance and is quick to have pain and fatigue in right lateral hip. Sessions continue to work towards improving work capacity and improve general physical condition as well as right hip strength to improve quality of life and reduce likelihood of regression following PT. Patient would benefit from continued management of limiting condition by skilled physical therapist to address remaining impairments and functional limitations to work towards stated goals and return to PLOF or maximal functional independence.   From Initial Eval 08/07/2021:  Patient is a 57 y.o. female referred to outpatient physical therapy with a medical diagnosis of tear of right gluteus medius tendon who presents with signs and symptoms consistent with chronic R hip pain and weakness s/p R OPEN  REPAIR R GLUTEUS MEDIUS gluteal repair on 12/05/2021. Patient  presents with significant pain, muscle performance (strength/power/endurance), motor control, balance, and activity tolerance impairments that are limiting ability to complete her usual activities including those that involve ascending/descending stairs, walking more than 5K steps, exercising and being active, going to the beach, walking the dogs, transfers,carrying things, grocery shopping, lifting, heavy cleaning, cooking without difficulty. Patient has previously had difficulty tolerating strengthening progressions except when progressed very slowly. Recommend visit frequency of 1x per week as tolerated by patient. Patient will benefit from skilled physical therapy intervention to address current body structure impairments and activity limitations to improve function and work towards goals set in current POC in order to return to prior level of function or maximal functional improvement.      OBJECTIVE IMPAIRMENTS: Abnormal gait, decreased activity tolerance, decreased balance, decreased coordination, decreased endurance, decreased knowledge of condition, decreased mobility, difficulty walking, decreased strength, impaired perceived functional ability, increased muscle spasms, improper body mechanics, and pain.    ACTIVITY LIMITATIONS: carrying, lifting, bending, standing, squatting, sleeping, stairs, and transfers   PARTICIPATION LIMITATIONS: meal prep, cleaning, laundry, interpersonal relationship, shopping, community activity, and   usual activities including those that involve ascending/descending stairs, walking more than 5K steps, exercising and being active, going to the beach, walking the dogs, transfers,carrying things, grocery shopping, lifting, heavy cleaning, cooking   PERSONAL FACTORS: Fitness, Past/current experiences, Time since onset of injury/illness/exacerbation, and 3+ comorbidities:   chronic back pain, chronic neck pain, hypermobile in several joints, GERD, fatigue,  depression/anxiety, CAD (smallest artery in heart - no stent recommended - sees cardiologist no restrictions), skin cancer (cleared up a few years ago), chronic pain patient, on keto diet for about 1.5 years are also affecting patient's functional outcome.    REHAB POTENTIAL: Good   CLINICAL DECISION MAKING: Evolving/moderate complexity   EVALUATION COMPLEXITY: Moderate     GOALS: Goals reviewed with patient? No   SHORT TERM GOALS: Target date: 08/22/2022   Patient will be independent with initial home exercise program for self-management of symptoms. Baseline: Initial HEP provided at IE (08/07/22); Goal status: MET     LONG TERM GOALS: Target date: 10/30/2022   Patient will be independent with a long-term home exercise program for self-management of symptoms.  Baseline: Initial HEP provided at IE (08/07/22); Goal status: In-progress   2.  Patient will demonstrate improved FOTO to equal or greater than 57 by visit #15 to demonstrate improvement in overall condition and self-reported functional ability.  Baseline: 37 (08/07/22); Goal status: In-progress   3.  Patient will complete 1x10 consecutive single leg runner's step up on R LE to 8 inch or higher step with no UE support or increase in pain to demonstrate improved strength and balance to climb stairs.  Baseline: pain with single step up to 6 inch step (08/07/22); Goal status: In-progress   4.  Patient will balance equal or greater than 60 seconds on R single leg stance without trendelenburg to demonstrate improved functional strength of right lateral hip for improved walking.  Baseline: 25 seconds with trendelenburg (08/07/22); Goal status: In-progress   5.  Patient will complete community, work and/or recreational activities without limitation due to current condition.  Baseline: difficulty with usual activities including those that involve ascending/descending stairs, walking more than 5K steps, exercising and being active,  going to the beach, walking the dogs, transfers,carrying things, grocery shopping, lifting, heavy cleaning, cooking (08/07/22); Goal status: In-progress     PLAN:   PT FREQUENCY: 1-2x/week  PT DURATION: 12 weeks   PLANNED INTERVENTIONS: Therapeutic exercises, Therapeutic activity, Neuromuscular re-education, Balance training, Gait training, Patient/Family education, Self Care, Joint mobilization, Stair training, DME instructions, Dry Needling, Electrical stimulation, Spinal mobilization, Cryotherapy, Moist heat, Manual therapy, and Re-evaluation   PLAN FOR NEXT SESSION: gently progressive strengthening focusing on R hip and functional strength, balance, manual therapy as needed.      Nancy Nordmann, PT, DPT 08/16/2022, 3:17 PM   [facility] [address] [place], [place] [address] P: [phone] I F: [phone]

## 2022-08-22 ENCOUNTER — Ambulatory Visit: Payer: Managed Care, Other (non HMO) | Admitting: Physical Therapy

## 2022-08-22 ENCOUNTER — Encounter: Payer: Self-pay | Admitting: Physical Therapy

## 2022-08-22 DIAGNOSIS — M25551 Pain in right hip: Secondary | ICD-10-CM | POA: Diagnosis not present

## 2022-08-22 DIAGNOSIS — R262 Difficulty in walking, not elsewhere classified: Secondary | ICD-10-CM

## 2022-08-22 DIAGNOSIS — M6281 Muscle weakness (generalized): Secondary | ICD-10-CM

## 2022-08-22 NOTE — Therapy (Signed)
OUTPATIENT PHYSICAL THERAPY TREATMENT NOTE   Patient Name: Carolyn Esparza MRN: [id number] DOB:1965/08/21, 57 y.o., female Today's Date: 08/22/2022  PCP: Carollee Leitz, MD REFERRING PROVIDER: Charlcie Cradle, MD  END OF SESSION:   PT End of Session - 08/22/22 1013     Visit Number 4    Number of Visits 24    Date for PT Re-Evaluation 10/30/22    Authorization Type [employer] reporting period from 04/24/2022    Authorization Time Period VL: 23 per calendar year    Authorization - Visit Number 4    Authorization - Number of Visits 60    Progress Note Due on Visit 10    PT Start Time [phone]    PT Stop Time 1027    PT Time Calculation (min) 38 min    Activity Tolerance Patient tolerated treatment well;Patient limited by fatigue;Patient limited by pain    Behavior During Therapy [facility] for tasks assessed/performed               Past Medical History:  Diagnosis Date   Anxiety    ASCUS with positive high risk HPV cervical    CAD (coronary artery disease)    Chicken pox    Chronic pain    neck, back, b/l hips    Chronic pain of right hip 1/74/9449   Complication of anesthesia    DDD (degenerative disc disease), lumbar 01/06/2021   Depression    Facet arthritis of lumbar region 01/06/2021   GERD (gastroesophageal reflux disease)    History of Crohn's disease    Hyperlipidemia    Left ovarian cyst    s/p removal of 1 ovary ? which one removed per pt    Libido, decreased    Skin cancer    reports skin cancer removed from skin in 2016 or 2017   Thyroid nodule    repeat US resolved and Ct neck 05/2019 normal thyroid    Past Surgical History:  Procedure Laterality Date   ABDOMINAL HYSTERECTOMY     APPENDECTOMY  2004   BLADDER SURGERY     BREAST BIOPSY     x 2   COLONOSCOPY     COLONOSCOPY WITH PROPOFOL N/A 10/25/2020   Procedure: COLONOSCOPY WITH PROPOFOL;  Surgeon: Mauri Pole, MD;  Location: [facility];  Service: Endoscopy;  Laterality: N/A;   HEMOSTASIS CLIP  PLACEMENT  10/25/2020   Procedure: HEMOSTASIS CLIP PLACEMENT;  Surgeon: Mauri Pole, MD;  Location: [facility];  Service: Endoscopy;;   OOPHORECTOMY Right    Unilateral Right (per MRI 04/17/2018)   POLYPECTOMY  10/25/2020   Procedure: POLYPECTOMY;  Surgeon: Mauri Pole, MD;  Location: [facility];  Service: Endoscopy;;   SUBMUCOSAL LIFTING INJECTION  10/25/2020   Procedure: SUBMUCOSAL LIFTING INJECTION;  Surgeon: Mauri Pole, MD;  Location: [facility];  Service: Endoscopy;;   WISDOM TOOTH EXTRACTION     Patient Active Problem List   Diagnosis Date Noted   Cervicalgia 07/01/2021   Low back pain 07/01/2021   Herpes infection 07/01/2021   Chronic pain of right hip 04/28/2021   DDD (degenerative disc disease), lumbar 01/06/2021   Facet arthritis of lumbar region 01/06/2021   Trochanteric bursitis of both hips 11/30/2020   Special screening for malignant neoplasms, colon    Polyp of cecum    Pain in both feet 04/12/2020   Burning sensation of feet 04/12/2020   Pain of both hip joints 04/12/2020   Onychomycosis 04/12/2020   Fibrocystic breast disease (FCBD) 04/12/2020  Numbness in both legs 09/09/2019   Spinal stenosis in cervical region 05/09/2019   Facet arthropathy, cervical 05/09/2019   Chronic pain 11/27/2018   Obesity (BMI 30.0-34.9) 11/27/2018   Sprain and strain of hip and thigh 05/10/2018   Abnormal MRI, cervical spine 03/28/2018   Menopause 11/26/2017   Right upper quadrant pain 11/26/2017   Abnormal Pap smear of cervix 07/27/2017   Vasomotor flushing 07/19/2017   Depression 07/18/2017   Nausea 06/25/2017   Chronic pain of both hips 06/25/2017   OSA (obstructive sleep apnea) 04/18/2017   Annual physical exam 04/10/2017   Fatigue 12/16/2016   Chronic back pain 05/30/2016   Abdominal pain 03/12/2016   Anxiety and depression 12/28/2015   History of obstructive sleep apnea 12/15/2015   GERD (gastroesophageal reflux disease) 12/15/2015    Hyperlipidemia 12/15/2015   Pain medication agreement signed 03/25/2015   CAD in native artery 02/05/2015   Right supraspinatus tenosynovitis 08/17/2014   Crohn's disease involving terminal ileum ([place]) 03/30/2014   FH: colon polyps 03/30/2014   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 female who presents to outpatient physical therapy with a referral for medical diagnosis tear of right gluteus medius tendon. This patient's chief complaints consist of right hip weakness and pain s/p OPEN REPAIR R GLUTEUS MEDIUS gluteal repair 12/05/2021, leading to the following functional deficits: difficulty with usual activities including those that involve ascending/descending stairs, walking more than 5K steps, exercising and being active, going to the beach, walking the dogs, transfers,carrying things, grocery shopping, lifting, heavy cleaning, cooking. 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  Patient denies hx of stroke, seizures, lung problems, diabetes, unexplained weight loss, unexplained changes in bowel or bladder problems, unexplained stumbling or dropping things, osteoporosis, and spinal surgery.    PRECAUTIONS: None official, but "be careful" because he had another patient who went to the weight training at the gym  and re-tore it.   SUBJECTIVE:  SUBJECTIVE STATEMENT:  Patient reports she is feeling well today. She states she was sore all over for 2 days after her last PT session but not "bad sore" and not specific to her right hip. She did not do her HEP those two days but has returned to them since. She saw her pain doctor yesterday and got a medication refill.    PAIN:  Are you having pain? NPRS 0/10 R hip   OBJECTIVE:   TODAY'S TREATMENT:  Therapeutic exercise: to centralize symptoms and improve ROM, strength, muscular endurance, and activity tolerance required for successful completion of functional activities.  - lateral stepping over small aerobic step, 3x10 at AROM/2/2# AW.  - wall side plank. 3x30 seconds each side, attempted with hip abduction last two set with R leg on top (unable to tolerate with R leg supporting).  - squat to tap on airex pad on 17 inch chair to overhead press using 4#DB in each hand, with RTB loop pulling laterally, 3x10 each side.  - lateral push on treadmill turned off, 2x30 seconds standing on each side R side. (Had to stop 2nd time with right LE pushing due to cramping at right glute med).  - modified hollow hold with B LE in table top and shoulders flexed 90 degrees with full IR, 5x20 seconds with black theratube providing feedback about lumbar spine pressing into mat.    Pt required multimodal cuing for proper technique and to facilitate improved neuromuscular control, strength, range of motion, and functional ability resulting in improved performance and form.     PATIENT EDUCATION:  Education details: Exercise purpose/form. Self management techniques. Education on HEP including handout.  Reviewed cancelation/no-show policy with patient and confirmed patient has correct phone number for  clinic; patient verbalized understanding (08/07/22). Person educated: Patient Education method: Explanation, Demonstration, Tactile cues, and Verbal cues Education comprehension: verbalized understanding, returned demonstration, verbal cues required, and needs further education   HOME EXERCISE PROGRAM: Access Code: [email or web address] URL: [email or web address] Date: 08/16/2022 Prepared by: Rosita Kea  Exercises - Sidelying Hip Abduction  - 1 x daily - 2-3 sets - 8 reps - Standing Hip Abduction with Counter Support  - 1 x daily - 2-3 sets - 10 reps - 2 seconds hold - Step Up  - 1 x daily - 2-3 sets - 10 reps - The Hundred 3 Intermediate - Table Top  - 1 x daily - 5 reps - 20 seconds hold   ASSESSMENT:   CLINICAL IMPRESSION: Patient arrives with good overall tolerance to last PT session. Continued with interventions for LE/functional strengthening and designed to specifically load right glute med/min as appropriate. Patient continues to be limited by quick fatigue and cramping at the right glute med, but continues to make gradual progress. Patient would benefit from continued management of limiting condition by skilled physical therapist to address remaining impairments and functional limitations to work towards stated goals and return to PLOF or maximal functional independence.   From Initial Eval 08/07/2021:  Patient is a 57 y.o. female referred to outpatient physical therapy with a medical diagnosis of tear of right gluteus medius tendon who presents with signs and symptoms consistent with chronic R hip pain and weakness s/p R OPEN REPAIR R GLUTEUS MEDIUS gluteal repair on 12/05/2021. Patient presents with significant pain, muscle performance (strength/power/endurance), motor control, balance, and activity tolerance impairments that are limiting ability to complete her usual activities including those that involve ascending/descending stairs, walking more than 5K steps, exercising and being  active, going  to the beach, walking the dogs, transfers,carrying things, grocery shopping, lifting, heavy cleaning, cooking without difficulty. Patient has previously had difficulty tolerating strengthening progressions except when progressed very slowly. Recommend visit frequency of 1x per week as tolerated by patient. Patient will benefit from skilled physical therapy intervention to address current body structure impairments and activity limitations to improve function and work towards goals set in current POC in order to return to prior level of function or maximal functional improvement.      OBJECTIVE IMPAIRMENTS: Abnormal gait, decreased activity tolerance, decreased balance, decreased coordination, decreased endurance, decreased knowledge of condition, decreased mobility, difficulty walking, decreased strength, impaired perceived functional ability, increased muscle spasms, improper body mechanics, and pain.    ACTIVITY LIMITATIONS: carrying, lifting, bending, standing, squatting, sleeping, stairs, and transfers   PARTICIPATION LIMITATIONS: meal prep, cleaning, laundry, interpersonal relationship, shopping, community activity, and   usual activities including those that involve ascending/descending stairs, walking more than 5K steps, exercising and being active, going to the beach, walking the dogs, transfers,carrying things, grocery shopping, lifting, heavy cleaning, cooking   PERSONAL FACTORS: Fitness, Past/current experiences, Time since onset of injury/illness/exacerbation, and 3+ comorbidities:   chronic back pain, chronic neck pain, hypermobile in several joints, GERD, fatigue, depression/anxiety, CAD (smallest artery in heart - no stent recommended - sees cardiologist no restrictions), skin cancer (cleared up a few years ago), chronic pain patient, on keto diet for about 1.5 years are also affecting patient's functional outcome.    REHAB POTENTIAL: Good   CLINICAL DECISION MAKING:  Evolving/moderate complexity   EVALUATION COMPLEXITY: Moderate     GOALS: Goals reviewed with patient? No   SHORT TERM GOALS: Target date: 08/22/2022   Patient will be independent with initial home exercise program for self-management of symptoms. Baseline: Initial HEP provided at IE (08/07/22); Goal status: MET     LONG TERM GOALS: Target date: 10/30/2022   Patient will be independent with a long-term home exercise program for self-management of symptoms.  Baseline: Initial HEP provided at IE (08/07/22); Goal status: In-progress   2.  Patient will demonstrate improved FOTO to equal or greater than 57 by visit #15 to demonstrate improvement in overall condition and self-reported functional ability.  Baseline: 37 (08/07/22); Goal status: In-progress   3.  Patient will complete 1x10 consecutive single leg runner's step up on R LE to 8 inch or higher step with no UE support or increase in pain to demonstrate improved strength and balance to climb stairs.  Baseline: pain with single step up to 6 inch step (08/07/22); Goal status: In-progress   4.  Patient will balance equal or greater than 60 seconds on R single leg stance without trendelenburg to demonstrate improved functional strength of right lateral hip for improved walking.  Baseline: 25 seconds with trendelenburg (08/07/22); Goal status: In-progress   5.  Patient will complete community, work and/or recreational activities without limitation due to current condition.  Baseline: difficulty with usual activities including those that involve ascending/descending stairs, walking more than 5K steps, exercising and being active, going to the beach, walking the dogs, transfers,carrying things, grocery shopping, lifting, heavy cleaning, cooking (08/07/22); Goal status: In-progress     PLAN:   PT FREQUENCY: 1-2x/week   PT DURATION: 12 weeks   PLANNED INTERVENTIONS: Therapeutic exercises, Therapeutic activity, Neuromuscular  re-education, Balance training, Gait training, Patient/Family education, Self Care, Joint mobilization, Stair training, DME instructions, Dry Needling, Electrical stimulation, Spinal mobilization, Cryotherapy, Moist heat, Manual therapy, and Re-evaluation   PLAN FOR NEXT SESSION:  gently progressive strengthening focusing on R hip and functional strength, balance, manual therapy as needed.      Nancy Nordmann, PT, DPT 08/22/2022, 8:32 PM   [facility] [address] [place], [place] [address] P: [phone] I F: [phone]

## 2022-08-30 ENCOUNTER — Ambulatory Visit: Payer: Managed Care, Other (non HMO) | Admitting: Physical Therapy

## 2022-08-31 ENCOUNTER — Encounter: Payer: Self-pay | Admitting: Physical Therapy

## 2022-08-31 ENCOUNTER — Ambulatory Visit: Payer: Managed Care, Other (non HMO) | Attending: Orthopedic Surgery | Admitting: Physical Therapy

## 2022-08-31 DIAGNOSIS — M6281 Muscle weakness (generalized): Secondary | ICD-10-CM | POA: Insufficient documentation

## 2022-08-31 DIAGNOSIS — M25551 Pain in right hip: Secondary | ICD-10-CM | POA: Insufficient documentation

## 2022-08-31 DIAGNOSIS — R262 Difficulty in walking, not elsewhere classified: Secondary | ICD-10-CM | POA: Diagnosis present

## 2022-08-31 NOTE — Therapy (Signed)
OUTPATIENT PHYSICAL THERAPY TREATMENT NOTE   Patient Name: Carolyn Esparza MRN: [id number] DOB:[birth year], 57 y.o., female Today's Date: 08/31/2022  PCP: Carollee Leitz, MD REFERRING PROVIDER: Charlcie Cradle, MD  END OF SESSION:   PT End of Session - 08/31/22 1651     Visit Number 5    Number of Visits 24    Date for PT Re-Evaluation 10/30/22    Authorization Type [employer] reporting period from 04/24/2022    Authorization Time Period VL: 88 per calendar year    Authorization - Visit Number 5    Authorization - Number of Visits 60    Progress Note Due on Visit 10    PT Start Time 0737    PT Stop Time 1728    PT Time Calculation (min) 38 min    Activity Tolerance Patient tolerated treatment well;Patient limited by fatigue;Patient limited by pain    Behavior During Therapy [facility] for tasks assessed/performed                Past Medical History:  Diagnosis Date   Anxiety    ASCUS with positive high risk HPV cervical    CAD (coronary artery disease)    Chicken pox    Chronic pain    neck, back, b/l hips    Chronic pain of right hip 08/05/2692   Complication of anesthesia    DDD (degenerative disc disease), lumbar 01/06/2021   Depression    Facet arthritis of lumbar region 01/06/2021   GERD (gastroesophageal reflux disease)    History of Crohn's disease    Hyperlipidemia    Left ovarian cyst    s/p removal of 1 ovary ? which one removed per pt    Libido, decreased    Skin cancer    reports skin cancer removed from skin in 2016 or 2017   Thyroid nodule    repeat US resolved and Ct neck 05/2019 normal thyroid    Past Surgical History:  Procedure Laterality Date   ABDOMINAL HYSTERECTOMY     APPENDECTOMY  2004   BLADDER SURGERY     BREAST BIOPSY     x 2   COLONOSCOPY     COLONOSCOPY WITH PROPOFOL N/A 10/25/2020   Procedure: COLONOSCOPY WITH PROPOFOL;  Surgeon: Mauri Pole, MD;  Location: [facility];  Service: Endoscopy;  Laterality: N/A;   HEMOSTASIS CLIP  PLACEMENT  10/25/2020   Procedure: HEMOSTASIS CLIP PLACEMENT;  Surgeon: Mauri Pole, MD;  Location: [facility];  Service: Endoscopy;;   OOPHORECTOMY Right    Unilateral Right (per MRI 04/17/2018)   POLYPECTOMY  10/25/2020   Procedure: POLYPECTOMY;  Surgeon: Mauri Pole, MD;  Location: [facility];  Service: Endoscopy;;   SUBMUCOSAL LIFTING INJECTION  10/25/2020   Procedure: SUBMUCOSAL LIFTING INJECTION;  Surgeon: Mauri Pole, MD;  Location: [facility];  Service: Endoscopy;;   WISDOM TOOTH EXTRACTION     Patient Active Problem List   Diagnosis Date Noted   Cervicalgia 07/01/2021   Low back pain 07/01/2021   Herpes infection 07/01/2021   Chronic pain of right hip 04/28/2021   DDD (degenerative disc disease), lumbar 01/06/2021   Facet arthritis of lumbar region 01/06/2021   Trochanteric bursitis of both hips 11/30/2020   Special screening for malignant neoplasms, colon    Polyp of cecum    Pain in both feet 04/12/2020   Burning sensation of feet 04/12/2020   Pain of both hip joints 04/12/2020   Onychomycosis 04/12/2020   Fibrocystic breast disease (FCBD)  04/12/2020   Numbness in both legs 09/09/2019   Spinal stenosis in cervical region 05/09/2019   Facet arthropathy, cervical 05/09/2019   Chronic pain 11/27/2018   Obesity (BMI 30.0-34.9) 11/27/2018   Sprain and strain of hip and thigh 05/10/2018   Abnormal MRI, cervical spine 03/28/2018   Menopause 11/26/2017   Right upper quadrant pain 11/26/2017   Abnormal Pap smear of cervix 07/27/2017   Vasomotor flushing 07/19/2017   Depression 07/18/2017   Nausea 06/25/2017   Chronic pain of both hips 06/25/2017   OSA (obstructive sleep apnea) 04/18/2017   Annual physical exam 04/10/2017   Fatigue 12/16/2016   Chronic back pain 05/30/2016   Abdominal pain 03/12/2016   Anxiety and depression 12/28/2015   History of obstructive sleep apnea 12/15/2015   GERD (gastroesophageal reflux disease) 12/15/2015    Hyperlipidemia 12/15/2015   Pain medication agreement signed 03/25/2015   CAD in native artery 02/05/2015   Right supraspinatus tenosynovitis 08/17/2014   Crohn's disease involving terminal ileum ([place]) 03/30/2014   FH: colon polyps 03/30/2014   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 female who presents to outpatient physical therapy with a referral for medical diagnosis tear of right gluteus medius tendon. This patient's chief complaints consist of right hip weakness and pain s/p OPEN REPAIR R GLUTEUS MEDIUS gluteal repair 12/05/2021, leading to the following functional deficits: difficulty with usual activities including those that involve ascending/descending stairs, walking more than 5K steps, exercising and being active, going to the beach, walking the dogs, transfers,carrying things, grocery shopping, lifting, heavy cleaning, cooking. 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  Patient denies hx of stroke, seizures, lung problems, diabetes, unexplained weight loss, unexplained changes in bowel or bladder problems, unexplained stumbling or dropping things, osteoporosis, and spinal surgery.    PRECAUTIONS: None official, but "be careful" because he had another patient who went to the weight training at the gym  and re-tore it.   SUBJECTIVE:  SUBJECTIVE STATEMENT:  Patient reports she is feeling tired today and has been tired for 2 days.  She states she has no R hip pain upon arrival. She had three days where she did not do her HEP, one was a rest break. She was not too sore after last PT session.    PAIN:  Are you having pain? NPRS 0/10 R hip   OBJECTIVE  OBJECTIVE  SELF-REPORTED FUNCTION FOTO score: 51/100 (hip questionnaire)   TODAY'S TREATMENT:  Therapeutic exercise: to centralize symptoms and improve ROM, strength, muscular endurance, and activity tolerance required for successful completion of functional activities.  - lateral stepping over small aerobic step, 3x10 at 3# AW.  - wall side plank. 3x30 seconds each side, attempted with hip abduction last two set with R leg on top (unable to tolerate with R leg supporting).  - squat to tap on airex pad on 17 inch chair to overhead press using 4#DB in each hand, with GTB loop pulling laterally, 3x10 each side.  - lateral push on treadmill turned off, 2x30 seconds standing on each side.  - modified hollow hold with B LE in table top and shoulders flexed 90 degrees with full IR, 5x30 seconds with black theratube providing feedback about lumbar spine pressing into mat.    Pt required multimodal cuing for proper technique and to facilitate improved neuromuscular control, strength, range of motion, and functional ability resulting in improved performance and form.     PATIENT EDUCATION:  Education details: Exercise purpose/form. Self management techniques. Education on HEP including handout.  Reviewed cancelation/no-show policy with patient and confirmed patient has correct phone number for clinic; patient verbalized understanding (08/07/22). Person educated:  Patient Education method: Explanation, Demonstration, Tactile cues, and Verbal cues Education comprehension: verbalized understanding, returned demonstration, verbal cues required, and needs further education   HOME EXERCISE PROGRAM: Access Code: [email or web address] URL: [email or web address] Date: 08/16/2022 Prepared by: Rosita Kea  Exercises - Sidelying Hip Abduction  - 1 x daily - 2-3 sets - 8 reps - Standing Hip Abduction with Counter Support  - 1 x daily - 2-3 sets - 10 reps - 2 seconds hold - Step Up  - 1 x daily - 2-3 sets - 10 reps - The Hundred 3 Intermediate - Table Top  - 1 x daily - 5 reps - 20 seconds hold   ASSESSMENT:   CLINICAL IMPRESSION: Patient arrives very tired with report of improving tolerance to last PT session. Continued with similar exercises with slight progressions as tolerated. Still unable to complete wall side plank with hip abduction reps when R LE is supporting but did hot have to stop any other exercises due to cramping. She also reports improving endurance throughout the day with less times when she feels limited and needing to stop due to right hip pain. She has not yet returned to [facility] and continues to be limited due to her condition.  Patient would benefit from continued management of limiting condition by skilled physical therapist to address remaining impairments and functional limitations to work towards stated goals and return to PLOF or maximal functional independence.   From Initial Eval 08/07/2021:  Patient is a 57 y.o. female referred to outpatient physical therapy with a medical diagnosis of tear of right gluteus medius tendon who presents with signs and symptoms consistent with chronic R hip pain and weakness s/p R OPEN REPAIR R GLUTEUS MEDIUS gluteal repair on 12/05/2021. Patient presents with significant pain, muscle performance (strength/power/endurance), motor control, balance, and activity tolerance  impairments that are limiting ability to  complete her usual activities including those that involve ascending/descending stairs, walking more than 5K steps, exercising and being active, going to the beach, walking the dogs, transfers,carrying things, grocery shopping, lifting, heavy cleaning, cooking without difficulty. Patient has previously had difficulty tolerating strengthening progressions except when progressed very slowly. Recommend visit frequency of 1x per week as tolerated by patient. Patient will benefit from skilled physical therapy intervention to address current body structure impairments and activity limitations to improve function and work towards goals set in current POC in order to return to prior level of function or maximal functional improvement.      OBJECTIVE IMPAIRMENTS: Abnormal gait, decreased activity tolerance, decreased balance, decreased coordination, decreased endurance, decreased knowledge of condition, decreased mobility, difficulty walking, decreased strength, impaired perceived functional ability, increased muscle spasms, improper body mechanics, and pain.    ACTIVITY LIMITATIONS: carrying, lifting, bending, standing, squatting, sleeping, stairs, and transfers   PARTICIPATION LIMITATIONS: meal prep, cleaning, laundry, interpersonal relationship, shopping, community activity, and   usual activities including those that involve ascending/descending stairs, walking more than 5K steps, exercising and being active, going to the beach, walking the dogs, transfers,carrying things, grocery shopping, lifting, heavy cleaning, cooking   PERSONAL FACTORS: Fitness, Past/current experiences, Time since onset of injury/illness/exacerbation, and 3+ comorbidities:   chronic back pain, chronic neck pain, hypermobile in several joints, GERD, fatigue, depression/anxiety, CAD (smallest artery in heart - no stent recommended - sees cardiologist no restrictions), skin cancer (cleared up a few years ago), chronic pain patient, on keto  diet for about 1.5 years are also affecting patient's functional outcome.    REHAB POTENTIAL: Good   CLINICAL DECISION MAKING: Evolving/moderate complexity   EVALUATION COMPLEXITY: Moderate     GOALS: Goals reviewed with patient? No   SHORT TERM GOALS: Target date: 08/22/2022   Patient will be independent with initial home exercise program for self-management of symptoms. Baseline: Initial HEP provided at IE (08/07/22); Goal status: MET     LONG TERM GOALS: Target date: 10/30/2022   Patient will be independent with a long-term home exercise program for self-management of symptoms.  Baseline: Initial HEP provided at IE (08/07/22); Goal status: In-progress   2.  Patient will demonstrate improved FOTO to equal or greater than 57 by visit #15 to demonstrate improvement in overall condition and self-reported functional ability.  Baseline: 37 (08/07/22); 51 at visit #5 (08/31/2022);  Goal status: In-progress   3.  Patient will complete 1x10 consecutive single leg runner's step up on R LE to 8 inch or higher step with no UE support or increase in pain to demonstrate improved strength and balance to climb stairs.  Baseline: pain with single step up to 6 inch step (08/07/22); Goal status: In-progress   4.  Patient will balance equal or greater than 60 seconds on R single leg stance without trendelenburg to demonstrate improved functional strength of right lateral hip for improved walking.  Baseline: 25 seconds with trendelenburg (08/07/22); Goal status: In-progress   5.  Patient will complete community, work and/or recreational activities without limitation due to current condition.  Baseline: difficulty with usual activities including those that involve ascending/descending stairs, walking more than 5K steps, exercising and being active, going to the beach, walking the dogs, transfers,carrying things, grocery shopping, lifting, heavy cleaning, cooking (08/07/22); Goal status: In-progress      PLAN:   PT FREQUENCY: 1-2x/week   PT DURATION: 12 weeks   PLANNED INTERVENTIONS: Therapeutic exercises, Therapeutic activity, Neuromuscular re-education,  Balance training, Gait training, Patient/Family education, Self Care, Joint mobilization, Stair training, DME instructions, Dry Needling, Electrical stimulation, Spinal mobilization, Cryotherapy, Moist heat, Manual therapy, and Re-evaluation   PLAN FOR NEXT SESSION: gently progressive strengthening focusing on R hip and functional strength, balance, manual therapy as needed.      Nancy Nordmann, PT, DPT 08/31/2022, 7:27 PM   [facility] [address] [place], [place] [address] P: [phone] I F: [phone]

## 2022-09-04 ENCOUNTER — Other Ambulatory Visit: Payer: Self-pay

## 2022-09-04 ENCOUNTER — Encounter: Payer: Managed Care, Other (non HMO) | Admitting: Physical Therapy

## 2022-09-04 DIAGNOSIS — M542 Cervicalgia: Secondary | ICD-10-CM

## 2022-09-04 DIAGNOSIS — M25551 Pain in right hip: Secondary | ICD-10-CM

## 2022-09-04 MED ORDER — CELECOXIB 200 MG PO CAPS: ORAL_CAPSULE | ORAL | 3 refills | Status: DC

## 2022-09-06 ENCOUNTER — Encounter: Payer: Self-pay | Admitting: Physical Therapy

## 2022-09-06 ENCOUNTER — Ambulatory Visit: Payer: Managed Care, Other (non HMO) | Admitting: Physical Therapy

## 2022-09-06 DIAGNOSIS — R262 Difficulty in walking, not elsewhere classified: Secondary | ICD-10-CM

## 2022-09-06 DIAGNOSIS — M25551 Pain in right hip: Secondary | ICD-10-CM

## 2022-09-06 DIAGNOSIS — M6281 Muscle weakness (generalized): Secondary | ICD-10-CM

## 2022-09-06 NOTE — Therapy (Signed)
OUTPATIENT PHYSICAL THERAPY TREATMENT NOTE   Patient Name: Chyan Carnero MRN: [id number] DOB:[birth year], 57 y.o., female Today's Date: 09/06/2022  PCP: Carollee Leitz, MD REFERRING PROVIDER: Charlcie Cradle, MD  END OF SESSION:   PT End of Session - 09/06/22 1351     Visit Number 6    Number of Visits 24    Date for PT Re-Evaluation 10/30/22    Authorization Type [employer] reporting period from 04/24/2022    Authorization Time Period VL: 20 per calendar year    Authorization - Visit Number 6    Authorization - Number of Visits 60    Progress Note Due on Visit 10    PT Start Time 1302    PT Stop Time 1342    PT Time Calculation (min) 40 min    Activity Tolerance Patient tolerated treatment well;Patient limited by fatigue;Patient limited by pain    Behavior During Therapy [facility] for tasks assessed/performed                 Past Medical History:  Diagnosis Date   Anxiety    ASCUS with positive high risk HPV cervical    CAD (coronary artery disease)    Chicken pox    Chronic pain    neck, back, b/l hips    Chronic pain of right hip 03/09/1750   Complication of anesthesia    DDD (degenerative disc disease), lumbar 01/06/2021   Depression    Facet arthritis of lumbar region 01/06/2021   GERD (gastroesophageal reflux disease)    History of Crohn's disease    Hyperlipidemia    Left ovarian cyst    s/p removal of 1 ovary ? which one removed per pt    Libido, decreased    Skin cancer    reports skin cancer removed from skin in 2016 or 2017   Thyroid nodule    repeat US resolved and Ct neck 05/2019 normal thyroid    Past Surgical History:  Procedure Laterality Date   ABDOMINAL HYSTERECTOMY     APPENDECTOMY  2004   BLADDER SURGERY     BREAST BIOPSY     x 2   COLONOSCOPY     COLONOSCOPY WITH PROPOFOL N/A 10/25/2020   Procedure: COLONOSCOPY WITH PROPOFOL;  Surgeon: Mauri Pole, MD;  Location: [facility];  Service: Endoscopy;  Laterality: N/A;   HEMOSTASIS CLIP  PLACEMENT  10/25/2020   Procedure: HEMOSTASIS CLIP PLACEMENT;  Surgeon: Mauri Pole, MD;  Location: [facility];  Service: Endoscopy;;   OOPHORECTOMY Right    Unilateral Right (per MRI 04/17/2018)   POLYPECTOMY  10/25/2020   Procedure: POLYPECTOMY;  Surgeon: Mauri Pole, MD;  Location: [facility];  Service: Endoscopy;;   SUBMUCOSAL LIFTING INJECTION  10/25/2020   Procedure: SUBMUCOSAL LIFTING INJECTION;  Surgeon: Mauri Pole, MD;  Location: [facility];  Service: Endoscopy;;   WISDOM TOOTH EXTRACTION     Patient Active Problem List   Diagnosis Date Noted   Cervicalgia 07/01/2021   Low back pain 07/01/2021   Herpes infection 07/01/2021   Chronic pain of right hip 04/28/2021   DDD (degenerative disc disease), lumbar 01/06/2021   Facet arthritis of lumbar region 01/06/2021   Trochanteric bursitis of both hips 11/30/2020   Special screening for malignant neoplasms, colon    Polyp of cecum    Pain in both feet 04/12/2020   Burning sensation of feet 04/12/2020   Pain of both hip joints 04/12/2020   Onychomycosis 04/12/2020   Fibrocystic breast disease (  [facility]) 04/12/2020   Numbness in both legs 09/09/2019   Spinal stenosis in cervical region 05/09/2019   Facet arthropathy, cervical 05/09/2019   Chronic pain 11/27/2018   Obesity (BMI 30.0-34.9) 11/27/2018   Sprain and strain of hip and thigh 05/10/2018   Abnormal MRI, cervical spine 03/28/2018   Menopause 11/26/2017   Right upper quadrant pain 11/26/2017   Abnormal Pap smear of cervix 07/27/2017   Vasomotor flushing 07/19/2017   Depression 07/18/2017   Nausea 06/25/2017   Chronic pain of both hips 06/25/2017   OSA (obstructive sleep apnea) 04/18/2017   Annual physical exam 04/10/2017   Fatigue 12/16/2016   Chronic back pain 05/30/2016   Abdominal pain 03/12/2016   Anxiety and depression 12/28/2015   History of obstructive sleep apnea 12/15/2015   GERD (gastroesophageal reflux disease) 12/15/2015    Hyperlipidemia 12/15/2015   Pain medication agreement signed 03/25/2015   CAD in native artery 02/05/2015   Right supraspinatus tenosynovitis 08/17/2014   Crohn's disease involving terminal ileum ([place]) 03/30/2014   FH: colon polyps 03/30/2014   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 female who presents to outpatient physical therapy with a referral for medical diagnosis tear of right gluteus medius tendon. This patient's chief complaints consist of right hip weakness and pain s/p OPEN REPAIR R GLUTEUS MEDIUS gluteal repair 12/05/2021, leading to the following functional deficits: difficulty with usual activities including those that involve ascending/descending stairs, walking more than 5K steps, exercising and being active, going to the beach, walking the dogs, transfers,carrying things, grocery shopping, lifting, heavy cleaning, cooking. 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  Patient denies hx of stroke, seizures, lung problems, diabetes, unexplained weight loss, unexplained changes in bowel or bladder problems, unexplained stumbling or dropping things, osteoporosis, and spinal surgery.    PRECAUTIONS: None official, but "be careful" because he had another patient who went to the weight training at the gym  and re-tore it.   SUBJECTIVE:  SUBJECTIVE STATEMENT:  Patient reports her hip is doing pretty good. It was a little sore in her right hip after last PT session. She would like to decrease the weights in the overhead press because her left shoulder has been hurting. She has no pain currently.    PAIN:  Are you having pain? NPRS 0/10 R hip   OBJECTIVE   TODAY'S TREATMENT:  Therapeutic exercise: to centralize symptoms and improve ROM, strength, muscular endurance, and activity tolerance required for successful completion of functional activities.  - lateral stepping over small 6-inch aerobic step, 3x10 at 5# AW. UE support available but not needed.  - wall side plank. 3x30 seconds each side, attempted with hip abduction last two set with R leg on top (unable to tolerate with R leg supporting).  - squat to tap on 17 inch chair, with GTB loop pulling laterally, 3x10 each side.  - lateral wide step side to side with brief single leg stance hold, ~ 3 feet apart.  - modified hollow hold with B LE in table top and shoulders flexed 90 degrees with full IR, 5x30 seconds with black theratube providing feedback about lumbar spine pressing into mat.  -    Pt required multimodal cuing for proper technique and to facilitate improved neuromuscular control, strength, range of motion, and functional ability resulting in improved performance and form.     PATIENT EDUCATION:  Education details: Exercise purpose/form. Self management techniques. Education on HEP including handout.  Reviewed cancelation/no-show policy with patient and confirmed patient has correct phone number for clinic; patient verbalized understanding (08/07/22). Person educated: Patient Education method: Explanation, Demonstration, Tactile cues, and Verbal  cues Education comprehension: verbalized understanding, returned demonstration, verbal cues required, and needs further education   HOME EXERCISE PROGRAM: Access Code: [email or web address] URL: [email or web address] Date: 08/16/2022 Prepared by: Rosita Kea  Exercises - Sidelying Hip Abduction  - 1 x daily - 2-3 sets - 8 reps - Standing Hip Abduction with Counter Support  - 1 x daily - 2-3 sets - 10 reps - 2 seconds hold - Step Up  - 1 x daily - 2-3 sets - 10 reps - The Hundred 3 Intermediate - Table Top  - 1 x daily - 5 reps - 20 seconds hold   ASSESSMENT:   CLINICAL IMPRESSION: Patient arrives with good tolerance to last PT session and continued with strengthening and proprioception exercise for the R hip and generalized LE and core strength and functioning. Patient reports fatigue in the right hip by end of session. She had some back pain with progression to lateral step that resolved with discontinuation of the exercise. Exercises were modified to decrease UE load per patient request. Patient would benefit from continued management of limiting condition by skilled physical therapist to address remaining impairments and functional limitations to work towards stated goals and return to PLOF or maximal functional independence.   From Initial Eval 08/07/2021:  Patient is a 57 y.o. female referred to outpatient physical therapy with a medical diagnosis of tear of right gluteus medius tendon who presents with signs and symptoms consistent with chronic R hip pain and weakness s/p R OPEN REPAIR R GLUTEUS MEDIUS gluteal repair on 12/05/2021. Patient presents with significant pain, muscle performance (strength/power/endurance), motor control, balance, and activity tolerance impairments that are limiting ability to complete her usual activities including those that involve ascending/descending stairs, walking more than 5K steps, exercising and being active, going to the beach, walking the dogs,  transfers,carrying things, grocery shopping, lifting, heavy  cleaning, cooking without difficulty. Patient has previously had difficulty tolerating strengthening progressions except when progressed very slowly. Recommend visit frequency of 1x per week as tolerated by patient. Patient will benefit from skilled physical therapy intervention to address current body structure impairments and activity limitations to improve function and work towards goals set in current POC in order to return to prior level of function or maximal functional improvement.      OBJECTIVE IMPAIRMENTS: Abnormal gait, decreased activity tolerance, decreased balance, decreased coordination, decreased endurance, decreased knowledge of condition, decreased mobility, difficulty walking, decreased strength, impaired perceived functional ability, increased muscle spasms, improper body mechanics, and pain.    ACTIVITY LIMITATIONS: carrying, lifting, bending, standing, squatting, sleeping, stairs, and transfers   PARTICIPATION LIMITATIONS: meal prep, cleaning, laundry, interpersonal relationship, shopping, community activity, and   usual activities including those that involve ascending/descending stairs, walking more than 5K steps, exercising and being active, going to the beach, walking the dogs, transfers,carrying things, grocery shopping, lifting, heavy cleaning, cooking   PERSONAL FACTORS: Fitness, Past/current experiences, Time since onset of injury/illness/exacerbation, and 3+ comorbidities:   chronic back pain, chronic neck pain, hypermobile in several joints, GERD, fatigue, depression/anxiety, CAD (smallest artery in heart - no stent recommended - sees cardiologist no restrictions), skin cancer (cleared up a few years ago), chronic pain patient, on keto diet for about 1.5 years are also affecting patient's functional outcome.    REHAB POTENTIAL: Good   CLINICAL DECISION MAKING: Evolving/moderate complexity   EVALUATION  COMPLEXITY: Moderate     GOALS: Goals reviewed with patient? No   SHORT TERM GOALS: Target date: 08/22/2022   Patient will be independent with initial home exercise program for self-management of symptoms. Baseline: Initial HEP provided at IE (08/07/22); Goal status: MET     LONG TERM GOALS: Target date: 10/30/2022   Patient will be independent with a long-term home exercise program for self-management of symptoms.  Baseline: Initial HEP provided at IE (08/07/22); Goal status: In-progress   2.  Patient will demonstrate improved FOTO to equal or greater than 57 by visit #15 to demonstrate improvement in overall condition and self-reported functional ability.  Baseline: 37 (08/07/22); 51 at visit #5 (08/31/2022);  Goal status: In-progress   3.  Patient will complete 1x10 consecutive single leg runner's step up on R LE to 8 inch or higher step with no UE support or increase in pain to demonstrate improved strength and balance to climb stairs.  Baseline: pain with single step up to 6 inch step (08/07/22); Goal status: In-progress   4.  Patient will balance equal or greater than 60 seconds on R single leg stance without trendelenburg to demonstrate improved functional strength of right lateral hip for improved walking.  Baseline: 25 seconds with trendelenburg (08/07/22); Goal status: In-progress   5.  Patient will complete community, work and/or recreational activities without limitation due to current condition.  Baseline: difficulty with usual activities including those that involve ascending/descending stairs, walking more than 5K steps, exercising and being active, going to the beach, walking the dogs, transfers,carrying things, grocery shopping, lifting, heavy cleaning, cooking (08/07/22); Goal status: In-progress     PLAN:   PT FREQUENCY: 1-2x/week   PT DURATION: 12 weeks   PLANNED INTERVENTIONS: Therapeutic exercises, Therapeutic activity, Neuromuscular re-education, Balance  training, Gait training, Patient/Family education, Self Care, Joint mobilization, Stair training, DME instructions, Dry Needling, Electrical stimulation, Spinal mobilization, Cryotherapy, Moist heat, Manual therapy, and Re-evaluation   PLAN FOR NEXT SESSION: gently progressive strengthening focusing on R  hip and functional strength, balance, manual therapy as needed.      Nancy Nordmann, PT, DPT 09/06/2022, 1:52 PM   [facility] [address] [place], [place] [address] P: [phone] I F: [phone]

## 2022-09-11 ENCOUNTER — Encounter: Payer: Managed Care, Other (non HMO) | Admitting: Physical Therapy

## 2022-09-13 ENCOUNTER — Encounter: Payer: Self-pay | Admitting: Physical Therapy

## 2022-09-13 ENCOUNTER — Ambulatory Visit: Payer: Managed Care, Other (non HMO) | Admitting: Physical Therapy

## 2022-09-13 DIAGNOSIS — R262 Difficulty in walking, not elsewhere classified: Secondary | ICD-10-CM

## 2022-09-13 DIAGNOSIS — M6281 Muscle weakness (generalized): Secondary | ICD-10-CM

## 2022-09-13 DIAGNOSIS — M25551 Pain in right hip: Secondary | ICD-10-CM

## 2022-09-13 NOTE — Therapy (Signed)
OUTPATIENT PHYSICAL THERAPY TREATMENT NOTE   Patient Name: Carolyn Esparza MRN: [id number] DOB:Dec 28, 1965, 57 y.o., female Today's Date: 09/13/2022  PCP: Carollee Leitz, MD REFERRING PROVIDER: Charlcie Cradle, MD  END OF SESSION:   PT End of Session - 09/13/22 1346     Visit Number 7    Number of Visits 24    Date for PT Re-Evaluation 10/30/22    Authorization Type [employer] reporting period from 04/24/2022    Authorization Time Period VL: 26 per calendar year    Authorization - Visit Number 7    Authorization - Number of Visits 60    Progress Note Due on Visit 10    PT Start Time [id number]    PT Stop Time 1426    PT Time Calculation (min) 38 min    Activity Tolerance Patient tolerated treatment well;Patient limited by fatigue;Patient limited by pain    Behavior During Therapy [facility] for tasks assessed/performed                  Past Medical History:  Diagnosis Date   Anxiety    ASCUS with positive high risk HPV cervical    CAD (coronary artery disease)    Chicken pox    Chronic pain    neck, back, b/l hips    Chronic pain of right hip [id number]   Complication of anesthesia    DDD (degenerative disc disease), lumbar 01/06/2021   Depression    Facet arthritis of lumbar region 01/06/2021   GERD (gastroesophageal reflux disease)    History of Crohn's disease    Hyperlipidemia    Left ovarian cyst    s/p removal of 1 ovary ? which one removed per pt    Libido, decreased    Skin cancer    reports skin cancer removed from skin in 2016 or 2017   Thyroid nodule    repeat US resolved and Ct neck 05/2019 normal thyroid    Past Surgical History:  Procedure Laterality Date   ABDOMINAL HYSTERECTOMY     APPENDECTOMY  2004   BLADDER SURGERY     BREAST BIOPSY     x 2   COLONOSCOPY     COLONOSCOPY WITH PROPOFOL N/A 10/25/2020   Procedure: COLONOSCOPY WITH PROPOFOL;  Surgeon: Mauri Pole, MD;  Location: [facility];  Service: Endoscopy;  Laterality: N/A;   HEMOSTASIS CLIP  PLACEMENT  10/25/2020   Procedure: HEMOSTASIS CLIP PLACEMENT;  Surgeon: Mauri Pole, MD;  Location: [facility];  Service: Endoscopy;;   OOPHORECTOMY Right    Unilateral Right (per MRI 04/17/2018)   POLYPECTOMY  10/25/2020   Procedure: POLYPECTOMY;  Surgeon: Mauri Pole, MD;  Location: [facility];  Service: Endoscopy;;   SUBMUCOSAL LIFTING INJECTION  10/25/2020   Procedure: SUBMUCOSAL LIFTING INJECTION;  Surgeon: Mauri Pole, MD;  Location: [facility];  Service: Endoscopy;;   WISDOM TOOTH EXTRACTION     Patient Active Problem List   Diagnosis Date Noted   Cervicalgia 07/01/2021   Low back pain 07/01/2021   Herpes infection 07/01/2021   Chronic pain of right hip 04/28/2021   DDD (degenerative disc disease), lumbar 01/06/2021   Facet arthritis of lumbar region 01/06/2021   Trochanteric bursitis of both hips 11/30/2020   Special screening for malignant neoplasms, colon    Polyp of cecum    Pain in both feet 04/12/2020   Burning sensation of feet 04/12/2020   Pain of both hip joints 04/12/2020   Onychomycosis 04/12/2020   Fibrocystic breast  disease (FCBD) 04/12/2020   Numbness in both legs 09/09/2019   Spinal stenosis in cervical region 05/09/2019   Facet arthropathy, cervical 05/09/2019   Chronic pain 11/27/2018   Obesity (BMI 30.0-34.9) 11/27/2018   Sprain and strain of hip and thigh 05/10/2018   Abnormal MRI, cervical spine 03/28/2018   Menopause 11/26/2017   Right upper quadrant pain 11/26/2017   Abnormal Pap smear of cervix 07/27/2017   Vasomotor flushing 07/19/2017   Depression 07/18/2017   Nausea 06/25/2017   Chronic pain of both hips 06/25/2017   OSA (obstructive sleep apnea) 04/18/2017   Annual physical exam 04/10/2017   Fatigue 12/16/2016   Chronic back pain 05/30/2016   Abdominal pain 03/12/2016   Anxiety and depression 12/28/2015   History of obstructive sleep apnea 12/15/2015   GERD (gastroesophageal reflux disease) 12/15/2015    Hyperlipidemia 12/15/2015   Pain medication agreement signed 03/25/2015   CAD in native artery 02/05/2015   Right supraspinatus tenosynovitis 08/17/2014   Crohn's disease involving terminal ileum ([place]) 03/30/2014   FH: colon polyps 03/30/2014   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 female who presents to outpatient physical therapy with a referral for medical diagnosis tear of right gluteus medius tendon. This patient's chief complaints consist of right hip weakness and pain s/p OPEN REPAIR R GLUTEUS MEDIUS gluteal repair 12/05/2021, leading to the following functional deficits: difficulty with usual activities including those that involve ascending/descending stairs, walking more than 5K steps, exercising and being active, going to the beach, walking the dogs, transfers,carrying things, grocery shopping, lifting, heavy cleaning, cooking. 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  Patient denies hx of stroke, seizures, lung problems, diabetes, unexplained weight loss, unexplained changes in bowel or bladder problems, unexplained stumbling or dropping things, osteoporosis, and spinal surgery.    PRECAUTIONS: None official, but "be careful" because he had another patient who went to the weight training at the gym  and re-tore it.   SUBJECTIVE:  SUBJECTIVE STATEMENT:  Patient reports she is okay today and her right hip is okay today. She feels like she is finally start to make progress on her right shoulder. She would like to avoid using weight on her right shoulder.    PAIN:  Are you having pain? NPRS 0/10 R hip   OBJECTIVE   TODAY'S TREATMENT:  Therapeutic exercise: to centralize symptoms and improve ROM, strength, muscular endurance, and activity tolerance required for successful completion of functional activities.  - lateral stepping over small 6-inch aerobic step, 3x10 at 5-4# AW. UE support available but not needed.  - lateral wide step side to side with brief single leg stance hold, ~ 2.5-3 feet apart. 3x30 seconds.  - lateral walk 3 feet to squat to tap on 17 inch chair, with GTB loop pulling laterally, 2x10 each side.  - wall side plank. 1x30 seconds each side, attempted with hip abduction last two set with R leg on top (unable to tolerate with R leg supporting).  - modified hollow hold with B LE in table top and shoulders flexed 90 degrees with full IR, 5x30 seconds with black theratube providing feedback about lumbar spine pressing into mat. Attempted extending one LE towards the hover position, but unable to hold throughout entire 30 seconds.    Pt required multimodal cuing for proper technique and to facilitate improved neuromuscular control, strength, range of motion, and functional ability resulting in improved performance and form.     PATIENT EDUCATION:  Education details: Exercise purpose/form. Self management techniques. Education on HEP including handout.  Reviewed cancelation/no-show policy with patient and confirmed patient has correct phone number for clinic; patient verbalized understanding  (08/07/22). Person educated: Patient Education method: Explanation, Demonstration, Tactile cues, and Verbal cues Education comprehension: verbalized understanding, returned demonstration, verbal cues required, and needs further education   HOME EXERCISE PROGRAM: Access Code: [email or web address] URL: [email or web address] Date: 08/16/2022 Prepared by: Rosita Kea  Exercises - Sidelying Hip Abduction  - 1 x daily - 2-3 sets - 8 reps - Standing Hip Abduction with Counter Support  - 1 x daily - 2-3 sets - 10 reps - 2 seconds hold - Step Up  - 1 x daily - 2-3 sets - 10 reps - The Hundred 3 Intermediate - Table Top  - 1 x daily - 5 reps - 20 seconds hold   ASSESSMENT:   CLINICAL IMPRESSION: Patient arrives with continued good tolerance to prior PT session. Continued with progressive exercises targeting right lateral hip and functional strength and mobility. Patient continues to be limited by decreased activity tolerance, muscular endurance, and strength especially in left hip. She was able to progress exercises slightly. Plan to continue with gradual strength progression next session. Patient would benefit from continued management of limiting condition by skilled physical therapist to address remaining impairments and functional limitations to work towards stated goals and return to PLOF or maximal functional independence.   From Initial Eval 08/07/2021:  Patient is a 57 y.o. female referred to outpatient physical therapy with a medical diagnosis of tear of right gluteus medius tendon who presents with signs and symptoms consistent with chronic R hip pain and weakness s/p R OPEN REPAIR R GLUTEUS MEDIUS gluteal repair on 12/05/2021. Patient presents with significant pain, muscle performance (strength/power/endurance), motor control, balance, and activity tolerance impairments that are limiting ability to complete her usual activities including those that involve ascending/descending stairs, walking  more than 5K steps, exercising and being active, going to the beach, walking the dogs, transfers,carrying  things, grocery shopping, lifting, heavy cleaning, cooking without difficulty. Patient has previously had difficulty tolerating strengthening progressions except when progressed very slowly. Recommend visit frequency of 1x per week as tolerated by patient. Patient will benefit from skilled physical therapy intervention to address current body structure impairments and activity limitations to improve function and work towards goals set in current POC in order to return to prior level of function or maximal functional improvement.      OBJECTIVE IMPAIRMENTS: Abnormal gait, decreased activity tolerance, decreased balance, decreased coordination, decreased endurance, decreased knowledge of condition, decreased mobility, difficulty walking, decreased strength, impaired perceived functional ability, increased muscle spasms, improper body mechanics, and pain.    ACTIVITY LIMITATIONS: carrying, lifting, bending, standing, squatting, sleeping, stairs, and transfers   PARTICIPATION LIMITATIONS: meal prep, cleaning, laundry, interpersonal relationship, shopping, community activity, and   usual activities including those that involve ascending/descending stairs, walking more than 5K steps, exercising and being active, going to the beach, walking the dogs, transfers,carrying things, grocery shopping, lifting, heavy cleaning, cooking   PERSONAL FACTORS: Fitness, Past/current experiences, Time since onset of injury/illness/exacerbation, and 3+ comorbidities:   chronic back pain, chronic neck pain, hypermobile in several joints, GERD, fatigue, depression/anxiety, CAD (smallest artery in heart - no stent recommended - sees cardiologist no restrictions), skin cancer (cleared up a few years ago), chronic pain patient, on keto diet for about 1.5 years are also affecting patient's functional outcome.    REHAB POTENTIAL:  Good   CLINICAL DECISION MAKING: Evolving/moderate complexity   EVALUATION COMPLEXITY: Moderate     GOALS: Goals reviewed with patient? No   SHORT TERM GOALS: Target date: 08/22/2022   Patient will be independent with initial home exercise program for self-management of symptoms. Baseline: Initial HEP provided at IE (08/07/22); Goal status: MET     LONG TERM GOALS: Target date: 10/30/2022   Patient will be independent with a long-term home exercise program for self-management of symptoms.  Baseline: Initial HEP provided at IE (08/07/22); Goal status: In-progress   2.  Patient will demonstrate improved FOTO to equal or greater than 57 by visit #15 to demonstrate improvement in overall condition and self-reported functional ability.  Baseline: 37 (08/07/22); 51 at visit #5 (08/31/2022);  Goal status: In-progress   3.  Patient will complete 1x10 consecutive single leg runner's step up on R LE to 8 inch or higher step with no UE support or increase in pain to demonstrate improved strength and balance to climb stairs.  Baseline: pain with single step up to 6 inch step (08/07/22); Goal status: In-progress   4.  Patient will balance equal or greater than 60 seconds on R single leg stance without trendelenburg to demonstrate improved functional strength of right lateral hip for improved walking.  Baseline: 25 seconds with trendelenburg (08/07/22); Goal status: In-progress   5.  Patient will complete community, work and/or recreational activities without limitation due to current condition.  Baseline: difficulty with usual activities including those that involve ascending/descending stairs, walking more than 5K steps, exercising and being active, going to the beach, walking the dogs, transfers,carrying things, grocery shopping, lifting, heavy cleaning, cooking (08/07/22); Goal status: In-progress     PLAN:   PT FREQUENCY: 1-2x/week   PT DURATION: 12 weeks   PLANNED INTERVENTIONS:  Therapeutic exercises, Therapeutic activity, Neuromuscular re-education, Balance training, Gait training, Patient/Family education, Self Care, Joint mobilization, Stair training, DME instructions, Dry Needling, Electrical stimulation, Spinal mobilization, Cryotherapy, Moist heat, Manual therapy, and Re-evaluation   PLAN FOR NEXT SESSION: gently  progressive strengthening focusing on R hip and functional strength, balance, manual therapy as needed.      Nancy Nordmann, PT, DPT 09/13/2022, 3:37 PM   [facility] [address] [place], [place] [address] P: [phone] I F: [phone]

## 2022-09-18 ENCOUNTER — Encounter: Payer: Managed Care, Other (non HMO) | Admitting: Physical Therapy

## 2022-09-20 ENCOUNTER — Ambulatory Visit: Payer: Managed Care, Other (non HMO) | Admitting: Physical Therapy

## 2022-09-20 ENCOUNTER — Encounter: Payer: Self-pay | Admitting: Physical Therapy

## 2022-09-20 DIAGNOSIS — R262 Difficulty in walking, not elsewhere classified: Secondary | ICD-10-CM

## 2022-09-20 DIAGNOSIS — M25551 Pain in right hip: Secondary | ICD-10-CM | POA: Diagnosis not present

## 2022-09-20 DIAGNOSIS — M6281 Muscle weakness (generalized): Secondary | ICD-10-CM

## 2022-09-20 NOTE — Therapy (Signed)
OUTPATIENT PHYSICAL THERAPY TREATMENT NOTE   Patient Name: Carolyn Esparza MRN: [id number] DOB:June 01, 1966, 57 y.o., female Today's Date: 09/20/2022  PCP: Carollee Leitz, MD REFERRING PROVIDER: Charlcie Cradle, MD  END OF SESSION:   PT End of Session - 09/20/22 1434     Visit Number 8    Number of Visits 24    Date for PT Re-Evaluation 10/30/22    Authorization Type [employer] reporting period from 04/24/2022    Authorization Time Period VL: 73 per calendar year    Authorization - Visit Number 8    Authorization - Number of Visits 60    Progress Note Due on Visit 10    PT Start Time 1433    PT Stop Time 1511    PT Time Calculation (min) 38 min    Activity Tolerance Patient tolerated treatment well;Patient limited by fatigue;Patient limited by pain    Behavior During Therapy [facility] for tasks assessed/performed              Past Medical History:  Diagnosis Date   Anxiety    ASCUS with positive high risk HPV cervical    CAD (coronary artery disease)    Chicken pox    Chronic pain    neck, back, b/l hips    Chronic pain of right hip [id number]   Complication of anesthesia    DDD (degenerative disc disease), lumbar 01/06/2021   Depression    Facet arthritis of lumbar region 01/06/2021   GERD (gastroesophageal reflux disease)    History of Crohn's disease    Hyperlipidemia    Left ovarian cyst    s/p removal of 1 ovary ? which one removed per pt    Libido, decreased    Skin cancer    reports skin cancer removed from skin in 2016 or 2017   Thyroid nodule    repeat US resolved and Ct neck 05/2019 normal thyroid    Past Surgical History:  Procedure Laterality Date   ABDOMINAL HYSTERECTOMY     APPENDECTOMY  2004   BLADDER SURGERY     BREAST BIOPSY     x 2   COLONOSCOPY     COLONOSCOPY WITH PROPOFOL N/A 10/25/2020   Procedure: COLONOSCOPY WITH PROPOFOL;  Surgeon: Mauri Pole, MD;  Location: [facility];  Service: Endoscopy;  Laterality: N/A;   HEMOSTASIS CLIP  PLACEMENT  10/25/2020   Procedure: HEMOSTASIS CLIP PLACEMENT;  Surgeon: Mauri Pole, MD;  Location: [facility];  Service: Endoscopy;;   OOPHORECTOMY Right    Unilateral Right (per MRI 04/17/2018)   POLYPECTOMY  10/25/2020   Procedure: POLYPECTOMY;  Surgeon: Mauri Pole, MD;  Location: [facility];  Service: Endoscopy;;   SUBMUCOSAL LIFTING INJECTION  10/25/2020   Procedure: SUBMUCOSAL LIFTING INJECTION;  Surgeon: Mauri Pole, MD;  Location: [facility];  Service: Endoscopy;;   WISDOM TOOTH EXTRACTION     Patient Active Problem List   Diagnosis Date Noted   Cervicalgia 07/01/2021   Low back pain 07/01/2021   Herpes infection 07/01/2021   Chronic pain of right hip 04/28/2021   DDD (degenerative disc disease), lumbar 01/06/2021   Facet arthritis of lumbar region 01/06/2021   Trochanteric bursitis of both hips 11/30/2020   Special screening for malignant neoplasms, colon    Polyp of cecum    Pain in both feet 04/12/2020   Burning sensation of feet 04/12/2020   Pain of both hip joints 04/12/2020   Onychomycosis 04/12/2020   Fibrocystic breast disease (FCBD) 04/12/2020  Numbness in both legs 09/09/2019   Spinal stenosis in cervical region 05/09/2019   Facet arthropathy, cervical 05/09/2019   Chronic pain 11/27/2018   Obesity (BMI 30.0-34.9) 11/27/2018   Sprain and strain of hip and thigh 05/10/2018   Abnormal MRI, cervical spine 03/28/2018   Menopause 11/26/2017   Right upper quadrant pain 11/26/2017   Abnormal Pap smear of cervix 07/27/2017   Vasomotor flushing 07/19/2017   Depression 07/18/2017   Nausea 06/25/2017   Chronic pain of both hips 06/25/2017   OSA (obstructive sleep apnea) 04/18/2017   Annual physical exam 04/10/2017   Fatigue 12/16/2016   Chronic back pain 05/30/2016   Abdominal pain 03/12/2016   Anxiety and depression 12/28/2015   History of obstructive sleep apnea 12/15/2015   GERD (gastroesophageal reflux disease) 12/15/2015    Hyperlipidemia 12/15/2015   Pain medication agreement signed 03/25/2015   CAD in native artery 02/05/2015   Right supraspinatus tenosynovitis 08/17/2014   Crohn's disease involving terminal ileum ([place]) 03/30/2014   FH: colon polyps 03/30/2014   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 female who presents to outpatient physical therapy with a referral for medical diagnosis tear of right gluteus medius tendon. This patient's chief complaints consist of right hip weakness and pain s/p OPEN REPAIR R GLUTEUS MEDIUS gluteal repair 12/05/2021, leading to the following functional deficits: difficulty with usual activities including those that involve ascending/descending stairs, walking more than 5K steps, exercising and being active, going to the beach, walking the dogs, transfers,carrying things, grocery shopping, lifting, heavy cleaning, cooking. 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  Patient denies hx of stroke, seizures, lung problems, diabetes, unexplained weight loss, unexplained changes in bowel or bladder problems, unexplained stumbling or dropping things, osteoporosis, and spinal surgery.    PRECAUTIONS: None official, but "be careful" because he had another patient who went to the weight training at the gym  and re-tore it.   SUBJECTIVE:  SUBJECTIVE STATEMENT:  Patient reports she is okay. She went to a show over the weekend and she had to do steps. She noticed she still doesn't feel comfortable with steps. She also has a questions about her exercises.    PAIN:  Are you having pain? NPRS 0/10 R hip   OBJECTIVE   TODAY'S TREATMENT:  Therapeutic exercise: to centralize symptoms and improve ROM, strength, muscular endurance, and activity tolerance required for successful completion of functional activities.  - step up variations to trouble shoot why patient is feeling unsteady on stairs. Completed variations of lateral step down, heel tap, and forwards step up and over with 4-12 inc steps. (Unable to control - reverse lunge with contralateral UE support, 2x10  with R foot forwards.  - running man with U UE support 1x10 L side, 1x5 R side (too fatigued, knee pain).  - modified hollow hold with B LE in table top and shoulders flexed 90 degrees with full IR, 5x30 seconds. Attempted extending one LE towards the hover position, but unable to hold throughout entire 30 seconds.  - lateral stepping with GTB around forefeet and crossed in front of body, 2x15 feet each direction.    Pt required multimodal cuing for proper technique and to facilitate improved neuromuscular control, strength, range of motion, and functional ability resulting in improved performance and form.     PATIENT EDUCATION:  Education details: Exercise purpose/form. Self management techniques. Education on HEP including handout.  Reviewed cancelation/no-show policy with patient and confirmed patient has correct phone number for clinic; patient verbalized understanding (08/07/22). Person educated: Patient Education method: Explanation, Demonstration,  Tactile cues, and Verbal cues Education comprehension: verbalized understanding, returned demonstration, verbal cues required, and needs further education   HOME EXERCISE PROGRAM: Access Code: [email or web address] URL: [email or web address] Date: 09/20/2022 Prepared by: Rosita Kea  Exercises - Sidelying Hip Abduction  - 1 x daily - 2-3 sets - 8 reps - Standing Hip Abduction with Counter Support  - 1 x daily - 2-3 sets - 10 reps - 2 seconds hold - Runner's Step Up/Down  - 1 x daily - 3 sets - 10 reps - The Hundred 3 Intermediate - Table Top  - 1 x daily - 5 reps - 20 seconds hold - Reverse Lunge  - 1 x daily - 2-3 sets - 10 reps - Side stepping with band  - 1 x daily - 2-3 sets - 10-15 feet distance   ASSESSMENT:   CLINICAL IMPRESSION: Patient arrives with concerns about stability on stairs after going for a trip to [place] over the weekend. Today's session focused on progressing exercises with special attention to improving stair navigation. Patient has limitations in strength, balance, and muscular endurance that decreases her eccentric control and single leg balance on steps. Progressed HEP and will monitor for tolerance to new exercises over time at next session. Patient would benefit from continued management of limiting condition by skilled physical therapist to address remaining impairments and functional limitations to work towards stated goals and return to PLOF or maximal functional independence.   From Initial Eval 08/07/2021:  Patient is a 57 y.o. female referred to outpatient physical therapy with a medical diagnosis of tear of right gluteus medius tendon who presents with signs and symptoms consistent with chronic R hip pain and weakness s/p R OPEN REPAIR R GLUTEUS MEDIUS gluteal repair on 12/05/2021. Patient presents with significant pain, muscle performance (strength/power/endurance), motor control, balance, and activity tolerance impairments that are limiting ability to  complete her  usual activities including those that involve ascending/descending stairs, walking more than 5K steps, exercising and being active, going to the beach, walking the dogs, transfers,carrying things, grocery shopping, lifting, heavy cleaning, cooking without difficulty. Patient has previously had difficulty tolerating strengthening progressions except when progressed very slowly. Recommend visit frequency of 1x per week as tolerated by patient. Patient will benefit from skilled physical therapy intervention to address current body structure impairments and activity limitations to improve function and work towards goals set in current POC in order to return to prior level of function or maximal functional improvement.      OBJECTIVE IMPAIRMENTS: Abnormal gait, decreased activity tolerance, decreased balance, decreased coordination, decreased endurance, decreased knowledge of condition, decreased mobility, difficulty walking, decreased strength, impaired perceived functional ability, increased muscle spasms, improper body mechanics, and pain.    ACTIVITY LIMITATIONS: carrying, lifting, bending, standing, squatting, sleeping, stairs, and transfers   PARTICIPATION LIMITATIONS: meal prep, cleaning, laundry, interpersonal relationship, shopping, community activity, and   usual activities including those that involve ascending/descending stairs, walking more than 5K steps, exercising and being active, going to the beach, walking the dogs, transfers,carrying things, grocery shopping, lifting, heavy cleaning, cooking   PERSONAL FACTORS: Fitness, Past/current experiences, Time since onset of injury/illness/exacerbation, and 3+ comorbidities:   chronic back pain, chronic neck pain, hypermobile in several joints, GERD, fatigue, depression/anxiety, CAD (smallest artery in heart - no stent recommended - sees cardiologist no restrictions), skin cancer (cleared up a few years ago), chronic pain patient, on keto  diet for about 1.5 years are also affecting patient's functional outcome.    REHAB POTENTIAL: Good   CLINICAL DECISION MAKING: Evolving/moderate complexity   EVALUATION COMPLEXITY: Moderate     GOALS: Goals reviewed with patient? No   SHORT TERM GOALS: Target date: 08/22/2022   Patient will be independent with initial home exercise program for self-management of symptoms. Baseline: Initial HEP provided at IE (08/07/22); Goal status: MET     LONG TERM GOALS: Target date: 10/30/2022   Patient will be independent with a long-term home exercise program for self-management of symptoms.  Baseline: Initial HEP provided at IE (08/07/22); Goal status: In-progress   2.  Patient will demonstrate improved FOTO to equal or greater than 57 by visit #15 to demonstrate improvement in overall condition and self-reported functional ability.  Baseline: 37 (08/07/22); 51 at visit #5 (08/31/2022);  Goal status: In-progress   3.  Patient will complete 1x10 consecutive single leg runner's step up on R LE to 8 inch or higher step with no UE support or increase in pain to demonstrate improved strength and balance to climb stairs.  Baseline: pain with single step up to 6 inch step (08/07/22); Goal status: In-progress   4.  Patient will balance equal or greater than 60 seconds on R single leg stance without trendelenburg to demonstrate improved functional strength of right lateral hip for improved walking.  Baseline: 25 seconds with trendelenburg (08/07/22); Goal status: In-progress   5.  Patient will complete community, work and/or recreational activities without limitation due to current condition.  Baseline: difficulty with usual activities including those that involve ascending/descending stairs, walking more than 5K steps, exercising and being active, going to the beach, walking the dogs, transfers,carrying things, grocery shopping, lifting, heavy cleaning, cooking (08/07/22); Goal status: In-progress      PLAN:   PT FREQUENCY: 1-2x/week   PT DURATION: 12 weeks   PLANNED INTERVENTIONS: Therapeutic exercises, Therapeutic activity, Neuromuscular re-education, Balance training, Gait training, Patient/Family education, Self Care, Joint  mobilization, Stair training, DME instructions, Dry Needling, Electrical stimulation, Spinal mobilization, Cryotherapy, Moist heat, Manual therapy, and Re-evaluation   PLAN FOR NEXT SESSION: gently progressive strengthening focusing on R hip and functional strength, balance, manual therapy as needed.      Nancy Nordmann, PT, DPT 09/20/2022, 3:17 PM   [facility] [address] [place], [place] [address] P: [phone] I F: [phone]

## 2022-09-25 ENCOUNTER — Encounter: Payer: Managed Care, Other (non HMO) | Admitting: Physical Therapy

## 2022-09-27 ENCOUNTER — Ambulatory Visit: Payer: Managed Care, Other (non HMO) | Admitting: Physical Therapy

## 2022-09-27 ENCOUNTER — Encounter: Payer: Self-pay | Admitting: Physical Therapy

## 2022-09-27 DIAGNOSIS — M25551 Pain in right hip: Secondary | ICD-10-CM

## 2022-09-27 DIAGNOSIS — R262 Difficulty in walking, not elsewhere classified: Secondary | ICD-10-CM

## 2022-09-27 DIAGNOSIS — M6281 Muscle weakness (generalized): Secondary | ICD-10-CM

## 2022-09-27 NOTE — Therapy (Signed)
OUTPATIENT PHYSICAL THERAPY TREATMENT NOTE   Patient Name: Myrtha Holderby MRN: [id number] DOB:1966/02/25, 57 y.o., female Today's Date: 09/27/2022  PCP: Carollee Leitz, MD REFERRING PROVIDER: Charlcie Cradle, MD  END OF SESSION:   PT End of Session - 09/27/22 1306     Visit Number 9    Number of Visits 24    Date for PT Re-Evaluation 10/30/22    Authorization Type [employer] reporting period from 04/24/2022    Authorization Time Period VL: 30 per calendar year    Authorization - Visit Number 9    Authorization - Number of Visits 60    Progress Note Due on Visit 10    PT Start Time 1305    PT Stop Time 1343    PT Time Calculation (min) 38 min    Activity Tolerance Patient tolerated treatment well;Patient limited by fatigue;Patient limited by pain    Behavior During Therapy [facility] for tasks assessed/performed               Past Medical History:  Diagnosis Date   Anxiety    ASCUS with positive high risk HPV cervical    CAD (coronary artery disease)    Chicken pox    Chronic pain    neck, back, b/l hips    Chronic pain of right hip [id number]   Complication of anesthesia    DDD (degenerative disc disease), lumbar 01/06/2021   Depression    Facet arthritis of lumbar region 01/06/2021   GERD (gastroesophageal reflux disease)    History of Crohn's disease    Hyperlipidemia    Left ovarian cyst    s/p removal of 1 ovary ? which one removed per pt    Libido, decreased    Skin cancer    reports skin cancer removed from skin in 2016 or 2017   Thyroid nodule    repeat US resolved and Ct neck 05/2019 normal thyroid    Past Surgical History:  Procedure Laterality Date   ABDOMINAL HYSTERECTOMY     APPENDECTOMY  2004   BLADDER SURGERY     BREAST BIOPSY     x 2   COLONOSCOPY     COLONOSCOPY WITH PROPOFOL N/A 10/25/2020   Procedure: COLONOSCOPY WITH PROPOFOL;  Surgeon: Mauri Pole, MD;  Location: [facility];  Service: Endoscopy;  Laterality: N/A;   HEMOSTASIS CLIP  PLACEMENT  10/25/2020   Procedure: HEMOSTASIS CLIP PLACEMENT;  Surgeon: Mauri Pole, MD;  Location: [facility];  Service: Endoscopy;;   OOPHORECTOMY Right    Unilateral Right (per MRI 04/17/2018)   POLYPECTOMY  10/25/2020   Procedure: POLYPECTOMY;  Surgeon: Mauri Pole, MD;  Location: [facility];  Service: Endoscopy;;   SUBMUCOSAL LIFTING INJECTION  10/25/2020   Procedure: SUBMUCOSAL LIFTING INJECTION;  Surgeon: Mauri Pole, MD;  Location: [facility];  Service: Endoscopy;;   WISDOM TOOTH EXTRACTION     Patient Active Problem List   Diagnosis Date Noted   Cervicalgia 07/01/2021   Low back pain 07/01/2021   Herpes infection 07/01/2021   Chronic pain of right hip 04/28/2021   DDD (degenerative disc disease), lumbar 01/06/2021   Facet arthritis of lumbar region 01/06/2021   Trochanteric bursitis of both hips 11/30/2020   Special screening for malignant neoplasms, colon    Polyp of cecum    Pain in both feet 04/12/2020   Burning sensation of feet 04/12/2020   Pain of both hip joints 04/12/2020   Onychomycosis 04/12/2020   Fibrocystic breast disease (FCBD) 04/12/2020  Numbness in both legs 09/09/2019   Spinal stenosis in cervical region 05/09/2019   Facet arthropathy, cervical 05/09/2019   Chronic pain 11/27/2018   Obesity (BMI 30.0-34.9) 11/27/2018   Sprain and strain of hip and thigh 05/10/2018   Abnormal MRI, cervical spine 03/28/2018   Menopause 11/26/2017   Right upper quadrant pain 11/26/2017   Abnormal Pap smear of cervix 07/27/2017   Vasomotor flushing 07/19/2017   Depression 07/18/2017   Nausea 06/25/2017   Chronic pain of both hips 06/25/2017   OSA (obstructive sleep apnea) 04/18/2017   Annual physical exam 04/10/2017   Fatigue 12/16/2016   Chronic back pain 05/30/2016   Abdominal pain 03/12/2016   Anxiety and depression 12/28/2015   History of obstructive sleep apnea 12/15/2015   GERD (gastroesophageal reflux disease) 12/15/2015    Hyperlipidemia 12/15/2015   Pain medication agreement signed 03/25/2015   CAD in native artery 02/05/2015   Right supraspinatus tenosynovitis 08/17/2014   Crohn's disease involving terminal ileum ([place]) 03/30/2014   FH: colon polyps 03/30/2014   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 female who presents to outpatient physical therapy with a referral for medical diagnosis tear of right gluteus medius tendon. This patient's chief complaints consist of right hip weakness and pain s/p OPEN REPAIR R GLUTEUS MEDIUS gluteal repair 12/05/2021, leading to the following functional deficits: difficulty with usual activities including those that involve ascending/descending stairs, walking more than 5K steps, exercising and being active, going to the beach, walking the dogs, transfers,carrying things, grocery shopping, lifting, heavy cleaning, cooking. 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  Patient denies hx of stroke, seizures, lung problems, diabetes, unexplained weight loss, unexplained changes in bowel or bladder problems, unexplained stumbling or dropping things, osteoporosis, and spinal surgery.    PRECAUTIONS: None official, but "be careful" because he had another patient who went to the weight training at the gym  and re-tore it.   SUBJECTIVE:  SUBJECTIVE STATEMENT:  Patient reports she is feeling well today. She states needed the day off from HEP after last PT session.    PAIN:  Are you having pain? NPRS 0/10 R hip   OBJECTIVE   TODAY'S TREATMENT:  Therapeutic exercise: to centralize symptoms and improve ROM, strength, muscular endurance, and activity tolerance required for successful completion of functional activities.  - lateral stepping with GTB around forefeet and crossed in front of body, 3x 20 steps each direction. - reverse lunge with contralateral UE support, 1x10 L foot forwards, 3x10 R foot forwards. Cuing for free hip hand on hip and less UE support.  - running man with U UE support 5x4-5 each side. Touchdown UE support  - standing hip adduction sliding furnature slider laterally and back with B UE support and knees extended (but not hyperextended). 2x10 each side.    Pt required multimodal cuing for proper technique and to facilitate improved neuromuscular control, strength, range of motion, and functional ability resulting in improved performance and form.     PATIENT EDUCATION:  Education details: Exercise purpose/form. Self management techniques. Education on HEP including handout.  Reviewed cancelation/no-show policy with patient and confirmed patient has correct phone number for clinic; patient verbalized understanding (08/07/22). Person educated: Patient Education method: Explanation, Demonstration, Tactile cues, and Verbal cues Education comprehension: verbalized understanding, returned demonstration, verbal cues required, and needs further education   HOME EXERCISE PROGRAM: Access Code: [email or web address] URL: [email or web address] Date: 09/20/2022 Prepared by: Rosita Kea  Exercises -  Sidelying Hip Abduction  - 1 x daily - 2-3 sets - 8 reps - Standing Hip Abduction with Counter Support  - 1 x daily - 2-3 sets - 10 reps - 2 seconds hold - Runner's Step Up/Down  - 1 x daily - 3 sets - 10 reps - The Hundred 3 Intermediate - Table Top  - 1 x daily - 5 reps - 20 seconds hold - Reverse Lunge  - 1 x daily - 2-3 sets - 10 reps - Side stepping with band  - 1 x daily - 2-3 sets - 10-15 feet distance   ASSESSMENT:   CLINICAL IMPRESSION: Patient arrives with good tolerance to progressions at last PT session and success with partial performance of new exercises in HEP. Today's session continued with strengthening exercises for improved R hip strength. Patient demonstrates improving strength and tolerance but continues to have significant R hip weakness and poor stabilization and muscular endurance at the right hip. Plan to continue with gradual progression in addressing deficits in R hip to patient tolerance. Patient would benefit from continued management of limiting condition by skilled physical therapist to address remaining impairments and functional limitations to work towards stated goals and return to PLOF or maximal functional independence.  From Initial Eval 08/07/2021:  Patient is a 57 y.o. female referred to outpatient physical therapy with a medical diagnosis of tear of right gluteus medius tendon who presents with signs and symptoms consistent with chronic R hip pain and weakness s/p R OPEN REPAIR R GLUTEUS MEDIUS gluteal repair on 12/05/2021. Patient presents with significant pain, muscle performance (strength/power/endurance), motor control, balance, and activity tolerance impairments that are limiting ability to complete her usual activities including those that involve ascending/descending stairs, walking more than 5K steps, exercising and being active, going to the beach, walking the dogs, transfers,carrying things, grocery shopping, lifting, heavy cleaning, cooking without  difficulty. Patient has previously had difficulty tolerating strengthening progressions except when progressed very slowly. Recommend visit frequency of 1x  per week as tolerated by patient. Patient will benefit from skilled physical therapy intervention to address current body structure impairments and activity limitations to improve function and work towards goals set in current POC in order to return to prior level of function or maximal functional improvement.      OBJECTIVE IMPAIRMENTS: Abnormal gait, decreased activity tolerance, decreased balance, decreased coordination, decreased endurance, decreased knowledge of condition, decreased mobility, difficulty walking, decreased strength, impaired perceived functional ability, increased muscle spasms, improper body mechanics, and pain.    ACTIVITY LIMITATIONS: carrying, lifting, bending, standing, squatting, sleeping, stairs, and transfers   PARTICIPATION LIMITATIONS: meal prep, cleaning, laundry, interpersonal relationship, shopping, community activity, and   usual activities including those that involve ascending/descending stairs, walking more than 5K steps, exercising and being active, going to the beach, walking the dogs, transfers,carrying things, grocery shopping, lifting, heavy cleaning, cooking   PERSONAL FACTORS: Fitness, Past/current experiences, Time since onset of injury/illness/exacerbation, and 3+ comorbidities:   chronic back pain, chronic neck pain, hypermobile in several joints, GERD, fatigue, depression/anxiety, CAD (smallest artery in heart - no stent recommended - sees cardiologist no restrictions), skin cancer (cleared up a few years ago), chronic pain patient, on keto diet for about 1.5 years are also affecting patient's functional outcome.    REHAB POTENTIAL: Good   CLINICAL DECISION MAKING: Evolving/moderate complexity   EVALUATION COMPLEXITY: Moderate     GOALS: Goals reviewed with patient? No   SHORT TERM GOALS:  Target date: 08/22/2022   Patient will be independent with initial home exercise program for self-management of symptoms. Baseline: Initial HEP provided at IE (08/07/22); Goal status: MET     LONG TERM GOALS: Target date: 10/30/2022   Patient will be independent with a long-term home exercise program for self-management of symptoms.  Baseline: Initial HEP provided at IE (08/07/22); Goal status: In-progress   2.  Patient will demonstrate improved FOTO to equal or greater than 57 by visit #15 to demonstrate improvement in overall condition and self-reported functional ability.  Baseline: 37 (08/07/22); 51 at visit #5 (08/31/2022);  Goal status: In-progress   3.  Patient will complete 1x10 consecutive single leg runner's step up on R LE to 8 inch or higher step with no UE support or increase in pain to demonstrate improved strength and balance to climb stairs.  Baseline: pain with single step up to 6 inch step (08/07/22); Goal status: In-progress   4.  Patient will balance equal or greater than 60 seconds on R single leg stance without trendelenburg to demonstrate improved functional strength of right lateral hip for improved walking.  Baseline: 25 seconds with trendelenburg (08/07/22); Goal status: In-progress   5.  Patient will complete community, work and/or recreational activities without limitation due to current condition.  Baseline: difficulty with usual activities including those that involve ascending/descending stairs, walking more than 5K steps, exercising and being active, going to the beach, walking the dogs, transfers,carrying things, grocery shopping, lifting, heavy cleaning, cooking (08/07/22); Goal status: In-progress     PLAN:   PT FREQUENCY: 1-2x/week   PT DURATION: 12 weeks   PLANNED INTERVENTIONS: Therapeutic exercises, Therapeutic activity, Neuromuscular re-education, Balance training, Gait training, Patient/Family education, Self Care, Joint mobilization, Stair  training, DME instructions, Dry Needling, Electrical stimulation, Spinal mobilization, Cryotherapy, Moist heat, Manual therapy, and Re-evaluation   PLAN FOR NEXT SESSION: gently progressive strengthening focusing on R hip and functional strength, balance, manual therapy as needed.      Nancy Nordmann, PT, DPT 09/27/2022, 1:48 PM  [facility] [address] [place], [place] [address] P: [phone] I F: [phone]

## 2022-10-01 ENCOUNTER — Encounter: Payer: Self-pay | Admitting: Family Medicine

## 2022-10-04 ENCOUNTER — Encounter: Payer: Self-pay | Admitting: Physical Therapy

## 2022-10-04 ENCOUNTER — Ambulatory Visit: Payer: Managed Care, Other (non HMO) | Attending: Orthopedic Surgery | Admitting: Physical Therapy

## 2022-10-04 DIAGNOSIS — R262 Difficulty in walking, not elsewhere classified: Secondary | ICD-10-CM | POA: Diagnosis present

## 2022-10-04 DIAGNOSIS — M6281 Muscle weakness (generalized): Secondary | ICD-10-CM | POA: Insufficient documentation

## 2022-10-04 DIAGNOSIS — M25551 Pain in right hip: Secondary | ICD-10-CM | POA: Insufficient documentation

## 2022-10-04 NOTE — Therapy (Signed)
OUTPATIENT PHYSICAL THERAPY TREATMENT NOTE / PROGRESS NOTE / RE-CERTIFICATION Dates of reporting from 08/07/2022 to 10/04/2022   Patient Name: Carolyn Esparza MRN: [id number] DOB:1965/11/28, 57 y.o., female Today's Date: 10/04/2022  PCP: Carollee Leitz, MD REFERRING PROVIDER: Charlcie Cradle, MD  END OF SESSION:   PT End of Session - 10/04/22 1417     Visit Number 10    Number of Visits 24    Date for PT Re-Evaluation 12/27/22    Authorization Type [employer] reporting period from 08/07/2022    Authorization Time Period VL: 4 per calendar year    Authorization - Visit Number 10    Authorization - Number of Visits 60    Progress Note Due on Visit 10    PT Start Time 1345    PT Stop Time 1425    PT Time Calculation (min) 40 min    Activity Tolerance Patient tolerated treatment well;Patient limited by fatigue;Patient limited by pain    Behavior During Therapy [facility] for tasks assessed/performed                Past Medical History:  Diagnosis Date   Anxiety    ASCUS with positive high risk HPV cervical    CAD (coronary artery disease)    Chicken pox    Chronic pain    neck, back, b/l hips    Chronic pain of right hip [id number]   Complication of anesthesia    DDD (degenerative disc disease), lumbar 01/06/2021   Depression    Facet arthritis of lumbar region 01/06/2021   GERD (gastroesophageal reflux disease)    History of Crohn's disease    Hyperlipidemia    Left ovarian cyst    s/p removal of 1 ovary ? which one removed per pt    Libido, decreased    Skin cancer    reports skin cancer removed from skin in 2016 or 2017   Thyroid nodule    repeat US resolved and Ct neck 05/2019 normal thyroid    Past Surgical History:  Procedure Laterality Date   ABDOMINAL HYSTERECTOMY     APPENDECTOMY  2004   BLADDER SURGERY     BREAST BIOPSY     x 2   COLONOSCOPY     COLONOSCOPY WITH PROPOFOL N/A 10/25/2020   Procedure: COLONOSCOPY WITH PROPOFOL;  Surgeon: Mauri Pole, MD;   Location: [facility];  Service: Endoscopy;  Laterality: N/A;   HEMOSTASIS CLIP PLACEMENT  10/25/2020   Procedure: HEMOSTASIS CLIP PLACEMENT;  Surgeon: Mauri Pole, MD;  Location: [facility];  Service: Endoscopy;;   OOPHORECTOMY Right    Unilateral Right (per MRI 04/17/2018)   POLYPECTOMY  10/25/2020   Procedure: POLYPECTOMY;  Surgeon: Mauri Pole, MD;  Location: [facility];  Service: Endoscopy;;   SUBMUCOSAL LIFTING INJECTION  10/25/2020   Procedure: SUBMUCOSAL LIFTING INJECTION;  Surgeon: Mauri Pole, MD;  Location: [facility];  Service: Endoscopy;;   WISDOM TOOTH EXTRACTION     Patient Active Problem List   Diagnosis Date Noted   Cervicalgia 07/01/2021   Low back pain 07/01/2021   Herpes infection 07/01/2021   Chronic pain of right hip 04/28/2021   DDD (degenerative disc disease), lumbar 01/06/2021   Facet arthritis of lumbar region 01/06/2021   Trochanteric bursitis of both hips 11/30/2020   Special screening for malignant neoplasms, colon    Polyp of cecum    Pain in both feet 04/12/2020   Burning sensation of feet 04/12/2020   Pain of both hip  joints 04/12/2020   Onychomycosis 04/12/2020   Fibrocystic breast disease (FCBD) 04/12/2020   Numbness in both legs 09/09/2019   Spinal stenosis in cervical region 05/09/2019   Facet arthropathy, cervical 05/09/2019   Chronic pain 11/27/2018   Obesity (BMI 30.0-34.9) 11/27/2018   Sprain and strain of hip and thigh 05/10/2018   Abnormal MRI, cervical spine 03/28/2018   Menopause 11/26/2017   Right upper quadrant pain 11/26/2017   Abnormal Pap smear of cervix 07/27/2017   Vasomotor flushing 07/19/2017   Depression 07/18/2017   Nausea 06/25/2017   Chronic pain of both hips 06/25/2017   OSA (obstructive sleep apnea) 04/18/2017   Annual physical exam 04/10/2017   Fatigue 12/16/2016   Chronic back pain 05/30/2016   Abdominal pain 03/12/2016   Anxiety and depression 12/28/2015   History of obstructive  sleep apnea 12/15/2015   GERD (gastroesophageal reflux disease) 12/15/2015   Hyperlipidemia 12/15/2015   Pain medication agreement signed 03/25/2015   CAD in native artery 02/05/2015   Right supraspinatus tenosynovitis 08/17/2014   Crohn's disease involving terminal ileum ([place]) 03/30/2014   FH: colon polyps 03/30/2014   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 female who presents to outpatient physical therapy with a referral for medical diagnosis tear of right gluteus medius tendon. This patient's chief complaints consist of right hip weakness and pain s/p OPEN REPAIR R GLUTEUS MEDIUS gluteal repair 12/05/2021, leading to the following functional deficits: difficulty with usual activities including those that involve ascending/descending stairs, walking more than 5K steps, exercising and being active, going to the beach, walking the dogs, transfers,carrying things, grocery shopping, lifting, heavy cleaning, cooking. 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  Patient denies hx of stroke, seizures, lung problems, diabetes, unexplained weight loss, unexplained changes in bowel or bladder problems, unexplained stumbling or dropping things, osteoporosis, and spinal surgery.    PRECAUTIONS: None official, but "be  careful" because he had another patient who went to the weight training at the gym and re-tore it.   SUBJECTIVE:  SUBJECTIVE STATEMENT:  Patient reports her right hip is sore today. She thinks she over-did it walking since last PT session. She was not thinking about it and her R hip started to bother her and she checked her fit-bit and she was over 10K steps. Patient states she was a little sore after last PT session so she skipped her exercises the next day. She had a couple of days where she skipped her exercises because she walked her dogs. The day she took so many steps was when she went shopping and walked the dogs.   PAIN:  Are you having pain? NPRS 3/10 R hip when walking and moving.    OBJECTIVE  SELF-REPORTED FUNCTION FOTO score: 54/100 (hip questionnaire)  FUNCTIONAL/BALANCE TESTS Single leg stance:  L > 60 seconds without trendelenburg; R > 60 seconds with trendelenburg and fatigue symptoms (10/04/2022);   Runner's step up: 1x10 to 8 inch step with no pain or trendelenburg (but some wobbling).    TODAY'S TREATMENT:  Therapeutic exercise: to centralize symptoms and improve ROM, strength, muscular endurance, and activity tolerance required for successful completion of functional activities.  - NuStep level 3 using bilateral upper and lower extremities. Seat/handle setting 6/8. For improved extremity mobility, muscular endurance, and activity tolerance; and to induce the analgesic effect of aerobic exercise, stimulate improved joint nutrition, and prepare body structures and systems for following interventions. x 6  minutes. Average SPM = 64. - testing to update goals and assess progress.  - reverse lunge with contralateral UE support, 1x10 each side with knee to a2z pad, 1x10 each side with knee to 4 inch  yoga block.    Pt required multimodal cuing for proper technique and to facilitate improved neuromuscular control, strength, range of motion, and functional ability resulting in improved performance and form.     PATIENT EDUCATION:  Education details: Exercise purpose/form. Self management techniques. Education on HEP including handout.  Reviewed cancelation/no-show policy with patient and confirmed patient has correct phone number for clinic; patient verbalized understanding (08/07/22). Person educated: Patient Education method: Explanation, Demonstration, Tactile cues, and Verbal cues Education comprehension: verbalized understanding, returned demonstration, verbal cues required, and needs further education   HOME EXERCISE PROGRAM: Access Code: [email or web address] URL: [email or web address] Date: 09/20/2022 Prepared by: Rosita Kea  Exercises - Sidelying Hip Abduction  - 1 x daily - 2-3 sets - 8 reps - Standing Hip Abduction with Counter Support  - 1 x daily - 2-3 sets - 10 reps - 2 seconds hold - Runner's Step Up/Down  - 1 x daily - 3 sets - 10 reps - The Hundred 3 Intermediate - Table Top  - 1 x daily - 5 reps - 20 seconds hold - Reverse Lunge  - 1 x daily - 2-3 sets - 10 reps - Side stepping with band  - 1 x daily - 2-3 sets - 10-15 feet distance   ASSESSMENT:   CLINICAL IMPRESSION: Patient has attended 10 physical therapy sessions since starting the current episode of care. She has been able to attend PT at a frequency of once a week and is making steady progress towards goals. She has nearly met her FOTO goal, she has met her step up goal, and has made good progress towards SLS goal. She reports improvements in functional activity tolerance but continues to be limited by R hip pain and fatigue if she does not limit or budget her activities. She would benefit from continued physical therapy at a frequency of 1x a week.  From Initial Eval 08/07/2021:  Patient is a 57 y.o.  female referred to outpatient physical therapy with a medical diagnosis of tear of right gluteus medius tendon who presents with signs and symptoms consistent with chronic R hip pain and weakness s/p R OPEN REPAIR R GLUTEUS MEDIUS gluteal repair on 12/05/2021. Patient presents with significant pain, muscle performance (strength/power/endurance), motor control, balance, and activity tolerance impairments that are limiting ability to complete her usual activities including those that involve ascending/descending stairs, walking more than 5K steps, exercising and being active, going to the beach, walking the dogs, transfers,carrying things, grocery shopping, lifting, heavy cleaning, cooking without difficulty. Patient has previously had difficulty tolerating strengthening progressions except when progressed very slowly. Recommend visit frequency of 1x per week as tolerated by patient. Patient will benefit from skilled physical therapy intervention to address current body structure impairments and activity limitations to improve function and work towards goals set in current POC in order to return to prior level of function or maximal functional improvement.      OBJECTIVE IMPAIRMENTS: Abnormal gait, decreased activity tolerance, decreased balance, decreased coordination, decreased endurance, decreased knowledge of condition, decreased mobility, difficulty walking, decreased strength, impaired perceived functional ability, increased muscle spasms, improper body mechanics, and pain.    ACTIVITY LIMITATIONS: carrying, lifting, bending, standing, squatting, sleeping, stairs, and transfers   PARTICIPATION LIMITATIONS: meal prep, cleaning, laundry, interpersonal relationship, shopping, community activity, and   usual activities including those that involve ascending/descending stairs, walking more than 5K steps, exercising and being active, going to the beach, walking the dogs, transfers,carrying things, grocery  shopping, lifting, heavy cleaning, cooking   PERSONAL FACTORS: Fitness, Past/current experiences, Time since onset of injury/illness/exacerbation, and 3+ comorbidities:   chronic back pain, chronic neck pain, hypermobile in several joints, GERD, fatigue, depression/anxiety, CAD (smallest artery in heart - no stent recommended - sees cardiologist no restrictions), skin cancer (cleared up a few years ago), chronic pain patient, on keto diet for about 1.5 years are also affecting patient's functional outcome.    REHAB POTENTIAL: Good   CLINICAL DECISION MAKING: Evolving/moderate complexity   EVALUATION COMPLEXITY: Moderate     GOALS: Goals reviewed with patient? No   SHORT TERM GOALS: Target date: 08/22/2022   Patient will be independent with initial home exercise program for self-management of symptoms. Baseline: Initial HEP provided at IE (08/07/22); Goal status: MET     LONG TERM GOALS: Target date: 10/30/2022. Target date updated to 12/27/2022 for all unmet long term goals on 10/04/2022.    Patient will be independent with a long-term home exercise program for self-management of symptoms.  Baseline: Initial HEP provided at IE (08/07/22); participating regularly (10/04/2022);  Goal status: In-progress   2.  Patient will demonstrate improved FOTO to equal or greater than 57 by visit #15 to demonstrate improvement in overall condition and self-reported functional ability.  Baseline: 37 (08/07/22); 51 at visit #5 (08/31/2022); 54 at visit # 10 (10/04/2022);  Goal status: In-progress   3.  Patient will complete 1x10 consecutive single leg runner's step up on R LE to 8 inch or higher step with no UE support or increase in pain to demonstrate improved strength and balance to climb stairs.  Baseline: pain with single step up to 6 inch step (08/07/22); 1x10 to 8 inch step with no pain or trendelenburg (but some wobbling, 3//2024);  Goal status: MET   4.  Patient will balance equal or greater than 60  seconds on R single leg stance without trendelenburg to  demonstrate improved functional strength of right lateral hip for improved walking.  Baseline: 25 seconds with trendelenburg (08/07/22); > 60 seconds with trendelenburg and fatigue symptoms (10/04/2022);  Goal status: In-progress   5.  Patient will complete community, work and/or recreational activities without limitation due to current condition.  Baseline: difficulty with usual activities including those that involve ascending/descending stairs, walking more than 5K steps, exercising and being active, going to the beach, walking the dogs, transfers,carrying things, grocery shopping, lifting, heavy cleaning, cooking (08/07/22); estimates 75% improvement since starting PT this episode of care (3//2024):  Goal status: In-progress     PLAN:   PT FREQUENCY: 1-2x/week   PT DURATION: 12 weeks   PLANNED INTERVENTIONS: Therapeutic exercises, Therapeutic activity, Neuromuscular re-education, Balance training, Gait training, Patient/Family education, Self Care, Joint mobilization, Stair training, DME instructions, Dry Needling, Electrical stimulation, Spinal mobilization, Cryotherapy, Moist heat, Manual therapy, and Re-evaluation   PLAN FOR NEXT SESSION: gently progressive strengthening focusing on R hip and functional strength, balance, manual therapy as needed.      Nancy Nordmann, PT, DPT 10/04/2022, 2:24 PM   [facility] [address] [place], [place] [address] P: [phone] I F: [phone]

## 2022-10-11 ENCOUNTER — Encounter: Payer: Self-pay | Admitting: Family Medicine

## 2022-10-11 ENCOUNTER — Ambulatory Visit: Payer: Managed Care, Other (non HMO) | Admitting: Family Medicine

## 2022-10-11 VITALS — BP 115/78 | HR 75 | Temp 97.9°F | Ht 63.0 in | Wt 136.6 lb

## 2022-10-11 DIAGNOSIS — M25551 Pain in right hip: Secondary | ICD-10-CM

## 2022-10-11 DIAGNOSIS — M542 Cervicalgia: Secondary | ICD-10-CM

## 2022-10-11 DIAGNOSIS — F39 Unspecified mood [affective] disorder: Secondary | ICD-10-CM

## 2022-10-11 DIAGNOSIS — Z79890 Hormone replacement therapy (postmenopausal): Secondary | ICD-10-CM

## 2022-10-11 DIAGNOSIS — E663 Overweight: Secondary | ICD-10-CM

## 2022-10-11 DIAGNOSIS — K219 Gastro-esophageal reflux disease without esophagitis: Secondary | ICD-10-CM

## 2022-10-11 DIAGNOSIS — F1190 Opioid use, unspecified, uncomplicated: Secondary | ICD-10-CM

## 2022-10-11 DIAGNOSIS — E785 Hyperlipidemia, unspecified: Secondary | ICD-10-CM | POA: Diagnosis not present

## 2022-10-11 DIAGNOSIS — Z23 Encounter for immunization: Secondary | ICD-10-CM

## 2022-10-11 DIAGNOSIS — M545 Low back pain, unspecified: Secondary | ICD-10-CM

## 2022-10-11 DIAGNOSIS — G894 Chronic pain syndrome: Secondary | ICD-10-CM

## 2022-10-11 MED ORDER — TIZANIDINE HCL 4 MG PO TABS: 4.0000 mg | ORAL_TABLET | Freq: Two times a day (BID) | ORAL | 3 refills | Status: AC | PRN

## 2022-10-11 NOTE — Progress Notes (Signed)
SUBJECTIVE:   Chief Complaint  Patient presents with   Transitions Of Care    refill   HPI Patient presents to clinic for transfer of care.  Hyperlipidemia Takes Lipitor 40 mg at night.  Tolerating medication well.  Currently on keto diet, CHO 10 g, fat 93 g, protein 89 g.  Trying to go carbohydrate free.  Has lost significant weight since doing keto diet.  Recent LDL 113 from 09/22.  Mood disorder Long-term use of Wellbutrin XL 300 mg daily.  Tolerating medication well.  Denies any SI/HI.  Previously seen Dr. Lalla Brothers and therapist.  Chronic pain Follows with pain management.  Currently on Celebrex 200 mg daily, PPI, Phenergan 25 mg as needed Zanaflex 4 mg nightly for which she needs refill, fentanyl patch 12 mg every 3 days, hydrocodone 10-325 mg.  PERTINENT PMH / PSH: Hyperlipidemia Chronic pain Mood disorder   OBJECTIVE:  BP 115/78   Pulse 75   Temp 97.9 F (36.6 C) (Oral)   Ht 5\' 3"  (1.6 m)   Wt 136 lb 9.6 oz (62 kg)   SpO2 97%   BMI 24.20 kg/m    Physical Exam Vitals reviewed.  Constitutional:      General: She is not in acute distress.    Appearance: She is not ill-appearing.  HENT:     Head: Normocephalic.     Nose: Nose normal.  Eyes:     Conjunctiva/sclera: Conjunctivae normal.  Neck:     Thyroid: No thyromegaly or thyroid tenderness.  Cardiovascular:     Rate and Rhythm: Normal rate and regular rhythm.     Heart sounds: Normal heart sounds.  Pulmonary:     Effort: Pulmonary effort is normal.     Breath sounds: Normal breath sounds.  Abdominal:     General: Abdomen is flat. Bowel sounds are normal.     Palpations: Abdomen is soft.  Musculoskeletal:        General: Normal range of motion.     Cervical back: Normal range of motion.  Neurological:     Mental Status: She is alert and oriented to person, place, and time. Mental status is at baseline.  Psychiatric:        Mood and Affect: Mood normal.        Behavior: Behavior normal.         Thought Content: Thought content normal.        Judgment: Judgment normal.     ASSESSMENT/PLAN:  Mood disorder (HCC) Assessment & Plan: Chronic.  Tolerating current medication.  Denies any SI/HI.  PHQ 9 0 Continue Wellbutrin XL 300 mg daily Encouraged CBT Follow-up as needed.  Orders: -     Hemoglobin A1c -     CBC with Differential/Platelet -     TSH -     VITAMIN D 25 Hydroxy (Vit-D Deficiency, Fractures) -     Vitamin B12  Cervicalgia -     tiZANidine HCl; Take 1 tablet (4 mg total) by mouth 3 times/day as needed-between meals & bedtime for muscle spasms.  Dispense: 30 tablet; Refill: 3  Hyperlipidemia, unspecified hyperlipidemia type Assessment & Plan: Chronic.  On statin therapy and tolerating well.  No myalgias. Continue atorvastatin 40 mg nightly Recommend monitoring keto diet given high fat content.  Orders: -     Lipid panel  Need for DTP, Hib conjugate and hepatitis B vaccine -     Heplisav-B (HepB-CPG) Vaccine  Gastroesophageal reflux disease without esophagitis Assessment & Plan: Chronic.  Doing well on PPI Continue Prilosec 20 mg daily   Chronic pain syndrome Assessment & Plan: Chronic neck, joint, low back pain, followed by pain management. Currently on opioid therapy, fentanyl patch 12 mg every 3 days and Norco 10-325 mg for breakthrough. Also takes Zanaflex 4 mg nightly for muscle spasms. Refill Zanaflex  Orders: -     Comprehensive metabolic panel  Chronic, continuous use of opioids Assessment & Plan: Follows with pain management   Hormone replacement therapy Assessment & Plan: Chronic.  Currently on [place] patch. Follows with Dr. Kenton Kingfisher.  GYN   HCM Colonoscopy up-to-date.  Follows with GI.  Due 03/25. Pap due 02/25.  Follows with OB/GYN. Mammogram due 5/25 Tdap up-to-date Shingles up-to-date. Received third dose of Hep B today.   PDMP reviewed  Return in about 3 months (around 01/11/2023) for PCP.  Carollee Leitz, MD

## 2022-10-11 NOTE — Patient Instructions (Addendum)
It was a pleasure meeting you today. Thank you for allowing me to take part in your health care.  Our goals for today as we discussed include:  Refill sent for Zanaflex  You received your hepatitis B vaccine today. Please schedule RN appointment for 1 month to receive your second hepatitis B vaccine.   Schedule annual visit in 3 months.   If you have any questions or concerns, please do not hesitate to call the office at [phone].  I look forward to our next visit and until then take care and stay safe.  Regards,   Carollee Leitz, MD   [facility]

## 2022-10-12 ENCOUNTER — Encounter: Payer: Self-pay | Admitting: Physical Therapy

## 2022-10-12 ENCOUNTER — Ambulatory Visit: Payer: Managed Care, Other (non HMO) | Admitting: Physical Therapy

## 2022-10-12 DIAGNOSIS — M6281 Muscle weakness (generalized): Secondary | ICD-10-CM

## 2022-10-12 DIAGNOSIS — M25551 Pain in right hip: Secondary | ICD-10-CM

## 2022-10-12 DIAGNOSIS — R262 Difficulty in walking, not elsewhere classified: Secondary | ICD-10-CM

## 2022-10-12 NOTE — Therapy (Signed)
OUTPATIENT PHYSICAL THERAPY TREATMENT NOTE    Patient Name: Carolyn Esparza MRN: [id number] DOB:1966-04-07, 57 y.o., female Today's Date: 10/12/2022  PCP: Carollee Leitz, MD REFERRING PROVIDER: Charlcie Cradle, MD  END OF SESSION:   PT End of Session - 10/12/22 1633     Visit Number 11    Number of Visits 24    Date for PT Re-Evaluation 12/27/22    Authorization Type [employer] reporting period from 10/04/2022    Authorization Time Period VL: 76 per calendar year    Authorization - Visit Number 11    Authorization - Number of Visits 60    Progress Note Due on Visit 20    PT Start Time 1600    PT Stop Time 1640    PT Time Calculation (min) 40 min    Activity Tolerance Patient tolerated treatment well;Patient limited by fatigue;Patient limited by pain    Behavior During Therapy [facility] for tasks assessed/performed                 Past Medical History:  Diagnosis Date   Anxiety    ASCUS with positive high risk HPV cervical    CAD (coronary artery disease)    Chicken pox    Chronic pain    neck, back, b/l hips    Chronic pain of right hip [id number]   Complication of anesthesia    DDD (degenerative disc disease), lumbar 01/06/2021   Depression    Facet arthritis of lumbar region 01/06/2021   GERD (gastroesophageal reflux disease)    History of Crohn's disease    Hyperlipidemia    Left ovarian cyst    s/p removal of 1 ovary ? which one removed per pt    Libido, decreased    Skin cancer    reports skin cancer removed from skin in 2016 or 2017   Thyroid nodule    repeat US resolved and Ct neck 05/2019 normal thyroid    Past Surgical History:  Procedure Laterality Date   ABDOMINAL HYSTERECTOMY     APPENDECTOMY  2004   BLADDER SURGERY     BREAST BIOPSY     x 2   COLONOSCOPY     COLONOSCOPY WITH PROPOFOL N/A 10/25/2020   Procedure: COLONOSCOPY WITH PROPOFOL;  Surgeon: Mauri Pole, MD;  Location: [facility];  Service: Endoscopy;  Laterality: N/A;   HEMOSTASIS CLIP  PLACEMENT  10/25/2020   Procedure: HEMOSTASIS CLIP PLACEMENT;  Surgeon: Mauri Pole, MD;  Location: [facility];  Service: Endoscopy;;   OOPHORECTOMY Right    Unilateral Right (per MRI 04/17/2018)   POLYPECTOMY  10/25/2020   Procedure: POLYPECTOMY;  Surgeon: Mauri Pole, MD;  Location: [facility];  Service: Endoscopy;;   SUBMUCOSAL LIFTING INJECTION  10/25/2020   Procedure: SUBMUCOSAL LIFTING INJECTION;  Surgeon: Mauri Pole, MD;  Location: [facility];  Service: Endoscopy;;   WISDOM TOOTH EXTRACTION     Patient Active Problem List   Diagnosis Date Noted   Chronic, continuous use of opioids 11/11/2021   Hormone replacement therapy 11/11/2021   Hip dysplasia, congenital 09/02/2021   Cervicalgia 07/01/2021   Low back pain 07/01/2021   Herpes infection 07/01/2021   Chronic pain of right hip 04/28/2021   DDD (degenerative disc disease), lumbar 01/06/2021   Facet arthritis of lumbar region 01/06/2021   Polyp of cecum 12/15/2020   Trochanteric bursitis of both hips 11/30/2020   Special screening for malignant neoplasms, colon    Pain of both hip joints 04/12/2020  Onychomycosis 04/12/2020   Fibrocystic breast disease (FCBD) 04/12/2020   Spinal stenosis in cervical region 05/09/2019   Facet arthropathy, cervical 05/09/2019   Chronic pain 11/27/2018   Enthesopathy of hip region on both sides 05/10/2018   Tear of right gluteus medius tendon 05/10/2018   Abnormal MRI, cervical spine 03/28/2018   Menopause 11/26/2017   Right upper quadrant pain 11/26/2017   Abnormal Pap smear of cervix 07/27/2017   Vasomotor flushing 07/19/2017   Depression 07/18/2017   Nausea 06/25/2017   Chronic pain of both hips 06/25/2017   OSA (obstructive sleep apnea) 04/18/2017   Annual physical exam 04/10/2017   Fatigue 12/16/2016   Chronic back pain 05/30/2016   Abdominal pain 03/12/2016   Anxiety and depression 12/28/2015   History of obstructive sleep apnea 12/15/2015   GERD  (gastroesophageal reflux disease) 12/15/2015   Hyperlipidemia 12/15/2015   Pain medication agreement signed 03/25/2015   CAD in native artery 02/05/2015   Right supraspinatus tenosynovitis 08/17/2014   FH: colon polyps 03/30/2014   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 female who presents to outpatient physical therapy with a referral for medical diagnosis tear of right gluteus medius tendon. This patient's chief complaints consist of right hip weakness and pain s/p OPEN REPAIR R GLUTEUS MEDIUS gluteal repair 12/05/2021, leading to the following functional deficits: difficulty with usual activities including those that involve ascending/descending stairs, walking more than 5K steps, exercising and being active, going to the beach, walking the dogs, transfers,carrying things, grocery shopping, lifting, heavy cleaning, cooking. 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  Patient denies hx of stroke, seizures, lung problems, diabetes, unexplained weight loss, unexplained changes in bowel or bladder problems, unexplained stumbling or dropping things, osteoporosis, and spinal surgery.    PRECAUTIONS: None official, but "be careful" because he had another patient who went to the weight training at the gym and re-tore it.   SUBJECTIVE:  SUBJECTIVE STATEMENT:  Patient she is tired. She got a Hep-C vaccine yesterday, so she thinks she might be feeling off from that. She was also really feeling bad on Friday and remembered she had been transitioning to [employer] and she took some electrolytes and felt better. Her dogs have been upset because they got a new roof on the house. She did 8K steps yesterday. She states overall she has been getting in more steps and her hip has been doing pretty good.   PAIN:  Are you having pain? NPRS 0/10 R hip no pain.    OBJECTIVE  TODAY'S TREATMENT:  Therapeutic exercise: to centralize symptoms and improve ROM, strength, muscular endurance, and activity tolerance required for successful completion of functional activities.  - NuStep level 1-3 using bilateral upper and lower extremities. Seat/handle setting 6/8. For improved extremity mobility, muscular endurance, and activity tolerance; and to induce the analgesic effect of aerobic exercise, stimulate improved joint nutrition, and prepare body structures and systems for following interventions. x 6  minutes. Average SPM = 60. - reverse lunge with contralateral UE support, 3x10 each side with knee to 4 inch yoga block.  - lateral lunge to SLS, 3x10 each side with B UE support.  - running man with U UE support 3x9-10 each side. Touchdown UE support    Pt required multimodal cuing for proper technique and to facilitate improved neuromuscular control, strength, range of motion, and functional ability resulting in improved performance and form.     PATIENT EDUCATION:  Education details: Exercise purpose/form. Self management techniques. Education on HEP including handout.  Reviewed cancelation/no-show policy with patient and confirmed patient has correct phone number for clinic; patient  verbalized understanding (08/07/22). Person educated: Patient Education method: Explanation, Demonstration, Tactile cues, and Verbal cues Education comprehension: verbalized understanding, returned demonstration, verbal cues required, and needs further education   HOME EXERCISE PROGRAM: Access Code: [email or web address] URL: [email or web address] Date: 10/12/2022 Prepared by: Rosita Kea  Exercises - Runner's Step Up/Down  - 1 x daily - 3 sets - 10 reps - Side stepping with band  - 1 x daily - 2-3 sets - 10-15 feet distance - Reverse Lunge  - 1 x daily - 2-3 sets - 10 reps - Sidelying Hip Abduction  - 1 x daily - 2-3 sets - 8 reps - Standing Hip Abduction with Counter Support  - 1 x daily - 2-3 sets - 10 reps - 2 seconds hold - The Hundred 3 Intermediate - Table Top  - 1 x daily - 5 reps - 20 seconds hold   ASSESSMENT:   CLINICAL IMPRESSION: Patient arrives feeling fatigued but with good tolerance to last PT session and HEP. Continued with functional and LE strengthening and proprioceptive exercises to improve strength and motor control, especially at the R hip. Patient was able to tolerate more exercise volume loading R lateral hip today compared to previous visits. Plan to continue with progressive loading as tolerated next session. Patient would benefit from continued management of limiting condition by skilled physical therapist to address remaining impairments and functional limitations to work towards stated goals and return to PLOF or maximal functional independence.   From Initial Eval 08/07/2021:  Patient is a 57 y.o. female referred to outpatient physical therapy with a medical diagnosis of tear of right gluteus medius tendon who presents with signs and symptoms consistent with chronic R hip pain and weakness s/p R OPEN REPAIR R GLUTEUS MEDIUS gluteal repair on 12/05/2021. Patient presents with significant pain, muscle  performance (strength/power/endurance), motor control, balance,  and activity tolerance impairments that are limiting ability to complete her usual activities including those that involve ascending/descending stairs, walking more than 5K steps, exercising and being active, going to the beach, walking the dogs, transfers,carrying things, grocery shopping, lifting, heavy cleaning, cooking without difficulty. Patient has previously had difficulty tolerating strengthening progressions except when progressed very slowly. Recommend visit frequency of 1x per week as tolerated by patient. Patient will benefit from skilled physical therapy intervention to address current body structure impairments and activity limitations to improve function and work towards goals set in current POC in order to return to prior level of function or maximal functional improvement.      OBJECTIVE IMPAIRMENTS: Abnormal gait, decreased activity tolerance, decreased balance, decreased coordination, decreased endurance, decreased knowledge of condition, decreased mobility, difficulty walking, decreased strength, impaired perceived functional ability, increased muscle spasms, improper body mechanics, and pain.    ACTIVITY LIMITATIONS: carrying, lifting, bending, standing, squatting, sleeping, stairs, and transfers   PARTICIPATION LIMITATIONS: meal prep, cleaning, laundry, interpersonal relationship, shopping, community activity, and   usual activities including those that involve ascending/descending stairs, walking more than 5K steps, exercising and being active, going to the beach, walking the dogs, transfers,carrying things, grocery shopping, lifting, heavy cleaning, cooking   PERSONAL FACTORS: Fitness, Past/current experiences, Time since onset of injury/illness/exacerbation, and 3+ comorbidities:   chronic back pain, chronic neck pain, hypermobile in several joints, GERD, fatigue, depression/anxiety, CAD (smallest artery in heart - no stent recommended - sees cardiologist no restrictions), skin  cancer (cleared up a few years ago), chronic pain patient, on keto diet for about 1.5 years are also affecting patient's functional outcome.    REHAB POTENTIAL: Good   CLINICAL DECISION MAKING: Evolving/moderate complexity   EVALUATION COMPLEXITY: Moderate     GOALS: Goals reviewed with patient? No   SHORT TERM GOALS: Target date: 08/22/2022   Patient will be independent with initial home exercise program for self-management of symptoms. Baseline: Initial HEP provided at IE (08/07/22); Goal status: MET     LONG TERM GOALS: Target date: 10/30/2022. Target date updated to 12/27/2022 for all unmet long term goals on 10/04/2022.    Patient will be independent with a long-term home exercise program for self-management of symptoms.  Baseline: Initial HEP provided at IE (08/07/22); participating regularly (10/04/2022);  Goal status: In-progress   2.  Patient will demonstrate improved FOTO to equal or greater than 57 by visit #15 to demonstrate improvement in overall condition and self-reported functional ability.  Baseline: 37 (08/07/22); 51 at visit #5 (08/31/2022); 54 at visit # 10 (10/04/2022);  Goal status: In-progress   3.  Patient will complete 1x10 consecutive single leg runner's step up on R LE to 8 inch or higher step with no UE support or increase in pain to demonstrate improved strength and balance to climb stairs.  Baseline: pain with single step up to 6 inch step (08/07/22); 1x10 to 8 inch step with no pain or trendelenburg (but some wobbling, 3//2024);  Goal status: MET   4.  Patient will balance equal or greater than 60 seconds on R single leg stance without trendelenburg to demonstrate improved functional strength of right lateral hip for improved walking.  Baseline: 25 seconds with trendelenburg (08/07/22); > 60 seconds with trendelenburg and fatigue symptoms (10/04/2022);  Goal status: In-progress   5.  Patient will complete community, work and/or recreational activities without  limitation due to current condition.  Baseline: difficulty with usual activities including those that  involve ascending/descending stairs, walking more than 5K steps, exercising and being active, going to the beach, walking the dogs, transfers,carrying things, grocery shopping, lifting, heavy cleaning, cooking (08/07/22); estimates 75% improvement since starting PT this episode of care (3//2024):  Goal status: In-progress     PLAN:   PT FREQUENCY: 1-2x/week   PT DURATION: 12 weeks   PLANNED INTERVENTIONS: Therapeutic exercises, Therapeutic activity, Neuromuscular re-education, Balance training, Gait training, Patient/Family education, Self Care, Joint mobilization, Stair training, DME instructions, Dry Needling, Electrical stimulation, Spinal mobilization, Cryotherapy, Moist heat, Manual therapy, and Re-evaluation   PLAN FOR NEXT SESSION: gently progressive strengthening focusing on R hip and functional strength, balance, manual therapy as needed.      Nancy Nordmann, PT, DPT 10/12/2022, 4:42 PM   [facility] [address] [place], [place] [address] P: [phone] I F: [phone]

## 2022-10-15 ENCOUNTER — Encounter: Payer: Self-pay | Admitting: Family Medicine

## 2022-10-15 DIAGNOSIS — Z23 Encounter for immunization: Secondary | ICD-10-CM | POA: Insufficient documentation

## 2022-10-15 NOTE — Assessment & Plan Note (Signed)
Follows with pain management

## 2022-10-15 NOTE — Assessment & Plan Note (Signed)
Chronic.  Tolerating current medication.  Denies any SI/HI.  PHQ 9 0 Continue Wellbutrin XL 300 mg daily Encouraged CBT Follow-up as needed.

## 2022-10-15 NOTE — Assessment & Plan Note (Signed)
Chronic neck, joint, low back pain, followed by pain management. Currently on opioid therapy, fentanyl patch 12 mg every 3 days and Norco 10-325 mg for breakthrough. Also takes Zanaflex 4 mg nightly for muscle spasms. Refill Zanaflex

## 2022-10-15 NOTE — Assessment & Plan Note (Signed)
Chronic.  Currently on Climara patch. Follows with Dr. Kenton Kingfisher.  GYN

## 2022-10-15 NOTE — Assessment & Plan Note (Signed)
Chronic.  Doing well on PPI Continue Prilosec 20 mg daily

## 2022-10-15 NOTE — Assessment & Plan Note (Signed)
Chronic.  On statin therapy and tolerating well.  No myalgias. Continue atorvastatin 40 mg nightly Recommend monitoring keto diet given high fat content.

## 2022-10-18 ENCOUNTER — Ambulatory Visit: Payer: Managed Care, Other (non HMO) | Admitting: Physical Therapy

## 2022-10-18 ENCOUNTER — Encounter: Payer: Self-pay | Admitting: Physical Therapy

## 2022-10-18 DIAGNOSIS — M25551 Pain in right hip: Secondary | ICD-10-CM

## 2022-10-18 DIAGNOSIS — R262 Difficulty in walking, not elsewhere classified: Secondary | ICD-10-CM

## 2022-10-18 DIAGNOSIS — M6281 Muscle weakness (generalized): Secondary | ICD-10-CM

## 2022-10-18 NOTE — Therapy (Signed)
OUTPATIENT PHYSICAL THERAPY TREATMENT NOTE    Patient Name: Carolyn Esparza MRN: [id number] DOB:1966-06-05, 57 y.o., female Today's Date: 10/18/2022  PCP: Carollee Leitz, MD REFERRING PROVIDER: Charlcie Cradle, MD  END OF SESSION:   PT End of Session - 10/18/22 1436     Visit Number 12    Number of Visits 24    Date for PT Re-Evaluation 12/27/22    Authorization Type [employer] reporting period from 10/04/2022    Authorization Time Period VL: 35 per calendar year    Authorization - Visit Number 12    Authorization - Number of Visits 60    Progress Note Due on Visit 20    PT Start Time 1433    PT Stop Time 1511    PT Time Calculation (min) 38 min    Activity Tolerance Patient tolerated treatment well;Patient limited by fatigue;Patient limited by pain    Behavior During Therapy [facility] for tasks assessed/performed             Past Medical History:  Diagnosis Date   Abdominal pain 03/12/2016   Abnormal MRI, cervical spine 03/28/2018   Annual physical exam 04/10/2017   Anxiety    ASCUS with positive high risk HPV cervical    CAD (coronary artery disease)    Cervicalgia 07/01/2021   Chicken pox    Chronic back pain 05/30/2016   Chronic pain    neck, back, b/l hips    Chronic pain of both hips 06/25/2017   Chronic pain of right hip 04/28/2021   Chronic pain of right hip [id number]   Complication of anesthesia    DDD (degenerative disc disease), lumbar 01/06/2021   Depression    Facet arthritis of lumbar region 01/06/2021   Fatigue 12/16/2016   Fibrocystic breast disease (FCBD) 04/12/2020   GERD (gastroesophageal reflux disease)    Herpes infection 07/01/2021   History of Crohn's disease    History of obstructive sleep apnea 12/15/2015   History of after significant weight gain.   Not a current issue.    Hyperlipidemia    Left ovarian cyst    s/p removal of 1 ovary ? which one removed per pt    Libido, decreased    Low back pain 07/01/2021   Menopause 11/26/2017    Nausea 06/25/2017   Onychomycosis 04/12/2020   Pain in joint, pelvic region and thigh 07/12/2012   Pain of both hip joints 04/12/2020   Right supraspinatus tenosynovitis 08/17/2014   Right upper quadrant pain 11/26/2017   Skin cancer    reports skin cancer removed from skin in 2016 or 2017   Spinal stenosis in cervical region 05/09/2019   Tear of right gluteus medius tendon 05/10/2018   Thyroid nodule    repeat US resolved and Ct neck 05/2019 normal thyroid    Vasomotor flushing 07/19/2017   Past Surgical History:  Procedure Laterality Date   ABDOMINAL HYSTERECTOMY     APPENDECTOMY  2004   BLADDER SURGERY     BREAST BIOPSY     x 2   COLONOSCOPY     COLONOSCOPY WITH PROPOFOL N/A 10/25/2020   Procedure: COLONOSCOPY WITH PROPOFOL;  Surgeon: Mauri Pole, MD;  Location: [facility];  Service: Endoscopy;  Laterality: N/A;   HEMOSTASIS CLIP PLACEMENT  10/25/2020   Procedure: HEMOSTASIS CLIP PLACEMENT;  Surgeon: Mauri Pole, MD;  Location: [facility];  Service: Endoscopy;;   OOPHORECTOMY Right    Unilateral Right (per MRI 04/17/2018)   POLYPECTOMY  10/25/2020   Procedure:  POLYPECTOMY;  Surgeon: Mauri Pole, MD;  Location: [facility];  Service: Endoscopy;;   SUBMUCOSAL LIFTING INJECTION  10/25/2020   Procedure: SUBMUCOSAL LIFTING INJECTION;  Surgeon: Mauri Pole, MD;  Location: [facility];  Service: Endoscopy;;   WISDOM TOOTH EXTRACTION     Patient Active Problem List   Diagnosis Date Noted   Need for DTP, Hib conjugate and hepatitis B vaccine 10/15/2022   Chronic, continuous use of opioids 11/11/2021   Hormone replacement therapy 11/11/2021   Hip dysplasia, congenital 09/02/2021   DDD (degenerative disc disease), lumbar 01/06/2021   Facet arthritis of lumbar region 01/06/2021   Polyp of cecum 12/15/2020   Trochanteric bursitis of both hips 11/30/2020   Special screening for malignant neoplasms, colon    Facet arthropathy, cervical 05/09/2019    Chronic pain 11/27/2018   Enthesopathy of hip region on both sides 05/10/2018   Abnormal Pap smear of cervix 07/27/2017   Mood disorder ([place]) 07/18/2017   OSA (obstructive sleep apnea) 04/18/2017   Anxiety and depression 12/28/2015   GERD (gastroesophageal reflux disease) 12/15/2015   Hyperlipidemia 12/15/2015   Pain medication agreement signed 03/25/2015   CAD in native artery 02/05/2015   FH: colon polyps 03/30/2014   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 female who presents to outpatient physical therapy with a referral for medical diagnosis tear of right gluteus medius tendon. This patient's chief complaints consist of right hip weakness and pain s/p OPEN REPAIR R GLUTEUS MEDIUS gluteal repair 12/05/2021, leading to the following functional deficits: difficulty with usual activities including those that involve ascending/descending stairs, walking more than 5K steps, exercising and being active, going to the beach, walking the dogs, transfers,carrying things, grocery shopping, lifting, heavy cleaning, cooking. 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  Patient denies hx of stroke, seizures, lung problems, diabetes, unexplained weight loss, unexplained changes in bowel or bladder problems, unexplained stumbling or dropping things, osteoporosis, and spinal surgery.    PRECAUTIONS: None official, but "be careful" because he had  another patient who went to the weight training at the gym and re-tore it.   SUBJECTIVE:  SUBJECTIVE STATEMENT:  Patient states she is feeling grumpy for no apparent reason. She states she was sore after last PT session but not too bad. She walked too far on Sunday, over 10K steps and her R hip was sore. She doesn't have pain today. She walked the dogs a couple of days. She thinks she can walk half a mile now without issues. She has done most of her HEP but not lunges.   PAIN:  Are you having pain? NPRS 0/10 R hip.    OBJECTIVE  TODAY'S TREATMENT:  Therapeutic exercise: to centralize symptoms and improve ROM, strength, muscular endurance, and activity tolerance required for successful completion of functional activities.  - NuStep level 3 using bilateral upper and lower extremities. Seat/handle setting 6/8. For improved extremity mobility, muscular endurance, and activity tolerance; and to induce the analgesic effect of aerobic exercise, stimulate improved joint nutrition, and prepare body structures and systems for following interventions. x 6  minutes. Average SPM = 65. - reverse lunge with contralateral UE support, 3x10 each side with knee to airex pad.   - lateral step over small aerobic step (6 inches high), 1x10 each way AROM, 2x10 each way with 3#AW on each ankle.  - running man with U UE touchdown  support 3x5-10 each side. Touchdown UE support (limited tolerance R SLS).  - forwards step down, 1x10 each foot off 6 inch step with U UE support.    Pt required multimodal cuing for proper technique and to facilitate improved neuromuscular control, strength, range of motion, and functional ability resulting in improved performance and form.     PATIENT EDUCATION:  Education details: Exercise purpose/form. Self  management techniques. Education on HEP including handout.  Reviewed cancelation/no-show policy with patient and confirmed patient has correct phone number for clinic; patient verbalized understanding (08/07/22). Person educated: Patient Education method: Explanation, Demonstration, Tactile cues, and Verbal cues Education comprehension: verbalized understanding, returned demonstration, verbal cues required, and needs further education   HOME EXERCISE PROGRAM: Access Code: [email or web address] URL: [email or web address] Date: 10/12/2022 Prepared by: Rosita Kea  Exercises - Runner's Step Up/Down  - 1 x daily - 3 sets - 10 reps - Side stepping with band  - 1 x daily - 2-3 sets - 10-15 feet distance - Reverse Lunge  - 1 x daily - 2-3 sets - 10 reps - Sidelying Hip Abduction  - 1 x daily - 2-3 sets - 8 reps - Standing Hip Abduction with Counter Support  - 1 x daily - 2-3 sets - 10 reps - 2 seconds hold - The Hundred 3 Intermediate - Table Top  - 1 x daily - 5 reps - 20 seconds hold   ASSESSMENT:   CLINICAL IMPRESSION: Patient arrives with no pain and fairly good participation in HEP. She continued with LE/core/functional strengthening focusing on improving R hip strength for return to function. She required rest breaks and  minimal cuing for correct form. She was able to progress load/intensity mildly but was limited by pain/fatigue at R hip. Patient would benefit from continued management of limiting condition by skilled physical therapist to address remaining impairments and functional limitations to work towards stated goals and return to PLOF or maximal functional independence.   From Initial Eval 08/07/2021:  Patient is a 57 y.o. female referred to outpatient physical therapy with a medical diagnosis of tear of right gluteus medius tendon who presents with signs and symptoms consistent with chronic R hip pain and weakness s/p R OPEN REPAIR R GLUTEUS MEDIUS  gluteal repair on 12/05/2021.  Patient presents with significant pain, muscle performance (strength/power/endurance), motor control, balance, and activity tolerance impairments that are limiting ability to complete her usual activities including those that involve ascending/descending stairs, walking more than 5K steps, exercising and being active, going to the beach, walking the dogs, transfers,carrying things, grocery shopping, lifting, heavy cleaning, cooking without difficulty. Patient has previously had difficulty tolerating strengthening progressions except when progressed very slowly. Recommend visit frequency of 1x per week as tolerated by patient. Patient will benefit from skilled physical therapy intervention to address current body structure impairments and activity limitations to improve function and work towards goals set in current POC in order to return to prior level of function or maximal functional improvement.      OBJECTIVE IMPAIRMENTS: Abnormal gait, decreased activity tolerance, decreased balance, decreased coordination, decreased endurance, decreased knowledge of condition, decreased mobility, difficulty walking, decreased strength, impaired perceived functional ability, increased muscle spasms, improper body mechanics, and pain.    ACTIVITY LIMITATIONS: carrying, lifting, bending, standing, squatting, sleeping, stairs, and transfers   PARTICIPATION LIMITATIONS: meal prep, cleaning, laundry, interpersonal relationship, shopping, community activity, and   usual activities including those that involve ascending/descending stairs, walking more than 5K steps, exercising and being active, going to the beach, walking the dogs, transfers,carrying things, grocery shopping, lifting, heavy cleaning, cooking   PERSONAL FACTORS: Fitness, Past/current experiences, Time since onset of injury/illness/exacerbation, and 3+ comorbidities:   chronic back pain, chronic neck pain, hypermobile in several joints, GERD, fatigue,  depression/anxiety, CAD (smallest artery in heart - no stent recommended - sees cardiologist no restrictions), skin cancer (cleared up a few years ago), chronic pain patient, on keto diet for about 1.5 years are also affecting patient's functional outcome.    REHAB POTENTIAL: Good   CLINICAL DECISION MAKING: Evolving/moderate complexity   EVALUATION COMPLEXITY: Moderate     GOALS: Goals reviewed with patient? No   SHORT TERM GOALS: Target date: 08/22/2022   Patient will be independent with initial home exercise program for self-management of symptoms. Baseline: Initial HEP provided at IE (08/07/22); Goal status: MET     LONG TERM GOALS: Target date: 10/30/2022. Target date updated to 12/27/2022 for all unmet long term goals on 10/04/2022.    Patient will be independent with a long-term home exercise program for self-management of symptoms.  Baseline: Initial HEP provided at [facility] (08/07/22); participating regularly (10/04/2022);  Goal status: In-progress   2.  Patient will demonstrate improved FOTO to equal or greater than 57 by visit #15 to demonstrate improvement in overall condition and self-reported functional ability.  Baseline: 37 (08/07/22); 51 at visit #5 (08/31/2022); 54 at visit # 10 (10/04/2022);  Goal status: In-progress   3.  Patient will complete 1x10 consecutive single leg runner's step up on R LE to 8 inch or higher step with no UE support or increase in pain to demonstrate improved strength and balance to climb stairs.  Baseline: pain with single step up to 6 inch step (08/07/22); 1x10 to 8 inch step with no pain or trendelenburg (but some wobbling, 3//2024);  Goal status: MET   4.  Patient will balance equal or greater than 60 seconds on R single leg stance without trendelenburg to demonstrate improved functional strength of right lateral hip for improved walking.  Baseline: 25 seconds with trendelenburg (08/07/22); > 60 seconds with trendelenburg and fatigue symptoms (10/04/2022);   Goal status: In-progress   5.  Patient will complete community, work and/or recreational activities without limitation due to current  condition.  Baseline: difficulty with usual activities including those that involve ascending/descending stairs, walking more than 5K steps, exercising and being active, going to the beach, walking the dogs, transfers,carrying things, grocery shopping, lifting, heavy cleaning, cooking (08/07/22); estimates 75% improvement since starting PT this episode of care (3//2024):  Goal status: In-progress     PLAN:   PT FREQUENCY: 1-2x/week   PT DURATION: 12 weeks   PLANNED INTERVENTIONS: Therapeutic exercises, Therapeutic activity, Neuromuscular re-education, Balance training, Gait training, Patient/Family education, Self Care, Joint mobilization, Stair training, DME instructions, Dry Needling, Electrical stimulation, Spinal mobilization, Cryotherapy, Moist heat, Manual therapy, and Re-evaluation   PLAN FOR NEXT SESSION: gently progressive strengthening focusing on R hip and functional strength, balance, manual therapy as needed.    Nancy Nordmann, PT, DPT 10/18/2022, 3:11 PM   [facility] [id number] [place], [place] [id number] P: [phone] I F: [phone]

## 2022-10-25 ENCOUNTER — Ambulatory Visit: Payer: Managed Care, Other (non HMO) | Admitting: Physical Therapy

## 2022-11-02 ENCOUNTER — Encounter: Payer: Self-pay | Admitting: Physical Therapy

## 2022-11-02 ENCOUNTER — Ambulatory Visit: Payer: Managed Care, Other (non HMO) | Attending: Orthopedic Surgery | Admitting: Physical Therapy

## 2022-11-02 DIAGNOSIS — R262 Difficulty in walking, not elsewhere classified: Secondary | ICD-10-CM | POA: Diagnosis present

## 2022-11-02 DIAGNOSIS — M25551 Pain in right hip: Secondary | ICD-10-CM | POA: Insufficient documentation

## 2022-11-02 DIAGNOSIS — M6281 Muscle weakness (generalized): Secondary | ICD-10-CM | POA: Insufficient documentation

## 2022-11-02 NOTE — Therapy (Signed)
OUTPATIENT PHYSICAL THERAPY TREATMENT NOTE / DISCHARGE SUMMARY Dates of reporting from 08/07/2022 to 11/02/2022   Patient Name: Carolyn Esparza MRN: [id number] DOB:1965-10-11, 57 y.o., female Today's Date: 11/02/2022  PCP: Carollee Leitz, MD REFERRING PROVIDER: Charlcie Cradle, MD  END OF SESSION:   PT End of Session - 11/02/22 0907     Visit Number 13    Number of Visits 24    Date for PT Re-Evaluation 12/27/22    Authorization Type [employer] reporting period from 10/04/2022    Authorization Time Period VL: 92 per calendar year    Authorization - Visit Number 13    Authorization - Number of Visits 60    Progress Note Due on Visit 20    PT Start Time 0903    PT Stop Time 0941    PT Time Calculation (min) 38 min    Activity Tolerance Patient tolerated treatment well;Patient limited by fatigue;Patient limited by pain    Behavior During Therapy [facility] for tasks assessed/performed              Past Medical History:  Diagnosis Date   Abdominal pain 03/12/2016   Abnormal MRI, cervical spine 03/28/2018   Annual physical exam 04/10/2017   Anxiety    ASCUS with positive high risk HPV cervical    CAD (coronary artery disease)    Cervicalgia 07/01/2021   Chicken pox    Chronic back pain 05/30/2016   Chronic pain    neck, back, b/l hips    Chronic pain of both hips 06/25/2017   Chronic pain of right hip 04/28/2021   Chronic pain of right hip [id number]   Complication of anesthesia    DDD (degenerative disc disease), lumbar 01/06/2021   Depression    Facet arthritis of lumbar region 01/06/2021   Fatigue 12/16/2016   Fibrocystic breast disease (FCBD) 04/12/2020   GERD (gastroesophageal reflux disease)    Herpes infection 07/01/2021   History of Crohn's disease    History of obstructive sleep apnea 12/15/2015   History of after significant weight gain.   Not a current issue.    Hyperlipidemia    Left ovarian cyst    s/p removal of 1 ovary ? which one removed per pt    Libido,  decreased    Low back pain 07/01/2021   Menopause 11/26/2017   Nausea 06/25/2017   Onychomycosis 04/12/2020   Pain in joint, pelvic region and thigh 07/12/2012   Pain of both hip joints 04/12/2020   Right supraspinatus tenosynovitis 08/17/2014   Right upper quadrant pain 11/26/2017   Skin cancer    reports skin cancer removed from skin in 2016 or 2017   Spinal stenosis in cervical region 05/09/2019   Tear of right gluteus medius tendon 05/10/2018   Thyroid nodule    repeat US resolved and Ct neck 05/2019 normal thyroid    Vasomotor flushing 07/19/2017   Past Surgical History:  Procedure Laterality Date   ABDOMINAL HYSTERECTOMY     APPENDECTOMY  2004   BLADDER SURGERY     BREAST BIOPSY     x 2   COLONOSCOPY     COLONOSCOPY WITH PROPOFOL N/A 10/25/2020   Procedure: COLONOSCOPY WITH PROPOFOL;  Surgeon: Mauri Pole, MD;  Location: [facility];  Service: Endoscopy;  Laterality: N/A;   HEMOSTASIS CLIP PLACEMENT  10/25/2020   Procedure: HEMOSTASIS CLIP PLACEMENT;  Surgeon: Mauri Pole, MD;  Location: [facility];  Service: Endoscopy;;   OOPHORECTOMY Right    Unilateral Right (per  MRI 04/17/2018)   POLYPECTOMY  10/25/2020   Procedure: POLYPECTOMY;  Surgeon: Mauri Pole, MD;  Location: [facility];  Service: Endoscopy;;   SUBMUCOSAL LIFTING INJECTION  10/25/2020   Procedure: SUBMUCOSAL LIFTING INJECTION;  Surgeon: Mauri Pole, MD;  Location: [facility];  Service: Endoscopy;;   WISDOM TOOTH EXTRACTION     Patient Active Problem List   Diagnosis Date Noted   Need for DTP, Hib conjugate and hepatitis B vaccine 10/15/2022   Chronic, continuous use of opioids 11/11/2021   Hormone replacement therapy 11/11/2021   Hip dysplasia, congenital 09/02/2021   DDD (degenerative disc disease), lumbar 01/06/2021   Facet arthritis of lumbar region 01/06/2021   Polyp of cecum 12/15/2020   Trochanteric bursitis of both hips 11/30/2020   Special screening for  malignant neoplasms, colon    Facet arthropathy, cervical 05/09/2019   Chronic pain 11/27/2018   Enthesopathy of hip region on both sides 05/10/2018   Abnormal Pap smear of cervix 07/27/2017   Mood disorder 07/18/2017   OSA (obstructive sleep apnea) 04/18/2017   Anxiety and depression 12/28/2015   GERD (gastroesophageal reflux disease) 12/15/2015   Hyperlipidemia 12/15/2015   Pain medication agreement signed 03/25/2015   CAD in native artery 02/05/2015   FH: colon polyps 03/30/2014   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 female who presents to outpatient physical therapy with a referral for medical diagnosis tear of right gluteus medius tendon. This patient's chief complaints consist of right hip weakness and pain s/p OPEN REPAIR R GLUTEUS MEDIUS gluteal repair 12/05/2021, leading to the following functional deficits: difficulty with usual activities including those that involve ascending/descending stairs, walking more than 5K steps, exercising and being active, going to the beach, walking the dogs, transfers,carrying things, grocery shopping, lifting, heavy cleaning, cooking. 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  Patient denies hx of stroke, seizures, lung problems, diabetes, unexplained weight loss, unexplained changes in bowel or bladder problems, unexplained stumbling or dropping things, osteoporosis, and spinal surgery.     PRECAUTIONS: None official, but "be careful" because he had another patient who went to the weight training at the gym and re-tore it.   SUBJECTIVE:  SUBJECTIVE STATEMENT:  Patient states she missed last PT session last week due to not feeling well. She is feeling better this week and not sleeping well this week, but her neck and right shoulder is hurting today. Her R hip has been doing well and she is only getting some achiness in her R hip only after she has had a long day of activity. She took Caryl Pina out to [facility] and putt putt this weekend and did well with a few rests to help keep her hip from getting too tired. She also completed some stairs without a problem. She would like to discharge to a long term HEP today.   PAIN:  Are you having pain? NPRS 0/10 R hip. 7/10 neck and R shoulder.    OBJECTIVE  SELF-REPORTED FUNCTION FOTO score: 69/100 (hip questionnaire)  FUNCTIONAL/BALANCE TESTS Single leg stance:  L > 60 seconds without trendelenburg; R = 41 seconds with mild trendelenburg with limited by hip fatigue (11/02/2022);    Runner's step up: 1x10 to 8 inch step with no pain or trendelenburg.   TODAY'S TREATMENT:  Therapeutic exercise: to centralize symptoms and improve ROM, strength, muscular endurance, and activity tolerance required for successful completion of functional activities.  - NuStep level 3 using bilateral upper and lower extremities. Seat/handle setting 6/8. For improved extremity mobility, muscular endurance, and activity tolerance; and to induce the analgesic effect of aerobic exercise, stimulate improved joint nutrition, and prepare body structures and systems for following interventions. x 9:18  minutes. Average SPM = 66. - testing to assess progress at discharge (see above).  - review  of HEP - running man with U UE touchdown  support 3x6-10 each side. Touchdown UE support (limited tolerance R SLS).  - forwards step down, 1x10 each foot off 6 inch step with no UE support.    Pt required multimodal cuing for proper technique and to facilitate improved neuromuscular control, strength, range of motion, and functional ability resulting in improved performance and form.     PATIENT EDUCATION:  Education details: Exercise purpose/form. Self management techniques. Education on HEP including handout. Discharge recommendations.  Reviewed cancelation/no-show policy with patient and confirmed patient has correct phone number for clinic; patient verbalized understanding (08/07/22). Person educated: Patient Education method: Explanation, Demonstration, Tactile cues, and Verbal cues Education comprehension: verbalized understanding, returned demonstration, verbal cues required, and needs further education   HOME EXERCISE PROGRAM: Access Code: [email or web address] URL: [email or web address] Date: 11/02/2022 Prepared by: Rosita Kea  Program Notes complete 3 times a week for maintanance and more often if you see yourself regressing or you want to keep progression.   Exercises - Runner's Step Up/Down  - 1 x daily - 3 sets - 10 reps - Side stepping with band  - 1 x daily - 2-3 sets - 10-15 feet distance - Reverse Lunge  - 1 x daily - 2-3 sets - 10 reps - Sidelying Hip Abduction  - 1 x daily - 2-3 sets - 8 reps - The Hundred 3 Intermediate - Table Top  - 1 x daily - 5 reps - 20 seconds hold - Single Leg Running Balance  - 3 x weekly - 3 sets - 5-10 reps   ASSESSMENT:   CLINICAL IMPRESSION: Patient has attended 13 physical therapy sessions since starting PT on 08/07/2022. She has made good progress towards goals and has requested discharge due to improvement in function. She has been educated in long term HEP for continued intendant management. Patient is now discharged from PT  due  to satisfactory progress towards goals and improvement in condition.      OBJECTIVE IMPAIRMENTS: Abnormal gait, decreased activity tolerance, decreased balance, decreased coordination, decreased endurance, decreased knowledge of condition, decreased mobility, difficulty walking, decreased strength, impaired perceived functional ability, increased muscle spasms, improper body mechanics, and pain.    ACTIVITY LIMITATIONS: carrying, lifting, bending, standing, squatting, sleeping, stairs, and transfers   PARTICIPATION LIMITATIONS: meal prep, cleaning, laundry, interpersonal relationship, shopping, community activity, and   usual activities including those that involve ascending/descending stairs, walking more than 5K steps, exercising and being active, going to the beach, walking the dogs, transfers,carrying things, grocery shopping, lifting, heavy cleaning, cooking   PERSONAL FACTORS: Fitness, Past/current experiences, Time since onset of injury/illness/exacerbation, and 3+ comorbidities:   chronic back pain, chronic neck pain, hypermobile in several joints, GERD, fatigue, depression/anxiety, CAD (smallest artery in heart - no stent recommended - sees cardiologist no restrictions), skin cancer (cleared up a few years ago), chronic pain patient, on keto diet for about 1.5 years are also affecting patient's functional outcome.    REHAB POTENTIAL: Good   CLINICAL DECISION MAKING: Evolving/moderate complexity   EVALUATION COMPLEXITY: Moderate     GOALS: Goals reviewed with patient? No   SHORT TERM GOALS: Target date: 08/22/2022   Patient will be independent with initial home exercise program for self-management of symptoms. Baseline: Initial HEP provided at [facility] (08/07/22); Goal status: MET     LONG TERM GOALS: Target date: 10/30/2022. Target date updated to 12/27/2022 for all unmet long term goals on 10/04/2022.    Patient will be independent with a long-term home exercise program for  self-management of symptoms.  Baseline: Initial HEP provided at [facility] (08/07/22); participating regularly (10/04/2022);  Goal status: MET   2.  Patient will demonstrate improved FOTO to equal or greater than 57 by visit #15 to demonstrate improvement in overall condition and self-reported functional ability.  Baseline: 37 (08/07/22); 51 at visit #5 (08/31/2022); 54 at visit # 10 (10/04/2022); 69 at visit # 13 (11/02/2022);  Goal status: MET   3.  Patient will complete 1x10 consecutive single leg runner's step up on R LE to 8 inch or higher step with no UE support or increase in pain to demonstrate improved strength and balance to climb stairs.  Baseline: pain with single step up to 6 inch step (08/07/22); 1x10 to 8 inch step with no pain or trendelenburg (but some wobbling, 3//2024); 1x10 to 8 inch step with no pain or trendelenburg (11/02/2022);  Goal status: MET   4.  Patient will balance equal or greater than 60 seconds on R single leg stance without trendelenburg to demonstrate improved functional strength of right lateral hip for improved walking.  Baseline: 25 seconds with trendelenburg (08/07/22); > 60 seconds with trendelenburg and fatigue symptoms (10/04/2022); > 60 seconds with mild trendelenburg and fatigue (10/04/2022); 41 seconds with mild trendelenburg and limited by fatigue (11/02/2022);  Goal status: partially met   5.  Patient will complete community, work and/or recreational activities without limitation due to current condition.  Baseline: difficulty with usual activities including those that involve ascending/descending stairs, walking more than 5K steps, exercising and being active, going to the beach, walking the dogs, transfers,carrying things, grocery shopping, lifting, heavy cleaning, cooking (08/07/22); estimates 75% improvement since starting PT this episode of care (3//2024): patient estimates 70% improvement since starting PT (11/02/2022);  Goal status: partially met.      PLAN:   PT  FREQUENCY: 1-2x/week   PT DURATION: 12  weeks   PLANNED INTERVENTIONS: Therapeutic exercises, Therapeutic activity, Neuromuscular re-education, Balance training, Gait training, Patient/Family education, Self Care, Joint mobilization, Stair training, DME instructions, Dry Needling, Electrical stimulation, Spinal mobilization, Cryotherapy, Moist heat, Manual therapy, and Re-evaluation   PLAN FOR NEXT SESSION: Patient is now discharged from PT due to improvement in symptoms.    Nancy Nordmann, PT, DPT 11/02/2022, 9:43 AM   [facility] [address] [place], [place] [address] P: [phone] I F: [phone]

## 2022-11-08 ENCOUNTER — Ambulatory Visit: Payer: Managed Care, Other (non HMO) | Admitting: Physical Therapy

## 2022-11-13 ENCOUNTER — Ambulatory Visit: Payer: Managed Care, Other (non HMO)

## 2022-11-15 ENCOUNTER — Encounter: Payer: Managed Care, Other (non HMO) | Admitting: Physical Therapy

## 2022-11-23 ENCOUNTER — Encounter: Payer: Managed Care, Other (non HMO) | Admitting: Physical Therapy

## 2022-12-14 ENCOUNTER — Other Ambulatory Visit: Payer: Self-pay | Admitting: Family Medicine

## 2022-12-14 DIAGNOSIS — Z0000 Encounter for general adult medical examination without abnormal findings: Secondary | ICD-10-CM

## 2022-12-19 ENCOUNTER — Ambulatory Visit
Admission: RE | Admit: 2022-12-19 | Discharge: 2022-12-19 | Disposition: A | Discharge status: Home / Self Care | Payer: Managed Care, Other (non HMO) | Source: Ambulatory Visit | Attending: Family Medicine

## 2022-12-19 DIAGNOSIS — Z0000 Encounter for general adult medical examination without abnormal findings: Secondary | ICD-10-CM

## 2022-12-21 ENCOUNTER — Other Ambulatory Visit: Payer: Self-pay | Admitting: Family Medicine

## 2022-12-21 DIAGNOSIS — R928 Other abnormal and inconclusive findings on diagnostic imaging of breast: Secondary | ICD-10-CM

## 2022-12-22 ENCOUNTER — Other Ambulatory Visit: Payer: Self-pay | Admitting: Family Medicine

## 2022-12-22 DIAGNOSIS — R928 Other abnormal and inconclusive findings on diagnostic imaging of breast: Secondary | ICD-10-CM

## 2022-12-27 ENCOUNTER — Encounter: Payer: Self-pay | Admitting: Family Medicine

## 2022-12-27 DIAGNOSIS — R110 Nausea: Secondary | ICD-10-CM

## 2022-12-27 MED ORDER — PROMETHAZINE HCL 25 MG PO TABS
25.0000 mg | ORAL_TABLET | Freq: Every day | ORAL | 5 refills | Status: AC | PRN
Start: 2022-12-27 — End: ?

## 2022-12-29 LAB — CBC WITH DIFFERENTIAL/PLATELET
Basophils Absolute: 0 10*3/uL (ref 0.0–0.2)
Basos: 0 %
EOS (ABSOLUTE): 0 10*3/uL (ref 0.0–0.4)
Eos: 1 %
Hematocrit: 41.1 % (ref 34.0–46.6)
Hemoglobin: 13.5 g/dL (ref 11.1–15.9)
Immature Grans (Abs): 0 10*3/uL (ref 0.0–0.1)
Immature Granulocytes: 0 %
Lymphocytes Absolute: 1.3 10*3/uL (ref 0.7–3.1)
Lymphs: 30 %
MCH: 31 pg (ref 26.6–33.0)
MCHC: 32.8 g/dL (ref 31.5–35.7)
MCV: 94 fL (ref 79–97)
Monocytes Absolute: 0.5 10*3/uL (ref 0.1–0.9)
Monocytes: 10 %
Neutrophils Absolute: 2.7 10*3/uL (ref 1.4–7.0)
Neutrophils: 59 %
Platelets: 303 10*3/uL (ref 150–450)
RBC: 4.36 x10E6/uL (ref 3.77–5.28)
RDW: 12.4 % (ref 11.7–15.4)
WBC: 4.5 10*3/uL (ref 3.4–10.8)

## 2022-12-29 LAB — COMPREHENSIVE METABOLIC PANEL
ALT: 12 IU/L (ref 0–32)
AST: 19 IU/L (ref 0–40)
Albumin/Globulin Ratio: 1.9 (ref 1.2–2.2)
Albumin: 4.4 g/dL (ref 3.8–4.9)
Alkaline Phosphatase: 59 IU/L (ref 44–121)
BUN/Creatinine Ratio: 31 — ABNORMAL HIGH (ref 9–23)
BUN: 24 mg/dL (ref 6–24)
Bilirubin Total: 0.4 mg/dL (ref 0.0–1.2)
CO2: 23 mmol/L (ref 20–29)
Calcium: 9.4 mg/dL (ref 8.7–10.2)
Chloride: 101 mmol/L (ref 96–106)
Creatinine, Ser: 0.78 mg/dL (ref 0.57–1.00)
Globulin, Total: 2.3 g/dL (ref 1.5–4.5)
Glucose: 90 mg/dL (ref 70–99)
Potassium: 4.4 mmol/L (ref 3.5–5.2)
Sodium: 141 mmol/L (ref 134–144)
Total Protein: 6.7 g/dL (ref 6.0–8.5)
eGFR: 89 mL/min/{1.73_m2} (ref >59)

## 2022-12-29 LAB — HEMOGLOBIN A1C
Est. average glucose Bld gHb Est-mCnc: 108 mg/dL
Hgb A1c MFr Bld: 5.4 % (ref 4.8–5.6)

## 2022-12-29 LAB — LIPID PANEL
Chol/HDL Ratio: 4.6 ratio — ABNORMAL HIGH (ref 0.0–4.4)
Cholesterol, Total: 246 mg/dL — ABNORMAL HIGH (ref 100–199)
HDL: 54 mg/dL (ref >39)
LDL Chol Calc (NIH): 175 mg/dL — ABNORMAL HIGH (ref 0–99)
Triglycerides: 99 mg/dL (ref 0–149)
VLDL Cholesterol Cal: 17 mg/dL (ref 5–40)

## 2022-12-29 LAB — TSH: TSH: 4.61 u[IU]/mL — ABNORMAL HIGH (ref 0.450–4.500)

## 2022-12-29 LAB — VITAMIN B12: Vitamin B-12: 593 pg/mL (ref 232–1245)

## 2022-12-29 LAB — VITAMIN D 25 HYDROXY (VIT D DEFICIENCY, FRACTURES): Vit D, 25-Hydroxy: 61.9 ng/mL (ref 30.0–100.0)

## 2023-01-08 ENCOUNTER — Ambulatory Visit
Admission: RE | Admit: 2023-01-08 | Discharge: 2023-01-08 | Disposition: A | Discharge status: Home / Self Care | Payer: Managed Care, Other (non HMO) | Source: Ambulatory Visit | Attending: Family Medicine | Admitting: Family Medicine

## 2023-01-08 DIAGNOSIS — R928 Other abnormal and inconclusive findings on diagnostic imaging of breast: Secondary | ICD-10-CM

## 2023-01-09 ENCOUNTER — Encounter: Payer: Self-pay | Admitting: Family Medicine

## 2023-01-11 ENCOUNTER — Ambulatory Visit (INDEPENDENT_AMBULATORY_CARE_PROVIDER_SITE_OTHER): Payer: Managed Care, Other (non HMO) | Admitting: Family Medicine

## 2023-01-11 ENCOUNTER — Encounter: Payer: Self-pay | Admitting: Family Medicine

## 2023-01-11 VITALS — BP 108/64 | HR 72 | Temp 98.4°F | Ht 63.0 in | Wt 127.4 lb

## 2023-01-11 DIAGNOSIS — U071 COVID-19: Secondary | ICD-10-CM | POA: Diagnosis not present

## 2023-01-11 DIAGNOSIS — R7989 Other specified abnormal findings of blood chemistry: Secondary | ICD-10-CM

## 2023-01-11 DIAGNOSIS — R051 Acute cough: Secondary | ICD-10-CM

## 2023-01-11 DIAGNOSIS — E785 Hyperlipidemia, unspecified: Secondary | ICD-10-CM | POA: Diagnosis not present

## 2023-01-11 DIAGNOSIS — F39 Unspecified mood [affective] disorder: Secondary | ICD-10-CM | POA: Diagnosis not present

## 2023-01-11 DIAGNOSIS — K219 Gastro-esophageal reflux disease without esophagitis: Secondary | ICD-10-CM

## 2023-01-11 LAB — POC COVID19 BINAXNOW: SARS Coronavirus 2 Ag: POSITIVE — AB

## 2023-01-11 MED ORDER — BUPROPION HCL ER (XL) 300 MG PO TB24: ORAL_TABLET | ORAL | 3 refills | Status: DC

## 2023-01-11 MED ORDER — OMEPRAZOLE 20 MG PO CPDR: 20.0000 mg | DELAYED_RELEASE_CAPSULE | Freq: Every day | ORAL | 3 refills | Status: DC

## 2023-01-11 NOTE — Progress Notes (Signed)
Pt is aware.  

## 2023-01-11 NOTE — Patient Instructions (Signed)
It was a pleasure meeting you today. Thank you for allowing me to take part in your health care.  Our goals for today as we discussed include:  Symptomatic management for fever, muscle aches and headaches  -Tylenol 325-500 mg every 6 hours as needed -Ibuprofen 200 mg every 8 hours as needed  Stay well hydrated Rest as needed with frequent repositioning and ambulation.  Increase activity as soon as tolerated to help with recovery.  Continue wearing masks, hand washing and self isolation until symptom free.  If you have worsening symptoms, especially difficulty breathing please call 911 or have someone take you to the emergency department.   Schedule annual visit in November.  Will recheck you blood at that time as well.   If you have any questions or concerns, please do not hesitate to call the office at [phone].  I look forward to our next visit and until then take care and stay safe.  Regards,   Dana Allan, MD   [facility]

## 2023-01-11 NOTE — Progress Notes (Unsigned)
   SUBJECTIVE:   Chief Complaint  Patient presents with   Annual Exam   HPI Patient presents for annual physical  Concern for symptoms  PERTINENT PMH / PSH: ***  OBJECTIVE:  BP 108/64   Pulse 72   Temp 98.4 F (36.9 C) (Oral)   Ht 5\' 3"  (1.6 m)   Wt 127 lb 6.4 oz (57.8 kg)   SpO2 96%   BMI 22.57 kg/m    Physical Exam Constitutional:      General: She is not in acute distress.    Appearance: She is obese. She is not ill-appearing.  HENT:     Head: Normocephalic.  Eyes:     Conjunctiva/sclera: Conjunctivae normal.  Neck:     Thyroid: No thyromegaly or thyroid tenderness.  Cardiovascular:     Rate and Rhythm: Normal rate and regular rhythm.     Pulses: Normal pulses.  Pulmonary:     Effort: Pulmonary effort is normal.     Breath sounds: No wheezing or rhonchi.  Abdominal:     General: Bowel sounds are normal.  Neurological:     Mental Status: She is alert. Mental status is at baseline.  Psychiatric:        Mood and Affect: Mood normal.        Behavior: Behavior normal.        Thought Content: Thought content normal.        Judgment: Judgment normal.     ASSESSMENT/PLAN:  Hyperlipidemia, unspecified hyperlipidemia type -     Lipid panel; Future -     LDL cholesterol, direct; Future -     Comprehensive metabolic panel; Future  Gastroesophageal reflux disease without esophagitis -     Omeprazole; Take 1 capsule (20 mg total) by mouth daily. In am before food 30 minutes  Dispense: 90 capsule; Refill: 3  Mood disorder (HCC) -     buPROPion HCl ER (XL); TAKE 1 TABLET(300 MG) BY MOUTH DAILY  Dispense: 90 tablet; Refill: 3 -     TSH; Future  Acute cough -     POC COVID-19 BinaxNow   PDMP reviewed***  No follow-ups on file.  Dana Allan, MD

## 2023-01-21 ENCOUNTER — Encounter: Payer: Self-pay | Admitting: Family Medicine

## 2023-01-21 DIAGNOSIS — R7989 Other specified abnormal findings of blood chemistry: Secondary | ICD-10-CM | POA: Insufficient documentation

## 2023-01-21 DIAGNOSIS — U071 COVID-19: Secondary | ICD-10-CM | POA: Insufficient documentation

## 2023-01-21 NOTE — Assessment & Plan Note (Signed)
Recent TSH elevated 4.61.  Euthyroid.   Repeat TSH at next visit

## 2023-01-21 NOTE — Assessment & Plan Note (Signed)
Chronic.  Recent LDL 175 The 10-year ASCVD risk score (Arnett DK, et al., 2019) is: 2.2%  Takes atorvastatin 40 mg nightly Follows with Cardiology

## 2023-01-21 NOTE — Assessment & Plan Note (Signed)
Chronic.  Tolerating current medication.  Denies any SI/HI.   Refill Wellbutrin XL 300 mg daily Encouraged CBT Follow-up as needed.

## 2023-01-21 NOTE — Assessment & Plan Note (Signed)
New problem.  In no acute respiratory distress.  Hemodynamically stable. O2 sats 96% on room air.  Has had COVID vaccinations. Offered antivirals, patient declined Tylenol 325-500 mg every 6 hours as needed Ibuprofen 200 mg every 8 hours as needed Stay well hydrated Rest as needed with frequent repositioning and ambulation.  Increase activity as soon as tolerated to help with recovery. Continue wearing masks, hand washing and self isolation until symptom free. Strict return to clinic/ED precautions provided Note provided for airlines as patient was schedule for flight to [place] in next couple of days.

## 2023-01-21 NOTE — Assessment & Plan Note (Signed)
Chronic.  Refill Prilosec 20 mg daily

## 2023-02-28 ENCOUNTER — Encounter (INDEPENDENT_AMBULATORY_CARE_PROVIDER_SITE_OTHER): Payer: Self-pay

## 2023-03-22 ENCOUNTER — Encounter: Payer: Self-pay | Admitting: Family Medicine

## 2023-03-22 ENCOUNTER — Ambulatory Visit (INDEPENDENT_AMBULATORY_CARE_PROVIDER_SITE_OTHER): Payer: Managed Care, Other (non HMO) | Admitting: Family Medicine

## 2023-03-22 VITALS — BP 112/66 | HR 75 | Temp 97.9°F | Resp 16 | Ht 63.0 in | Wt 129.1 lb

## 2023-03-22 DIAGNOSIS — K219 Gastro-esophageal reflux disease without esophagitis: Secondary | ICD-10-CM | POA: Diagnosis not present

## 2023-03-22 DIAGNOSIS — F39 Unspecified mood [affective] disorder: Secondary | ICD-10-CM | POA: Diagnosis not present

## 2023-03-22 DIAGNOSIS — G894 Chronic pain syndrome: Secondary | ICD-10-CM

## 2023-03-22 DIAGNOSIS — Z0000 Encounter for general adult medical examination without abnormal findings: Secondary | ICD-10-CM | POA: Diagnosis not present

## 2023-03-22 NOTE — Progress Notes (Signed)
SUBJECTIVE:   Chief Complaint  Patient presents with   Annual Exam   HPI  Patient initially presented for annual physical exam.  Has been followed by pain management clinic for chronic pain Reports has been weaned of Fentanyl patch and Hydrocodone.  Was recommended to continue Tizanidine and Celebrex.  She was also prescribed Clonidine to help with withdrawal.  She thinks that her withdrawal phase has ended.  She reports having significant pain in her lower pelvic area. This has been an ongoing issue for her.  Recommend she follow up with pain management if pain not controlled.  Will refill Prilosec for chronic use of NSAID.  Mood disorder Denies SI/HI.  Doing well on Wellbutrin XL 300 mg daily Not currently following with psychiatry or therapy.  Recommend CBT     PERTINENT PMH / PSH: OSA   OBJECTIVE:  BP 112/66   Pulse 75   Temp 97.9 F (36.6 C)   Resp 16   Ht 5\' 3"  (1.6 m)   Wt 129 lb 2 oz (58.6 kg)   SpO2 95%   BMI 22.87 kg/m    Physical Exam Constitutional:      General: She is not in acute distress.    Appearance: She is obese. She is not ill-appearing.  HENT:     Head: Normocephalic.  Eyes:     Conjunctiva/sclera: Conjunctivae normal.  Neck:     Thyroid: No thyromegaly or thyroid tenderness.  Cardiovascular:     Rate and Rhythm: Normal rate and regular rhythm.     Pulses: Normal pulses.     Heart sounds: Normal heart sounds.  Pulmonary:     Effort: Pulmonary effort is normal.     Breath sounds: Normal breath sounds. No wheezing or rhonchi.  Abdominal:     General: Bowel sounds are normal.  Neurological:     Mental Status: She is alert. Mental status is at baseline.  Psychiatric:        Mood and Affect: Mood normal.        Behavior: Behavior normal.        Thought Content: Thought content normal.        Judgment: Judgment normal.       03/22/2023    1:11 PM 01/11/2023   11:33 AM 10/03/2021    3:07 PM 07/01/2021    3:24 PM 04/09/2020   11:42 AM   Depression screen PHQ 2/9  Decreased Interest 3 0 0 1 0  Down, Depressed, Hopeless 1 0 0 1 0  PHQ - 2 Score 4 0 0 2 0  Altered sleeping 3   2 0  Tired, decreased energy 3   2 0  Change in appetite 0   0 0  Feeling bad or failure about yourself  0   0 0  Trouble concentrating 2   1 0  Moving slowly or fidgety/restless 1   0 0  Suicidal thoughts 0   0 0  PHQ-9 Score 13   7 0  Difficult doing work/chores Extremely dIfficult   Not difficult at all Not difficult at all     ASSESSMENT/PLAN:  Annual physical exam Assessment & Plan: HCM PAP up to date. NILM, HPV negative. Follows with OBGYN.  Due 02/25.   Mammogram Up to date.  Due 06/25.  Order had been placed. Recommend regular self breast exams. Colonoscopy up to date.  3 yr follow up per Napoleon Form, MD.  Due 09/2023 Tetanus up to date Recommend Pneumonia  20  Shingles completed Hepatitis C screening completed Declined HIV screening Dexa at age 36 Normotensive The 10-year ASCVD risk score (Arnett DK, et al., 2019) is: 2.3%   Gastroesophageal reflux disease without esophagitis -     Omeprazole; Take 1 capsule (20 mg total) by mouth daily. In am before food 30 minutes  Dispense: 90 capsule; Refill: 3  Mood disorder (HCC) Assessment & Plan: Chronic.  Tolerating current medication.  Denies any SI/HI.   Continue Wellbutrin XL 300 mg daily Encouraged CBT Mental health resources provided Recommend psychiatry referral Follow-up as needed.   Chronic pain syndrome Assessment & Plan: Chronic neck, joint, low back pain, followed by pain management. Has weaned off opioid therapy, fentanyl patch and Norco 10-325 mg for breakthrough. Zanaflex 4 mg nightly for muscle spasms. Was started on Clonidine 0.1 mg q8h Continues to take Celebrex 200 mg BID.   Refill Prilosec for chronic use of NSAIDS Recommend she continue to follow up with pain management if continuing to have pain.      PDMP reviewed  Return if symptoms  worsen or fail to improve, for PCP.  Dana Allan, MD

## 2023-03-22 NOTE — Patient Instructions (Signed)
It was a pleasure meeting you today. Thank you for allowing me to take part in your health care.  Our goals for today as we discussed include:  [email or web address]  [email or web address]  [facility] [email or web address] [address] [place] [phone]   Can check to see if can schedule appointment with Dr Arti Maryruth Bun   Can look up Pelvic congestion syndrome.   Follow up with PCP as needed   If you have any questions or concerns, please do not hesitate to call the office at [phone].  I look forward to our next visit and until then take care and stay safe.  Regards,   Dana Allan, MD   [facility]

## 2023-04-08 ENCOUNTER — Encounter: Payer: Self-pay | Admitting: Family Medicine

## 2023-04-08 MED ORDER — OMEPRAZOLE 20 MG PO CPDR
20.0000 mg | DELAYED_RELEASE_CAPSULE | Freq: Every day | ORAL | 3 refills | Status: AC
Start: 2023-04-08 — End: ?

## 2023-04-08 NOTE — Assessment & Plan Note (Signed)
HCM PAP up to date. NILM, HPV negative. Follows with OBGYN.  Due 02/25.   Mammogram Up to date.  Due 06/25.  Order had been placed. Recommend regular self breast exams. Colonoscopy up to date.  3 yr follow up per Napoleon Form, MD.  Due 09/2023 Tetanus up to date Recommend Pneumonia 20  Shingles completed Hepatitis C screening completed Declined HIV screening Dexa at age 57 Normotensive The 10-year ASCVD risk score (Arnett DK, et al., 2019) is: 2.3%

## 2023-04-08 NOTE — Assessment & Plan Note (Addendum)
Chronic.  Tolerating current medication.  Denies any SI/HI.   Continue Wellbutrin XL 300 mg daily Encouraged CBT Mental health resources provided Recommend psychiatry referral Follow-up as needed.

## 2023-04-08 NOTE — Assessment & Plan Note (Signed)
Chronic neck, joint, low back pain, followed by pain management. Has weaned off opioid therapy, fentanyl patch and Norco 10-325 mg for breakthrough. Zanaflex 4 mg nightly for muscle spasms. Was started on Clonidine 0.1 mg q8h Continues to take Celebrex 200 mg BID.   Refill Prilosec for chronic use of NSAIDS Recommend she continue to follow up with pain management if continuing to have pain.

## 2023-11-11 ENCOUNTER — Other Ambulatory Visit: Payer: Self-pay | Admitting: Family Medicine

## 2023-11-11 DIAGNOSIS — M542 Cervicalgia: Secondary | ICD-10-CM

## 2023-11-11 DIAGNOSIS — M25552 Pain in left hip: Secondary | ICD-10-CM

## 2024-01-06 ENCOUNTER — Other Ambulatory Visit: Payer: Self-pay | Admitting: Family Medicine

## 2024-01-06 DIAGNOSIS — F39 Unspecified mood [affective] disorder: Secondary | ICD-10-CM
